# Patient Record
Sex: Male | Born: 1945 | Race: Black or African American | Hispanic: No | Marital: Married | Smoking: Former smoker
Health system: Southern US, Community
[De-identification: ages and names within clinical notes are randomized; demographics above are authoritative.]

## PROBLEM LIST (undated history)

## (undated) DIAGNOSIS — I219 Acute myocardial infarction, unspecified: Secondary | ICD-10-CM

## (undated) DIAGNOSIS — E119 Type 2 diabetes mellitus without complications: Secondary | ICD-10-CM

## (undated) DIAGNOSIS — N189 Chronic kidney disease, unspecified: Secondary | ICD-10-CM

## (undated) DIAGNOSIS — I10 Essential (primary) hypertension: Secondary | ICD-10-CM

## (undated) DIAGNOSIS — N400 Enlarged prostate without lower urinary tract symptoms: Secondary | ICD-10-CM

## (undated) DIAGNOSIS — E1110 Type 2 diabetes mellitus with ketoacidosis without coma: Secondary | ICD-10-CM

## (undated) DIAGNOSIS — M1990 Unspecified osteoarthritis, unspecified site: Secondary | ICD-10-CM

## (undated) DIAGNOSIS — J449 Chronic obstructive pulmonary disease, unspecified: Secondary | ICD-10-CM

## (undated) DIAGNOSIS — I2510 Atherosclerotic heart disease of native coronary artery without angina pectoris: Secondary | ICD-10-CM

## (undated) HISTORY — DX: Acute myocardial infarction, unspecified: I21.9

## (undated) HISTORY — DX: Type 2 diabetes mellitus with ketoacidosis without coma: E11.10

## (undated) HISTORY — PX: INGUINAL HERNIA REPAIR: SUR1180

## (undated) HISTORY — PX: CORONARY ARTERY BYPASS GRAFT: SHX141

---

## 1997-10-24 DIAGNOSIS — Q211 Atrial septal defect: Secondary | ICD-10-CM | POA: Insufficient documentation

## 2004-02-05 ENCOUNTER — Emergency Department: Payer: Self-pay | Admitting: Emergency Medicine

## 2006-09-22 ENCOUNTER — Emergency Department: Payer: Self-pay | Admitting: Emergency Medicine

## 2008-09-11 ENCOUNTER — Emergency Department: Payer: Self-pay | Admitting: Internal Medicine

## 2012-08-26 DIAGNOSIS — I2510 Atherosclerotic heart disease of native coronary artery without angina pectoris: Secondary | ICD-10-CM | POA: Insufficient documentation

## 2012-08-26 DIAGNOSIS — I10 Essential (primary) hypertension: Secondary | ICD-10-CM | POA: Insufficient documentation

## 2012-08-26 DIAGNOSIS — I152 Hypertension secondary to endocrine disorders: Secondary | ICD-10-CM | POA: Insufficient documentation

## 2012-08-26 DIAGNOSIS — E1159 Type 2 diabetes mellitus with other circulatory complications: Secondary | ICD-10-CM | POA: Insufficient documentation

## 2014-05-16 DIAGNOSIS — I2584 Coronary atherosclerosis due to calcified coronary lesion: Secondary | ICD-10-CM | POA: Diagnosis not present

## 2014-05-16 DIAGNOSIS — E1129 Type 2 diabetes mellitus with other diabetic kidney complication: Secondary | ICD-10-CM | POA: Diagnosis not present

## 2014-05-16 DIAGNOSIS — I10 Essential (primary) hypertension: Secondary | ICD-10-CM | POA: Diagnosis not present

## 2014-05-16 DIAGNOSIS — I2510 Atherosclerotic heart disease of native coronary artery without angina pectoris: Secondary | ICD-10-CM | POA: Diagnosis not present

## 2014-05-16 DIAGNOSIS — J440 Chronic obstructive pulmonary disease with acute lower respiratory infection: Secondary | ICD-10-CM | POA: Diagnosis not present

## 2014-06-13 DIAGNOSIS — I10 Essential (primary) hypertension: Secondary | ICD-10-CM | POA: Diagnosis not present

## 2014-08-15 DIAGNOSIS — E785 Hyperlipidemia, unspecified: Secondary | ICD-10-CM | POA: Diagnosis not present

## 2014-08-15 DIAGNOSIS — I2510 Atherosclerotic heart disease of native coronary artery without angina pectoris: Secondary | ICD-10-CM | POA: Diagnosis not present

## 2014-08-15 DIAGNOSIS — Z794 Long term (current) use of insulin: Secondary | ICD-10-CM | POA: Diagnosis not present

## 2014-08-15 DIAGNOSIS — E1122 Type 2 diabetes mellitus with diabetic chronic kidney disease: Secondary | ICD-10-CM | POA: Diagnosis not present

## 2014-08-15 DIAGNOSIS — J449 Chronic obstructive pulmonary disease, unspecified: Secondary | ICD-10-CM | POA: Diagnosis not present

## 2014-08-15 DIAGNOSIS — N182 Chronic kidney disease, stage 2 (mild): Secondary | ICD-10-CM | POA: Diagnosis not present

## 2014-08-15 DIAGNOSIS — Z23 Encounter for immunization: Secondary | ICD-10-CM | POA: Diagnosis not present

## 2014-08-15 DIAGNOSIS — E1129 Type 2 diabetes mellitus with other diabetic kidney complication: Secondary | ICD-10-CM | POA: Diagnosis not present

## 2014-08-15 DIAGNOSIS — E1165 Type 2 diabetes mellitus with hyperglycemia: Secondary | ICD-10-CM | POA: Diagnosis not present

## 2014-08-15 DIAGNOSIS — N400 Enlarged prostate without lower urinary tract symptoms: Secondary | ICD-10-CM | POA: Diagnosis not present

## 2014-08-15 DIAGNOSIS — I10 Essential (primary) hypertension: Secondary | ICD-10-CM | POA: Diagnosis not present

## 2014-11-21 DIAGNOSIS — E1140 Type 2 diabetes mellitus with diabetic neuropathy, unspecified: Secondary | ICD-10-CM | POA: Diagnosis not present

## 2014-11-21 DIAGNOSIS — E119 Type 2 diabetes mellitus without complications: Secondary | ICD-10-CM | POA: Diagnosis not present

## 2014-11-21 DIAGNOSIS — Z79899 Other long term (current) drug therapy: Secondary | ICD-10-CM | POA: Diagnosis not present

## 2014-11-21 DIAGNOSIS — Z23 Encounter for immunization: Secondary | ICD-10-CM | POA: Diagnosis not present

## 2014-11-21 DIAGNOSIS — I10 Essential (primary) hypertension: Secondary | ICD-10-CM | POA: Diagnosis not present

## 2015-04-09 DIAGNOSIS — J329 Chronic sinusitis, unspecified: Secondary | ICD-10-CM | POA: Diagnosis not present

## 2015-04-09 DIAGNOSIS — M47819 Spondylosis without myelopathy or radiculopathy, site unspecified: Secondary | ICD-10-CM | POA: Diagnosis not present

## 2015-04-09 DIAGNOSIS — R197 Diarrhea, unspecified: Secondary | ICD-10-CM | POA: Diagnosis not present

## 2015-04-09 DIAGNOSIS — K760 Fatty (change of) liver, not elsewhere classified: Secondary | ICD-10-CM | POA: Diagnosis not present

## 2015-04-09 DIAGNOSIS — N3289 Other specified disorders of bladder: Secondary | ICD-10-CM | POA: Diagnosis not present

## 2015-04-09 DIAGNOSIS — R109 Unspecified abdominal pain: Secondary | ICD-10-CM | POA: Diagnosis not present

## 2015-04-09 DIAGNOSIS — N390 Urinary tract infection, site not specified: Secondary | ICD-10-CM | POA: Diagnosis not present

## 2015-04-09 DIAGNOSIS — K5730 Diverticulosis of large intestine without perforation or abscess without bleeding: Secondary | ICD-10-CM | POA: Diagnosis not present

## 2015-04-09 DIAGNOSIS — N2889 Other specified disorders of kidney and ureter: Secondary | ICD-10-CM | POA: Diagnosis not present

## 2015-04-24 DIAGNOSIS — N3090 Cystitis, unspecified without hematuria: Secondary | ICD-10-CM | POA: Diagnosis not present

## 2015-04-24 DIAGNOSIS — R238 Other skin changes: Secondary | ICD-10-CM | POA: Diagnosis not present

## 2015-04-24 DIAGNOSIS — E1141 Type 2 diabetes mellitus with diabetic mononeuropathy: Secondary | ICD-10-CM | POA: Diagnosis not present

## 2015-04-24 DIAGNOSIS — I10 Essential (primary) hypertension: Secondary | ICD-10-CM | POA: Diagnosis not present

## 2015-04-24 DIAGNOSIS — Z794 Long term (current) use of insulin: Secondary | ICD-10-CM | POA: Diagnosis not present

## 2015-04-24 DIAGNOSIS — N401 Enlarged prostate with lower urinary tract symptoms: Secondary | ICD-10-CM | POA: Diagnosis not present

## 2015-04-24 DIAGNOSIS — J449 Chronic obstructive pulmonary disease, unspecified: Secondary | ICD-10-CM | POA: Diagnosis not present

## 2015-04-24 DIAGNOSIS — R413 Other amnesia: Secondary | ICD-10-CM | POA: Diagnosis not present

## 2015-04-24 DIAGNOSIS — J42 Unspecified chronic bronchitis: Secondary | ICD-10-CM | POA: Diagnosis not present

## 2015-04-24 DIAGNOSIS — R202 Paresthesia of skin: Secondary | ICD-10-CM | POA: Diagnosis not present

## 2015-04-24 DIAGNOSIS — R339 Retention of urine, unspecified: Secondary | ICD-10-CM | POA: Diagnosis not present

## 2015-06-29 ENCOUNTER — Inpatient Hospital Stay
Admission: EM | Admit: 2015-06-29 | Discharge: 2015-07-02 | DRG: 872 | Disposition: A | Discharge status: Home / Self Care | Payer: Commercial Managed Care - HMO | Attending: Internal Medicine | Admitting: Internal Medicine

## 2015-06-29 ENCOUNTER — Encounter: Payer: Self-pay | Admitting: Emergency Medicine

## 2015-06-29 ENCOUNTER — Emergency Department: Payer: Commercial Managed Care - HMO

## 2015-06-29 DIAGNOSIS — N183 Chronic kidney disease, stage 3 (moderate): Secondary | ICD-10-CM | POA: Diagnosis present

## 2015-06-29 DIAGNOSIS — A419 Sepsis, unspecified organism: Secondary | ICD-10-CM | POA: Diagnosis present

## 2015-06-29 DIAGNOSIS — E1165 Type 2 diabetes mellitus with hyperglycemia: Secondary | ICD-10-CM | POA: Diagnosis not present

## 2015-06-29 DIAGNOSIS — N400 Enlarged prostate without lower urinary tract symptoms: Secondary | ICD-10-CM | POA: Diagnosis present

## 2015-06-29 DIAGNOSIS — N390 Urinary tract infection, site not specified: Secondary | ICD-10-CM | POA: Diagnosis not present

## 2015-06-29 DIAGNOSIS — N179 Acute kidney failure, unspecified: Secondary | ICD-10-CM | POA: Diagnosis present

## 2015-06-29 DIAGNOSIS — Z87891 Personal history of nicotine dependence: Secondary | ICD-10-CM

## 2015-06-29 DIAGNOSIS — Z7982 Long term (current) use of aspirin: Secondary | ICD-10-CM

## 2015-06-29 DIAGNOSIS — R739 Hyperglycemia, unspecified: Secondary | ICD-10-CM | POA: Diagnosis not present

## 2015-06-29 DIAGNOSIS — I2510 Atherosclerotic heart disease of native coronary artery without angina pectoris: Secondary | ICD-10-CM | POA: Diagnosis present

## 2015-06-29 DIAGNOSIS — Z951 Presence of aortocoronary bypass graft: Secondary | ICD-10-CM | POA: Diagnosis not present

## 2015-06-29 DIAGNOSIS — I129 Hypertensive chronic kidney disease with stage 1 through stage 4 chronic kidney disease, or unspecified chronic kidney disease: Secondary | ICD-10-CM | POA: Diagnosis not present

## 2015-06-29 DIAGNOSIS — E871 Hypo-osmolality and hyponatremia: Secondary | ICD-10-CM | POA: Diagnosis present

## 2015-06-29 DIAGNOSIS — Z8249 Family history of ischemic heart disease and other diseases of the circulatory system: Secondary | ICD-10-CM

## 2015-06-29 DIAGNOSIS — J449 Chronic obstructive pulmonary disease, unspecified: Secondary | ICD-10-CM | POA: Diagnosis not present

## 2015-06-29 DIAGNOSIS — E860 Dehydration: Secondary | ICD-10-CM | POA: Diagnosis present

## 2015-06-29 DIAGNOSIS — R531 Weakness: Secondary | ICD-10-CM | POA: Diagnosis not present

## 2015-06-29 DIAGNOSIS — R7881 Bacteremia: Secondary | ICD-10-CM | POA: Diagnosis present

## 2015-06-29 DIAGNOSIS — N3000 Acute cystitis without hematuria: Secondary | ICD-10-CM | POA: Diagnosis not present

## 2015-06-29 DIAGNOSIS — Z794 Long term (current) use of insulin: Secondary | ICD-10-CM | POA: Diagnosis not present

## 2015-06-29 DIAGNOSIS — E1122 Type 2 diabetes mellitus with diabetic chronic kidney disease: Secondary | ICD-10-CM | POA: Diagnosis not present

## 2015-06-29 DIAGNOSIS — A4189 Other specified sepsis: Secondary | ICD-10-CM | POA: Diagnosis not present

## 2015-06-29 DIAGNOSIS — N189 Chronic kidney disease, unspecified: Secondary | ICD-10-CM

## 2015-06-29 DIAGNOSIS — Z79899 Other long term (current) drug therapy: Secondary | ICD-10-CM | POA: Diagnosis not present

## 2015-06-29 HISTORY — DX: Chronic kidney disease, unspecified: N18.9

## 2015-06-29 HISTORY — DX: Atherosclerotic heart disease of native coronary artery without angina pectoris: I25.10

## 2015-06-29 HISTORY — DX: Chronic obstructive pulmonary disease, unspecified: J44.9

## 2015-06-29 HISTORY — DX: Benign prostatic hyperplasia without lower urinary tract symptoms: N40.0

## 2015-06-29 HISTORY — DX: Type 2 diabetes mellitus without complications: E11.9

## 2015-06-29 HISTORY — DX: Essential (primary) hypertension: I10

## 2015-06-29 LAB — CBC WITH DIFFERENTIAL/PLATELET
BASOS PCT: 0 %
Basophils Absolute: 0.1 10*3/uL (ref 0–0.1)
EOS ABS: 0 10*3/uL (ref 0–0.7)
EOS PCT: 0 %
HCT: 36.4 % — ABNORMAL LOW (ref 40.0–52.0)
HEMOGLOBIN: 12.3 g/dL — AB (ref 13.0–18.0)
Lymphocytes Relative: 5 %
Lymphs Abs: 1.2 10*3/uL (ref 1.0–3.6)
MCH: 29.2 pg (ref 26.0–34.0)
MCHC: 33.9 g/dL (ref 32.0–36.0)
MCV: 86.2 fL (ref 80.0–100.0)
Monocytes Absolute: 2 10*3/uL — ABNORMAL HIGH (ref 0.2–1.0)
Monocytes Relative: 8 %
NEUTROS PCT: 87 %
Neutro Abs: 20.3 10*3/uL — ABNORMAL HIGH (ref 1.4–6.5)
PLATELETS: 289 10*3/uL (ref 150–440)
RBC: 4.22 MIL/uL — ABNORMAL LOW (ref 4.40–5.90)
RDW: 13 % (ref 11.5–14.5)
WBC: 23.5 10*3/uL — AB (ref 3.8–10.6)

## 2015-06-29 LAB — BASIC METABOLIC PANEL
ANION GAP: 9 (ref 5–15)
BUN: 51 mg/dL — ABNORMAL HIGH (ref 6–20)
CO2: 23 mmol/L (ref 22–32)
Calcium: 8.3 mg/dL — ABNORMAL LOW (ref 8.9–10.3)
Chloride: 94 mmol/L — ABNORMAL LOW (ref 101–111)
Creatinine, Ser: 2.55 mg/dL — ABNORMAL HIGH (ref 0.61–1.24)
GFR calc Af Amer: 28 mL/min — ABNORMAL LOW (ref >60)
GFR, EST NON AFRICAN AMERICAN: 24 mL/min — AB (ref >60)
GLUCOSE: 410 mg/dL — AB (ref 65–99)
POTASSIUM: 3.9 mmol/L (ref 3.5–5.1)
Sodium: 126 mmol/L — ABNORMAL LOW (ref 135–145)

## 2015-06-29 LAB — URINALYSIS COMPLETE WITH MICROSCOPIC (ARMC ONLY)
Bilirubin Urine: NEGATIVE
Ketones, ur: NEGATIVE mg/dL
NITRITE: NEGATIVE
Protein, ur: NEGATIVE mg/dL
SPECIFIC GRAVITY, URINE: 1.009 (ref 1.005–1.030)
pH: 5 (ref 5.0–8.0)

## 2015-06-29 LAB — TSH: TSH: 0.363 u[IU]/mL (ref 0.350–4.500)

## 2015-06-29 LAB — TROPONIN I: Troponin I: <0.03 ng/mL (ref <0.031)

## 2015-06-29 LAB — LACTIC ACID, PLASMA: LACTIC ACID, VENOUS: 1.5 mmol/L (ref 0.5–2.0)

## 2015-06-29 LAB — CK: Total CK: 605 U/L — ABNORMAL HIGH (ref 49–397)

## 2015-06-29 MED ORDER — ACETAMINOPHEN 325 MG PO TABS
650.0000 mg | ORAL_TABLET | Freq: Four times a day (QID) | ORAL | Status: DC | PRN
  Administered 2015-06-30 – 2015-07-01 (×3): 650 mg via ORAL
  Filled 2015-06-29 (×3): qty 2

## 2015-06-29 MED ORDER — SODIUM CHLORIDE 0.9 % IV BOLUS (SEPSIS)
1000.0000 mL | INTRAVENOUS | Status: DC
  Administered 2015-06-29 – 2015-06-30 (×2): 1000 mL via INTRAVENOUS

## 2015-06-29 MED ORDER — ONDANSETRON HCL 4 MG/2ML IJ SOLN: 4.0000 mg | Freq: Four times a day (QID) | INTRAMUSCULAR | Status: DC | PRN

## 2015-06-29 MED ORDER — ONDANSETRON HCL 4 MG PO TABS: 4.0000 mg | ORAL_TABLET | Freq: Four times a day (QID) | ORAL | Status: DC | PRN

## 2015-06-29 MED ORDER — SODIUM CHLORIDE 0.9 % IV SOLN
INTRAVENOUS | Status: DC
  Administered 2015-06-30 – 2015-07-02 (×6): via INTRAVENOUS

## 2015-06-29 MED ORDER — HEPARIN SODIUM (PORCINE) 5000 UNIT/ML IJ SOLN
5000.0000 [IU] | Freq: Three times a day (TID) | INTRAMUSCULAR | Status: DC
  Administered 2015-06-30 – 2015-07-02 (×8): 5000 [IU] via SUBCUTANEOUS
  Filled 2015-06-29 (×8): qty 1

## 2015-06-29 MED ORDER — SENNOSIDES-DOCUSATE SODIUM 8.6-50 MG PO TABS: 1.0000 | ORAL_TABLET | Freq: Every evening | ORAL | Status: DC | PRN

## 2015-06-29 MED ORDER — DEXTROSE 5 % IV SOLN
2.0000 g | Freq: Once | INTRAVENOUS | Status: AC
  Administered 2015-06-30: 2 g via INTRAVENOUS
  Filled 2015-06-29: qty 2

## 2015-06-29 MED ORDER — SODIUM CHLORIDE 0.9 % IV BOLUS (SEPSIS)
1000.0000 mL | Freq: Once | INTRAVENOUS | Status: AC
  Administered 2015-06-29: 1000 mL via INTRAVENOUS

## 2015-06-29 MED ORDER — SODIUM CHLORIDE 0.9% FLUSH
3.0000 mL | Freq: Two times a day (BID) | INTRAVENOUS | Status: DC
  Administered 2015-06-30 – 2015-07-02 (×3): 3 mL via INTRAVENOUS

## 2015-06-29 MED ORDER — ACETAMINOPHEN 650 MG RE SUPP: 650.0000 mg | Freq: Four times a day (QID) | RECTAL | Status: DC | PRN

## 2015-06-29 NOTE — Progress Notes (Signed)
Pharmacy Antibiotic Note  LEANARD ISHEE is a 70 y.o. male admitted on 06/29/2015 with UTI.  Pharmacy has been consulted for cefepime dosing.  Plan: Cefepime 2 gm IV x 1 in ED followed by cefepime 1 gm IV Q12H.   Height: 5\' 11"  (180.3 cm) Weight: 208 lb 12.4 oz (94.7 kg) IBW/kg (Calculated) : 75.3  Temp (24hrs), Avg:100.2 F (37.9 C), Min:100.2 F (37.9 C), Max:100.2 F (37.9 C)   Recent Labs Lab 06/29/15 2139  WBC 23.5*  CREATININE 2.55*    Estimated Creatinine Clearance: 31.2 mL/min (by C-G formula based on Cr of 2.55).    No Known Allergies  Thank you for allowing pharmacy to be a part of this patient's care.  Carola Frost, Pharm.D., BCPS Clinical Pharmacist 06/29/2015 10:52 PM

## 2015-06-29 NOTE — ED Notes (Signed)
Patient transported to X-ray 

## 2015-06-29 NOTE — ED Notes (Signed)
Pt comes into the ED via EMS c/o generalized weakness. Patient has been mowing all day and only consumes 1 gl;ass of water.  Patient has h/o HTN, DM, COPD.  CBG 364, 80HR, 122/60, 101.9 temp.  Patient denies any chest pain, dizziness, N/V.  Patient has no complaints other than the generalized weakness.

## 2015-06-29 NOTE — ED Provider Notes (Addendum)
[facility] Emergency Department Provider Note  ____________________________________________  Time seen: Seen upon arrival to the emergency department  I have reviewed the triage vital signs and the nursing notes.   HISTORY  Chief Complaint Weakness   HPI Benjamin Chan is a 70 y.o. male with a history of diabetes and hypertension who is presenting to the emergency department with weakness that started today. The patient denies any pain. Says he does have a mild sore throat. Denies any burning with urination but says that he was treated for urinary tract infection about one month ago at [facility] [place].Patient says that it was his wife that insisted that he come to the emergency department today because of his weakness. He also says that he was out mowing the yard all day and only had one glass of water.   Past Medical History  Diagnosis Date  . Diabetes mellitus without complication (HCC)   . Hypertension   . COPD (chronic obstructive pulmonary disease) (HCC)     There are no active problems to display for this patient.   Past Surgical History  Procedure Laterality Date  . Coronary artery bypass graft    . Inguinal hernia repair      right    No current outpatient prescriptions on file.  Allergies Review of patient's allergies indicates no known allergies.  No family history on file.  Social History Social History  Substance Use Topics  . Smoking status: Former Games developer  . Smokeless tobacco: None  . Alcohol Use: No    Review of Systems Constitutional: No fever/chills Eyes: No visual changes. ENT: No sore throat. Cardiovascular: Denies chest pain. Respiratory: Denies shortness of breath. Gastrointestinal: No abdominal pain.  No nausea, no vomiting.  No diarrhea.  No constipation. Genitourinary: Negative for dysuria. Musculoskeletal: Negative for back pain. Skin: Negative for rash. Neurological: Negative for headaches,  focal weakness or numbness.  10-point ROS otherwise negative.  ____________________________________________   PHYSICAL EXAM:  VITAL SIGNS: ED Triage Vitals  Enc Vitals Group     BP 06/29/15 2143 123/73 mmHg     Pulse Rate 06/29/15 2136 83     Resp 06/29/15 2136 17     Temp 06/29/15 2136 100.2 F (37.9 C)     Temp Source 06/29/15 2136 Oral     SpO2 06/29/15 2136 94 %     Weight 06/29/15 2136 208 lb 12.4 oz (94.7 kg)     Height 06/29/15 2136 5\' 11"  (1.803 m)     Head Cir --      Peak Flow --      Pain Score --      Pain Loc --      Pain Edu? --      Excl. in GC? --     Constitutional: Alert and oriented. Well appearing and in no acute distress. Eyes: Conjunctivae are normal. PERRL. EOMI. Head: Atraumatic. Nose: No congestion/rhinnorhea. Mouth/Throat: Mucous membranes are moist.  Oropharynx non-erythematous. Neck: No stridor.   Cardiovascular: Normal rate, regular rhythm. Grossly normal heart sounds.  Good peripheral circulation. Respiratory: Normal respiratory effort.  No retractions. Lungs CTAB. Gastrointestinal: Soft and nontender. No distention. No abdominal bruits. No CVA tenderness. Musculoskeletal: No lower extremity tenderness nor edema.  No joint effusions. Neurologic:  Normal speech and language. No gross focal neurologic deficits are appreciated. Skin:  Skin is warm, dry and intact. No rash noted. Psychiatric: Mood and affect are normal. Speech and behavior are normal.  ____________________________________________   LABS (  all labs ordered are listed, but only abnormal results are displayed)  Labs Reviewed  CBC WITH DIFFERENTIAL/PLATELET - Abnormal; Notable for the following:    WBC 23.5 (*)    RBC 4.22 (*)    Hemoglobin 12.3 (*)    HCT 36.4 (*)    Neutro Abs 20.3 (*)    Monocytes Absolute 2.0 (*)    All other components within normal limits  BASIC METABOLIC PANEL - Abnormal; Notable for the following:    Sodium 126 (*)    Chloride 94 (*)    Glucose,  Bld 410 (*)    BUN 51 (*)    Creatinine, Ser 2.55 (*)    Calcium 8.3 (*)    GFR calc non Af Amer 24 (*)    GFR calc Af Amer 28 (*)    All other components within normal limits  CK - Abnormal; Notable for the following:    Total CK 605 (*)    All other components within normal limits  URINALYSIS COMPLETEWITH MICROSCOPIC ([facility] ONLY) - Abnormal; Notable for the following:    Color, Urine YELLOW (*)    APPearance HAZY (*)    Glucose, UA >500 (*)    Hgb urine dipstick 2+ (*)    Leukocytes, UA 3+ (*)    Bacteria, UA MANY (*)    Squamous Epithelial / LPF 0-5 (*)    All other components within normal limits  CULTURE, BLOOD (ROUTINE X 2)  CULTURE, BLOOD (ROUTINE X 2)  URINE CULTURE  TROPONIN I  TSH  LACTIC ACID, PLASMA  LACTIC ACID, PLASMA   ____________________________________________  EKG  ED ECG REPORT I, Schaevitz,  Teena Irani, the attending physician, personally viewed and interpreted this ECG.   Date: 06/29/2015  EKG Time: 2139  Rate: 82  Rhythm: normal sinus rhythm  Axis: Normal axis  Intervals:Incomplete left bundle branch block.  ST&T Change: No ST segment elevation or depression. No abnormal T-wave inversion. Machine read as ST elevations in V1 through V4 but this is likely secondary to the baseline.  ____________________________________________  RADIOLOGY   IMPRESSION: No active cardiopulmonary disease.   Electronically Signed By: Ellery Plunk M.D. On: 06/29/2015 22:22 ____________________________________________   PROCEDURES  ____________________________________________   INITIAL IMPRESSION / ASSESSMENT AND PLAN / ED COURSE  Pertinent labs & imaging results that were available during my care of the patient were reviewed by me and considered in my medical decision making (see chart for details).  ----------------------------------------- 11:03 PM on 06/29/2015 -----------------------------------------  Patient with urine consistent with  infection. Patient with likely sepsis secondary to his UTI. Sepsis alert called. Patient to be admitted to the hospital. I discussed the plan as well as the diagnosis with the family as well as the patient was understanding willing to comply. I also discussed the patient's acute renal failure. ____________________________________________   FINAL CLINICAL IMPRESSION(S) / ED DIAGNOSES  UTI. Sepsis. Acute renal failure. Hyponatremia.    Myrna Blazer, MD 06/29/15 [phone]  Signed out to Dr. Tobi Bastos.    Myrna Blazer, MD 06/29/15 [phone]

## 2015-06-29 NOTE — ED Notes (Signed)
MD at bedside. 

## 2015-06-29 NOTE — ED Notes (Signed)
MD scheavitz at bedside.

## 2015-06-30 ENCOUNTER — Inpatient Hospital Stay: Payer: Commercial Managed Care - HMO

## 2015-06-30 ENCOUNTER — Encounter: Payer: Self-pay | Admitting: Internal Medicine

## 2015-06-30 LAB — BLOOD CULTURE ID PANEL (REFLEXED)
Acinetobacter baumannii: NOT DETECTED
CANDIDA ALBICANS: NOT DETECTED
CANDIDA TROPICALIS: NOT DETECTED
Candida glabrata: NOT DETECTED
Candida krusei: NOT DETECTED
Candida parapsilosis: NOT DETECTED
Carbapenem resistance: NOT DETECTED
ENTEROBACTER CLOACAE COMPLEX: NOT DETECTED
ENTEROBACTERIACEAE SPECIES: DETECTED — AB
ENTEROCOCCUS SPECIES: NOT DETECTED
Escherichia coli: NOT DETECTED
HAEMOPHILUS INFLUENZAE: NOT DETECTED
Klebsiella oxytoca: NOT DETECTED
Klebsiella pneumoniae: DETECTED — AB
Listeria monocytogenes: NOT DETECTED
Methicillin resistance: NOT DETECTED
Neisseria meningitidis: NOT DETECTED
PSEUDOMONAS AERUGINOSA: NOT DETECTED
Proteus species: NOT DETECTED
STREPTOCOCCUS AGALACTIAE: NOT DETECTED
STREPTOCOCCUS PYOGENES: NOT DETECTED
STREPTOCOCCUS SPECIES: NOT DETECTED
Serratia marcescens: NOT DETECTED
Staphylococcus aureus (BCID): NOT DETECTED
Staphylococcus species: NOT DETECTED
Streptococcus pneumoniae: NOT DETECTED
VANCOMYCIN RESISTANCE: NOT DETECTED

## 2015-06-30 LAB — COMPREHENSIVE METABOLIC PANEL
ALT: 13 U/L — ABNORMAL LOW (ref 17–63)
ANION GAP: 7 (ref 5–15)
AST: 17 U/L (ref 15–41)
Albumin: 2.9 g/dL — ABNORMAL LOW (ref 3.5–5.0)
Alkaline Phosphatase: 71 U/L (ref 38–126)
BILIRUBIN TOTAL: 0.8 mg/dL (ref 0.3–1.2)
BUN: 48 mg/dL — AB (ref 6–20)
CO2: 21 mmol/L — ABNORMAL LOW (ref 22–32)
Calcium: 7.5 mg/dL — ABNORMAL LOW (ref 8.9–10.3)
Chloride: 99 mmol/L — ABNORMAL LOW (ref 101–111)
Creatinine, Ser: 2.27 mg/dL — ABNORMAL HIGH (ref 0.61–1.24)
GFR, EST AFRICAN AMERICAN: 32 mL/min — AB (ref >60)
GFR, EST NON AFRICAN AMERICAN: 27 mL/min — AB (ref >60)
Glucose, Bld: 473 mg/dL — ABNORMAL HIGH (ref 65–99)
POTASSIUM: 4.3 mmol/L (ref 3.5–5.1)
Sodium: 127 mmol/L — ABNORMAL LOW (ref 135–145)
TOTAL PROTEIN: 6.3 g/dL — AB (ref 6.5–8.1)

## 2015-06-30 LAB — CBC WITH DIFFERENTIAL/PLATELET
Basophils Absolute: 0.1 10*3/uL (ref 0–0.1)
Basophils Relative: 0 %
EOS PCT: 0 %
Eosinophils Absolute: 0 10*3/uL (ref 0–0.7)
HEMATOCRIT: 34.7 % — AB (ref 40.0–52.0)
Hemoglobin: 11.6 g/dL — ABNORMAL LOW (ref 13.0–18.0)
LYMPHS PCT: 7 %
Lymphs Abs: 1.4 10*3/uL (ref 1.0–3.6)
MCH: 29.7 pg (ref 26.0–34.0)
MCHC: 33.6 g/dL (ref 32.0–36.0)
MCV: 88.5 fL (ref 80.0–100.0)
MONO ABS: 1.3 10*3/uL — AB (ref 0.2–1.0)
MONOS PCT: 6 %
NEUTROS ABS: 18.2 10*3/uL — AB (ref 1.4–6.5)
Neutrophils Relative %: 87 %
Platelets: 255 10*3/uL (ref 150–440)
RBC: 3.92 MIL/uL — ABNORMAL LOW (ref 4.40–5.90)
RDW: 13.4 % (ref 11.5–14.5)
WBC: 20.9 10*3/uL — ABNORMAL HIGH (ref 3.8–10.6)

## 2015-06-30 LAB — GLUCOSE, CAPILLARY
GLUCOSE-CAPILLARY: 474 mg/dL — AB (ref 65–99)
GLUCOSE-CAPILLARY: 267 mg/dL — AB (ref 65–99)
Glucose-Capillary: 516 mg/dL — ABNORMAL HIGH (ref 65–99)
Glucose-Capillary: 529 mg/dL — ABNORMAL HIGH (ref 65–99)
Glucose-Capillary: 398 mg/dL — ABNORMAL HIGH (ref 65–99)
Glucose-Capillary: 222 mg/dL — ABNORMAL HIGH (ref 65–99)

## 2015-06-30 LAB — APTT: aPTT: 31 seconds (ref 24–36)

## 2015-06-30 LAB — PROTIME-INR
INR: 1.34
Prothrombin Time: 16.7 seconds — ABNORMAL HIGH (ref 11.4–15.0)

## 2015-06-30 LAB — PROCALCITONIN: Procalcitonin: 2.66 ng/mL

## 2015-06-30 LAB — LACTIC ACID, PLASMA: LACTIC ACID, VENOUS: 1.6 mmol/L (ref 0.5–2.0)

## 2015-06-30 MED ORDER — DEXTROSE 5 % IV SOLN: 1.0000 g | Freq: Two times a day (BID) | INTRAVENOUS | Status: DC

## 2015-06-30 MED ORDER — CEFTRIAXONE SODIUM 2 G IJ SOLR
2.0000 g | INTRAMUSCULAR | Status: DC
  Administered 2015-06-30 – 2015-07-01 (×2): 2 g via INTRAVENOUS
  Filled 2015-06-30 (×4): qty 2

## 2015-06-30 MED ORDER — FINASTERIDE 5 MG PO TABS
5.0000 mg | ORAL_TABLET | Freq: Every day | ORAL | Status: DC
  Administered 2015-06-30 – 2015-07-02 (×3): 5 mg via ORAL
  Filled 2015-06-30 (×3): qty 1

## 2015-06-30 MED ORDER — PIPERACILLIN-TAZOBACTAM 3.375 G IVPB
3.3750 g | Freq: Three times a day (TID) | INTRAVENOUS | Status: DC
  Administered 2015-06-30: 3.375 g via INTRAVENOUS
  Filled 2015-06-30 (×5): qty 50

## 2015-06-30 MED ORDER — INSULIN ASPART 100 UNIT/ML ~~LOC~~ SOLN
0.0000 [IU] | Freq: Every day | SUBCUTANEOUS | Status: DC
  Administered 2015-06-30 – 2015-07-01 (×2): 2 [IU] via SUBCUTANEOUS
  Filled 2015-06-30 (×2): qty 2

## 2015-06-30 MED ORDER — INSULIN ASPART 100 UNIT/ML ~~LOC~~ SOLN
18.0000 [IU] | Freq: Once | SUBCUTANEOUS | Status: AC
  Administered 2015-06-30: 11:00:00 18 [IU] via SUBCUTANEOUS
  Filled 2015-06-30: qty 18

## 2015-06-30 MED ORDER — INSULIN GLARGINE 100 UNIT/ML ~~LOC~~ SOLN
20.0000 [IU] | Freq: Every day | SUBCUTANEOUS | Status: DC
  Administered 2015-06-30 – 2015-07-01 (×2): 20 [IU] via SUBCUTANEOUS
  Filled 2015-06-30 (×2): qty 0.2

## 2015-06-30 MED ORDER — TIOTROPIUM BROMIDE MONOHYDRATE 18 MCG IN CAPS
18.0000 ug | ORAL_CAPSULE | Freq: Every day | RESPIRATORY_TRACT | Status: DC
  Administered 2015-06-30 – 2015-07-02 (×4): 18 ug via RESPIRATORY_TRACT
  Filled 2015-06-30: qty 5

## 2015-06-30 MED ORDER — INSULIN GLARGINE 100 UNIT/ML ~~LOC~~ SOLN
20.0000 [IU] | Freq: Every day | SUBCUTANEOUS | Status: DC
  Filled 2015-06-30: qty 0.2

## 2015-06-30 MED ORDER — METOPROLOL TARTRATE 25 MG PO TABS: 25.0000 mg | ORAL_TABLET | Freq: Every day | ORAL | Status: DC

## 2015-06-30 MED ORDER — DOXAZOSIN MESYLATE 2 MG PO TABS
2.0000 mg | ORAL_TABLET | Freq: Every day | ORAL | Status: DC
  Filled 2015-06-30: qty 1

## 2015-06-30 MED ORDER — INSULIN ASPART 100 UNIT/ML ~~LOC~~ SOLN
0.0000 [IU] | Freq: Three times a day (TID) | SUBCUTANEOUS | Status: DC
  Administered 2015-06-30: 17:00:00 8 [IU] via SUBCUTANEOUS
  Administered 2015-07-01 (×2): 5 [IU] via SUBCUTANEOUS
  Administered 2015-07-01: 13:00:00 8 [IU] via SUBCUTANEOUS
  Administered 2015-07-02: 08:00:00 3 [IU] via SUBCUTANEOUS
  Administered 2015-07-02: 12:00:00 8 [IU] via SUBCUTANEOUS
  Filled 2015-06-30: qty 3
  Filled 2015-06-30: qty 8
  Filled 2015-06-30: qty 5
  Filled 2015-06-30 (×2): qty 8
  Filled 2015-06-30: qty 5

## 2015-06-30 MED ORDER — SODIUM CHLORIDE 0.9 % IV BOLUS (SEPSIS): 1000.0000 mL | INTRAVENOUS | Status: DC

## 2015-06-30 MED ORDER — ASPIRIN EC 81 MG PO TBEC
81.0000 mg | DELAYED_RELEASE_TABLET | Freq: Every day | ORAL | Status: DC
  Administered 2015-06-30 – 2015-07-02 (×3): 81 mg via ORAL
  Filled 2015-06-30 (×3): qty 1

## 2015-06-30 MED ORDER — VANCOMYCIN HCL IN DEXTROSE 1-5 GM/200ML-% IV SOLN
1000.0000 mg | INTRAVENOUS | Status: DC
  Administered 2015-06-30: 1000 mg via INTRAVENOUS
  Filled 2015-06-30 (×3): qty 200

## 2015-06-30 MED ORDER — ALBUTEROL SULFATE (2.5 MG/3ML) 0.083% IN NEBU
3.0000 mL | INHALATION_SOLUTION | Freq: Every day | RESPIRATORY_TRACT | Status: DC
Start: 2015-06-30 — End: 2015-07-01
  Administered 2015-06-30 – 2015-07-01 (×2): 3 mL via RESPIRATORY_TRACT
  Filled 2015-06-30: qty 3

## 2015-06-30 MED ORDER — VANCOMYCIN HCL IN DEXTROSE 1-5 GM/200ML-% IV SOLN
1000.0000 mg | Freq: Once | INTRAVENOUS | Status: AC
  Administered 2015-06-30: 01:00:00 1000 mg via INTRAVENOUS
  Filled 2015-06-30: qty 200

## 2015-06-30 MED ORDER — PIPERACILLIN-TAZOBACTAM 3.375 G IVPB 30 MIN
3.3750 g | Freq: Once | INTRAVENOUS | Status: AC
  Administered 2015-06-30: 3.375 g via INTRAVENOUS
  Filled 2015-06-30: qty 50

## 2015-06-30 MED ORDER — SIMVASTATIN 40 MG PO TABS
40.0000 mg | ORAL_TABLET | Freq: Every day | ORAL | Status: DC
  Administered 2015-06-30 – 2015-07-02 (×3): 40 mg via ORAL
  Filled 2015-06-30 (×3): qty 1

## 2015-06-30 NOTE — H&P (Signed)
[facility] [place] [facility]   PATIENT NAME: Benjamin Chan    MR#:  [id number]  DATE OF BIRTH:  April 06, 1945  DATE OF ADMISSION:  06/29/2015  PRIMARY CARE PHYSICIAN: Vonita Moss, MD   REQUESTING/REFERRING PHYSICIAN:   CHIEF COMPLAINT:   Chief Complaint  Patient presents with  . Weakness    HISTORY OF PRESENT ILLNESS: Benjamin Chan  is a 70 y.o. male with a known history of hypertension, diabetes mellitus2 COPD, coronary artery disease presented to the emergency room with generalized weakness today. Patient more the lawn yesterday for his yard and he did not drink enough fluids. He was feeling weak and tired. No history of any vomiting. No history of any chest pain. No history of any shortness of breath. Had some low-grade fever. Patient was evaluated in the emergency room was found to be dry and dehydrated with renal failure. Patient WBC count was also elevated and urine showed infection. Patient was resuscitated with IV fluids and hospitalist service was consulted for admission. No history of recent travel or sick contacts at home. No history of orthopnea. No history of any syncope or seizure.  PAST MEDICAL HISTORY:   Past Medical History  Diagnosis Date  . Diabetes mellitus without complication (HCC)   . Hypertension   . COPD (chronic obstructive pulmonary disease) (HCC)   . CAD (coronary artery disease)     PAST SURGICAL HISTORY: Past Surgical History  Procedure Laterality Date  . Coronary artery bypass graft    . Inguinal hernia repair      right    SOCIAL HISTORY:  Social History  Substance Use Topics  . Smoking status: Former Games developer  . Smokeless tobacco: Not on file  . Alcohol Use: No    FAMILY HISTORY:  Family History  Problem Relation Age of Onset  . Hypertension Father     DRUG ALLERGIES: No Known Allergies  REVIEW OF SYSTEMS:   CONSTITUTIONAL: low grade fever, has weakness.  EYES: No blurred or double vision.   EARS, NOSE, AND THROAT: No tinnitus or ear pain.  RESPIRATORY: No cough, shortness of breath, wheezing or hemoptysis.  CARDIOVASCULAR: No chest pain, orthopnea, edema.  GASTROINTESTINAL: No nausea, vomiting, diarrhea or abdominal pain.  GENITOURINARY: has dysuria, hematuria.  ENDOCRINE: No polyuria, nocturia,  HEMATOLOGY: No anemia, easy bruising or bleeding SKIN: No rash or lesion. MUSCULOSKELETAL: No joint pain or arthritis.   NEUROLOGIC: No tingling, numbness, weakness.  PSYCHIATRY: No anxiety or depression.   MEDICATIONS AT HOME:  Prior to Admission medications   Medication Sig Start Date End Date Taking? Authorizing Provider  acetaminophen (TYLENOL) 325 MG tablet Take 650 mg by mouth every 6 (six) hours as needed.   Yes Historical Provider, MD  aspirin 81 MG tablet Take 81 mg by mouth daily. 02/19/10  Yes Historical Provider, MD  chlorthalidone (HYGROTON) 25 MG tablet Take 25 mg by mouth daily.   Yes Historical Provider, MD  doxazosin (CARDURA) 2 MG tablet Take 2 mg by mouth daily.   Yes Historical Provider, MD  enalapril (VASOTEC) 20 MG tablet Take 1 tablet by mouth daily. 04/30/15  Yes Historical Provider, MD  finasteride (PROSCAR) 5 MG tablet Take 1 tablet by mouth daily. 03/27/15  Yes Historical Provider, MD  insulin glargine (LANTUS) 100 UNIT/ML injection Inject 50 Units into the skin at bedtime.   Yes Historical Provider, MD  metoprolol tartrate (LOPRESSOR) 25 MG tablet Take 1 tablet by mouth daily. 04/04/15  Yes Historical Provider, MD  nitroGLYCERIN (NITROSTAT) 0.4 MG SL tablet Place 0.4 mg under the tongue. 05/16/14  Yes Historical Provider, MD  simvastatin (ZOCOR) 40 MG tablet Take 40 mg by mouth daily. 05/16/14  Yes Historical Provider, MD  tiotropium (SPIRIVA) 18 MCG inhalation capsule Place 18 mcg into inhaler and inhale daily.   Yes Historical Provider, MD  VENTOLIN HFA 108 (90 Base) MCG/ACT inhaler Inhale 2 puffs into the lungs daily. 04/25/15  Yes Historical Provider, MD       PHYSICAL EXAMINATION:   VITAL SIGNS: Blood pressure 106/52, pulse 85, temperature 100.2 F (37.9 C), temperature source Oral, resp. rate 21, height 5\' 11"  (1.803 m), weight 94.7 kg (208 lb 12.4 oz), SpO2 94 %.  GENERAL:  70 y.o.-year-old patient lying in the bed with no acute distress.  EYES: Pupils equal, round, reactive to light and accommodation. No scleral icterus. Extraocular muscles intact.  HEENT: Head atraumatic, normocephalic. Oropharynx dry and nasopharynx clear.  NECK:  Supple, no jugular venous distention. No thyroid enlargement, no tenderness.  LUNGS: Normal breath sounds bilaterally, no wheezing, rales,rhonchi or crepitation. No use of accessory muscles of respiration.  CARDIOVASCULAR: S1, S2 normal. No murmurs, rubs, or gallops.  ABDOMEN: Soft, nontender, nondistended. Bowel sounds present. No organomegaly or mass.  EXTREMITIES: No pedal edema, cyanosis, or clubbing.  NEUROLOGIC: Cranial nerves II through XII are intact. Muscle strength 5/5 in all extremities. Sensation intact. Gait normal. PSYCHIATRIC: The patient is alert and oriented x 3.  SKIN: No obvious rash, lesion, or ulcer.   LABORATORY PANEL:   CBC  Recent Labs Lab 06/29/15 2139  WBC 23.5*  HGB 12.3*  HCT 36.4*  PLT 289  MCV 86.2  MCH 29.2  MCHC 33.9  RDW 13.0  LYMPHSABS 1.2  MONOABS 2.0*  EOSABS 0.0  BASOSABS 0.1   ------------------------------------------------------------------------------------------------------------------  Chemistries   Recent Labs Lab 06/29/15 2139  NA 126*  K 3.9  CL 94*  CO2 23  GLUCOSE 410*  BUN 51*  CREATININE 2.55*  CALCIUM 8.3*   ------------------------------------------------------------------------------------------------------------------ estimated creatinine clearance is 31.2 mL/min (by C-G formula based on Cr of 2.55). ------------------------------------------------------------------------------------------------------------------  Recent  Labs  06/29/15 2139  TSH 0.363     Coagulation profile No results for input(s): INR, PROTIME in the last 168 hours. ------------------------------------------------------------------------------------------------------------------- No results for input(s): DDIMER in the last 72 hours. -------------------------------------------------------------------------------------------------------------------  Cardiac Enzymes  Recent Labs Lab 06/29/15 2139  TROPONINI <0.03   ------------------------------------------------------------------------------------------------------------------ Invalid input(s): POCBNP  ---------------------------------------------------------------------------------------------------------------  Urinalysis    Component Value Date/Time   COLORURINE YELLOW* 06/29/2015 2241   APPEARANCEUR HAZY* 06/29/2015 2241   LABSPEC 1.009 06/29/2015 2241   PHURINE 5.0 06/29/2015 2241   GLUCOSEU >500* 06/29/2015 2241   HGBUR 2+* 06/29/2015 2241   BILIRUBINUR NEGATIVE 06/29/2015 2241   KETONESUR NEGATIVE 06/29/2015 2241   PROTEINUR NEGATIVE 06/29/2015 2241   NITRITE NEGATIVE 06/29/2015 2241   LEUKOCYTESUR 3+* 06/29/2015 2241     RADIOLOGY: Dg Chest 2 View  06/29/2015  CLINICAL DATA:  Generalized weakness. EXAM: CHEST  2 VIEW COMPARISON:  None. 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 Borderline heart size. Mild aortic tortuosity. The lungs are clear. Pulmonary vasculature is normal. There are no pleural effusions. Hilar and mediastinal contours are unremarkable. IMPRESSION: No active cardiopulmonary disease. Electronically Signed   By: Ellery Plunk M.D.   On: 06/29/2015 22:22    EKG: Orders placed or performed during the hospital encounter of 06/29/15  . ED EKG  . ED EKG  . EKG 12-Lead  . EKG 12-Lead  IMPRESSION AND PLAN: 70 year old male patient with history of coronary artery disease, hypertension, type 2 diabetes mellitus, COPD presented to the  emergency room with generalized weakness. Admitting diagnosis 1. Acute kidney injury 2. Dehydration 3. Sepsis 4. Urinary tract infection 5. Hyponatremia Treatment plan Admit patient to medical floor IV fluid resuscitation based on sepsis protocol Start patient on IV vancomycin and IV Zosyn antibiotic Follow-up sodium level and cultures Follow renal function DVT prophylaxis with subcutaneous heparin Supportive care  All the records are reviewed and case discussed with ED provider. Management plans discussed with the patient, family and they are in agreement.  CODE STATUS:FULL    Code Status Orders        Start     Ordered   06/29/15 2349  Full code   Continuous     06/29/15 2349    Code Status History    Date Active Date Inactive Code Status Order ID Comments User Context   This patient has a current code status but no historical code status.       TOTAL TIME TAKING CARE OF THIS PATIENT: 55 minutes.    Ihor Austin M.D on 06/30/2015 at 12:03 AM  Between 7am to 6pm - Pager - [phone]  After 6pm go to [email or web address] - password [email or web address] [facility]  [place] [facility]  Office  [phone]  CC: Primary care physician; Vonita Moss, MD

## 2015-06-30 NOTE — ED Notes (Signed)
Report given to floor RN. Pt taken to floor via stretcher. Vital signs stable prior to transport.  

## 2015-06-30 NOTE — Progress Notes (Signed)
Pharmacy Antibiotic Note  Benjamin Chan is a 70 y.o. male admitted on 06/29/2015 with sepsis.  Pharmacy has been consulted for Zosyn and vancomycin dosing. This writer discontinued the ED providers consult for cefepime.  Plan: 1. Zosyn 3.375 gm IV Q8H EI 2. Vancomycin 1 gm IV Q24H with stacked dosing, second dose approximately 6 hours after first. Predicted trough 16 mcg/mL. Pharmacy will continue to follow and adjust as needed to maintain trough 15 to 20 mcg/mL.   Vd 58.1 L, Ke 0.03 hr-1, T1/2 22.9 hr  Height: 5\' 11"  (180.3 cm) Weight: 208 lb 12.4 oz (94.7 kg) IBW/kg (Calculated) : 75.3  Temp (24hrs), Avg:100.2 F (37.9 C), Min:100.2 F (37.9 C), Max:100.2 F (37.9 C)   Recent Labs Lab 06/29/15 2139 06/29/15 2255  WBC 23.5*  --   CREATININE 2.55*  --   LATICACIDVEN  --  1.5    Estimated Creatinine Clearance: 31.2 mL/min (by C-G formula based on Cr of 2.55).    No Known Allergies   Thank you for allowing pharmacy to be a part of this patient's care.  Carola Frost, Pharm.D., BCPS Clinical Pharmacist 06/30/2015 12:43 AM

## 2015-06-30 NOTE — Care Management Note (Signed)
Case Management Note  Patient Details  Name: [name] MRN: [id number] Date of Birth: 1946-03-01  Subjective/Objective:         Consult to social work per "unable to afford meds." [name] has [employer] and is responsible for his medication co-pay. However, at time of discharge, case management will look for medication coupons on the internet. Very little assistance is available for Inhalers however. [name]'s PCP may be able to assist him with any manufacturer medication patient assistance programs.       Action/Plan:   Expected Discharge Date:                  Expected Discharge Plan:     In-House Referral:     Discharge planning Services     Post Acute Care Choice:    Choice offered to:     DME Arranged:    DME Agency:     HH Arranged:    HH Agency:     Status of Service:     Medicare Important Message Given:    Date Medicare IM Given:    Medicare IM give by:    Date Additional Medicare IM Given:    Additional Medicare Important Message give by:     If discussed at Long Length of Stay Meetings, dates discussed:    Additional Comments:  Desere Gwin A, RN 06/30/2015, 8:42 AM

## 2015-06-30 NOTE — Progress Notes (Signed)
Clinical Social Worker (CSW) received consult for medication assistance. RN Case Manager is aware of above. Please reconsult if future social work needs arise. CSW signing off.   Adeel Guiffre Morgan, LCSW [phone] 

## 2015-06-30 NOTE — Progress Notes (Signed)
Pharmacy Antibiotic Follow-up Note  Benjamin Chan is a 70 y.o. year-old male admitted on 06/29/2015.  The patient is currently on day 1 of Vancomycin and Zosyn for sepsis.  Assessment/Plan: BCID results were discussed with Dr. Imogene Burn and the patient's antibiotics will be changed to Ceftriaxone 2 gm IV Q24H. Further narrowing will be determined based on finalized susceptibilities.  Temp (24hrs), Avg:99.4 F (37.4 C), Min:97.6 F (36.4 C), Max:102.4 F (39.1 C)   Recent Labs Lab 06/29/15 2139 06/30/15 0103  WBC 23.5* 20.9*    Recent Labs Lab 06/29/15 2139 06/30/15 0103  CREATININE 2.55* 2.27*   Estimated Creatinine Clearance: 33.4 mL/min (by C-G formula based on Cr of 2.27).    No Known Allergies  Antimicrobials this admission: Vancomycin 1 gm IV Q24H, Zosyn 3.375 gm IV Q8H   >>  Ceftriaxone 2 gm IV Q24H    >>   Levels/dose changes this admission:   Microbiology results:  BCx:  4/15  - Kleb pneumo growing in aerobic and anaerobic bottle , No KPC detected   UCx:    Sputum:    MRSA PCR:   Thank you for allowing pharmacy to be a part of this patient's care.  Janeene Sand D PharmD 06/30/2015 5:39 PM

## 2015-06-30 NOTE — Progress Notes (Signed)
[facility] - [place] at [facility]   PATIENT NAME: Benjamin Chan    MR#:  [id number]  DATE OF BIRTH:  29-Jan-1946  SUBJECTIVE:  CHIEF COMPLAINT:   Chief Complaint  Patient presents with  . Weakness   weakness.  REVIEW OF SYSTEMS:  CONSTITUTIONAL: No fever, has generalized weakness.  EYES: No blurred or double vision.  EARS, NOSE, AND THROAT: No tinnitus or ear pain.  RESPIRATORY: No cough, shortness of breath, wheezing or hemoptysis.  CARDIOVASCULAR: No chest pain, orthopnea, edema.  GASTROINTESTINAL: No nausea, vomiting, diarrhea or abdominal pain.  GENITOURINARY: No dysuria, hematuria.  ENDOCRINE: No polyuria, nocturia,  HEMATOLOGY: No anemia, easy bruising or bleeding SKIN: No rash or lesion. MUSCULOSKELETAL: No joint pain or arthritis.   NEUROLOGIC: No tingling, numbness, weakness.  PSYCHIATRY: No anxiety or depression.   DRUG ALLERGIES:  No Known Allergies  VITALS:  Blood pressure 121/57, pulse 77, temperature 97.6 F (36.4 C), temperature source Oral, resp. rate 24, height  (1.727 m), weight 94.983 kg (209 lb 6.4 oz), SpO2 97 %.  PHYSICAL EXAMINATION:  GENERAL:  70 y.o.-year-old patient lying in the bed with no acute distress.  EYES: Pupils equal, round, reactive to light and accommodation. No scleral icterus. Extraocular muscles intact.  HEENT: Head atraumatic, normocephalic. Oropharynx and nasopharynx clear.  NECK:  Supple, no jugular venous distention. No thyroid enlargement, no tenderness.  LUNGS: Normal breath sounds bilaterally, no wheezing, rales,rhonchi or crepitation. No use of accessory muscles of respiration.  CARDIOVASCULAR: S1, S2 normal. No murmurs, rubs, or gallops.  ABDOMEN: Soft, nontender, nondistended. Bowel sounds present. No organomegaly or mass.  EXTREMITIES: No pedal edema, cyanosis, or clubbing.  NEUROLOGIC: Cranial nerves II through XII are intact. Muscle strength 4/5 in all extremities. Sensation intact.  Gait not checked.  PSYCHIATRIC: The patient is alert and oriented x 2.  SKIN: No obvious rash, lesion, or ulcer.    LABORATORY PANEL:   CBC  Recent Labs Lab 06/30/15 0103  WBC 20.9*  HGB 11.6*  HCT 34.7*  PLT 255   ------------------------------------------------------------------------------------------------------------------  Chemistries   Recent Labs Lab 06/30/15 0103  NA 127*  K 4.3  CL 99*  CO2 21*  GLUCOSE 473*  BUN 48*  CREATININE 2.27*  CALCIUM 7.5*  AST 17  ALT 13*  ALKPHOS 71  BILITOT 0.8   ------------------------------------------------------------------------------------------------------------------  Cardiac Enzymes  Recent Labs Lab 06/29/15 2139  TROPONINI <0.03   ------------------------------------------------------------------------------------------------------------------  RADIOLOGY:  Dg Chest 2 View  06/29/2015  CLINICAL DATA:  Generalized weakness. EXAM: CHEST  2 VIEW COMPARISON:  None. 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 Borderline heart size. Mild aortic tortuosity. The lungs are clear. Pulmonary vasculature is normal. There are no pleural effusions. Hilar and mediastinal contours are unremarkable. IMPRESSION: No active cardiopulmonary disease. Electronically Signed   By: Ellery Plunk M.D.   On: 06/29/2015 22:22    EKG:   Orders placed or performed during the hospital encounter of 06/29/15  . ED EKG  . ED EKG  . EKG 12-Lead  . EKG 12-Lead    ASSESSMENT AND PLAN:   70 year old male patient with history of coronary artery disease, hypertension, type 2 diabetes mellitus, COPD presented to the emergency room with generalized weakness.   1. Acute kidney injury on CKD stage III. The patient's previous creatinine was 1.58. Hold chlorthalidone and Vasotec. Continue normal saline IV, follow-up renal ultrasound and BMP.  2. [facility], leuk[facility]. Continue IV vancomycin and IV Zosyn,  follow-up  CBC, blood culture and urine culture.  3. Diabetes to his hyperglycemia. Blood sugar was 529 this morning. Start Lantus and sliding scale. Home Lantus dose was 50 units at bedtime.  4.pseudo-hyponatremia due to hyperglycemia. Follow-up BMP.  5. Essential hypertension. Controlled. Hold Lopressor,  chlorthalidone and Vasotec due to low side blood pressure.  6. CAD. Continue aspirin.  All the records are reviewed and case discussed with Care Management/Social Workerr. Management plans discussed with the patient, family and they are in agreement.  CODE STATUS: Full code  TOTAL TIME TAKING CARE OF THIS PATIENT: 42 minutes.  Greater than 50% time was spent on coordination of care and face-to-face counseling.  POSSIBLE D/C IN >3 DAYS, DEPENDING ON CLINICAL CONDITION.   Shaune Pollack M.D on 06/30/2015 at 2:08 PM  Between 7am to 6pm - Pager - [phone]  After 6pm go to [email or web address] - password [email or web address] [facility]  [place]  [facility]  Office  [phone]  CC: Primary care physician; Vonita Moss, MD

## 2015-07-01 LAB — CBC
HCT: 33.6 % — ABNORMAL LOW (ref 40.0–52.0)
Hemoglobin: 11.2 g/dL — ABNORMAL LOW (ref 13.0–18.0)
MCH: 29 pg (ref 26.0–34.0)
MCHC: 33.4 g/dL (ref 32.0–36.0)
MCV: 86.9 fL (ref 80.0–100.0)
PLATELETS: 250 10*3/uL (ref 150–440)
RBC: 3.87 MIL/uL — AB (ref 4.40–5.90)
RDW: 13 % (ref 11.5–14.5)
WBC: 15.5 10*3/uL — AB (ref 3.8–10.6)

## 2015-07-01 LAB — GLUCOSE, CAPILLARY
GLUCOSE-CAPILLARY: 235 mg/dL — AB (ref 65–99)
Glucose-Capillary: 207 mg/dL — ABNORMAL HIGH (ref 65–99)
Glucose-Capillary: 262 mg/dL — ABNORMAL HIGH (ref 65–99)
Glucose-Capillary: 224 mg/dL — ABNORMAL HIGH (ref 65–99)

## 2015-07-01 LAB — BASIC METABOLIC PANEL
Anion gap: 5 (ref 5–15)
BUN: 34 mg/dL — AB (ref 6–20)
CALCIUM: 7.7 mg/dL — AB (ref 8.9–10.3)
CO2: 23 mmol/L (ref 22–32)
CREATININE: 1.76 mg/dL — AB (ref 0.61–1.24)
Chloride: 104 mmol/L (ref 101–111)
GFR calc non Af Amer: 37 mL/min — ABNORMAL LOW (ref >60)
GFR, EST AFRICAN AMERICAN: 43 mL/min — AB (ref >60)
Glucose, Bld: 213 mg/dL — ABNORMAL HIGH (ref 65–99)
Potassium: 3.8 mmol/L (ref 3.5–5.1)
SODIUM: 132 mmol/L — AB (ref 135–145)

## 2015-07-01 LAB — MAGNESIUM: MAGNESIUM: 1.8 mg/dL (ref 1.7–2.4)

## 2015-07-01 MED ORDER — ALBUTEROL SULFATE (2.5 MG/3ML) 0.083% IN NEBU
3.0000 mL | INHALATION_SOLUTION | Freq: Every day | RESPIRATORY_TRACT | Status: DC
  Administered 2015-07-02: 3 mL via RESPIRATORY_TRACT
  Filled 2015-07-01: qty 3

## 2015-07-01 MED ORDER — ASPIRIN 325 MG PO TABS: 325.0000 mg | ORAL_TABLET | Freq: Once | ORAL | Status: DC

## 2015-07-01 MED ORDER — INSULIN GLARGINE 100 UNIT/ML ~~LOC~~ SOLN
35.0000 [IU] | Freq: Every day | SUBCUTANEOUS | Status: DC
  Filled 2015-07-01: qty 0.35

## 2015-07-01 NOTE — Progress Notes (Signed)
[facility] - [place] at [facility]   PATIENT NAME: Benjamin Chan    MR#:  [id number]  DATE OF BIRTH:  02-27-1946  SUBJECTIVE:  CHIEF COMPLAINT:   Chief Complaint  Patient presents with  . Weakness   weakness. He feels better.  REVIEW OF SYSTEMS:  CONSTITUTIONAL: No fever, has generalized weakness.  EYES: No blurred or double vision.  EARS, NOSE, AND THROAT: No tinnitus or ear pain.  RESPIRATORY: No cough, shortness of breath, wheezing or hemoptysis.  CARDIOVASCULAR: No chest pain, orthopnea, edema.  GASTROINTESTINAL: No nausea, vomiting, diarrhea or abdominal pain.  GENITOURINARY: No dysuria, hematuria.  ENDOCRINE: No polyuria, nocturia,  HEMATOLOGY: No anemia, easy bruising or bleeding SKIN: No rash or lesion. MUSCULOSKELETAL: No joint pain or arthritis.   NEUROLOGIC: No tingling, numbness, weakness.  PSYCHIATRY: No anxiety or depression.   DRUG ALLERGIES:  No Known Allergies  VITALS:  Blood pressure 116/55, pulse 70, temperature 99.3 F (37.4 C), temperature source Oral, resp. rate 17, height 5\' 8"  (1.727 m), weight 94.983 kg (209 lb 6.4 oz), SpO2 96 %.  PHYSICAL EXAMINATION:  GENERAL:  70 y.o.-year-old patient lying in the bed with no acute distress.  EYES: Pupils equal, round, reactive to light and accommodation. No scleral icterus. Extraocular muscles intact.  HEENT: Head atraumatic, normocephalic. Oropharynx and nasopharynx clear.  NECK:  Supple, no jugular venous distention. No thyroid enlargement, no tenderness.  LUNGS: Normal breath sounds bilaterally, no wheezing, rales,rhonchi or crepitation. No use of accessory muscles of respiration.  CARDIOVASCULAR: S1, S2 normal. No murmurs, rubs, or gallops.  ABDOMEN: Soft, nontender, nondistended. Bowel sounds present. No organomegaly or mass.  EXTREMITIES: No pedal edema, cyanosis, or clubbing.  NEUROLOGIC: Cranial nerves II through XII are intact. Muscle strength 4/5 in all extremities.  Sensation intact. Gait not checked.  PSYCHIATRIC: The patient is alert and oriented x 2.  SKIN: No obvious rash, lesion, or ulcer.    LABORATORY PANEL:   CBC  Recent Labs Lab 07/01/15 0539  WBC 15.5*  HGB 11.2*  HCT 33.6*  PLT 250   ------------------------------------------------------------------------------------------------------------------  Chemistries   Recent Labs Lab 06/30/15 0103 07/01/15 0539  NA 127* 132*  K 4.3 3.8  CL 99* 104  CO2 21* 23  GLUCOSE 473* 213*  BUN 48* 34*  CREATININE 2.27* 1.76*  CALCIUM 7.5* 7.7*  MG  --  1.8  AST 17  --   ALT 13*  --   ALKPHOS 71  --   BILITOT 0.8  --    ------------------------------------------------------------------------------------------------------------------  Cardiac Enzymes  Recent Labs Lab 06/29/15 2139  TROPONINI <0.03   ------------------------------------------------------------------------------------------------------------------  RADIOLOGY:  Dg Chest 2 View  06/29/2015  CLINICAL DATA:  Generalized weakness. EXAM: CHEST  2 VIEW COMPARISON:  None. 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 Borderline heart size. Mild aortic tortuosity. The lungs are clear. Pulmonary vasculature is normal. There are no pleural effusions. Hilar and mediastinal contours are unremarkable. IMPRESSION: No active cardiopulmonary disease. Electronically Signed   By: Ellery Plunk M.D.   On: 06/29/2015 22:22   US Renal  06/30/2015  CLINICAL DATA:  70 year old male with acute renal failure EXAM: RENAL / URINARY TRACT ULTRASOUND COMPLETE COMPARISON:  None. FINDINGS: Right Kidney: Length: 12.5 cm. Echogenicity within normal limits. No mass or hydronephrosis visualized. Left Kidney: Length: 12.7 cm. Echogenicity within normal limits. No mass or hydronephrosis visualized. Bladder: Appears normal for degree of bladder distention. IMPRESSION: Normal sonographic appearance of the kidneys. No evidence of hydronephrosis.  Electronically Signed  By: Malachy Moan M.D.   On: 06/30/2015 15:25    EKG:   Orders placed or performed during the hospital encounter of 06/29/15  . ED EKG  . ED EKG  . EKG 12-Lead  . EKG 12-Lead    ASSESSMENT AND PLAN:   70 year old male patient with history of coronary artery disease, hypertension, type 2 diabetes mellitus, COPD presented to the emergency room with generalized weakness.   1. Acute kidney injury on CKD stage III. Improving. The patient's previous creatinine was 1.58. Hold chlorthalidone and Vasotec. Continue normal saline IV,  renal ultrasound: normal, follow-up BMP.  2. Sepsis with Urinary tract infection and bateremia.  blood culture: KLEBSIELLA PNEUMONIAE and urine culture: GNR. discontinued IV vancomycin and IV Zosyn, changed to Ceftriaxone 2 gm IV Q24H, follow-up CBC.  3. Diabetes to his hyperglycemia. On lantus 20 units bid, Change Lantus to 35 units HS and continue sliding scale. Home Lantus dose was 50 units at bedtime.   4.pseudo-hyponatremia due to hyperglycemia. Improving. Follow-up BMP.  5. Essential hypertension. Controlled. Hold Lopressor,  chlorthalidone and Vasotec due to low side blood pressure.  6. CAD. Continue aspirin.  All the records are reviewed and case discussed with Care Management/Social Workerr. Management plans discussed with the patient, his wife and they are in agreement.  CODE STATUS: Full code  TOTAL TIME TAKING CARE OF THIS PATIENT: 37 minutes.  Greater than 50% time was spent on coordination of care and face-to-face counseling.  POSSIBLE D/C IN 3 DAYS, DEPENDING ON CLINICAL CONDITION.   Shaune Pollack M.D on 07/01/2015 at 12:30 PM  Between 7am to 6pm - Pager - [phone]  After 6pm go to [email or web address] - password [email or web address] [facility]  [place] [facility]  Office  [phone]  CC: Primary care physician; Vonita Moss, MD

## 2015-07-01 NOTE — Evaluation (Signed)
Physical Therapy Evaluation Patient Details Name: Benjamin Chan MRN: [id number] DOB: [birth year] Today's Date: 07/01/2015   History of Present Illness  Ogden Handlin is a 70yo black male who comes to [facility] 4/14 after acute onset of weakness. Pt reports he spent a sizeable portion of the daylight hours perfomring yardwork, and admittedly performed what was likely insufficicent fluid intake. Since admission, pt has had BG in 400-500's range. PMH: HTN, DM, COPD, CAD. Pt reports he maintains 13 yards in the area (mowing).    Clinical Impression  Pt performs all mobility with modified indep or greater (supervision c AMB due to IV pole). Pt reports his ambulation and balance are at baseline and he feels good to walk. Pt AMB 700+ feet with supervision, mild tachypnea noted. Gait speed is ~.56m/s. No history of falls, pt very physically active at baseline. No PT services needed at this time. PT signing off.     Follow Up Recommendations No PT follow up    Equipment Recommendations       Recommendations for Other Services       Precautions / Restrictions Precautions Precautions: None      Mobility  Bed Mobility Overal bed mobility: Independent                Transfers Overall transfer level: Independent                  Ambulation/Gait Ambulation/Gait assistance: Supervision       Gait velocity: 0.17m/s   General Gait Details: 700 feet at supervision, reports to be a baseline for balance and AMB.   Stairs            Wheelchair Mobility    Modified Rankin (Stroke Patients Only)       Balance Overall balance assessment: Modified Independent (denies falls history. )                                           Pertinent Vitals/Pain Pain Assessment: No/denies pain    Home Living Family/patient expects to be discharged to:: Private residence Living Arrangements: Spouse/significant other Available Help at Discharge: Family Type  of Home: House Home Access: Stairs to enter Entrance Stairs-Rails: Right Entrance Stairs-Number of Steps: 3 Home Layout: One level Home Equipment: None      Prior Function                 Hand Dominance        Extremity/Trunk Assessment   Upper Extremity Assessment: Overall WFL for tasks assessed           Lower Extremity Assessment: Overall WFL for tasks assessed         Communication      Cognition Arousal/Alertness: Awake/alert Behavior During Therapy: WFL for tasks assessed/performed Overall Cognitive Status: Within Functional Limits for tasks assessed                      General Comments      Exercises        Assessment/Plan    PT Assessment Patent does not need any further PT services  PT Diagnosis     PT Problem List    PT Treatment Interventions     PT Goals (Current goals can be found in the Care Plan section) Acute Rehab PT Goals PT Goal Formulation: All assessment and education complete,  DC therapy    Frequency     Barriers to discharge        Co-evaluation               End of Session Equipment Utilized During Treatment: Gait belt Activity Tolerance: Patient tolerated treatment well Patient left: in bed;with call bell/phone within reach;with family/visitor present Nurse Communication: Mobility status;Other (comment)         Time: 2706-2376 PT Time Calculation (min) (ACUTE ONLY): 13 min   Charges:   PT Evaluation $PT Eval Low Complexity: 1 Procedure PT Treatments $Therapeutic Activity: 8-22 mins   PT G Codes:       3:37 PM, 07/17/15 Rosamaria Lints, PT, DPT PRN Physical Therapist - [facility] License # [id number] [phone] (mobile)

## 2015-07-02 ENCOUNTER — Encounter: Payer: Self-pay | Admitting: Internal Medicine

## 2015-07-02 DIAGNOSIS — N179 Acute kidney failure, unspecified: Secondary | ICD-10-CM | POA: Diagnosis present

## 2015-07-02 DIAGNOSIS — R7881 Bacteremia: Secondary | ICD-10-CM | POA: Diagnosis present

## 2015-07-02 DIAGNOSIS — N189 Chronic kidney disease, unspecified: Secondary | ICD-10-CM

## 2015-07-02 LAB — BASIC METABOLIC PANEL
Anion gap: 5 (ref 5–15)
BUN: 26 mg/dL — AB (ref 6–20)
CHLORIDE: 106 mmol/L (ref 101–111)
CO2: 23 mmol/L (ref 22–32)
CREATININE: 1.32 mg/dL — AB (ref 0.61–1.24)
Calcium: 8.2 mg/dL — ABNORMAL LOW (ref 8.9–10.3)
GFR calc non Af Amer: 53 mL/min — ABNORMAL LOW (ref >60)
GLUCOSE: 195 mg/dL — AB (ref 65–99)
Potassium: 4 mmol/L (ref 3.5–5.1)
Sodium: 134 mmol/L — ABNORMAL LOW (ref 135–145)

## 2015-07-02 LAB — GLUCOSE, CAPILLARY
GLUCOSE-CAPILLARY: 199 mg/dL — AB (ref 65–99)
GLUCOSE-CAPILLARY: 268 mg/dL — AB (ref 65–99)

## 2015-07-02 LAB — VANCOMYCIN, TROUGH: VANCOMYCIN TR: 4 ug/mL — AB (ref 10–20)

## 2015-07-02 LAB — CBC
HCT: 34.9 % — ABNORMAL LOW (ref 40.0–52.0)
HEMOGLOBIN: 11.9 g/dL — AB (ref 13.0–18.0)
MCH: 29.9 pg (ref 26.0–34.0)
MCHC: 34 g/dL (ref 32.0–36.0)
MCV: 88.1 fL (ref 80.0–100.0)
Platelets: 278 10*3/uL (ref 150–440)
RBC: 3.96 MIL/uL — ABNORMAL LOW (ref 4.40–5.90)
RDW: 13.4 % (ref 11.5–14.5)
WBC: 11.4 10*3/uL — ABNORMAL HIGH (ref 3.8–10.6)

## 2015-07-02 LAB — HEMOGLOBIN A1C: Hgb A1c MFr Bld: 11.5 % — ABNORMAL HIGH (ref 4.0–6.0)

## 2015-07-02 MED ORDER — CEPHALEXIN 500 MG PO CAPS: 500.0000 mg | ORAL_CAPSULE | Freq: Three times a day (TID) | ORAL | Status: DC

## 2015-07-02 NOTE — Care Management Important Message (Signed)
Important Message  Patient Details  Name: Benjamin Chan MRN: [id number] Date of Birth: 08-18-1945   Medicare Important Message Given:  Yes    Olegario Messier A Tanisa Lagace 07/02/2015, 11:09 AM

## 2015-07-02 NOTE — Consult Note (Signed)
[facility] Infectious Disease     Reason for Consult:Bacteremia and UTI    Referring Physician: Estanislado Spire Date of Admission:  06/29/2015   Principal Problem:   Sepsis [facility]) Active Problems:   UTI (lower urinary tract infection)   HPI: Benjamin Chan is a 70 y.o. male admitted  4/14 with weakness, low grade fever. Had wbc 23, cr 2.55, glucose 410.  UA with TNTC WBC and WBC clumps. BCX and UCx + klebsiella. Wbc now down to 11 and cr 1.32.   Renal USS negative.  He has DM (A1c 11.5)  Past Medical History  Diagnosis Date  . Diabetes mellitus without complication ([place])   . Hypertension   . COPD (chronic obstructive pulmonary disease) ([place])   . CAD (coronary artery disease)    Past Surgical History  Procedure Laterality Date  . Coronary artery bypass graft    . Inguinal hernia repair      right   Social History  Substance Use Topics  . Smoking status: Former Research scientist (life sciences)  . Smokeless tobacco: None  . Alcohol Use: No   Family History  Problem Relation Age of Onset  . Hypertension Father     Allergies: No Known Allergies  Current antibiotics: Antibiotics Given (last 72 hours)    Date/Time Action Medication Dose Rate   06/30/15 0106 Given   piperacillin-tazobactam (ZOSYN) IVPB 3.375 g 3.375 g 100 mL/hr   06/30/15 0106 Given   vancomycin (VANCOCIN) IVPB 1000 mg/200 mL premix 1,000 mg 200 mL/hr   06/30/15 0954 Given   piperacillin-tazobactam (ZOSYN) IVPB 3.375 g 3.375 g 12.5 mL/hr   06/30/15 1022 Given   vancomycin (VANCOCIN) IVPB 1000 mg/200 mL premix 1,000 mg 200 mL/hr   06/30/15 1644 Given   cefTRIAXone (ROCEPHIN) 2 g in dextrose 5 % 50 mL IVPB 2 g 100 mL/hr   07/01/15 1610 Given   cefTRIAXone (ROCEPHIN) 2 g in dextrose 5 % 50 mL IVPB 2 g 100 mL/hr      MEDICATIONS: . albuterol  3 mL Inhalation Daily  . aspirin EC  81 mg Oral Daily  . cefTRIAXone (ROCEPHIN)  IV  2 g Intravenous Q24H  . finasteride  5 mg Oral Daily  . heparin  5,000 Units Subcutaneous 3 times  per day  . insulin aspart  0-15 Units Subcutaneous TID WC  . insulin aspart  0-5 Units Subcutaneous QHS  . insulin glargine  35 Units Subcutaneous QHS  . simvastatin  40 mg Oral Daily  . sodium chloride flush  3 mL Intravenous Q12H  . tiotropium  18 mcg Inhalation Daily    Review of Systems - 11 systems reviewed and negative per HPI   OBJECTIVE: Temp:  [97.9 F (36.6 C)-98.7 F (37.1 C)] 98.4 F (36.9 C) (04/17 1357) Pulse Rate:  [65-76] 72 (04/17 1357) Resp:  [18-20] 18 (04/17 1357) BP: (126-150)/(64-70) 150/68 mmHg (04/17 1357) SpO2:  [97 %-98 %] 97 % (04/17 1357) Physical Exam  Constitutional: He is oriented to person, place, and time. He appears well-developed and well-nourished. No distress.  HENT:  Mouth/Throat: Oropharynx is clear and moist. No oropharyngeal exudate.  Cardiovascular: Normal rate, regular rhythm and normal heart sounds. Exam reveals no gallop and no friction rub.  No murmur heard.  Pulmonary/Chest: Effort normal and breath sounds normal. No respiratory distress. He has no wheezes.  Abdominal: Soft. Bowel sounds are normal. He exhibits no distension. There is no tenderness.  Lymphadenopathy:  He has no cervical adenopathy.  Neurological: He is alert  and oriented to person, place, and time.  Skin: Skin is warm and dry. No rash noted. No erythema.  Psychiatric: He has a normal mood and affect. His behavior is normal.     LABS: Results for orders placed or performed during the hospital encounter of 06/29/15 (from the past 48 hour(s))  Glucose, capillary     Status: Abnormal   Collection Time: 06/30/15  4:20 PM  Result Value Ref Range   Glucose-Capillary 267 (H) 65 - 99 mg/dL  Glucose, capillary     Status: Abnormal   Collection Time: 06/30/15  9:52 PM  Result Value Ref Range   Glucose-Capillary 222 (H) 65 - 99 mg/dL   Comment 1 Notify RN   Basic metabolic panel     Status: Abnormal   Collection Time: 07/01/15  5:39 AM  Result Value Ref Range    Sodium 132 (L) 135 - 145 mmol/L   Potassium 3.8 3.5 - 5.1 mmol/L   Chloride 104 101 - 111 mmol/L   CO2 23 22 - 32 mmol/L   Glucose, Bld 213 (H) 65 - 99 mg/dL   BUN 34 (H) 6 - 20 mg/dL   Creatinine, Ser 1.76 (H) 0.61 - 1.24 mg/dL   Calcium 7.7 (L) 8.9 - 10.3 mg/dL   GFR calc non Af Amer 37 (L) >60 mL/min   GFR calc Af Amer 43 (L) >60 mL/min    Comment: (NOTE) The eGFR has been calculated using the CKD EPI equation. This calculation has not been validated in all clinical situations. eGFR's persistently <60 mL/min signify possible Chronic Kidney Disease.    Anion gap 5 5 - 15  CBC     Status: Abnormal   Collection Time: 07/01/15  5:39 AM  Result Value Ref Range   WBC 15.5 (H) 3.8 - 10.6 K/uL   RBC 3.87 (L) 4.40 - 5.90 MIL/uL   Hemoglobin 11.2 (L) 13.0 - 18.0 g/dL   HCT 33.6 (L) 40.0 - 52.0 %   MCV 86.9 80.0 - 100.0 fL   MCH 29.0 26.0 - 34.0 pg   MCHC 33.4 32.0 - 36.0 g/dL   RDW 13.0 11.5 - 14.5 %   Platelets 250 150 - 440 K/uL  Magnesium     Status: None   Collection Time: 07/01/15  5:39 AM  Result Value Ref Range   Magnesium 1.8 1.7 - 2.4 mg/dL  Glucose, capillary     Status: Abnormal   Collection Time: 07/01/15  7:49 AM  Result Value Ref Range   Glucose-Capillary 207 (H) 65 - 99 mg/dL  Glucose, capillary     Status: Abnormal   Collection Time: 07/01/15 12:26 PM  Result Value Ref Range   Glucose-Capillary 262 (H) 65 - 99 mg/dL  Glucose, capillary     Status: Abnormal   Collection Time: 07/01/15  4:20 PM  Result Value Ref Range   Glucose-Capillary 235 (H) 65 - 99 mg/dL  Glucose, capillary     Status: Abnormal   Collection Time: 07/01/15  9:38 PM  Result Value Ref Range   Glucose-Capillary 224 (H) 65 - 99 mg/dL  Glucose, capillary     Status: Abnormal   Collection Time: 07/02/15  7:35 AM  Result Value Ref Range   Glucose-Capillary 199 (H) 65 - 99 mg/dL  Vancomycin, trough     Status: Abnormal   Collection Time: 07/02/15  7:38 AM  Result Value Ref Range   Vancomycin  Tr 4 (L) 10 - 20 ug/mL  CBC  Status: Abnormal   Collection Time: 07/02/15  7:39 AM  Result Value Ref Range   WBC 11.4 (H) 3.8 - 10.6 K/uL   RBC 3.96 (L) 4.40 - 5.90 MIL/uL   Hemoglobin 11.9 (L) 13.0 - 18.0 g/dL   HCT 34.9 (L) 40.0 - 52.0 %   MCV 88.1 80.0 - 100.0 fL   MCH 29.9 26.0 - 34.0 pg   MCHC 34.0 32.0 - 36.0 g/dL   RDW 13.4 11.5 - 14.5 %   Platelets 278 150 - 440 K/uL  Basic metabolic panel     Status: Abnormal   Collection Time: 07/02/15  7:39 AM  Result Value Ref Range   Sodium 134 (L) 135 - 145 mmol/L   Potassium 4.0 3.5 - 5.1 mmol/L   Chloride 106 101 - 111 mmol/L   CO2 23 22 - 32 mmol/L   Glucose, Bld 195 (H) 65 - 99 mg/dL   BUN 26 (H) 6 - 20 mg/dL   Creatinine, Ser 1.32 (H) 0.61 - 1.24 mg/dL   Calcium 8.2 (L) 8.9 - 10.3 mg/dL   GFR calc non Af Amer 53 (L) >60 mL/min   GFR calc Af Amer >60 >60 mL/min    Comment: (NOTE) The eGFR has been calculated using the CKD EPI equation. This calculation has not been validated in all clinical situations. eGFR's persistently <60 mL/min signify possible Chronic Kidney Disease.    Anion gap 5 5 - 15  Glucose, capillary     Status: Abnormal   Collection Time: 07/02/15 11:24 AM  Result Value Ref Range   Glucose-Capillary 268 (H) 65 - 99 mg/dL   No components found for: ESR, C REACTIVE PROTEIN MICRO: Recent Results (from the past 720 hour(s))  Urine culture     Status: Abnormal (Preliminary result)   Collection Time: 06/29/15 10:41 PM  Result Value Ref Range Status   Specimen Description URINE, RANDOM  Final   Special Requests NONE  Final   Culture (A)  Final    >=100,000 COLONIES/mL KLEBSIELLA PNEUMONIAE HOLDING FOR ADDITIONAL POSSIBLE PATHOGEN    Report Status PENDING  Incomplete   Organism ID, Bacteria KLEBSIELLA PNEUMONIAE (A)  Final      Susceptibility   Klebsiella pneumoniae - MIC*    AMPICILLIN 16 RESISTANT Resistant     CEFAZOLIN <=4 SENSITIVE Sensitive     CEFTRIAXONE <=1 SENSITIVE Sensitive      CIPROFLOXACIN <=0.25 SENSITIVE Sensitive     GENTAMICIN <=1 SENSITIVE Sensitive     IMIPENEM <=0.25 SENSITIVE Sensitive     NITROFURANTOIN <=16 SENSITIVE Sensitive     TRIMETH/SULFA <=20 SENSITIVE Sensitive     AMPICILLIN/SULBACTAM <=2 SENSITIVE Sensitive     PIP/TAZO <=4 SENSITIVE Sensitive     Extended ESBL NEGATIVE Sensitive     * >=100,000 COLONIES/mL KLEBSIELLA PNEUMONIAE  Blood Culture (routine x 2)     Status: None (Preliminary result)   Collection Time: 06/29/15 10:55 PM  Result Value Ref Range Status   Specimen Description BLOOD RIGHT ASSIST CONTROL  Final   Special Requests BOTTLES DRAWN AEROBIC AND ANAEROBIC 5CCAERO,3CCANA  Final   Culture NO GROWTH 3 DAYS  Final   Report Status PENDING  Incomplete  Blood Culture (routine x 2)     Status: Abnormal (Preliminary result)   Collection Time: 06/29/15 10:55 PM  Result Value Ref Range Status   Specimen Description BLOOD LEFT ASSIST CONTROL  Final   Special Requests   Final    BOTTLES DRAWN AEROBIC AND ANAEROBIC [place]  Culture  Setup Time   Final    GRAM NEGATIVE RODS IN BOTH AEROBIC AND ANAEROBIC BOTTLES CRITICAL RESULT CALLED TO, READ BACK BY AND VERIFIED WITH: Violeta Gelinas @ 1515 06/30/15 by [facility]    Culture (A)  Final    KLEBSIELLA PNEUMONIAE IN BOTH AEROBIC AND ANAEROBIC BOTTLES REPEATING SUSCEPTIBILTIES    Report Status PENDING  Incomplete  Blood Culture ID Panel (Reflexed)     Status: Abnormal   Collection Time: 06/29/15 10:55 PM  Result Value Ref Range Status   Enterococcus species NOT DETECTED NOT DETECTED Final   Vancomycin resistance NOT DETECTED NOT DETECTED Final   Listeria monocytogenes NOT DETECTED NOT DETECTED Final   Staphylococcus species NOT DETECTED NOT DETECTED Final   Staphylococcus aureus NOT DETECTED NOT DETECTED Final   Methicillin resistance NOT DETECTED NOT DETECTED Final   Streptococcus species NOT DETECTED NOT DETECTED Final   Streptococcus agalactiae NOT DETECTED NOT DETECTED  Final   Streptococcus pneumoniae NOT DETECTED NOT DETECTED Final   Streptococcus pyogenes NOT DETECTED NOT DETECTED Final   Acinetobacter baumannii NOT DETECTED NOT DETECTED Final   Enterobacteriaceae species DETECTED (A) NOT DETECTED Final    Comment: CRITICAL RESULT CALLED TO, READ BACK BY AND VERIFIED WITH: Violeta Gelinas @ [id number] 06/30/15 by [place]    Enterobacter cloacae complex NOT DETECTED NOT DETECTED Final   Escherichia coli NOT DETECTED NOT DETECTED Final   Klebsiella oxytoca NOT DETECTED NOT DETECTED Final   Klebsiella pneumoniae DETECTED (A) NOT DETECTED Final    Comment: CRITICAL RESULT CALLED TO, READ BACK BY AND VERIFIED WITH: Violeta Gelinas @ [phone] 06/30/15 by [place]    Proteus species NOT DETECTED NOT DETECTED Final   Serratia marcescens NOT DETECTED NOT DETECTED Final   Carbapenem resistance NOT DETECTED NOT DETECTED Final   Haemophilus influenzae NOT DETECTED NOT DETECTED Final   Neisseria meningitidis NOT DETECTED NOT DETECTED Final   Pseudomonas aeruginosa NOT DETECTED NOT DETECTED Final   Candida albicans NOT DETECTED NOT DETECTED Final   Candida glabrata NOT DETECTED NOT DETECTED Final   Candida krusei NOT DETECTED NOT DETECTED Final   Candida parapsilosis NOT DETECTED NOT DETECTED Final   Candida tropicalis NOT DETECTED NOT DETECTED Final    IMAGING: Dg Chest 2 View  06/29/2015  CLINICAL DATA:  Generalized weakness. EXAM: CHEST  2 VIEW COMPARISON:  None. 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 Borderline heart size. Mild aortic tortuosity. The lungs are clear. Pulmonary vasculature is normal. There are no pleural effusions. Hilar and mediastinal contours are unremarkable. IMPRESSION: No active cardiopulmonary disease. Electronically Signed   By: Andreas Newport M.D.   On: 06/29/2015 22:22   US Renal  06/30/2015  CLINICAL DATA:  70 year old male with acute renal failure EXAM: RENAL / URINARY TRACT ULTRASOUND COMPLETE COMPARISON:  None. FINDINGS: Right Kidney:  Length: 12.5 cm. Echogenicity within normal limits. No mass or hydronephrosis visualized. Left Kidney: Length: 12.7 cm. Echogenicity within normal limits. No mass or hydronephrosis visualized. Bladder: Appears normal for degree of bladder distention. IMPRESSION: Normal sonographic appearance of the kidneys. No evidence of hydronephrosis. Electronically Signed   By: Jacqulynn Cadet M.D.   On: 06/30/2015 15:25    Assessment:   Benjamin Chan is a 70 y.o. male with poorly controlled DM admitted with UTI and bacteremia with Klebsiella. Has defervesced, wbc down, feels well. Renal USS neg. Has hx bph and has seen urology at [facility] in past but not in some time.   Recommendations Stable for DC today  Would give another 10 days of abx with keflex 500 mg tid He should fu with urology at [facility] - I have discussed this with patient Also needs to see his PCP given very poor Dm contorl   Thank you very much for allowing me to participate in the care of this patient. Please call with questions.   Cheral Marker. Ola Spurr, MD

## 2015-07-02 NOTE — Discharge Instructions (Signed)
Heart healthy and ADA diet. °Activity as tolerated. °

## 2015-07-02 NOTE — Discharge Summary (Signed)
[facility] - [place] at [facility]   PATIENT NAME: Benjamin Chan    MR#:  [id number]  DATE OF BIRTH:  1945-06-29  DATE OF ADMISSION:  06/29/2015 ADMITTING PHYSICIAN: Ihor Austin, MD  DATE OF DISCHARGE: 07/02/2015 PRIMARY CARE PHYSICIAN: Vonita Moss, MD    ADMISSION DIAGNOSIS:  Hyponatremia [E87.1] UTI (lower urinary tract infection) [N39.0] Sepsis, due to unspecified organism [facility]) [A41.9] Acute renal failure, unspecified acute renal failure type (HCC) [N17.9]   DISCHARGE DIAGNOSIS:  Acute kidney injury on CKD stage III. Sepsis with KLEBSIELLA PNEUMONIAE Urinary tract infection and KLEBSIELLA PNEUMONIAE bateremia. SECONDARY DIAGNOSIS:   Past Medical History  Diagnosis Date  . Diabetes mellitus without complication (HCC)   . Hypertension   . COPD (chronic obstructive pulmonary disease) (HCC)   . CAD (coronary artery disease)     HOSPITAL COURSE:   70 year old male patient with history of coronary artery disease, hypertension, type 2 diabetes mellitus, COPD presented to the emergency room with generalized weakness.   1. Acute kidney injury on CKD stage III. Improving. The patient's previous creatinine was 1.58. Hold chlorthalidone and Vasotec. He has been treated with normal saline IV, renal ultrasound: normal. Renal function improved. Cr. 1.32.  2. Sepsis with Urinary tract infection and bateremia. blood culture: KLEBSIELLA PNEUMONIAE and urine culture: KLEBSIELLA PNEUMONIAE. He was treated with IV vancomycin and IV Zosyn, changed to Ceftriaxone 2 gm IV Q24H. Leukocytosis improved.  Keflex 500 mg tid for 10 days per Dr. Sampson Goon.  3. Diabetes to his hyperglycemia. BS was 529. Better controlled. He was on lantus 20 units bid, Changed Lantus to 35 units HS and on sliding scale.  Continue Home Lantus dose was 50 units at bedtime. Hb A1C is 11.5. Follow up PCP to adjust dose.  4.pseudo-hyponatremia due to hyperglycemia. Improved.  5.  Essential hypertension. Controlled. Lopressor, chlorthalidone and Vasotec were hold due to low side blood pressure. Since BP is elevated, resume home HTN medication.  6. CAD. Continue aspirin.  BPH. Follow up [facility] urologist.  Discussed with Dr. Sampson Goon. DISCHARGE CONDITIONS:   Stable, discharge to home today.  CONSULTS OBTAINED:  Treatment Team:  Mick Sell, MD  DRUG ALLERGIES:  No Known Allergies  DISCHARGE MEDICATIONS:   Current Discharge Medication List    START taking these medications   Details  cephALEXin (KEFLEX) 500 MG capsule Take 1 capsule (500 mg total) by mouth 3 (three) times daily. Qty: 30 capsule, Refills: 0      CONTINUE these medications which have NOT CHANGED   Details  acetaminophen (TYLENOL) 325 MG tablet Take 650 mg by mouth every 6 (six) hours as needed.    aspirin 81 MG tablet Take 81 mg by mouth daily.    chlorthalidone (HYGROTON) 25 MG tablet Take 25 mg by mouth daily.    doxazosin (CARDURA) 2 MG tablet Take 2 mg by mouth daily.    enalapril (VASOTEC) 20 MG tablet Take 1 tablet by mouth daily.    finasteride (PROSCAR) 5 MG tablet Take 1 tablet by mouth daily.    insulin glargine (LANTUS) 100 UNIT/ML injection Inject 50 Units into the skin at bedtime.    metoprolol tartrate (LOPRESSOR) 25 MG tablet Take 1 tablet by mouth daily.    nitroGLYCERIN (NITROSTAT) 0.4 MG SL tablet Place 0.4 mg under the tongue.    simvastatin (ZOCOR) 40 MG tablet Take 40 mg by mouth daily.    tiotropium (SPIRIVA) 18 MCG inhalation capsule Place 18 mcg into inhaler and inhale daily.  VENTOLIN HFA 108 (90 Base) MCG/ACT inhaler Inhale 2 puffs into the lungs daily.         DISCHARGE INSTRUCTIONS:    If you experience worsening of your admission symptoms, develop shortness of breath, life threatening emergency, suicidal or homicidal thoughts you must seek medical attention immediately by calling 911 or calling your MD immediately  if symptoms less  severe.  You Must read complete instructions/literature along with all the possible adverse reactions/side effects for all the Medicines you take and that have been prescribed to you. Take any new Medicines after you have completely understood and accept all the possible adverse reactions/side effects.   Please note  You were cared for by a hospitalist during your hospital stay. If you have any questions about your discharge medications or the care you received while you were in the hospital after you are discharged, you can call the unit and asked to speak with the hospitalist on call if the hospitalist that took care of you is not available. Once you are discharged, your primary care physician will handle any further medical issues. Please note that NO REFILLS for any discharge medications will be authorized once you are discharged, as it is imperative that you return to your primary care physician (or establish a relationship with a primary care physician if you do not have one) for your aftercare needs so that they can reassess your need for medications and monitor your lab values.    Today   SUBJECTIVE   No complaint.   VITAL SIGNS:  Blood pressure 150/68, pulse 72, temperature 98.4 F (36.9 C), temperature source Oral, resp. rate 18, height  (1.727 m), weight 94.983 kg (209 lb 6.4 oz), SpO2 97 %.  I/O:   Intake/Output Summary (Last 24 hours) at 07/02/15 1548 Last data filed at 07/02/15 1300  Gross per 24 hour  Intake   2810 ml  Output    625 ml  Net   2185 ml    PHYSICAL EXAMINATION:  GENERAL:  70 y.o.-year-old patient lying in the bed with no acute distress.  EYES: Pupils equal, round, reactive to light and accommodation. No scleral icterus. Extraocular muscles intact.  HEENT: Head atraumatic, normocephalic. Oropharynx and nasopharynx clear.  NECK:  Supple, no jugular venous distention. No thyroid enlargement, no tenderness.  LUNGS: Normal breath sounds bilaterally, no  wheezing, rales,rhonchi or crepitation. No use of accessory muscles of respiration.  CARDIOVASCULAR: S1, S2 normal. No murmurs, rubs, or gallops.  ABDOMEN: Soft, non-tender, non-distended. Bowel sounds present. No organomegaly or mass.  EXTREMITIES: No pedal edema, cyanosis, or clubbing.  NEUROLOGIC: Cranial nerves II through XII are intact. Muscle strength 5/5 in all extremities. Sensation intact. Gait not checked.  PSYCHIATRIC: The patient is alert and oriented x 3.  SKIN: No obvious rash, lesion, or ulcer.   DATA REVIEW:   CBC  Recent Labs Lab 07/02/15 0739  WBC 11.4*  HGB 11.9*  HCT 34.9*  PLT 278    Chemistries   Recent Labs Lab 06/30/15 0103 07/01/15 0539 07/02/15 0739  NA 127* 132* 134*  K 4.3 3.8 4.0  CL 99* 104 106  CO2 21* 23 23  GLUCOSE 473* 213* 195*  BUN 48* 34* 26*  CREATININE 2.27* 1.76* 1.32*  CALCIUM 7.5* 7.7* 8.2*  MG  --  1.8  --   AST 17  --   --   ALT 13*  --   --   ALKPHOS 71  --   --  BILITOT 0.8  --   --     Cardiac Enzymes  Recent Labs Lab 06/29/15 2139  TROPONINI <0.03    Microbiology Results  Results for orders placed or performed during the hospital encounter of 06/29/15  Urine culture     Status: Abnormal (Preliminary result)   Collection Time: 06/29/15 10:41 PM  Result Value Ref Range Status   Specimen Description URINE, RANDOM  Final   Special Requests NONE  Final   Culture (A)  Final    >=100,000 COLONIES/mL KLEBSIELLA PNEUMONIAE HOLDING FOR ADDITIONAL POSSIBLE PATHOGEN    Report Status PENDING  Incomplete   Organism ID, Bacteria KLEBSIELLA PNEUMONIAE (A)  Final      Susceptibility   Klebsiella pneumoniae - MIC*    AMPICILLIN 16 RESISTANT Resistant     CEFAZOLIN <=4 SENSITIVE Sensitive     CEFTRIAXONE <=1 SENSITIVE Sensitive     CIPROFLOXACIN <=0.25 SENSITIVE Sensitive     GENTAMICIN <=1 SENSITIVE Sensitive     IMIPENEM <=0.25 SENSITIVE Sensitive     NITROFURANTOIN <=16 SENSITIVE Sensitive     TRIMETH/SULFA <=20  SENSITIVE Sensitive     AMPICILLIN/SULBACTAM <=2 SENSITIVE Sensitive     PIP/TAZO <=4 SENSITIVE Sensitive     Extended ESBL NEGATIVE Sensitive     * >=100,000 COLONIES/mL KLEBSIELLA PNEUMONIAE  Blood Culture (routine x 2)     Status: None (Preliminary result)   Collection Time: 06/29/15 10:55 PM  Result Value Ref Range Status   Specimen Description BLOOD RIGHT ASSIST CONTROL  Final   Special Requests BOTTLES DRAWN AEROBIC AND ANAEROBIC 5CCAERO,3CCANA  Final   Culture NO GROWTH 3 DAYS  Final   Report Status PENDING  Incomplete  Blood Culture (routine x 2)     Status: Abnormal (Preliminary result)   Collection Time: 06/29/15 10:55 PM  Result Value Ref Range Status   Specimen Description BLOOD LEFT ASSIST CONTROL  Final   Special Requests   Final    BOTTLES DRAWN AEROBIC AND ANAEROBIC 10CCAERO,10CCANA   Culture  Setup Time   Final    GRAM NEGATIVE RODS IN BOTH AEROBIC AND ANAEROBIC BOTTLES CRITICAL RESULT CALLED TO, READ BACK BY AND VERIFIED WITH: Princella Ion @ 1515 06/30/15 by [facility]    Culture (A)  Final    KLEBSIELLA PNEUMONIAE IN BOTH AEROBIC AND ANAEROBIC BOTTLES REPEATING SUSCEPTIBILTIES    Report Status PENDING  Incomplete  Blood Culture ID Panel (Reflexed)     Status: Abnormal   Collection Time: 06/29/15 10:55 PM  Result Value Ref Range Status   Enterococcus species NOT DETECTED NOT DETECTED Final   Vancomycin resistance NOT DETECTED NOT DETECTED Final   Listeria monocytogenes NOT DETECTED NOT DETECTED Final   Staphylococcus species NOT DETECTED NOT DETECTED Final   Staphylococcus aureus NOT DETECTED NOT DETECTED Final   Methicillin resistance NOT DETECTED NOT DETECTED Final   Streptococcus species NOT DETECTED NOT DETECTED Final   Streptococcus agalactiae NOT DETECTED NOT DETECTED Final   Streptococcus pneumoniae NOT DETECTED NOT DETECTED Final   Streptococcus pyogenes NOT DETECTED NOT DETECTED Final   Acinetobacter baumannii NOT DETECTED NOT DETECTED Final    Enterobacteriaceae species DETECTED (A) NOT DETECTED Final    Comment: CRITICAL RESULT CALLED TO, READ BACK BY AND VERIFIED WITH: Princella Ion @ 1515 06/30/15 by [facility]    Enterobacter cloacae complex NOT DETECTED NOT DETECTED Final   Escherichia coli NOT DETECTED NOT DETECTED Final   Klebsiella oxytoca NOT DETECTED NOT DETECTED Final   Klebsiella pneumoniae DETECTED (A) NOT DETECTED  Final    Comment: CRITICAL RESULT CALLED TO, READ BACK BY AND VERIFIED WITH: Princella Ion @ 1610 06/30/15 by [facility]    Proteus species NOT DETECTED NOT DETECTED Final   Serratia marcescens NOT DETECTED NOT DETECTED Final   Carbapenem resistance NOT DETECTED NOT DETECTED Final   Haemophilus influenzae NOT DETECTED NOT DETECTED Final   Neisseria meningitidis NOT DETECTED NOT DETECTED Final   Pseudomonas aeruginosa NOT DETECTED NOT DETECTED Final   Candida albicans NOT DETECTED NOT DETECTED Final   Candida glabrata NOT DETECTED NOT DETECTED Final   Candida krusei NOT DETECTED NOT DETECTED Final   Candida parapsilosis NOT DETECTED NOT DETECTED Final   Candida tropicalis NOT DETECTED NOT DETECTED Final    RADIOLOGY:  No results found.      Management plans discussed with the patient, his wife and they are in agreement.  CODE STATUS:     Code Status Orders        Start     Ordered   06/29/15 2349  Full code   Continuous     06/29/15 2349    Code Status History    Date Active Date Inactive Code Status Order ID Comments User Context   This patient has a current code status but no historical code status.      TOTAL TIME TAKING CARE OF THIS PATIENT: 36 minutes.    Shaune Pollack M.D on 07/02/2015 at 3:48 PM  Between 7am to 6pm - Pager - [phone]  After 6pm go to [email or web address] - password [email or web address] [facility]  [facility]  Office  [phone]  CC: Primary care physician; Vonita Moss, MD

## 2015-07-03 LAB — URINE CULTURE: Culture: >=100000 — AB

## 2015-07-03 LAB — CULTURE, BLOOD (ROUTINE X 2)

## 2015-07-04 LAB — CULTURE, BLOOD (ROUTINE X 2): Culture: NO GROWTH

## 2015-07-24 DIAGNOSIS — R809 Proteinuria, unspecified: Secondary | ICD-10-CM | POA: Diagnosis not present

## 2015-07-24 DIAGNOSIS — N183 Chronic kidney disease, stage 3 (moderate): Secondary | ICD-10-CM | POA: Diagnosis not present

## 2015-07-24 DIAGNOSIS — E1129 Type 2 diabetes mellitus with other diabetic kidney complication: Secondary | ICD-10-CM | POA: Diagnosis not present

## 2015-07-24 DIAGNOSIS — Z7982 Long term (current) use of aspirin: Secondary | ICD-10-CM | POA: Diagnosis not present

## 2015-07-24 DIAGNOSIS — J410 Simple chronic bronchitis: Secondary | ICD-10-CM | POA: Diagnosis not present

## 2015-07-24 DIAGNOSIS — Z794 Long term (current) use of insulin: Secondary | ICD-10-CM | POA: Diagnosis not present

## 2015-07-24 DIAGNOSIS — J449 Chronic obstructive pulmonary disease, unspecified: Secondary | ICD-10-CM | POA: Diagnosis not present

## 2015-07-24 DIAGNOSIS — Z87891 Personal history of nicotine dependence: Secondary | ICD-10-CM | POA: Diagnosis not present

## 2015-07-24 DIAGNOSIS — R5383 Other fatigue: Secondary | ICD-10-CM | POA: Diagnosis not present

## 2015-07-24 DIAGNOSIS — D649 Anemia, unspecified: Secondary | ICD-10-CM | POA: Diagnosis not present

## 2015-10-26 DIAGNOSIS — J42 Unspecified chronic bronchitis: Secondary | ICD-10-CM | POA: Diagnosis not present

## 2015-10-26 DIAGNOSIS — J449 Chronic obstructive pulmonary disease, unspecified: Secondary | ICD-10-CM | POA: Diagnosis not present

## 2015-10-26 DIAGNOSIS — R001 Bradycardia, unspecified: Secondary | ICD-10-CM | POA: Diagnosis not present

## 2015-10-26 DIAGNOSIS — I10 Essential (primary) hypertension: Secondary | ICD-10-CM | POA: Diagnosis not present

## 2015-10-26 DIAGNOSIS — Z951 Presence of aortocoronary bypass graft: Secondary | ICD-10-CM | POA: Diagnosis not present

## 2015-10-26 DIAGNOSIS — I2510 Atherosclerotic heart disease of native coronary artery without angina pectoris: Secondary | ICD-10-CM | POA: Diagnosis not present

## 2015-10-26 DIAGNOSIS — Z79899 Other long term (current) drug therapy: Secondary | ICD-10-CM | POA: Diagnosis not present

## 2015-10-26 DIAGNOSIS — Z794 Long term (current) use of insulin: Secondary | ICD-10-CM | POA: Diagnosis not present

## 2015-10-26 DIAGNOSIS — N400 Enlarged prostate without lower urinary tract symptoms: Secondary | ICD-10-CM | POA: Diagnosis not present

## 2015-10-26 DIAGNOSIS — J410 Simple chronic bronchitis: Secondary | ICD-10-CM | POA: Diagnosis not present

## 2015-10-26 DIAGNOSIS — E1140 Type 2 diabetes mellitus with diabetic neuropathy, unspecified: Secondary | ICD-10-CM | POA: Diagnosis not present

## 2016-01-25 DIAGNOSIS — E1140 Type 2 diabetes mellitus with diabetic neuropathy, unspecified: Secondary | ICD-10-CM | POA: Diagnosis not present

## 2016-01-25 DIAGNOSIS — Z794 Long term (current) use of insulin: Secondary | ICD-10-CM | POA: Diagnosis not present

## 2016-01-25 DIAGNOSIS — Z23 Encounter for immunization: Secondary | ICD-10-CM | POA: Diagnosis not present

## 2016-01-25 DIAGNOSIS — I10 Essential (primary) hypertension: Secondary | ICD-10-CM | POA: Diagnosis not present

## 2016-01-25 DIAGNOSIS — J410 Simple chronic bronchitis: Secondary | ICD-10-CM | POA: Diagnosis not present

## 2016-01-25 DIAGNOSIS — I2510 Atherosclerotic heart disease of native coronary artery without angina pectoris: Secondary | ICD-10-CM | POA: Diagnosis not present

## 2016-03-19 ENCOUNTER — Ambulatory Visit: Payer: Self-pay | Admitting: *Deleted

## 2016-06-12 DIAGNOSIS — J410 Simple chronic bronchitis: Secondary | ICD-10-CM | POA: Diagnosis not present

## 2016-06-12 DIAGNOSIS — N183 Chronic kidney disease, stage 3 (moderate): Secondary | ICD-10-CM | POA: Diagnosis not present

## 2016-06-12 DIAGNOSIS — R202 Paresthesia of skin: Secondary | ICD-10-CM | POA: Diagnosis not present

## 2016-06-12 DIAGNOSIS — E1122 Type 2 diabetes mellitus with diabetic chronic kidney disease: Secondary | ICD-10-CM | POA: Diagnosis not present

## 2016-06-12 DIAGNOSIS — N138 Other obstructive and reflux uropathy: Secondary | ICD-10-CM | POA: Diagnosis not present

## 2016-06-12 DIAGNOSIS — J449 Chronic obstructive pulmonary disease, unspecified: Secondary | ICD-10-CM | POA: Diagnosis not present

## 2016-06-12 DIAGNOSIS — R3914 Feeling of incomplete bladder emptying: Secondary | ICD-10-CM | POA: Diagnosis not present

## 2016-06-12 DIAGNOSIS — I10 Essential (primary) hypertension: Secondary | ICD-10-CM | POA: Diagnosis not present

## 2016-06-12 DIAGNOSIS — E1140 Type 2 diabetes mellitus with diabetic neuropathy, unspecified: Secondary | ICD-10-CM | POA: Insufficient documentation

## 2016-06-12 DIAGNOSIS — I129 Hypertensive chronic kidney disease with stage 1 through stage 4 chronic kidney disease, or unspecified chronic kidney disease: Secondary | ICD-10-CM | POA: Diagnosis not present

## 2016-06-12 DIAGNOSIS — N401 Enlarged prostate with lower urinary tract symptoms: Secondary | ICD-10-CM | POA: Diagnosis not present

## 2016-06-12 DIAGNOSIS — N139 Obstructive and reflux uropathy, unspecified: Secondary | ICD-10-CM | POA: Diagnosis not present

## 2016-06-12 DIAGNOSIS — N400 Enlarged prostate without lower urinary tract symptoms: Secondary | ICD-10-CM | POA: Diagnosis not present

## 2016-06-12 DIAGNOSIS — E1142 Type 2 diabetes mellitus with diabetic polyneuropathy: Secondary | ICD-10-CM | POA: Diagnosis not present

## 2016-06-12 DIAGNOSIS — Z794 Long term (current) use of insulin: Secondary | ICD-10-CM | POA: Diagnosis not present

## 2016-06-12 DIAGNOSIS — I2510 Atherosclerotic heart disease of native coronary artery without angina pectoris: Secondary | ICD-10-CM | POA: Diagnosis not present

## 2017-02-11 DIAGNOSIS — J441 Chronic obstructive pulmonary disease with (acute) exacerbation: Secondary | ICD-10-CM | POA: Diagnosis not present

## 2017-03-17 DIAGNOSIS — I219 Acute myocardial infarction, unspecified: Secondary | ICD-10-CM

## 2017-03-17 HISTORY — DX: Acute myocardial infarction, unspecified: I21.9

## 2017-03-27 ENCOUNTER — Encounter: Payer: Self-pay | Admitting: Emergency Medicine

## 2017-03-27 ENCOUNTER — Emergency Department: Payer: Medicare HMO

## 2017-03-27 ENCOUNTER — Inpatient Hospital Stay
Admission: EM | Admit: 2017-03-27 | Discharge: 2017-03-31 | DRG: 247 | Disposition: A | Discharge status: Home / Self Care | Payer: Medicare HMO | Attending: Internal Medicine | Admitting: Internal Medicine

## 2017-03-27 ENCOUNTER — Other Ambulatory Visit: Payer: Self-pay

## 2017-03-27 DIAGNOSIS — R05 Cough: Secondary | ICD-10-CM | POA: Diagnosis not present

## 2017-03-27 DIAGNOSIS — R079 Chest pain, unspecified: Secondary | ICD-10-CM | POA: Diagnosis present

## 2017-03-27 DIAGNOSIS — J209 Acute bronchitis, unspecified: Secondary | ICD-10-CM | POA: Diagnosis present

## 2017-03-27 DIAGNOSIS — Z951 Presence of aortocoronary bypass graft: Secondary | ICD-10-CM | POA: Diagnosis not present

## 2017-03-27 DIAGNOSIS — Z955 Presence of coronary angioplasty implant and graft: Secondary | ICD-10-CM | POA: Diagnosis not present

## 2017-03-27 DIAGNOSIS — E119 Type 2 diabetes mellitus without complications: Secondary | ICD-10-CM | POA: Diagnosis not present

## 2017-03-27 DIAGNOSIS — Z7982 Long term (current) use of aspirin: Secondary | ICD-10-CM

## 2017-03-27 DIAGNOSIS — I25790 Atherosclerosis of other coronary artery bypass graft(s) with unstable angina pectoris: Secondary | ICD-10-CM | POA: Diagnosis not present

## 2017-03-27 DIAGNOSIS — N400 Enlarged prostate without lower urinary tract symptoms: Secondary | ICD-10-CM | POA: Diagnosis present

## 2017-03-27 DIAGNOSIS — J449 Chronic obstructive pulmonary disease, unspecified: Secondary | ICD-10-CM | POA: Diagnosis not present

## 2017-03-27 DIAGNOSIS — I252 Old myocardial infarction: Secondary | ICD-10-CM

## 2017-03-27 DIAGNOSIS — I509 Heart failure, unspecified: Secondary | ICD-10-CM | POA: Diagnosis not present

## 2017-03-27 DIAGNOSIS — I2510 Atherosclerotic heart disease of native coronary artery without angina pectoris: Secondary | ICD-10-CM | POA: Diagnosis not present

## 2017-03-27 DIAGNOSIS — R778 Other specified abnormalities of plasma proteins: Secondary | ICD-10-CM | POA: Diagnosis not present

## 2017-03-27 DIAGNOSIS — I25119 Atherosclerotic heart disease of native coronary artery with unspecified angina pectoris: Secondary | ICD-10-CM | POA: Diagnosis present

## 2017-03-27 DIAGNOSIS — I10 Essential (primary) hypertension: Secondary | ICD-10-CM | POA: Diagnosis not present

## 2017-03-27 DIAGNOSIS — Z9114 Patient's other noncompliance with medication regimen: Secondary | ICD-10-CM

## 2017-03-27 DIAGNOSIS — Z794 Long term (current) use of insulin: Secondary | ICD-10-CM

## 2017-03-27 DIAGNOSIS — E782 Mixed hyperlipidemia: Secondary | ICD-10-CM | POA: Diagnosis present

## 2017-03-27 DIAGNOSIS — E1122 Type 2 diabetes mellitus with diabetic chronic kidney disease: Secondary | ICD-10-CM | POA: Diagnosis present

## 2017-03-27 DIAGNOSIS — Z87891 Personal history of nicotine dependence: Secondary | ICD-10-CM

## 2017-03-27 DIAGNOSIS — E118 Type 2 diabetes mellitus with unspecified complications: Secondary | ICD-10-CM | POA: Diagnosis not present

## 2017-03-27 DIAGNOSIS — R7989 Other specified abnormal findings of blood chemistry: Secondary | ICD-10-CM

## 2017-03-27 DIAGNOSIS — I214 Non-ST elevation (NSTEMI) myocardial infarction: Principal | ICD-10-CM | POA: Diagnosis present

## 2017-03-27 DIAGNOSIS — Z79899 Other long term (current) drug therapy: Secondary | ICD-10-CM

## 2017-03-27 DIAGNOSIS — J440 Chronic obstructive pulmonary disease with acute lower respiratory infection: Secondary | ICD-10-CM | POA: Diagnosis present

## 2017-03-27 DIAGNOSIS — N183 Chronic kidney disease, stage 3 (moderate): Secondary | ICD-10-CM | POA: Diagnosis present

## 2017-03-27 DIAGNOSIS — Z9889 Other specified postprocedural states: Secondary | ICD-10-CM | POA: Diagnosis not present

## 2017-03-27 DIAGNOSIS — I129 Hypertensive chronic kidney disease with stage 1 through stage 4 chronic kidney disease, or unspecified chronic kidney disease: Secondary | ICD-10-CM | POA: Diagnosis present

## 2017-03-27 DIAGNOSIS — I700 Atherosclerosis of aorta: Secondary | ICD-10-CM | POA: Diagnosis not present

## 2017-03-27 DIAGNOSIS — Z8249 Family history of ischemic heart disease and other diseases of the circulatory system: Secondary | ICD-10-CM | POA: Diagnosis not present

## 2017-03-27 DIAGNOSIS — I200 Unstable angina: Secondary | ICD-10-CM | POA: Diagnosis not present

## 2017-03-27 LAB — HEMOGLOBIN A1C
Hgb A1c MFr Bld: 12.2 % — ABNORMAL HIGH (ref 4.8–5.6)
Mean Plasma Glucose: 303.44 mg/dL

## 2017-03-27 LAB — BASIC METABOLIC PANEL
ANION GAP: 12 (ref 5–15)
BUN: 29 mg/dL — ABNORMAL HIGH (ref 6–20)
CALCIUM: 8.9 mg/dL (ref 8.9–10.3)
CO2: 21 mmol/L — AB (ref 22–32)
Chloride: 97 mmol/L — ABNORMAL LOW (ref 101–111)
Creatinine, Ser: 1.76 mg/dL — ABNORMAL HIGH (ref 0.61–1.24)
GFR calc Af Amer: 42 mL/min — ABNORMAL LOW (ref >60)
GFR calc non Af Amer: 37 mL/min — ABNORMAL LOW (ref >60)
GLUCOSE: 440 mg/dL — AB (ref 65–99)
POTASSIUM: 4.6 mmol/L (ref 3.5–5.1)
Sodium: 130 mmol/L — ABNORMAL LOW (ref 135–145)

## 2017-03-27 LAB — CREATININE, SERUM
CREATININE: 1.86 mg/dL — AB (ref 0.61–1.24)
GFR calc Af Amer: 40 mL/min — ABNORMAL LOW (ref >60)
GFR calc non Af Amer: 34 mL/min — ABNORMAL LOW (ref >60)

## 2017-03-27 LAB — LIPID PANEL
Cholesterol: 218 mg/dL — ABNORMAL HIGH (ref 0–200)
HDL: 35 mg/dL — ABNORMAL LOW (ref >40)
LDL CALC: 123 mg/dL — AB (ref 0–99)
Total CHOL/HDL Ratio: 6.2 RATIO
Triglycerides: 299 mg/dL — ABNORMAL HIGH (ref <150)
VLDL: 60 mg/dL — ABNORMAL HIGH (ref 0–40)

## 2017-03-27 LAB — CBC
HEMATOCRIT: 43.4 % (ref 40.0–52.0)
HEMOGLOBIN: 14.6 g/dL (ref 13.0–18.0)
MCH: 29.8 pg (ref 26.0–34.0)
MCHC: 33.6 g/dL (ref 32.0–36.0)
MCV: 88.5 fL (ref 80.0–100.0)
Platelets: 296 10*3/uL (ref 150–440)
RBC: 4.9 MIL/uL (ref 4.40–5.90)
RDW: 13.4 % (ref 11.5–14.5)
WBC: 12.5 10*3/uL — ABNORMAL HIGH (ref 3.8–10.6)

## 2017-03-27 LAB — INFLUENZA PANEL BY PCR (TYPE A & B)
INFLAPCR: NEGATIVE
INFLBPCR: NEGATIVE

## 2017-03-27 LAB — GLUCOSE, CAPILLARY
GLUCOSE-CAPILLARY: 407 mg/dL — AB (ref 65–99)
Glucose-Capillary: 408 mg/dL — ABNORMAL HIGH (ref 65–99)
Glucose-Capillary: 477 mg/dL — ABNORMAL HIGH (ref 65–99)
Glucose-Capillary: 394 mg/dL — ABNORMAL HIGH (ref 65–99)

## 2017-03-27 LAB — TROPONIN I
TROPONIN I: 0.1 ng/mL — AB (ref <0.03)
Troponin I: 0.09 ng/mL (ref <0.03)
Troponin I: 0.11 ng/mL (ref <0.03)
Troponin I: 0.25 ng/mL (ref <0.03)

## 2017-03-27 MED ORDER — IPRATROPIUM-ALBUTEROL 0.5-2.5 (3) MG/3ML IN SOLN
RESPIRATORY_TRACT | Status: AC
  Filled 2017-03-27: qty 3

## 2017-03-27 MED ORDER — IPRATROPIUM-ALBUTEROL 0.5-2.5 (3) MG/3ML IN SOLN
3.0000 mL | RESPIRATORY_TRACT | Status: DC
  Administered 2017-03-27 – 2017-03-28 (×5): 3 mL via RESPIRATORY_TRACT
  Filled 2017-03-27 (×4): qty 3

## 2017-03-27 MED ORDER — ACETAMINOPHEN 325 MG PO TABS
650.0000 mg | ORAL_TABLET | Freq: Four times a day (QID) | ORAL | Status: DC | PRN
  Administered 2017-03-27 – 2017-03-31 (×5): 650 mg via ORAL
  Filled 2017-03-27 (×5): qty 2

## 2017-03-27 MED ORDER — TIOTROPIUM BROMIDE MONOHYDRATE 18 MCG IN CAPS
18.0000 ug | ORAL_CAPSULE | Freq: Every day | RESPIRATORY_TRACT | Status: DC
  Administered 2017-03-27 – 2017-03-31 (×5): 18 ug via RESPIRATORY_TRACT
  Filled 2017-03-27: qty 5

## 2017-03-27 MED ORDER — INSULIN GLARGINE 100 UNIT/ML ~~LOC~~ SOLN
50.0000 [IU] | Freq: Every day | SUBCUTANEOUS | Status: DC
  Administered 2017-03-27 – 2017-03-29 (×3): 50 [IU] via SUBCUTANEOUS
  Filled 2017-03-27 (×4): qty 0.5

## 2017-03-27 MED ORDER — SIMVASTATIN 20 MG PO TABS
40.0000 mg | ORAL_TABLET | Freq: Every day | ORAL | Status: DC
  Administered 2017-03-27 – 2017-03-31 (×5): 40 mg via ORAL
  Filled 2017-03-27 (×5): qty 2

## 2017-03-27 MED ORDER — NITROGLYCERIN 0.4 MG SL SUBL
0.4000 mg | SUBLINGUAL_TABLET | SUBLINGUAL | Status: DC | PRN
  Administered 2017-03-29 (×2): 0.4 mg via SUBLINGUAL
  Filled 2017-03-27 (×2): qty 1

## 2017-03-27 MED ORDER — ASPIRIN 81 MG PO CHEW
324.0000 mg | CHEWABLE_TABLET | Freq: Once | ORAL | Status: AC
  Administered 2017-03-27: 324 mg via ORAL
  Filled 2017-03-27: qty 4

## 2017-03-27 MED ORDER — ASPIRIN EC 81 MG PO TBEC
81.0000 mg | DELAYED_RELEASE_TABLET | Freq: Every day | ORAL | Status: DC
  Administered 2017-03-28 – 2017-03-29 (×2): 81 mg via ORAL
  Filled 2017-03-27 (×2): qty 1

## 2017-03-27 MED ORDER — SODIUM CHLORIDE 0.9 % IV SOLN
1000.0000 mL | Freq: Once | INTRAVENOUS | Status: AC
  Administered 2017-03-27: 1000 mL via INTRAVENOUS

## 2017-03-27 MED ORDER — INSULIN ASPART 100 UNIT/ML ~~LOC~~ SOLN
0.0000 [IU] | Freq: Three times a day (TID) | SUBCUTANEOUS | Status: DC
  Administered 2017-03-28: 5 [IU] via SUBCUTANEOUS
  Administered 2017-03-28 (×2): 9 [IU] via SUBCUTANEOUS
  Administered 2017-03-29 (×2): 3 [IU] via SUBCUTANEOUS
  Administered 2017-03-29: 7 [IU] via SUBCUTANEOUS
  Administered 2017-03-30: 3 [IU] via SUBCUTANEOUS
  Administered 2017-03-30: 9 [IU] via SUBCUTANEOUS
  Administered 2017-03-31: 3 [IU] via SUBCUTANEOUS
  Administered 2017-03-31: 1 [IU] via SUBCUTANEOUS
  Filled 2017-03-27 (×10): qty 1

## 2017-03-27 MED ORDER — METOPROLOL TARTRATE 25 MG PO TABS
25.0000 mg | ORAL_TABLET | Freq: Every day | ORAL | Status: DC
  Administered 2017-03-28 – 2017-03-31 (×4): 25 mg via ORAL
  Filled 2017-03-27 (×4): qty 1

## 2017-03-27 MED ORDER — INSULIN ASPART 100 UNIT/ML ~~LOC~~ SOLN
0.0000 [IU] | Freq: Three times a day (TID) | SUBCUTANEOUS | Status: DC
Start: 2017-03-27 — End: 2017-03-27
  Administered 2017-03-27: 10 [IU] via SUBCUTANEOUS

## 2017-03-27 MED ORDER — HEPARIN SODIUM (PORCINE) 5000 UNIT/ML IJ SOLN
5000.0000 [IU] | Freq: Three times a day (TID) | INTRAMUSCULAR | Status: DC
  Administered 2017-03-27: 5000 [IU] via SUBCUTANEOUS
  Filled 2017-03-27: qty 1

## 2017-03-27 MED ORDER — ENALAPRIL MALEATE 10 MG PO TABS
20.0000 mg | ORAL_TABLET | Freq: Every day | ORAL | Status: DC
  Administered 2017-03-27 – 2017-03-31 (×5): 20 mg via ORAL
  Filled 2017-03-27 (×5): qty 2

## 2017-03-27 MED ORDER — DOCUSATE SODIUM 100 MG PO CAPS: 100.0000 mg | ORAL_CAPSULE | Freq: Two times a day (BID) | ORAL | Status: DC | PRN

## 2017-03-27 MED ORDER — FINASTERIDE 5 MG PO TABS
5.0000 mg | ORAL_TABLET | Freq: Every day | ORAL | Status: DC
  Administered 2017-03-27 – 2017-03-30 (×4): 5 mg via ORAL
  Filled 2017-03-27 (×4): qty 1

## 2017-03-27 MED ORDER — INSULIN ASPART 100 UNIT/ML ~~LOC~~ SOLN
SUBCUTANEOUS | Status: AC
  Filled 2017-03-27: qty 1

## 2017-03-27 MED ORDER — DOXAZOSIN MESYLATE 2 MG PO TABS
2.0000 mg | ORAL_TABLET | Freq: Every day | ORAL | Status: DC
  Administered 2017-03-27 – 2017-03-31 (×5): 2 mg via ORAL
  Filled 2017-03-27 (×5): qty 1

## 2017-03-27 MED ORDER — INSULIN ASPART 100 UNIT/ML ~~LOC~~ SOLN
0.0000 [IU] | Freq: Every day | SUBCUTANEOUS | Status: DC
  Administered 2017-03-27: 7 [IU] via SUBCUTANEOUS
  Administered 2017-03-28 – 2017-03-29 (×2): 3 [IU] via SUBCUTANEOUS
  Administered 2017-03-30: 2 [IU] via SUBCUTANEOUS
  Filled 2017-03-27 (×4): qty 1

## 2017-03-27 MED ORDER — GUAIFENESIN 100 MG/5ML PO SOLN
5.0000 mL | ORAL | Status: DC | PRN
  Filled 2017-03-27: qty 5

## 2017-03-27 NOTE — ED Notes (Signed)
Per Sasha RN on 2A, room is still being cleaned. Awaiting call back from [facility] RN to take pt to 2A

## 2017-03-27 NOTE — Progress Notes (Signed)
CRITICAL VALUE ALERT  Critical Value:  Troponin 0.25  Date & Time Notied:  03/27/2017  Provider Notified: Dr. Anne Hahn   Orders Received/Actions taken: per MD to start Heparin drip and to do serial troponin. Patient denies chest pain. RN will continue to monitor.

## 2017-03-27 NOTE — ED Triage Notes (Signed)
Pt reports that he has had chest pain and cough for the last week, he went to his PMD they gave him medication but it is not getting any better. He reports that he took a Nitro today and it eased up his pain.

## 2017-03-27 NOTE — Progress Notes (Signed)
Talked to Dr. Renae Gloss about patient's blood sugar of 407 order to give Novolog 7 units, started him on bedtime sliding scale also is receiving 50 units of Lantus. Patient will be NPO at midnight for possible stress test tomorrow. RN will continue to monitor.

## 2017-03-27 NOTE — H&P (Signed)
[facility] - [facility] at [facility]   PATIENT NAME: Benjamin Chan    MR#:  [id number]  DATE OF BIRTH:  08/29/1945  DATE OF ADMISSION:  03/27/2017  PRIMARY CARE PHYSICIAN: Steele Sizer, MD   REQUESTING/REFERRING PHYSICIAN: Kinner  CHIEF COMPLAINT:   Chief Complaint  Patient presents with  . Chest Pain    HISTORY OF PRESENT ILLNESS: Benjamin Chan  is a 72 y.o. male with a known history of BPH, coronary artery disease status post CABG, chronic kidney disease, COPD, diabetes, hypertension- ran out off his medicines for last 1 month. In could not go to doctor's appointment as his wife is a dialysis patient and he need to take care of her. So for last 1 month he is not taking any other medicines. For last 2-3 days he started having pain in left side of his chest which is pressure-like and intermittent. No aggravating factors but it lasts for a few minutes and nitroglycerin tablet help to relieve the pain. He spoke to his son about this and he suggested to bring him to emergency room.  In ER his troponin is negative but because of his typical cardiac history and complaint of chest pain which is relieved by nitroglycerin, ER physician suggested to observe in medical service.  PAST MEDICAL HISTORY:   Past Medical History:  Diagnosis Date  . BPH (benign prostatic hyperplasia)   . CAD (coronary artery disease)   . Chronic kidney disease   . COPD (chronic obstructive pulmonary disease) (HCC)   . Diabetes mellitus without complication (HCC)   . Hypertension     PAST SURGICAL HISTORY:  Past Surgical History:  Procedure Laterality Date  . CORONARY ARTERY BYPASS GRAFT    . INGUINAL HERNIA REPAIR     right    SOCIAL HISTORY:  Social History   Tobacco Use  . Smoking status: Former Smoker  Substance Use Topics  . Alcohol use: No    FAMILY HISTORY:  Family History  Problem Relation Age of Onset  . Hypertension Father     DRUG ALLERGIES: No Known  Allergies  REVIEW OF SYSTEMS:   CONSTITUTIONAL: No fever, fatigue or weakness.  EYES: No blurred or double vision.  EARS, NOSE, AND THROAT: No tinnitus or ear pain.  RESPIRATORY: No cough, shortness of breath, wheezing or hemoptysis.  CARDIOVASCULAR: Positive for chest pain, no orthopnea, edema.  GASTROINTESTINAL: No nausea, vomiting, diarrhea or abdominal pain.  GENITOURINARY: No dysuria, hematuria.  ENDOCRINE: No polyuria, nocturia,  HEMATOLOGY: No anemia, easy bruising or bleeding SKIN: No rash or lesion. MUSCULOSKELETAL: No joint pain or arthritis.   NEUROLOGIC: No tingling, numbness, weakness.  PSYCHIATRY: No anxiety or depression.   MEDICATIONS AT HOME:  Prior to Admission medications   Medication Sig Start Date End Date Taking? Authorizing Provider  acetaminophen (TYLENOL) 325 MG tablet Take 650 mg by mouth every 6 (six) hours as needed.    [provider]  aspirin 81 MG tablet Take 81 mg by mouth daily. 02/19/10   [provider]  cephALEXin (KEFLEX) 500 MG capsule Take 1 capsule (500 mg total) by mouth 3 (three) times daily. 07/02/15   Shaune Pollack, MD  chlorthalidone (HYGROTON) 25 MG tablet Take 25 mg by mouth daily.    [provider]  doxazosin (CARDURA) 2 MG tablet Take 2 mg by mouth daily.    [provider]  enalapril (VASOTEC) 20 MG tablet Take 1 tablet by mouth daily. 04/30/15   [provider]  finasteride (PROSCAR) 5 MG tablet Take 1 tablet by mouth daily. 03/27/15   [provider]  insulin glargine (LANTUS) 100 UNIT/ML injection Inject 50 Units into the skin at bedtime.    [provider]  metoprolol tartrate (LOPRESSOR) 25 MG tablet Take 1 tablet by mouth daily. 04/04/15   [provider]  nitroGLYCERIN (NITROSTAT) 0.4 MG SL tablet Place 0.4 mg under the tongue. 05/16/14   [provider]  simvastatin (ZOCOR) 40 MG tablet Take 40 mg by mouth daily. 05/16/14   [provider]   tiotropium (SPIRIVA) 18 MCG inhalation capsule Place 18 mcg into inhaler and inhale daily.    [provider]  VENTOLIN HFA 108 (90 Base) MCG/ACT inhaler Inhale 2 puffs into the lungs daily. 04/25/15   [provider]      PHYSICAL EXAMINATION:   VITAL SIGNS: Blood pressure 136/74, pulse 76, temperature 98.4 F (36.9 C), temperature source Oral, resp. rate (!) 21, height 5\' 6"  (1.676 m), weight 97.5 kg (215 lb), SpO2 96 %.  GENERAL:  72 y.o.-year-old patient lying in the bed with no acute distress.  EYES: Pupils equal, round, reactive to light and accommodation. No scleral icterus. Extraocular muscles intact.  HEENT: Head atraumatic, normocephalic. Oropharynx and nasopharynx clear.  NECK:  Supple, no jugular venous distention. No thyroid enlargement, no tenderness.  LUNGS: Normal breath sounds bilaterally, no wheezing, rales,rhonchi or crepitation. No use of accessory muscles of respiration.  CARDIOVASCULAR: S1, S2 normal. No murmurs, rubs, or gallops.  ABDOMEN: Soft, nontender, nondistended. Bowel sounds present. No organomegaly or mass.  EXTREMITIES: No pedal edema, cyanosis, or clubbing.  NEUROLOGIC: Cranial nerves II through XII are intact. Muscle strength 5/5 in all extremities. Sensation intact. Gait not checked.  PSYCHIATRIC: The patient is alert and oriented x 3.  SKIN: No obvious rash, lesion, or ulcer.   LABORATORY PANEL:   CBC Recent Labs  Lab 03/27/17 1300  WBC 12.5*  HGB 14.6  HCT 43.4  PLT 296  MCV 88.5  MCH 29.8  MCHC 33.6  RDW 13.4   ------------------------------------------------------------------------------------------------------------------  Chemistries  Recent Labs  Lab 03/27/17 1300  NA 130*  K 4.6  CL 97*  CO2 21*  GLUCOSE 440*  BUN 29*  CREATININE 1.76*  CALCIUM 8.9   ------------------------------------------------------------------------------------------------------------------ estimated creatinine clearance is 40.9  mL/min (A) (by C-G formula based on SCr of 1.76 mg/dL (H)). ------------------------------------------------------------------------------------------------------------------ No results for input(s): TSH, T4TOTAL, T3FREE, THYROIDAB in the last 72 hours.  Invalid input(s): FREET3   Coagulation profile No results for input(s): INR, PROTIME in the last 168 hours. ------------------------------------------------------------------------------------------------------------------- No results for input(s): DDIMER in the last 72 hours. -------------------------------------------------------------------------------------------------------------------  Cardiac Enzymes Recent Labs  Lab 03/27/17 1300  TROPONINI 0.09*   ------------------------------------------------------------------------------------------------------------------ Invalid input(s): POCBNP  ---------------------------------------------------------------------------------------------------------------  Urinalysis    Component Value Date/Time   COLORURINE YELLOW (A) 06/29/2015 2241   APPEARANCEUR HAZY (A) 06/29/2015 2241   LABSPEC 1.009 06/29/2015 2241   PHURINE 5.0 06/29/2015 2241   GLUCOSEU >500 (A) 06/29/2015 2241   HGBUR 2+ (A) 06/29/2015 2241   BILIRUBINUR NEGATIVE 06/29/2015 2241   KETONESUR NEGATIVE 06/29/2015 2241   PROTEINUR NEGATIVE 06/29/2015 2241   NITRITE NEGATIVE 06/29/2015 2241   LEUKOCYTESUR 3+ (A) 06/29/2015 2241     RADIOLOGY: Dg Chest 2 View  Result Date: 03/27/2017 CLINICAL DATA:  Chest pressure, pain with eating, shortness of breath, headache, cough, and congestion for 2 weeks, CHF, COPD, diabetes mellitus EXAM: CHEST  2 VIEW COMPARISON:  06/29/2015 FINDINGS: Normal heart  size post CABG. Atherosclerotic calcification aorta. Mediastinal contours and pulmonary vascularity normal. Mild RIGHT apical scarring. Lungs otherwise clear. No infiltrate, pleural effusion or pneumothorax. No acute osseous  findings. IMPRESSION: RIGHT apical scarring. No acute abnormalities. Electronically Signed   By: Ulyses Southward M.D.   On: 03/27/2017 13:46    EKG: Orders placed or performed during the hospital encounter of 03/27/17  . EKG 12-Lead  . EKG 12-Lead  . ED EKG within 10 minutes  . ED EKG within 10 minutes  Normal sinus rhythm, no ST-T changes.  IMPRESSION AND PLAN:  * Anginal chest pain    History of coronary artery disease and medication noncompliance.    Monitor on telemetry, follow serial troponin, stress test tomorrow morning- if troponins are negative.   Cardiology consult.   Resume aspirin, metoprolol, simvastatin.   Nitroglycerin as needed for chest pain.  * Hypertension   Continue metoprolol, hold enalapril because of worsening renal function.  * Hyperlipidemia   Continue simvastatin.  * Diabetes   Continue Lantus, keep on sliding scale coverage.   Blood sugar is high currently, check hemoglobin A1c.  * COPD   Currently no active wheezing, continue Spiriva and may give DuoNeb treatments.  * Coughing   Said for last 2 weeks he had coughing, went to primary care physician and was given some tablets but it did not help much. I will check her for influenza PCR and gave her some Robitussin.  All the records are reviewed and case discussed with ED provider. Management plans discussed with the patient, family and they are in agreement.  CODE STATUS: Full code Code Status History    Date Active Date Inactive Code Status Order ID Comments User Context   06/29/2015 23:49 07/02/2015 20:00 Full Code [id number]  Ihor Austin, MD ED      His son is present in the room during my visit.  TOTAL TIME TAKING CARE OF THIS PATIENT: 45 minutes.    Altamese Dilling M.D on 03/27/2017   Between 7am to 6pm - Pager - [phone]  After 6pm go to [email or web address] - password [email or web address]  [facility]  Office  [phone]  CC: Primary care physician; Steele Sizer,  MD   Note: This dictation was prepared with Dragon dictation along with smaller phrase technology. Any transcriptional errors that result from this process are unintentional.

## 2017-03-27 NOTE — ED Notes (Signed)
Pt states intermittent central CP with no radiation x 3 weeks. States productive cough as well. States CP while coughing. Pt states he saw his PCP and was prescribed antibiotics, finished them. States not feeling any better. Alert, oriented, speaking in complete sentences. No resp distress noted. Family at bedside.

## 2017-03-27 NOTE — ED Provider Notes (Signed)
[facility] Emergency Department Provider Note   ____________________________________________    I have reviewed the triage vital signs and the nursing notes.   HISTORY  Chief Complaint Chest Pain     HPI Benjamin Chan is a 72 y.o. male who presents with complaints of chest pain.  Patient has a history of diabetes, coronary artery disease status post CABG greater than 5 years ago.  Patient reports he had a burning in his chest this morning.  He attributed this to a cough that he has had over the last 2 weeks.  However he did take a nitroglycerin with some improvement.  Denies fevers or chills.  No shortness of breath.  No calf pain or swelling.  No recent travel.  No history of PE.  No pleurisy.  No current chest pain   Past Medical History:  Diagnosis Date  . BPH (benign prostatic hyperplasia)   . CAD (coronary artery disease)   . Chronic kidney disease   . COPD (chronic obstructive pulmonary disease) (HCC)   . Diabetes mellitus without complication (HCC)   . Hypertension     Patient Active Problem List   Diagnosis Date Noted  . Renal failure (ARF), acute on chronic (HCC) 07/02/2015  . Bacteremia 07/02/2015  . Sepsis (HCC) 06/29/2015  . UTI (lower urinary tract infection) 06/29/2015    Past Surgical History:  Procedure Laterality Date  . CORONARY ARTERY BYPASS GRAFT    . INGUINAL HERNIA REPAIR     right    Prior to Admission medications   Medication Sig Start Date End Date Taking? Authorizing Provider  acetaminophen (TYLENOL) 325 MG tablet Take 650 mg by mouth every 6 (six) hours as needed.    [provider]  aspirin 81 MG tablet Take 81 mg by mouth daily. 02/19/10   [provider]  cephALEXin (KEFLEX) 500 MG capsule Take 1 capsule (500 mg total) by mouth 3 (three) times daily. 07/02/15   Shaune Pollack, MD  chlorthalidone (HYGROTON) 25 MG tablet Take 25 mg by mouth daily.    [provider]  doxazosin  (CARDURA) 2 MG tablet Take 2 mg by mouth daily.    [provider]  enalapril (VASOTEC) 20 MG tablet Take 1 tablet by mouth daily. 04/30/15   [provider]  finasteride (PROSCAR) 5 MG tablet Take 1 tablet by mouth daily. 03/27/15   [provider]  insulin glargine (LANTUS) 100 UNIT/ML injection Inject 50 Units into the skin at bedtime.    [provider]  metoprolol tartrate (LOPRESSOR) 25 MG tablet Take 1 tablet by mouth daily. 04/04/15   [provider]  nitroGLYCERIN (NITROSTAT) 0.4 MG SL tablet Place 0.4 mg under the tongue. 05/16/14   [provider]  simvastatin (ZOCOR) 40 MG tablet Take 40 mg by mouth daily. 05/16/14   [provider]  tiotropium (SPIRIVA) 18 MCG inhalation capsule Place 18 mcg into inhaler and inhale daily.    [provider]  VENTOLIN HFA 108 (90 Base) MCG/ACT inhaler Inhale 2 puffs into the lungs daily. 04/25/15   [provider]     Allergies Patient has no known allergies.  Family History  Problem Relation Age of Onset  . Hypertension Father     Social History Social History   Tobacco Use  . Smoking status: Former Smoker  Substance Use Topics  . Alcohol use: No  . Drug use: No    Review of Systems  Constitutional: No fever/chills Eyes:  No visual changes.  ENT: No neck pain Cardiovascular: As above Respiratory: Denies shortness of breath. Gastrointestinal: No abdominal pain.  No nausea, no vomiting.   Genitourinary: Negative for dysuria. Musculoskeletal: Negative for back pain. Skin: Negative for rash. Neurological: Negative for headaches   ____________________________________________   PHYSICAL EXAM:  VITAL SIGNS: ED Triage Vitals  Enc Vitals Group     BP 03/27/17 1258 135/65     Pulse Rate 03/27/17 1258 68     Resp 03/27/17 1258 18     Temp 03/27/17 1258 98.4 F (36.9 C)     Temp Source 03/27/17 1258 Oral     SpO2 03/27/17 1258 95 %     Weight 03/27/17  1255 97.5 kg (215 lb)     Height 03/27/17 1255 1.676 m (5\' 6" )     Head Circumference --      Peak Flow --      Pain Score 03/27/17 1255 5     Pain Loc --      Pain Edu? --      Excl. in GC? --     Constitutional: Alert and oriented. No acute distress. Pleasant and interactive Eyes: Conjunctivae are normal.   Nose: No congestion/rhinnorhea. Mouth/Throat: Mucous membranes are moist.    Cardiovascular: Normal rate, regular rhythm. Grossly normal heart sounds.  Good peripheral circulation. Respiratory: Normal respiratory effort.  No retractions. Lungs CTAB. Gastrointestinal: Soft and nontender.  No CVA tenderness. Genitourinary: deferred Musculoskeletal:  Warm and well perfused Neurologic:  Normal speech and language. No gross focal neurologic deficits are appreciated.  Skin:  Skin is warm, dry and intact. No rash noted. Psychiatric: Mood and affect are normal. Speech and behavior are normal.  ____________________________________________   LABS (all labs ordered are listed, but only abnormal results are displayed)  Labs Reviewed  BASIC METABOLIC PANEL - Abnormal; Notable for the following components:      Result Value   Sodium 130 (*)    Chloride 97 (*)    CO2 21 (*)    Glucose, Bld 440 (*)    BUN 29 (*)    Creatinine, Ser 1.76 (*)    GFR calc non Af Amer 37 (*)    GFR calc Af Amer 42 (*)    All other components within normal limits  CBC - Abnormal; Notable for the following components:   WBC 12.5 (*)    All other components within normal limits  TROPONIN I - Abnormal; Notable for the following components:   Troponin I 0.09 (*)    All other components within normal limits  HEMOGLOBIN A1C   ____________________________________________  EKG  ED ECG REPORT I, Jene Every, the attending physician, personally viewed and interpreted this ECG.  Date: 03/27/2017  Rhythm: normal sinus rhythm QRS Axis: normal Intervals: normal ST/T Wave abnormalities:  Nonspecific Narrative Interpretation: no evidence of acute ischemia  ____________________________________________  RADIOLOGY  Chest x-ray unremarkable ____________________________________________   PROCEDURES  Procedure(s) performed: No  Procedures   Critical Care performed: No ____________________________________________   INITIAL IMPRESSION / ASSESSMENT AND PLAN / ED COURSE  Pertinent labs & imaging results that were available during my care of the patient were reviewed by me and considered in my medical decision making (see chart for details).  Patient well-appearing in no acute distress.  However given concerning history of diabetes, CABG, CAD, kidney disease an episode of burning chest pain today relieved by nitroglycerin with elevated troponin I discussed with him admission to the hospital for further evaluation and  management.  Aspirin 325    ____________________________________________   FINAL CLINICAL IMPRESSION(S) / ED DIAGNOSES  Final diagnoses:  Chest pain, unspecified type  Elevated troponin        Note:  This document was prepared using Dragon voice recognition software and may include unintentional dictation errors.    Jene Every, MD 03/27/17 [phone]

## 2017-03-27 NOTE — ED Notes (Signed)
Pt finished a meal from [facility] that family brought him.  Verbal order from Dr. Elisabeth Pigeon to give 10 units novolog insulin for CBG 408/

## 2017-03-27 NOTE — ED Notes (Signed)
Admitting at bedside 

## 2017-03-27 NOTE — ED Notes (Signed)
Pt ambulatory to toilet by self. Denies weakness or dizziness.

## 2017-03-27 NOTE — ED Notes (Signed)
Pt ambulatory to toilet by self. Steady gait noted.  

## 2017-03-28 DIAGNOSIS — I214 Non-ST elevation (NSTEMI) myocardial infarction: Secondary | ICD-10-CM | POA: Diagnosis present

## 2017-03-28 DIAGNOSIS — N400 Enlarged prostate without lower urinary tract symptoms: Secondary | ICD-10-CM | POA: Diagnosis present

## 2017-03-28 DIAGNOSIS — J209 Acute bronchitis, unspecified: Secondary | ICD-10-CM | POA: Diagnosis present

## 2017-03-28 DIAGNOSIS — J440 Chronic obstructive pulmonary disease with acute lower respiratory infection: Secondary | ICD-10-CM | POA: Diagnosis present

## 2017-03-28 DIAGNOSIS — I252 Old myocardial infarction: Secondary | ICD-10-CM | POA: Diagnosis not present

## 2017-03-28 DIAGNOSIS — R079 Chest pain, unspecified: Secondary | ICD-10-CM | POA: Diagnosis present

## 2017-03-28 DIAGNOSIS — Z794 Long term (current) use of insulin: Secondary | ICD-10-CM | POA: Diagnosis not present

## 2017-03-28 DIAGNOSIS — Z951 Presence of aortocoronary bypass graft: Secondary | ICD-10-CM | POA: Diagnosis not present

## 2017-03-28 DIAGNOSIS — Z9114 Patient's other noncompliance with medication regimen: Secondary | ICD-10-CM | POA: Diagnosis not present

## 2017-03-28 DIAGNOSIS — Z955 Presence of coronary angioplasty implant and graft: Secondary | ICD-10-CM | POA: Diagnosis not present

## 2017-03-28 DIAGNOSIS — Z79899 Other long term (current) drug therapy: Secondary | ICD-10-CM | POA: Diagnosis not present

## 2017-03-28 DIAGNOSIS — E1122 Type 2 diabetes mellitus with diabetic chronic kidney disease: Secondary | ICD-10-CM | POA: Diagnosis present

## 2017-03-28 DIAGNOSIS — N183 Chronic kidney disease, stage 3 (moderate): Secondary | ICD-10-CM | POA: Diagnosis present

## 2017-03-28 DIAGNOSIS — Z87891 Personal history of nicotine dependence: Secondary | ICD-10-CM | POA: Diagnosis not present

## 2017-03-28 DIAGNOSIS — Z7982 Long term (current) use of aspirin: Secondary | ICD-10-CM | POA: Diagnosis not present

## 2017-03-28 DIAGNOSIS — I129 Hypertensive chronic kidney disease with stage 1 through stage 4 chronic kidney disease, or unspecified chronic kidney disease: Secondary | ICD-10-CM | POA: Diagnosis present

## 2017-03-28 DIAGNOSIS — Z8249 Family history of ischemic heart disease and other diseases of the circulatory system: Secondary | ICD-10-CM | POA: Diagnosis not present

## 2017-03-28 DIAGNOSIS — I25119 Atherosclerotic heart disease of native coronary artery with unspecified angina pectoris: Secondary | ICD-10-CM | POA: Diagnosis present

## 2017-03-28 DIAGNOSIS — E782 Mixed hyperlipidemia: Secondary | ICD-10-CM | POA: Diagnosis present

## 2017-03-28 LAB — TROPONIN I
Troponin I: 0.99 ng/mL (ref <0.03)
Troponin I: 1.19 ng/mL (ref <0.03)
Troponin I: 1.09 ng/mL (ref <0.03)

## 2017-03-28 LAB — GLUCOSE, CAPILLARY
GLUCOSE-CAPILLARY: 289 mg/dL — AB (ref 65–99)
GLUCOSE-CAPILLARY: 384 mg/dL — AB (ref 65–99)
GLUCOSE-CAPILLARY: 298 mg/dL — AB (ref 65–99)
Glucose-Capillary: 374 mg/dL — ABNORMAL HIGH (ref 65–99)

## 2017-03-28 LAB — CBC
HEMATOCRIT: 41.2 % (ref 40.0–52.0)
HEMOGLOBIN: 13.4 g/dL (ref 13.0–18.0)
MCH: 29.1 pg (ref 26.0–34.0)
MCHC: 32.5 g/dL (ref 32.0–36.0)
MCV: 89.8 fL (ref 80.0–100.0)
Platelets: 274 10*3/uL (ref 150–440)
RBC: 4.59 MIL/uL (ref 4.40–5.90)
RDW: 13.4 % (ref 11.5–14.5)
WBC: 11.1 10*3/uL — AB (ref 3.8–10.6)

## 2017-03-28 LAB — BASIC METABOLIC PANEL
ANION GAP: 7 (ref 5–15)
BUN: 30 mg/dL — ABNORMAL HIGH (ref 6–20)
CO2: 26 mmol/L (ref 22–32)
Calcium: 8.5 mg/dL — ABNORMAL LOW (ref 8.9–10.3)
Chloride: 101 mmol/L (ref 101–111)
Creatinine, Ser: 1.55 mg/dL — ABNORMAL HIGH (ref 0.61–1.24)
GFR, EST AFRICAN AMERICAN: 50 mL/min — AB (ref >60)
GFR, EST NON AFRICAN AMERICAN: 43 mL/min — AB (ref >60)
Glucose, Bld: 282 mg/dL — ABNORMAL HIGH (ref 65–99)
POTASSIUM: 3.8 mmol/L (ref 3.5–5.1)
SODIUM: 134 mmol/L — AB (ref 135–145)

## 2017-03-28 LAB — HEPARIN LEVEL (UNFRACTIONATED)
HEPARIN UNFRACTIONATED: 0.34 [IU]/mL (ref 0.30–0.70)
Heparin Unfractionated: 0.54 IU/mL (ref 0.30–0.70)
Heparin Unfractionated: 0.35 IU/mL (ref 0.30–0.70)

## 2017-03-28 LAB — APTT: APTT: 31 s (ref 24–36)

## 2017-03-28 LAB — PROTIME-INR
INR: 0.97
PROTHROMBIN TIME: 12.8 s (ref 11.4–15.2)

## 2017-03-28 MED ORDER — FLUTICASONE PROPIONATE 50 MCG/ACT NA SUSP
2.0000 | Freq: Every day | NASAL | Status: DC
  Administered 2017-03-28 – 2017-03-31 (×4): 2 via NASAL
  Filled 2017-03-28: qty 16

## 2017-03-28 MED ORDER — AZITHROMYCIN 250 MG PO TABS
500.0000 mg | ORAL_TABLET | Freq: Every day | ORAL | Status: AC
Start: 2017-03-28 — End: 2017-03-28
  Administered 2017-03-28: 500 mg via ORAL
  Filled 2017-03-28: qty 2

## 2017-03-28 MED ORDER — GUAIFENESIN-DM 100-10 MG/5ML PO SYRP: 5.0000 mL | ORAL_SOLUTION | ORAL | Status: DC | PRN

## 2017-03-28 MED ORDER — AZITHROMYCIN 250 MG PO TABS
250.0000 mg | ORAL_TABLET | Freq: Every day | ORAL | Status: DC
  Administered 2017-03-29 – 2017-03-30 (×2): 250 mg via ORAL
  Filled 2017-03-28 (×3): qty 1

## 2017-03-28 MED ORDER — IPRATROPIUM-ALBUTEROL 0.5-2.5 (3) MG/3ML IN SOLN
3.0000 mL | Freq: Four times a day (QID) | RESPIRATORY_TRACT | Status: DC
  Administered 2017-03-28 – 2017-03-29 (×2): 3 mL via RESPIRATORY_TRACT
  Filled 2017-03-28 (×2): qty 3

## 2017-03-28 MED ORDER — HEPARIN BOLUS VIA INFUSION
2000.0000 [IU] | Freq: Once | INTRAVENOUS | Status: AC
  Administered 2017-03-28: 2000 [IU] via INTRAVENOUS
  Filled 2017-03-28: qty 2000

## 2017-03-28 MED ORDER — GUAIFENESIN ER 600 MG PO TB12
600.0000 mg | ORAL_TABLET | Freq: Two times a day (BID) | ORAL | Status: DC
  Administered 2017-03-28 – 2017-03-31 (×6): 600 mg via ORAL
  Filled 2017-03-28 (×6): qty 1

## 2017-03-28 MED ORDER — HEPARIN (PORCINE) IN NACL 100-0.45 UNIT/ML-% IJ SOLN
1050.0000 [IU]/h | INTRAMUSCULAR | Status: DC
  Administered 2017-03-28 (×2): 950 [IU]/h via INTRAVENOUS
  Administered 2017-03-29: 1050 [IU]/h via INTRAVENOUS
  Filled 2017-03-28 (×3): qty 250

## 2017-03-28 NOTE — Progress Notes (Signed)
[employer] - [place] at [facility]                                                                                                                                                                                  Patient Demographics   Mathhew Buysse, is a 72 y.o. male, DOB - [birth year], WUJ:[id number]  Admit date - 03/27/2017   Admitting Physician Altamese Dilling, MD  Outpatient Primary MD for the patient is Steele Sizer, MD   LOS - 0  Subjective: Pt states that he has been having cough and congestion Patient noted to have a troponin that is elevated He was also having chest pain    Review of Systems:   CONSTITUTIONAL: No documented fever. No fatigue, weakness. No weight gain, no weight loss.  EYES: No blurry or double vision.  ENT: No tinnitus. No postnasal drip. No redness of the oropharynx.  RESPIRATORY: Positive cough, no wheeze, no hemoptysis. No dyspnea.  CARDIOVASCULAR: Positive chest pain. No orthopnea. No palpitations. No syncope.  GASTROINTESTINAL: No nausea, no vomiting or diarrhea. No abdominal pain. No melena or hematochezia.  GENITOURINARY: No dysuria or hematuria.  ENDOCRINE: No polyuria or nocturia. No heat or cold intolerance.  HEMATOLOGY: No anemia. No bruising. No bleeding.  INTEGUMENTARY: No rashes. No lesions.  MUSCULOSKELETAL: No arthritis. No swelling. No gout.  NEUROLOGIC: No numbness, tingling, or ataxia. No seizure-type activity.  PSYCHIATRIC: No anxiety. No insomnia. No ADD.    Vitals:   Vitals:   03/28/17 0119 03/28/17 0346 03/28/17 0459 03/28/17 0829  BP:  (!) 110/55  120/64  Pulse:  71  60  Resp:  18    Temp:  98.4 F (36.9 C)  98 F (36.7 C)  TempSrc:  Oral  Oral  SpO2: 97% 96% 95% 97%  Weight:      Height:        Wt Readings from Last 3 Encounters:  03/27/17 204 lb 1.6 oz (92.6 kg)  06/30/15 209 lb 6.4 oz (95 kg)     Intake/Output Summary (Last 24 hours) at 03/28/2017 1641 Last data filed at 03/28/2017  1413 Gross per 24 hour  Intake 1264.23 ml  Output 750 ml  Net 514.23 ml    Physical Exam:   GENERAL: Pleasant-appearing in no apparent distress.  HEAD, EYES, EARS, NOSE AND THROAT: Atraumatic, normocephalic. Extraocular muscles are intact. Pupils equal and reactive to light. Sclerae anicteric. No conjunctival injection. No oro-pharyngeal erythema.  NECK: Supple. There is no jugular venous distention. No bruits, no lymphadenopathy, no thyromegaly.  HEART: Regular rate and rhythm,. No murmurs, no rubs, no clicks.  LUNGS: Clear to auscultation bilaterally. No rales or rhonchi. No wheezes.  ABDOMEN: Soft, flat, nontender, nondistended. Has good bowel sounds. No hepatosplenomegaly appreciated.  EXTREMITIES: No evidence of any cyanosis, clubbing, or peripheral edema.  +2 pedal and radial pulses bilaterally.  NEUROLOGIC: The patient is alert, awake, and oriented x3 with no focal motor or sensory deficits appreciated bilaterally.  SKIN: Moist and warm with no rashes appreciated.  Psych: Not anxious, depressed LN: No inguinal LN enlargement    Antibiotics   Anti-infectives (From admission, onward)   None      Medications   Scheduled Meds: . aspirin EC  81 mg Oral Daily  . doxazosin  2 mg Oral Daily  . enalapril  20 mg Oral Daily  . finasteride  5 mg Oral Daily  . insulin aspart  0-5 Units Subcutaneous QHS  . insulin aspart  0-9 Units Subcutaneous TID WC  . insulin glargine  50 Units Subcutaneous QHS  . ipratropium-albuterol  3 mL Nebulization Q6H  . metoprolol tartrate  25 mg Oral Daily  . simvastatin  40 mg Oral Daily  . tiotropium  18 mcg Inhalation Daily   Continuous Infusions: . heparin 950 Units/hr (03/28/17 0027)   PRN Meds:.acetaminophen, docusate sodium, guaiFENesin, nitroGLYCERIN   Data Review:   Micro Results No results found for this or any previous visit (from the past 240 hour(s)).  Radiology Reports Dg Chest 2 View  Result Date: 03/27/2017 CLINICAL DATA:   Chest pressure, pain with eating, shortness of breath, headache, cough, and congestion for 2 weeks, CHF, COPD, diabetes mellitus EXAM: CHEST  2 VIEW COMPARISON:  06/29/2015 FINDINGS: Normal heart size post CABG. Atherosclerotic calcification aorta. Mediastinal contours and pulmonary vascularity normal. Mild RIGHT apical scarring. Lungs otherwise clear. No infiltrate, pleural effusion or pneumothorax. No acute osseous findings. IMPRESSION: RIGHT apical scarring. No acute abnormalities. Electronically Signed   By: Ulyses Southward M.D.   On: 03/27/2017 13:46     CBC Recent Labs  Lab 03/27/17 1300 03/28/17 0510  WBC 12.5* 11.1*  HGB 14.6 13.4  HCT 43.4 41.2  PLT 296 274  MCV 88.5 89.8  MCH 29.8 29.1  MCHC 33.6 32.5  RDW 13.4 13.4    Chemistries  Recent Labs  Lab 03/27/17 1300 03/27/17 1845 03/28/17 0510  NA 130*  --  134*  K 4.6  --  3.8  CL 97*  --  101  CO2 21*  --  26  GLUCOSE 440*  --  282*  BUN 29*  --  30*  CREATININE 1.76* 1.86* 1.55*  CALCIUM 8.9  --  8.5*   ------------------------------------------------------------------------------------------------------------------ estimated creatinine clearance is 42.7 mL/min (A) (by C-G formula based on SCr of 1.55 mg/dL (H)). ------------------------------------------------------------------------------------------------------------------ Recent Labs    03/27/17 1300  HGBA1C 12.2*   ------------------------------------------------------------------------------------------------------------------ Recent Labs    03/27/17 1304  CHOL 218*  HDL 35*  LDLCALC 123*  TRIG 299*  CHOLHDL 6.2   ------------------------------------------------------------------------------------------------------------------ No results for input(s): TSH, T4TOTAL, T3FREE, THYROIDAB in the last 72 hours.  Invalid input(s): FREET3 ------------------------------------------------------------------------------------------------------------------ No  results for input(s): VITAMINB12, FOLATE, FERRITIN, TIBC, IRON, RETICCTPCT in the last 72 hours.  Coagulation profile Recent Labs  Lab 03/27/17 2354  INR 0.97    No results for input(s): DDIMER in the last 72 hours.  Cardiac Enzymes Recent Labs  Lab 03/27/17 2227 03/28/17 0510 03/28/17 1129  TROPONINI 0.25* 0.99* 1.19*   ------------------------------------------------------------------------------------------------------------------ Invalid input(s): POCBNP    Assessment & Plan   Patient is a 72 year old African-American male presenting with chest pain cough and congestion  1.  Non-ST  MI Heparin drip Continue aspirin Continue metoprolol Continue Zocor Lipid panel in the a.m.  2.  Acute bronchitis I will add oral azithromycin And add antitussive medication  3.  Essential hypertension continue metoprolol  4.  Diabetes type 2 continue sliding scale insulin continue Lantus check a hemoglobin A1c  5.  BPH continue Proscar and Cardura     Code Status Orders  (From admission, onward)        Start     Ordered   03/27/17 1810  Full code  Continuous     03/27/17 1809    Code Status History    Date Active Date Inactive Code Status Order ID Comments User Context   06/29/2015 23:49 07/02/2015 20:00 Full Code [id number]  Ihor Austin, MD ED           Consults cards   DVT Prophylaxis  heparin  Lab Results  Component Value Date   PLT 274 03/28/2017     Time Spent in minutes   Greater than 50% of time spent in care coordination and counseling patient regarding the condition and plan of care.   Auburn Bilberry M.D on 03/28/2017 at 4:41 PM  Between 7am to 6pm - Pager - [phone]  After 6pm go to [email or web address] - password [email or web address] [facility]  [facility]   Office  [phone]

## 2017-03-28 NOTE — Progress Notes (Signed)
ANTICOAGULATION CONSULT NOTE - Initial Consult  Pharmacy Consult for heparin drip Indication: chest pain/ACS  No Known Allergies  Patient Measurements: Height: 5\' 3"  (160 cm) Weight: 204 lb 1.6 oz (92.6 kg) IBW/kg (Calculated) : 56.9 Heparin Dosing Weight: 78 kg  Vital Signs: Temp: 98.2 F (36.8 C) (01/11 2029) Temp Source: Oral (01/11 2029) BP: 108/62 (01/11 2029) Pulse Rate: 74 (01/11 2029)  Labs: Recent Labs    03/27/17 1300 03/27/17 1445 03/27/17 1845 03/27/17 2227  HGB 14.6  --   --   --   HCT 43.4  --   --   --   PLT 296  --   --   --   CREATININE 1.76*  --  1.86*  --   TROPONINI 0.09* 0.10* 0.11* 0.25*    Estimated Creatinine Clearance: 35.6 mL/min (A) (by C-G formula based on SCr of 1.86 mg/dL (H)).   Medical History: Past Medical History:  Diagnosis Date  . BPH (benign prostatic hyperplasia)   . CAD (coronary artery disease)   . Chronic kidney disease   . COPD (chronic obstructive pulmonary disease) (HCC)   . Diabetes mellitus without complication (HCC)   . Hypertension     Medications:    Assessment: No anticoagulation in PTA meds  Goal of Therapy:  Heparin level 0.3-0.7 units/ml Monitor platelets by anticoagulation protocol: Yes   Plan:  Patient received 5000 units heparin SQ so will give half bolus of 2000 units and start at initial rate of 950 units/hr. First heparin level 8 hours after start of infusion.  Kirill Chatterjee S 03/28/2017,12:10 AM

## 2017-03-28 NOTE — Progress Notes (Addendum)
ANTICOAGULATION CONSULT NOTE - Initial Consult  Pharmacy Consult for heparin drip Indication: chest pain/ACS  No Known Allergies  Patient Measurements: Height: 5\' 3"  (160 cm) Weight: 204 lb 1.6 oz (92.6 kg) IBW/kg (Calculated) : 56.9 Heparin Dosing Weight: 78 kg  Vital Signs: Temp: 98 F (36.7 C) (01/12 0829) Temp Source: Oral (01/12 0829) BP: 114/60 (01/12 1643) Pulse Rate: 62 (01/12 1643)  Labs: Recent Labs    03/27/17 1300  03/27/17 1845 03/27/17 2227 03/27/17 2354 03/28/17 0510 03/28/17 0803 03/28/17 1129 03/28/17 1622  HGB 14.6  --   --   --   --  13.4  --   --   --   HCT 43.4  --   --   --   --  41.2  --   --   --   PLT 296  --   --   --   --  274  --   --   --   APTT  --   --   --   --  31  --   --   --   --   LABPROT  --   --   --   --  12.8  --   --   --   --   INR  --   --   --   --  0.97  --   --   --   --   HEPARINUNFRC  --   --   --   --   --   --  0.54  --  0.35  CREATININE 1.76*  --  1.86*  --   --  1.55*  --   --   --   TROPONINI 0.09*   < > 0.11* 0.25*  --  0.99*  --  1.19*  --    < > = values in this interval not displayed.    Estimated Creatinine Clearance: 42.7 mL/min (A) (by C-G formula based on SCr of 1.55 mg/dL (H)).   Medical History: Past Medical History:  Diagnosis Date  . BPH (benign prostatic hyperplasia)   . CAD (coronary artery disease)   . Chronic kidney disease   . COPD (chronic obstructive pulmonary disease) (HCC)   . Diabetes mellitus without complication (HCC)   . Hypertension     Medications:    Assessment: No anticoagulation in PTA meds  Goal of Therapy:  Heparin level 0.3-0.7 units/ml Monitor platelets by anticoagulation protocol: Yes   Plan:  HL = 0.35, therapeutic. Previous heparin level was 0.54 and has now dropped to 0.35. Will obtain follow up heparin level in 6 hours to ensure that drip is therapeutic.   Yolanda Bonine, PharmD Pharmacy Resident 03/28/2017,4:51 PM    01/12 PM heparin level 0.34.  Continue current regimen. Recheck heparin level and CBC with tomorrow AM labs.  Fulton Reek, PharmD, BCPS  03/28/17 11:34 PM

## 2017-03-28 NOTE — Consult Note (Signed)
[facility] Cardiology Consultation Note  Patient ID: DEZMON TORONTO, MRN: [id number], DOB/AGE: [id number] y.o. Admit date: 03/27/2017   Date of Consult: 03/28/2017 Primary Physician: Steele Sizer, MD Primary Cardiologist: [facility]  Chief Complaint:  Chief Complaint  Patient presents with  . Chest Pain   Reason for Consult: Chest pain  HPI: 72 y.o. male with known diabetes essential hypertension mixed hyperlipidemia coronary artery disease status post previous coronary artery bypass graft 6 years prior with previous myocardial infarction who has done fairly well with medication management until the last several weeks when he has had some chest discomfort with and without eating as well as some physical activity relieved by rest and time.  This has been waxing and waning and significantly increased in frequency over the last several weeks.  He was seen in the emergency room for these symptoms with an EKG showing normal sinus rhythm and no evidence of other changes other than preventricular contractions.  Troponin level has elevated to a peak of 0.99 consistent with a non-ST elevation myocardial infarction.  Patient is much more comfortable at this time with no further significant symptoms  Past Medical History:  Diagnosis Date  . BPH (benign prostatic hyperplasia)   . CAD (coronary artery disease)   . Chronic kidney disease   . COPD (chronic obstructive pulmonary disease) (HCC)   . Diabetes mellitus without complication (HCC)   . Hypertension       Surgical History:  Past Surgical History:  Procedure Laterality Date  . CORONARY ARTERY BYPASS GRAFT    . INGUINAL HERNIA REPAIR     right     Home Meds: Prior to Admission medications   Medication Sig Start Date End Date Taking? Authorizing Provider  acetaminophen (TYLENOL) 325 MG tablet Take 650 mg by mouth every 6 (six) hours as needed.   Yes [provider]  aspirin 81 MG tablet Take 81 mg by mouth daily.  02/19/10  Yes [provider]  atorvastatin (LIPITOR) 40 MG tablet Take 40 mg by mouth daily.   Yes [provider]  chlorthalidone (HYGROTON) 25 MG tablet Take 25 mg by mouth daily.   Yes [provider]  doxazosin (CARDURA) 2 MG tablet Take 2 mg by mouth daily.   Yes [provider]  enalapril (VASOTEC) 20 MG tablet Take 1 tablet by mouth daily. 04/30/15  Yes [provider]  finasteride (PROSCAR) 5 MG tablet Take 1 tablet by mouth daily. 03/27/15  Yes [provider]  insulin glargine (LANTUS) 100 UNIT/ML injection Inject 50 Units into the skin at bedtime.   Yes [provider]  metoprolol tartrate (LOPRESSOR) 25 MG tablet Take 1 tablet by mouth daily. 04/04/15  Yes [provider]  nitroGLYCERIN (NITROSTAT) 0.4 MG SL tablet Place 0.4 mg under the tongue. 05/16/14  Yes [provider]  tiotropium (SPIRIVA) 18 MCG inhalation capsule Place 18 mcg into inhaler and inhale daily.   Yes [provider]    Inpatient Medications:  . aspirin EC  81 mg Oral Daily  . doxazosin  2 mg Oral Daily  . enalapril  20 mg Oral Daily  . finasteride  5 mg Oral Daily  . insulin aspart  0-5 Units Subcutaneous QHS  . insulin aspart  0-9 Units Subcutaneous TID WC  . insulin glargine  50 Units Subcutaneous QHS  . ipratropium-albuterol  3 mL Nebulization Q4H  . metoprolol tartrate  25 mg Oral Daily  . simvastatin  40 mg Oral  Daily  . tiotropium  18 mcg Inhalation Daily   . heparin 950 Units/hr (03/28/17 0027)    Allergies: No Known Allergies  Social History   Socioeconomic History  . Marital status: Married    Spouse name: Not on file  . Number of children: Not on file  . Years of education: Not on file  . Highest education level: Not on file  Social Needs  . Financial resource strain: Not on file  . Food insecurity - worry: Not on file  . Food insecurity - inability: Not on file  . Transportation needs - medical:  Not on file  . Transportation needs - non-medical: Not on file  Occupational History  . Occupation: retired  Tobacco Use  . Smoking status: Former Games developer  . Smokeless tobacco: Never Used  Substance and Sexual Activity  . Alcohol use: No  . Drug use: No  . Sexual activity: Not on file  Other Topics Concern  . Not on file  Social History Narrative  . Not on file     Family History  Problem Relation Age of Onset  . Hypertension Father      Review of Systems Positive for chest pain Negative for: General:  chills, fever, night sweats or weight changes.  Cardiovascular: PND orthopnea syncope dizziness  Dermatological skin lesions rashes Respiratory: Cough congestion Urologic: Frequent urination urination at night and hematuria Abdominal: negative for nausea, vomiting, diarrhea, bright red blood per rectum, melena, or hematemesis Neurologic: negative for visual changes, and/or hearing changes  All other systems reviewed and are otherwise negative except as noted above.  Labs: Recent Labs    03/27/17 1445 03/27/17 1845 03/27/17 2227 03/28/17 0510  TROPONINI 0.10* 0.11* 0.25* 0.99*   Lab Results  Component Value Date   WBC 11.1 (H) 03/28/2017   HGB 13.4 03/28/2017   HCT 41.2 03/28/2017   MCV 89.8 03/28/2017   PLT 274 03/28/2017    Recent Labs  Lab 03/28/17 0510  NA 134*  K 3.8  CL 101  CO2 26  BUN 30*  CREATININE 1.55*  CALCIUM 8.5*  GLUCOSE 282*   Lab Results  Component Value Date   CHOL 218 (H) 03/27/2017   HDL 35 (L) 03/27/2017   LDLCALC 123 (H) 03/27/2017   TRIG 299 (H) 03/27/2017   No results found for: DDIMER  Radiology/Studies:  Dg Chest 2 View  Result Date: 03/27/2017 CLINICAL DATA:  Chest pressure, pain with eating, shortness of breath, headache, cough, and congestion for 2 weeks, CHF, COPD, diabetes mellitus EXAM: CHEST  2 VIEW COMPARISON:  06/29/2015 FINDINGS: Normal heart size post CABG. Atherosclerotic calcification aorta. Mediastinal  contours and pulmonary vascularity normal. Mild RIGHT apical scarring. Lungs otherwise clear. No infiltrate, pleural effusion or pneumothorax. No acute osseous findings. IMPRESSION: RIGHT apical scarring. No acute abnormalities. Electronically Signed   By: Ulyses Southward M.D.   On: 03/27/2017 13:46    EKG: Normal sinus rhythm with left axis deviation and preventricular contractions  Weights: Filed Weights   03/27/17 1255 03/27/17 1900  Weight: 97.5 kg (215 lb) 92.6 kg (204 lb 1.6 oz)     Physical Exam: Blood pressure 120/64, pulse 60, temperature 98 F (36.7 C), temperature source Oral, resp. rate 18, height 5\' 3"  (1.6 m), weight 92.6 kg (204 lb 1.6 oz), SpO2 97 %. Body mass index is 36.15 kg/m. General: Well developed, well nourished, in no acute distress. Head eyes ears nose throat: Normocephalic, atraumatic, sclera non-icteric, no xanthomas, nares are without discharge. No  apparent thyromegaly and/or mass  Lungs: Normal respiratory effort.  no wheezes, no rales, no rhonchi.  Heart: RRR with normal S1 S2. no murmur gallop, no rub, PMI is normal size and placement, carotid upstroke normal without bruit, jugular venous pressure is normal Abdomen: Soft, non-tender, non-distended with normoactive bowel sounds. No hepatomegaly. No rebound/guarding. No obvious abdominal masses. Abdominal aorta is normal size without bruit Extremities: No edema. no cyanosis, no clubbing, no ulcers  Peripheral : 2+ bilateral upper extremity pulses, 2+ bilateral femoral pulses, 2+ bilateral dorsal pedal pulse Neuro: Alert and oriented. No facial asymmetry. No focal deficit. Moves all extremities spontaneously. Musculoskeletal: Normal muscle tone without kyphosis Psych:  Responds to questions appropriately with a normal affect.    Assessment: 72 year old male with known coronary artery disease status post coronary bypass graft essential hypertension mixed hyperlipidemia diabetes with a non-ST elevation myocardial  infarction  Plan: 1.  Heparin and aspirin for myocardial infarction 2.  Metoprolol for hypertension control coronary artery disease and myocardial infarction 3.  High intensity cholesterol therapy with simvastatin 4.  Echocardiogram for LV systolic dysfunction and further treatment options 5.  Patient to have a cardiac catheterization to assess coronary anatomy and further treatment thereof is necessary.  Patient understands risk and benefits of cardiac catheterization.  This includes a possibility of death stroke heart attack infection bleeding blood clot.  Patient is at low risk for conscious sedation Signed, Lamar Blinks M.D. [facility] Cardiology 03/28/2017, 9:52 AM

## 2017-03-28 NOTE — Plan of Care (Signed)
Pt is A&Ox4. VSS . RA . Family at bedside. NSR on monitor. Heparin gtt infusing per order. No complaints thus far. Will continue to monitor and report to oncoming RN .  Progressing Education: Knowledge of General Education information will improve 03/28/2017 1651 - Progressing by Jodie Echevaria, RN Health Behavior/Discharge Planning: Ability to manage health-related needs will improve 03/28/2017 1651 - Progressing by Jodie Echevaria, RN Clinical Measurements: Ability to maintain clinical measurements within normal limits will improve 03/28/2017 1651 - Progressing by Jodie Echevaria, RN Will remain free from infection 03/28/2017 1651 - Progressing by Jodie Echevaria, RN Diagnostic test results will improve 03/28/2017 1651 - Progressing by Jodie Echevaria, RN Respiratory complications will improve 03/28/2017 1651 - Progressing by Jodie Echevaria, RN Cardiovascular complication will be avoided 03/28/2017 1651 - Progressing by Jodie Echevaria, RN Activity: Risk for activity intolerance will decrease 03/28/2017 1651 - Progressing by Jodie Echevaria, RN Nutrition: Adequate nutrition will be maintained 03/28/2017 1651 - Progressing by Jodie Echevaria, RN Coping: Level of anxiety will decrease 03/28/2017 1651 - Progressing by Jodie Echevaria, RN Elimination: Will not experience complications related to bowel motility 03/28/2017 1651 - Progressing by Jodie Echevaria, RN Will not experience complications related to urinary retention 03/28/2017 1651 - Progressing by Jodie Echevaria, RN Pain Managment: General experience of comfort will improve 03/28/2017 1651 - Progressing by Jodie Echevaria, RN Safety: Ability to remain free from injury will improve 03/28/2017 1651 - Progressing by Jodie Echevaria, RN Skin Integrity: Risk for impaired skin integrity will decrease 03/28/2017 1651 - Progressing by Jodie Echevaria, RN

## 2017-03-28 NOTE — Progress Notes (Signed)
ANTICOAGULATION CONSULT NOTE - Initial Consult  Pharmacy Consult for heparin drip Indication: chest pain/ACS  No Known Allergies  Patient Measurements: Height: 5\' 3"  (160 cm) Weight: 204 lb 1.6 oz (92.6 kg) IBW/kg (Calculated) : 56.9 Heparin Dosing Weight: 78 kg  Vital Signs: Temp: 98 F (36.7 C) (01/12 0829) Temp Source: Oral (01/12 0829) BP: 120/64 (01/12 0829) Pulse Rate: 60 (01/12 0829)  Labs: Recent Labs    03/27/17 1300  03/27/17 1845 03/27/17 2227 03/27/17 2354 03/28/17 0510 03/28/17 0803  HGB 14.6  --   --   --   --  13.4  --   HCT 43.4  --   --   --   --  41.2  --   PLT 296  --   --   --   --  274  --   APTT  --   --   --   --  31  --   --   LABPROT  --   --   --   --  12.8  --   --   INR  --   --   --   --  0.97  --   --   HEPARINUNFRC  --   --   --   --   --   --  0.54  CREATININE 1.76*  --  1.86*  --   --  1.55*  --   TROPONINI 0.09*   < > 0.11* 0.25*  --  0.99*  --    < > = values in this interval not displayed.    Estimated Creatinine Clearance: 42.7 mL/min (A) (by C-G formula based on SCr of 1.55 mg/dL (H)).   Medical History: Past Medical History:  Diagnosis Date  . BPH (benign prostatic hyperplasia)   . CAD (coronary artery disease)   . Chronic kidney disease   . COPD (chronic obstructive pulmonary disease) (HCC)   . Diabetes mellitus without complication (HCC)   . Hypertension     Medications:    Assessment: No anticoagulation in PTA meds  Goal of Therapy:  Heparin level 0.3-0.7 units/ml Monitor platelets by anticoagulation protocol: Yes   Plan:  Will continue heparin infusion at current rate with level at goal and check a confirmatory level in 8 hours.   Luisa Hart D 03/28/2017,9:46 AM

## 2017-03-29 LAB — CBC
HCT: 40 % (ref 40.0–52.0)
Hemoglobin: 13.2 g/dL (ref 13.0–18.0)
MCH: 29.5 pg (ref 26.0–34.0)
MCHC: 33.1 g/dL (ref 32.0–36.0)
MCV: 89 fL (ref 80.0–100.0)
PLATELETS: 270 10*3/uL (ref 150–440)
RBC: 4.5 MIL/uL (ref 4.40–5.90)
RDW: 13.4 % (ref 11.5–14.5)
WBC: 9.7 10*3/uL (ref 3.8–10.6)

## 2017-03-29 LAB — GLUCOSE, CAPILLARY
GLUCOSE-CAPILLARY: 219 mg/dL — AB (ref 65–99)
GLUCOSE-CAPILLARY: 281 mg/dL — AB (ref 65–99)
Glucose-Capillary: 327 mg/dL — ABNORMAL HIGH (ref 65–99)
Glucose-Capillary: 236 mg/dL — ABNORMAL HIGH (ref 65–99)

## 2017-03-29 LAB — LIPID PANEL
CHOL/HDL RATIO: 4.6 ratio
CHOLESTEROL: 195 mg/dL (ref 0–200)
HDL: 42 mg/dL (ref >40)
LDL Cholesterol: 131 mg/dL — ABNORMAL HIGH (ref 0–99)
Triglycerides: 109 mg/dL (ref <150)
VLDL: 22 mg/dL (ref 0–40)

## 2017-03-29 LAB — HEPARIN LEVEL (UNFRACTIONATED)
HEPARIN UNFRACTIONATED: 0.31 [IU]/mL (ref 0.30–0.70)
Heparin Unfractionated: 0.37 IU/mL (ref 0.30–0.70)

## 2017-03-29 MED ORDER — MORPHINE SULFATE (PF) 2 MG/ML IV SOLN: 2.0000 mg | INTRAVENOUS | Status: DC | PRN

## 2017-03-29 MED ORDER — ISOSORBIDE MONONITRATE ER 30 MG PO TB24
60.0000 mg | ORAL_TABLET | Freq: Every day | ORAL | Status: DC
  Administered 2017-03-29 – 2017-03-31 (×3): 60 mg via ORAL
  Filled 2017-03-29 (×2): qty 2
  Filled 2017-03-29: qty 1

## 2017-03-29 MED ORDER — SODIUM CHLORIDE 0.9 % IV SOLN: 250.0000 mL | INTRAVENOUS | Status: DC | PRN

## 2017-03-29 MED ORDER — GUAIFENESIN-CODEINE 100-10 MG/5ML PO SOLN
15.0000 mL | Freq: Four times a day (QID) | ORAL | Status: DC | PRN
  Administered 2017-03-29: 15 mL via ORAL
  Filled 2017-03-29: qty 15

## 2017-03-29 MED ORDER — IPRATROPIUM-ALBUTEROL 0.5-2.5 (3) MG/3ML IN SOLN
3.0000 mL | Freq: Two times a day (BID) | RESPIRATORY_TRACT | Status: DC
  Administered 2017-03-29 – 2017-03-31 (×3): 3 mL via RESPIRATORY_TRACT
  Filled 2017-03-29 (×3): qty 3

## 2017-03-29 MED ORDER — SODIUM CHLORIDE 0.9% FLUSH
3.0000 mL | Freq: Two times a day (BID) | INTRAVENOUS | Status: DC
  Administered 2017-03-29 – 2017-03-31 (×4): 3 mL via INTRAVENOUS

## 2017-03-29 MED ORDER — SIMETHICONE 80 MG PO CHEW
80.0000 mg | CHEWABLE_TABLET | Freq: Four times a day (QID) | ORAL | Status: DC | PRN
  Administered 2017-03-29: 80 mg via ORAL
  Filled 2017-03-29 (×2): qty 1

## 2017-03-29 MED ORDER — ASPIRIN 81 MG PO CHEW
81.0000 mg | CHEWABLE_TABLET | ORAL | Status: AC
  Administered 2017-03-30: 81 mg via ORAL
  Filled 2017-03-29: qty 1

## 2017-03-29 NOTE — Progress Notes (Signed)
[facility] Cardiology [facility] Encounter Note  Patient: Benjamin Chan / Admit Date: 03/27/2017 / Date of Encounter: 03/29/2017, 7:46 AM   Subjective: Patient woke up last night with an episode of substernal chest discomfort radiating into his arm and had shortness of breath.  This was relieved by nitroglycerin and oxygen.  The patient now has improved symptoms and has no longer had any chest pain this morning.  He remains on heparin and nitrates  Review of Systems: Positive for: Chest pain shortness of breath Negative for: Vision change, hearing change, syncope, dizziness, nausea, vomiting,diarrhea, bloody stool, stomach pain, cough, congestion, diaphoresis, urinary frequency, urinary pain,skin lesions, skin rashes Others previously listed  Objective: Telemetry: Normal sinus rhythm Physical Exam: Blood pressure (!) 126/94, pulse 70, temperature 98.1 F (36.7 C), temperature source Oral, resp. rate 18, height 5\' 3"  (1.6 m), weight 92.6 kg (204 lb 1.6 oz), SpO2 94 %. Body mass index is 36.15 kg/m. General: Well developed, well nourished, in no acute distress. Head: Normocephalic, atraumatic, sclera non-icteric, no xanthomas, nares are without discharge. Neck: No apparent masses Lungs: Normal respirations with no wheezes, no rhonchi, no rales , no crackles   Heart: Regular rate and rhythm, normal S1 S2, no murmur, no rub, no gallop, PMI is normal size and placement, carotid upstroke normal without bruit, jugular venous pressure normal Abdomen: Soft, non-tender, non-distended with normoactive bowel sounds. No hepatosplenomegaly. Abdominal aorta is normal size without bruit Extremities: No edema, no clubbing, no cyanosis, no ulcers,  Peripheral: 2+ radial, 2+ femoral, 2+ dorsal pedal pulses Neuro: Alert and oriented. Moves all extremities spontaneously. Psych:  Responds to questions appropriately with a normal affect.   Intake/Output Summary (Last 24 hours) at 03/29/2017 0746 Last  data filed at 03/29/2017 0622 Gross per 24 hour  Intake 589.25 ml  Output 1025 ml  Net -435.75 ml    Inpatient Medications:  . aspirin EC  81 mg Oral Daily  . azithromycin  250 mg Oral Daily  . doxazosin  2 mg Oral Daily  . enalapril  20 mg Oral Daily  . finasteride  5 mg Oral Daily  . fluticasone  2 spray Each Nare Daily  . guaiFENesin  600 mg Oral BID  . insulin aspart  0-5 Units Subcutaneous QHS  . insulin aspart  0-9 Units Subcutaneous TID WC  . insulin glargine  50 Units Subcutaneous QHS  . ipratropium-albuterol  3 mL Nebulization Q6H  . metoprolol tartrate  25 mg Oral Daily  . simvastatin  40 mg Oral Daily  . sodium chloride flush  3 mL Intravenous Q12H  . tiotropium  18 mcg Inhalation Daily   Infusions:  . heparin 950 Units/hr (03/28/17 2137)    Labs: Recent Labs    03/27/17 1300 03/27/17 1845 03/28/17 0510  NA 130*  --  134*  K 4.6  --  3.8  CL 97*  --  101  CO2 21*  --  26  GLUCOSE 440*  --  282*  BUN 29*  --  30*  CREATININE 1.76* 1.86* 1.55*  CALCIUM 8.9  --  8.5*   No results for input(s): AST, ALT, ALKPHOS, BILITOT, PROT, ALBUMIN in the last 72 hours. Recent Labs    03/28/17 0510 03/29/17 0615  WBC 11.1* 9.7  HGB 13.4 13.2  HCT 41.2 40.0  MCV 89.8 89.0  PLT 274 270   Recent Labs    03/27/17 2227 03/28/17 0510 03/28/17 1129 03/28/17 1622  TROPONINI 0.25* 0.99* 1.19* 1.09*   Invalid  input(s): POCBNP Recent Labs    03/27/17 1300  HGBA1C 12.2*     Weights: Filed Weights   03/27/17 1255 03/27/17 1900  Weight: 97.5 kg (215 lb) 92.6 kg (204 lb 1.6 oz)     Radiology/Studies:  Dg Chest 2 View  Result Date: 03/27/2017 CLINICAL DATA:  Chest pressure, pain with eating, shortness of breath, headache, cough, and congestion for 2 weeks, CHF, COPD, diabetes mellitus EXAM: CHEST  2 VIEW COMPARISON:  06/29/2015 FINDINGS: Normal heart size post CABG. Atherosclerotic calcification aorta. Mediastinal contours and pulmonary vascularity normal. Mild  RIGHT apical scarring. Lungs otherwise clear. No infiltrate, pleural effusion or pneumothorax. No acute osseous findings. IMPRESSION: RIGHT apical scarring. No acute abnormalities. Electronically Signed   By: Ulyses Southward M.D.   On: 03/27/2017 13:46     Assessment and Recommendation  72 y.o. male with known coronary artery disease status post coronary bypass graft chronic kidney disease diabetes hypertension having non-ST elevation myocardial infarction without evidence of myocardial congestive heart failure.   1.  Continue supportive care for chest pain including nitrate 2.  Hypertensive medication management as before without change 3.  High intensity cholesterol therapy 4.  Heparin for further risk reduction of myocardial infarction 5.  Proceed to cardiac catheterization to assess coronary anatomy and further treatment thereof is necessary.  Patient understands the risk and benefits of cardiac catheterization.  This includes a possibility of death stroke heart attack infection bleeding or blood clot.  Patient is at low risk for conscious sedation  Signed, Arnoldo Hooker M.D. FACC

## 2017-03-29 NOTE — Progress Notes (Signed)
Pt. Slept well with c/o pain x1, headache, medicated with effective results. He also c/o chest pain x1, 1 nitro tablet given, with effective results,  pt. Then described the pain a gas passing through going down into his intestines. He also stated he has been very gassy since being in the hospital.Pt. Ran SB/SR with HR in 50's and 60's.

## 2017-03-29 NOTE — Progress Notes (Signed)
Pt. C/o gas pains, new order for simethicone.

## 2017-03-29 NOTE — Progress Notes (Signed)
ANTICOAGULATION CONSULT NOTE - Initial Consult  Pharmacy Consult for heparin drip Indication: chest pain/ACS  No Known Allergies  Patient Measurements: Height: 5\' 3"  (160 cm) Weight: 204 lb 1.6 oz (92.6 kg) IBW/kg (Calculated) : 56.9 Heparin Dosing Weight: 78 kg  Vital Signs: Temp: 97.7 F (36.5 C) (01/13 0748) Temp Source: Oral (01/13 0748) BP: 90/51 (01/13 1633) Pulse Rate: 62 (01/13 1633)  Labs: Recent Labs    03/27/17 1300  03/27/17 1845  03/27/17 2354 03/28/17 0510  03/28/17 1129 03/28/17 1622 03/28/17 2253 03/29/17 0615 03/29/17 1614  HGB 14.6  --   --   --   --  13.4  --   --   --   --  13.2  --   HCT 43.4  --   --   --   --  41.2  --   --   --   --  40.0  --   PLT 296  --   --   --   --  274  --   --   --   --  270  --   APTT  --   --   --   --  31  --   --   --   --   --   --   --   LABPROT  --   --   --   --  12.8  --   --   --   --   --   --   --   INR  --   --   --   --  0.97  --   --   --   --   --   --   --   HEPARINUNFRC  --   --   --   --   --   --    < >  --  0.35 0.34 0.31 0.37  CREATININE 1.76*  --  1.86*  --   --  1.55*  --   --   --   --   --   --   TROPONINI 0.09*   < > 0.11*   < >  --  0.99*  --  1.19* 1.09*  --   --   --    < > = values in this interval not displayed.    Estimated Creatinine Clearance: 42.7 mL/min (A) (by C-G formula based on SCr of 1.55 mg/dL (H)).   Medical History: Past Medical History:  Diagnosis Date  . BPH (benign prostatic hyperplasia)   . CAD (coronary artery disease)   . Chronic kidney disease   . COPD (chronic obstructive pulmonary disease) (HCC)   . Diabetes mellitus without complication (HCC)   . Hypertension     Medications:    Assessment: No anticoagulation in PTA meds  Goal of Therapy:  Heparin level 0.3-0.7 units/ml Monitor platelets by anticoagulation protocol: Yes   Plan:  HL = 0.35, therapeutic. Previous heparin level was 0.54 and has now dropped to 0.35. Will obtain follow up heparin  level in 6 hours to ensure that drip is therapeutic.   Yolanda Bonine, PharmD Pharmacy Resident 03/29/2017,5:17 PM   01/12 PM heparin level 0.34. Continue current regimen. Recheck heparin level and CBC with tomorrow AM labs.  1/13 AM: Heparin level at low end of goal range, 0.31. Will increase heparin infusion rate to 1050 units/hr and recheck a HL 8 hours after rate change.   1/13 PM: HL 0.37. Will  continue current rate of 1050units/hr and recheck HL with AM labs. CBC ordered with AM labs per protocol.   Gardner Candle, PharmD, BCPS Clinical Pharmacist 03/29/2017 5:18 PM

## 2017-03-29 NOTE — Progress Notes (Signed)
[employer] - [place] at [facility]                                                                                                                                                                                  Patient Demographics   Benjamin Chan, is a 72 y.o. male, DOB - [birth year], WUJ:[id number]  Admit date - 03/27/2017   Admitting Physician Altamese Dilling, MD  Outpatient Primary MD for the patient is Steele Sizer, MD   LOS - 1  Subjective: Continues to have cough and congestion Chest pain again last night had to receive nitroglycerin   Review of Systems:   CONSTITUTIONAL: No documented fever. No fatigue, weakness. No weight gain, no weight loss.  EYES: No blurry or double vision.  ENT: No tinnitus. No postnasal drip. No redness of the oropharynx.  RESPIRATORY: Positive cough, no wheeze, no hemoptysis. No dyspnea.  CARDIOVASCULAR: Positive chest pain. No orthopnea. No palpitations. No syncope.  GASTROINTESTINAL: No nausea, no vomiting or diarrhea. No abdominal pain. No melena or hematochezia.  GENITOURINARY: No dysuria or hematuria.  ENDOCRINE: No polyuria or nocturia. No heat or cold intolerance.  HEMATOLOGY: No anemia. No bruising. No bleeding.  INTEGUMENTARY: No rashes. No lesions.  MUSCULOSKELETAL: No arthritis. No swelling. No gout.  NEUROLOGIC: No numbness, tingling, or ataxia. No seizure-type activity.  PSYCHIATRIC: No anxiety. No insomnia. No ADD.    Vitals:   Vitals:   03/28/17 2034 03/29/17 0103 03/29/17 0444 03/29/17 0748  BP: (!) 114/59 101/60 (!) 126/94 114/65  Pulse: 66 79 70 68  Resp: 18 20 18 14   Temp: 98.5 F (36.9 C)  98.1 F (36.7 C) 97.7 F (36.5 C)  TempSrc: Oral  Oral Oral  SpO2: 93% 93% 94% 95%  Weight:      Height:        Wt Readings from Last 3 Encounters:  03/27/17 204 lb 1.6 oz (92.6 kg)  06/30/15 209 lb 6.4 oz (95 kg)     Intake/Output Summary (Last 24 hours) at 03/29/2017 1406 Last data filed at  03/29/2017 1007 Gross per 24 hour  Intake 709.25 ml  Output 1050 ml  Net -340.75 ml    Physical Exam:   GENERAL: Pleasant-appearing in no apparent distress.  HEAD, EYES, EARS, NOSE AND THROAT: Atraumatic, normocephalic. Extraocular muscles are intact. Pupils equal and reactive to light. Sclerae anicteric. No conjunctival injection. No oro-pharyngeal erythema.  NECK: Supple. There is no jugular venous distention. No bruits, no lymphadenopathy, no thyromegaly.  HEART: Regular rate and rhythm,. No murmurs, no rubs, no clicks.  LUNGS: Clear to auscultation bilaterally. No rales or rhonchi. No wheezes.  ABDOMEN: Soft, flat, nontender, nondistended. Has  good bowel sounds. No hepatosplenomegaly appreciated.  EXTREMITIES: No evidence of any cyanosis, clubbing, or peripheral edema.  +2 pedal and radial pulses bilaterally.  NEUROLOGIC: The patient is alert, awake, and oriented x3 with no focal motor or sensory deficits appreciated bilaterally.  SKIN: Moist and warm with no rashes appreciated.  Psych: Not anxious, depressed LN: No inguinal LN enlargement    Antibiotics   Anti-infectives (From admission, onward)   Start     Dose/Rate Route Frequency Ordered Stop   03/29/17 1600  azithromycin (ZITHROMAX) tablet 250 mg     250 mg Oral Daily 03/28/17 1647 04/02/17 1559   03/28/17 1700  azithromycin (ZITHROMAX) tablet 500 mg     500 mg Oral Daily 03/28/17 1647 03/28/17 1715      Medications   Scheduled Meds: . aspirin EC  81 mg Oral Daily  . azithromycin  250 mg Oral Daily  . doxazosin  2 mg Oral Daily  . enalapril  20 mg Oral Daily  . finasteride  5 mg Oral Daily  . fluticasone  2 spray Each Nare Daily  . guaiFENesin  600 mg Oral BID  . insulin aspart  0-5 Units Subcutaneous QHS  . insulin aspart  0-9 Units Subcutaneous TID WC  . insulin glargine  50 Units Subcutaneous QHS  . ipratropium-albuterol  3 mL Nebulization BID  . isosorbide mononitrate  60 mg Oral Daily  . metoprolol  tartrate  25 mg Oral Daily  . simvastatin  40 mg Oral Daily  . sodium chloride flush  3 mL Intravenous Q12H  . tiotropium  18 mcg Inhalation Daily   Continuous Infusions: . heparin 1,050 Units/hr (03/29/17 0827)   PRN Meds:.acetaminophen, docusate sodium, guaiFENesin-codeine, morphine injection, nitroGLYCERIN, simethicone   Data Review:   Micro Results No results found for this or any previous visit (from the past 240 hour(s)).  Radiology Reports Dg Chest 2 View  Result Date: 03/27/2017 CLINICAL DATA:  Chest pressure, pain with eating, shortness of breath, headache, cough, and congestion for 2 weeks, CHF, COPD, diabetes mellitus EXAM: CHEST  2 VIEW COMPARISON:  06/29/2015 FINDINGS: Normal heart size post CABG. Atherosclerotic calcification aorta. Mediastinal contours and pulmonary vascularity normal. Mild RIGHT apical scarring. Lungs otherwise clear. No infiltrate, pleural effusion or pneumothorax. No acute osseous findings. IMPRESSION: RIGHT apical scarring. No acute abnormalities. Electronically Signed   By: Ulyses Southward M.D.   On: 03/27/2017 13:46     CBC Recent Labs  Lab 03/27/17 1300 03/28/17 0510 03/29/17 0615  WBC 12.5* 11.1* 9.7  HGB 14.6 13.4 13.2  HCT 43.4 41.2 40.0  PLT 296 274 270  MCV 88.5 89.8 89.0  MCH 29.8 29.1 29.5  MCHC 33.6 32.5 33.1  RDW 13.4 13.4 13.4    Chemistries  Recent Labs  Lab 03/27/17 1300 03/27/17 1845 03/28/17 0510  NA 130*  --  134*  K 4.6  --  3.8  CL 97*  --  101  CO2 21*  --  26  GLUCOSE 440*  --  282*  BUN 29*  --  30*  CREATININE 1.76* 1.86* 1.55*  CALCIUM 8.9  --  8.5*   ------------------------------------------------------------------------------------------------------------------ estimated creatinine clearance is 42.7 mL/min (A) (by C-G formula based on SCr of 1.55 mg/dL (H)). ------------------------------------------------------------------------------------------------------------------ Recent Labs    03/27/17 1300   HGBA1C 12.2*   ------------------------------------------------------------------------------------------------------------------ Recent Labs    03/27/17 1304 03/29/17 0615  CHOL 218* 195  HDL 35* 42  LDLCALC 123* 131*  TRIG 299* 109  CHOLHDL 6.2  4.6   ------------------------------------------------------------------------------------------------------------------ No results for input(s): TSH, T4TOTAL, T3FREE, THYROIDAB in the last 72 hours.  Invalid input(s): FREET3 ------------------------------------------------------------------------------------------------------------------ No results for input(s): VITAMINB12, FOLATE, FERRITIN, TIBC, IRON, RETICCTPCT in the last 72 hours.  Coagulation profile Recent Labs  Lab 03/27/17 2354  INR 0.97    No results for input(s): DDIMER in the last 72 hours.  Cardiac Enzymes Recent Labs  Lab 03/28/17 0510 03/28/17 1129 03/28/17 1622  TROPONINI 0.99* 1.19* 1.09*   ------------------------------------------------------------------------------------------------------------------ Invalid input(s): POCBNP    Assessment & Plan   Patient is a 72 year old African-American male presenting with chest pain cough and congestion  1.  Non-ST MI Heparin drip Continue aspirin Continue metoprolol Continue Zocor Place patient on nitro patch Use as needed morphine for pain Cardiac catheterization tomorrow  2.  Acute bronchitis Continue oral azithromycin Continue antitussive medication  3.  Essential hypertension continue metoprolol  4.  Diabetes type 2 continue sliding scale insulin continue Lantus  5.  BPH continue Proscar and Cardura     Code Status Orders  (From admission, onward)        Start     Ordered   03/27/17 1810  Full code  Continuous     03/27/17 1809    Code Status History    Date Active Date Inactive Code Status Order ID Comments User Context   06/29/2015 23:49 07/02/2015 20:00 Full Code [id number]   Ihor Austin, MD ED           Consults cards   DVT Prophylaxis  heparin  Lab Results  Component Value Date   PLT 270 03/29/2017     Time Spent in minutes   Greater than 50% of time spent in care coordination and counseling patient regarding the condition and plan of care.   Auburn Bilberry M.D on 03/29/2017 at 2:06 PM  Between 7am to 6pm - Pager - [phone]  After 6pm go to [email or web address] - password [email or web address] [facility]  [facility] [place] Hospitalists   Office  [phone]

## 2017-03-29 NOTE — Progress Notes (Signed)
ANTICOAGULATION CONSULT NOTE - Initial Consult  Pharmacy Consult for heparin drip Indication: chest pain/ACS  No Known Allergies  Patient Measurements: Height: 5\' 3"  (160 cm) Weight: 204 lb 1.6 oz (92.6 kg) IBW/kg (Calculated) : 56.9 Heparin Dosing Weight: 78 kg  Vital Signs: Temp: 98.1 F (36.7 C) (01/13 0444) Temp Source: Oral (01/13 0444) BP: 126/94 (01/13 0444) Pulse Rate: 70 (01/13 0444)  Labs: Recent Labs    03/27/17 1300  03/27/17 1845  03/27/17 2354 03/28/17 0510  03/28/17 1129 03/28/17 1622 03/28/17 2253 03/29/17 0615  HGB 14.6  --   --   --   --  13.4  --   --   --   --  13.2  HCT 43.4  --   --   --   --  41.2  --   --   --   --  40.0  PLT 296  --   --   --   --  274  --   --   --   --  270  APTT  --   --   --   --  31  --   --   --   --   --   --   LABPROT  --   --   --   --  12.8  --   --   --   --   --   --   INR  --   --   --   --  0.97  --   --   --   --   --   --   HEPARINUNFRC  --   --   --   --   --   --    < >  --  0.35 0.34 0.31  CREATININE 1.76*  --  1.86*  --   --  1.55*  --   --   --   --   --   TROPONINI 0.09*   < > 0.11*   < >  --  0.99*  --  1.19* 1.09*  --   --    < > = values in this interval not displayed.    Estimated Creatinine Clearance: 42.7 mL/min (A) (by C-G formula based on SCr of 1.55 mg/dL (H)).   Medical History: Past Medical History:  Diagnosis Date  . BPH (benign prostatic hyperplasia)   . CAD (coronary artery disease)   . Chronic kidney disease   . COPD (chronic obstructive pulmonary disease) (HCC)   . Diabetes mellitus without complication (HCC)   . Hypertension     Medications:    Assessment: No anticoagulation in PTA meds  Goal of Therapy:  Heparin level 0.3-0.7 units/ml Monitor platelets by anticoagulation protocol: Yes   Plan:  HL = 0.35, therapeutic. Previous heparin level was 0.54 and has now dropped to 0.35. Will obtain follow up heparin level in 6 hours to ensure that drip is therapeutic.   Yolanda Bonine, PharmD Pharmacy Resident 03/29/2017,7:26 AM    01/12 PM heparin level 0.34. Continue current regimen. Recheck heparin level and CBC with tomorrow AM labs.  1/13 AM: Heparin level at low end of goal range, 0.31. Will increase heparin infusion rate to 1050 units/hr and recheck a HL 8 hours after rate change.   Luisa Hart, PharmD Clinical Pharmacist  1/13 AM  03/29/17 7:26 AM

## 2017-03-30 ENCOUNTER — Encounter: Payer: Self-pay | Admitting: *Deleted

## 2017-03-30 ENCOUNTER — Encounter
Admission: EM | Disposition: A | Discharge status: Home / Self Care | Payer: Self-pay | Source: Home / Self Care | Attending: Internal Medicine

## 2017-03-30 HISTORY — PX: CORONARY STENT INTERVENTION: CATH118234

## 2017-03-30 HISTORY — PX: LEFT HEART CATH AND CORS/GRAFTS ANGIOGRAPHY: CATH118250

## 2017-03-30 LAB — BASIC METABOLIC PANEL
ANION GAP: 10 (ref 5–15)
BUN: 33 mg/dL — ABNORMAL HIGH (ref 6–20)
CO2: 24 mmol/L (ref 22–32)
Calcium: 8.4 mg/dL — ABNORMAL LOW (ref 8.9–10.3)
Chloride: 100 mmol/L — ABNORMAL LOW (ref 101–111)
Creatinine, Ser: 1.7 mg/dL — ABNORMAL HIGH (ref 0.61–1.24)
GFR, EST AFRICAN AMERICAN: 44 mL/min — AB (ref >60)
GFR, EST NON AFRICAN AMERICAN: 38 mL/min — AB (ref >60)
GLUCOSE: 245 mg/dL — AB (ref 65–99)
Potassium: 4.2 mmol/L (ref 3.5–5.1)
Sodium: 134 mmol/L — ABNORMAL LOW (ref 135–145)

## 2017-03-30 LAB — HEPARIN LEVEL (UNFRACTIONATED): Heparin Unfractionated: 0.31 IU/mL (ref 0.30–0.70)

## 2017-03-30 LAB — CBC
HCT: 39 % — ABNORMAL LOW (ref 40.0–52.0)
Hemoglobin: 12.8 g/dL — ABNORMAL LOW (ref 13.0–18.0)
MCH: 29.6 pg (ref 26.0–34.0)
MCHC: 32.9 g/dL (ref 32.0–36.0)
MCV: 89.9 fL (ref 80.0–100.0)
PLATELETS: 283 10*3/uL (ref 150–440)
RBC: 4.34 MIL/uL — ABNORMAL LOW (ref 4.40–5.90)
RDW: 13.7 % (ref 11.5–14.5)
WBC: 9.8 10*3/uL (ref 3.8–10.6)

## 2017-03-30 LAB — GLUCOSE, CAPILLARY
GLUCOSE-CAPILLARY: 252 mg/dL — AB (ref 65–99)
GLUCOSE-CAPILLARY: 254 mg/dL — AB (ref 65–99)
GLUCOSE-CAPILLARY: 374 mg/dL — AB (ref 65–99)
GLUCOSE-CAPILLARY: 219 mg/dL — AB (ref 65–99)
Glucose-Capillary: 213 mg/dL — ABNORMAL HIGH (ref 65–99)

## 2017-03-30 LAB — MRSA PCR SCREENING: MRSA by PCR: NEGATIVE

## 2017-03-30 LAB — POCT ACTIVATED CLOTTING TIME: ACTIVATED CLOTTING TIME: 313 s

## 2017-03-30 SURGERY — LEFT HEART CATH AND CORS/GRAFTS ANGIOGRAPHY
Anesthesia: Moderate Sedation

## 2017-03-30 MED ORDER — HYDRALAZINE HCL 20 MG/ML IJ SOLN: 5.0000 mg | INTRAMUSCULAR | Status: AC | PRN

## 2017-03-30 MED ORDER — INSULIN GLARGINE 100 UNIT/ML ~~LOC~~ SOLN
55.0000 [IU] | Freq: Every day | SUBCUTANEOUS | Status: DC
  Administered 2017-03-30: 55 [IU] via SUBCUTANEOUS
  Filled 2017-03-30 (×2): qty 0.55

## 2017-03-30 MED ORDER — SODIUM CHLORIDE 0.9% FLUSH
3.0000 mL | Freq: Two times a day (BID) | INTRAVENOUS | Status: DC
  Administered 2017-03-31: 3 mL via INTRAVENOUS

## 2017-03-30 MED ORDER — ASPIRIN 81 MG PO CHEW
CHEWABLE_TABLET | ORAL | Status: AC
  Filled 2017-03-30: qty 3

## 2017-03-30 MED ORDER — MIDAZOLAM HCL 2 MG/2ML IJ SOLN
INTRAMUSCULAR | Status: DC | PRN
  Administered 2017-03-30: 1 mg via INTRAVENOUS

## 2017-03-30 MED ORDER — SODIUM CHLORIDE 0.9 % IV SOLN
INTRAVENOUS | Status: AC | PRN
  Administered 2017-03-30: 1.75 mg/kg/h via INTRAVENOUS

## 2017-03-30 MED ORDER — MIDAZOLAM HCL 2 MG/2ML IJ SOLN
INTRAMUSCULAR | Status: AC
  Filled 2017-03-30: qty 2

## 2017-03-30 MED ORDER — SODIUM CHLORIDE 0.9 % WEIGHT BASED INFUSION: 1.0000 mL/kg/h | INTRAVENOUS | Status: DC

## 2017-03-30 MED ORDER — TICAGRELOR 90 MG PO TABS
ORAL_TABLET | ORAL | Status: AC
  Filled 2017-03-30: qty 2

## 2017-03-30 MED ORDER — SODIUM CHLORIDE 0.9% FLUSH
3.0000 mL | Freq: Two times a day (BID) | INTRAVENOUS | Status: DC
  Administered 2017-03-30: 3 mL via INTRAVENOUS

## 2017-03-30 MED ORDER — LABETALOL HCL 5 MG/ML IV SOLN: 10.0000 mg | INTRAVENOUS | Status: AC | PRN

## 2017-03-30 MED ORDER — SODIUM CHLORIDE 0.9 % WEIGHT BASED INFUSION
1.0000 mL/kg/h | INTRAVENOUS | Status: AC
  Administered 2017-03-30 (×2): 1 mL/kg/h via INTRAVENOUS

## 2017-03-30 MED ORDER — ASPIRIN 81 MG PO CHEW
CHEWABLE_TABLET | ORAL | Status: DC | PRN
  Administered 2017-03-30: 22380.3 mg via ORAL

## 2017-03-30 MED ORDER — BIVALIRUDIN TRIFLUOROACETATE 250 MG IV SOLR
INTRAVENOUS | Status: AC
  Filled 2017-03-30: qty 250

## 2017-03-30 MED ORDER — TICAGRELOR 90 MG PO TABS
ORAL_TABLET | ORAL | Status: DC | PRN
  Administered 2017-03-30: 180 mg via ORAL

## 2017-03-30 MED ORDER — SODIUM CHLORIDE 0.9% FLUSH: 3.0000 mL | INTRAVENOUS | Status: DC | PRN

## 2017-03-30 MED ORDER — FENTANYL CITRATE (PF) 100 MCG/2ML IJ SOLN
INTRAMUSCULAR | Status: AC
  Filled 2017-03-30: qty 2

## 2017-03-30 MED ORDER — IOPAMIDOL (ISOVUE-300) INJECTION 61%
INTRAVENOUS | Status: DC | PRN
  Administered 2017-03-30: 125 mL via INTRA_ARTERIAL

## 2017-03-30 MED ORDER — TICAGRELOR 90 MG PO TABS
90.0000 mg | ORAL_TABLET | Freq: Two times a day (BID) | ORAL | Status: DC
  Administered 2017-03-30 – 2017-03-31 (×2): 90 mg via ORAL
  Filled 2017-03-30 (×3): qty 1

## 2017-03-30 MED ORDER — ACETAMINOPHEN 325 MG PO TABS: 650.0000 mg | ORAL_TABLET | ORAL | Status: DC | PRN

## 2017-03-30 MED ORDER — NITROGLYCERIN 5 MG/ML IV SOLN
INTRAVENOUS | Status: AC
  Filled 2017-03-30: qty 10

## 2017-03-30 MED ORDER — SODIUM CHLORIDE 0.9 % WEIGHT BASED INFUSION
3.0000 mL/kg/h | INTRAVENOUS | Status: DC
  Administered 2017-03-30: 3 mL/kg/h via INTRAVENOUS

## 2017-03-30 MED ORDER — BIVALIRUDIN BOLUS VIA INFUSION - CUPID
INTRAVENOUS | Status: DC | PRN
  Administered 2017-03-30: 69.075 mg via INTRAVENOUS

## 2017-03-30 MED ORDER — IOPAMIDOL (ISOVUE-300) INJECTION 61%
INTRAVENOUS | Status: DC | PRN
  Administered 2017-03-30: 180 mL via INTRAVENOUS

## 2017-03-30 MED ORDER — ASPIRIN 81 MG PO CHEW
81.0000 mg | CHEWABLE_TABLET | Freq: Every day | ORAL | Status: DC
  Administered 2017-03-31: 81 mg via ORAL
  Filled 2017-03-30: qty 1

## 2017-03-30 MED ORDER — SODIUM CHLORIDE 0.9 % IV SOLN: 250.0000 mL | INTRAVENOUS | Status: DC | PRN

## 2017-03-30 MED ORDER — TICAGRELOR 90 MG PO TABS
ORAL_TABLET | ORAL | Status: AC
  Filled 2017-03-30: qty 1

## 2017-03-30 MED ORDER — BIVALIRUDIN TRIFLUOROACETATE 250 MG IV SOLR: 0.2500 mg/kg/h | INTRAVENOUS | Status: DC

## 2017-03-30 MED ORDER — ONDANSETRON HCL 4 MG/2ML IJ SOLN: 4.0000 mg | Freq: Four times a day (QID) | INTRAMUSCULAR | Status: DC | PRN

## 2017-03-30 MED ORDER — HEPARIN (PORCINE) IN NACL 2-0.9 UNIT/ML-% IJ SOLN
INTRAMUSCULAR | Status: AC
  Filled 2017-03-30: qty 500

## 2017-03-30 MED ORDER — FENTANYL CITRATE (PF) 100 MCG/2ML IJ SOLN
INTRAMUSCULAR | Status: DC | PRN
  Administered 2017-03-30: 50 ug via INTRAVENOUS

## 2017-03-30 MED ORDER — INSULIN ASPART 100 UNIT/ML ~~LOC~~ SOLN
5.0000 [IU] | Freq: Three times a day (TID) | SUBCUTANEOUS | Status: DC
  Administered 2017-03-30 – 2017-03-31 (×3): 5 [IU] via SUBCUTANEOUS
  Filled 2017-03-30 (×3): qty 1

## 2017-03-30 SURGICAL SUPPLY — 15 items
BALLN TREK RX 2.5X20 (BALLOONS) ×2
BALLOON TREK RX 2.5X20 (BALLOONS) ×1 IMPLANT
CATH INFINITI 5FR ANG PIGTAIL (CATHETERS) ×2 IMPLANT
CATH INFINITI 5FR JL4 (CATHETERS) ×2 IMPLANT
CATH INFINITI JR4 5F (CATHETERS) ×2 IMPLANT
CATH VISTA GUIDE 6FR LCB SH (CATHETERS) ×2 IMPLANT
DEVICE INFLAT 30 PLUS (MISCELLANEOUS) ×2 IMPLANT
KIT MANI 3VAL PERCEP (MISCELLANEOUS) ×2 IMPLANT
NEEDLE PERC 18GX7CM (NEEDLE) ×2 IMPLANT
PACK CARDIAC CATH (CUSTOM PROCEDURE TRAY) ×2 IMPLANT
SHEATH AVANTI 6FR X 11CM (SHEATH) ×2 IMPLANT
SHEATH PINNACLE 5F 10CM (SHEATH) ×2 IMPLANT
STENT SIERRA 2.50 X 28 MM (Permanent Stent) ×2 IMPLANT
WIRE EMERALD 3MM-J .035X150CM (WIRE) ×2 IMPLANT
WIRE G HI TQ BMW 190 (WIRE) ×2 IMPLANT

## 2017-03-30 NOTE — Progress Notes (Signed)
[facility] Cardiology [facility] Encounter Note  Patient: Benjamin Chan / Admit Date: 03/27/2017 / Date of Encounter: 03/30/2017, 8:10 AM   Subjective: No further chest pain overnight.  Elevated troponin consistent with non-ST elevation myocardial infarction Catheterization showing normal LV systolic function with ejection fraction of 50% Severe three-vessel coronary artery disease Significant 95% stenosis of proximal graft to obtuse marginal 2 causing current non-ST elevation myocardial infarction Patent LIMA to LAD Patent graft to diagonal 2 80% stenosis of proximal right coronary artery  Review of Systems: Positive for: Vernice of breath Negative for: Vision change, hearing change, syncope, dizziness, nausea, vomiting,diarrhea, bloody stool, stomach pain, cough, congestion, diaphoresis, urinary frequency, urinary pain,skin lesions, skin rashes Others previously listed  Objective: Telemetry: Normal sinus rhythm Physical Exam: Blood pressure 128/66, pulse 70, temperature 98.7 F (37.1 C), temperature source Oral, resp. rate 19, height 5\' 3"  (1.6 m), weight 92.1 kg (203 lb), SpO2 94 %. Body mass index is 35.96 kg/m. General: Well developed, well nourished, in no acute distress. Head: Normocephalic, atraumatic, sclera non-icteric, no xanthomas, nares are without discharge. Neck: No apparent masses Lungs: Normal respirations with no wheezes, no rhonchi, no rales , no crackles   Heart: Regular rate and rhythm, normal S1 S2, no murmur, no rub, no gallop, PMI is normal size and placement, carotid upstroke normal without bruit, jugular venous pressure normal Abdomen: Soft, non-tender, non-distended with normoactive bowel sounds. No hepatosplenomegaly. Abdominal aorta is normal size without bruit Extremities: No edema, no clubbing, no cyanosis, no ulcers,  Peripheral: 2+ radial, 2+ femoral, 2+ dorsal pedal pulses Neuro: Alert and oriented. Moves all extremities spontaneously. Psych:   Responds to questions appropriately with a normal affect.   Intake/Output Summary (Last 24 hours) at 03/30/2017 0810 Last data filed at 03/30/2017 0634 Gross per 24 hour  Intake 523.18 ml  Output 550 ml  Net -26.82 ml    Inpatient Medications:  . aspirin EC  81 mg Oral Daily  . azithromycin  250 mg Oral Daily  . doxazosin  2 mg Oral Daily  . enalapril  20 mg Oral Daily  . finasteride  5 mg Oral Daily  . fluticasone  2 spray Each Nare Daily  . guaiFENesin  600 mg Oral BID  . insulin aspart  0-5 Units Subcutaneous QHS  . insulin aspart  0-9 Units Subcutaneous TID WC  . insulin glargine  50 Units Subcutaneous QHS  . ipratropium-albuterol  3 mL Nebulization BID  . isosorbide mononitrate  60 mg Oral Daily  . metoprolol tartrate  25 mg Oral Daily  . simvastatin  40 mg Oral Daily  . sodium chloride flush  3 mL Intravenous Q12H  . sodium chloride flush  3 mL Intravenous Q12H  . tiotropium  18 mcg Inhalation Daily   Infusions:  . sodium chloride    . [START ON 03/31/2017] sodium chloride Stopped (03/30/17 0655)   Followed by  . [START ON 03/31/2017] sodium chloride 1 mL/kg/hr (03/30/17 0656)  . heparin 1,050 Units/hr (03/29/17 2022)    Labs: Recent Labs    03/28/17 0510 03/30/17 0605  NA 134* 134*  K 3.8 4.2  CL 101 100*  CO2 26 24  GLUCOSE 282* 245*  BUN 30* 33*  CREATININE 1.55* 1.70*  CALCIUM 8.5* 8.4*   No results for input(s): AST, ALT, ALKPHOS, BILITOT, PROT, ALBUMIN in the last 72 hours. Recent Labs    03/29/17 0615 03/30/17 0605  WBC 9.7 9.8  HGB 13.2 12.8*  HCT 40.0 39.0*  MCV  89.0 89.9  PLT 270 283   Recent Labs    03/27/17 2227 03/28/17 0510 03/28/17 1129 03/28/17 1622  TROPONINI 0.25* 0.99* 1.19* 1.09*   Invalid input(s): POCBNP Recent Labs    03/27/17 1300  HGBA1C 12.2*     Weights: Filed Weights   03/27/17 1900 03/30/17 0339 03/30/17 0713  Weight: 92.6 kg (204 lb 1.6 oz) 92.2 kg (203 lb 3.2 oz) 92.1 kg (203 lb)      Radiology/Studies:  Dg Chest 2 View  Result Date: 03/27/2017 CLINICAL DATA:  Chest pressure, pain with eating, shortness of breath, headache, cough, and congestion for 2 weeks, CHF, COPD, diabetes mellitus EXAM: CHEST  2 VIEW COMPARISON:  06/29/2015 FINDINGS: Normal heart size post CABG. Atherosclerotic calcification aorta. Mediastinal contours and pulmonary vascularity normal. Mild RIGHT apical scarring. Lungs otherwise clear. No infiltrate, pleural effusion or pneumothorax. No acute osseous findings. IMPRESSION: RIGHT apical scarring. No acute abnormalities. Electronically Signed   By: Ulyses Southward M.D.   On: 03/27/2017 13:46     Assessment and Recommendation  72 y.o. male with known coronary artery disease status post coronary artery bypass graft essential hypertension mixed hyperlipidemia diabetes with a non-ST elevation myocardial infarction 1.  PCI and stent placement of graft to obtuse marginal 2 2.  Dual antiplatelet therapy for 1 year 3.  High intensity cholesterol therapy 4.  Hypertension control with ACE inhibitor beta-blocker status post myocardial infarction 5.  Get ambulation again cardiac rehabilitation 6.  Possible discharge to home tomorrow if ambulating well on appropriate medication management  Signed, Arnoldo Hooker M.D. FACC

## 2017-03-30 NOTE — Progress Notes (Addendum)
Inpatient Diabetes Program Recommendations  AACE/ADA: New Consensus Statement on Inpatient Glycemic Control (2015)  Target Ranges:  Prepandial:   less than 140 mg/dL      Peak postprandial:   less than 180 mg/dL (1-2 hours)      Critically ill patients:  140 - 180 mg/dL   Lab Results  Component Value Date   GLUCAP 213 (H) 03/30/2017   HGBA1C 12.2 (H) 03/27/2017    Review of Glycemic Control Results for Benjamin Chan, Benjamin Chan (MRN [id number]) as of 03/30/2017 11:11  Ref. Range 03/29/2017 07:47 03/29/2017 11:57 03/29/2017 16:30 03/29/2017 20:54 03/30/2017 06:46 03/30/2017 10:28  Glucose-Capillary Latest Ref Range: 65 - 99 mg/dL 048 (H) 889 (H) 169 (H) 281 (H) 252 (H) 213 (H)   Diabetes history: Type 2 DM Outpatient Diabetes medications: NPH 35 Units QHS Current orders for Inpatient glycemic control: Lantus 50 Units QHS, Novolog 0-9 TIDWC, Novolog 0-5 Units QHS  Inpatient Diabetes Program Recommendations:    Consider adding Novolog 5 Units TIDWC, given that patient consumes >50%. Also, given that recent FBS were >200mg /dL, increase Lantus 55 Units QD. Will plan to follow.  Multiple notes confirm patient's difficulty with affordability of medications. Will plan to assess patient's needs and plan accordingly for outpatient setting.   Addendum: Spoke with patient and verified that home regimen is different that prescribed. Noted Lantus 50 units QHS on home med reconciliation, but patient reports that he is not taking because he cannot afford it. He also reported that he does not qualify for additional assistance programs. Instead, patient takes wife's NPH 35 Units QHS. Metformin caused GI upset and patient is no longer willing to take medication. Discussed action of NPH and duration is not long enough to meet daily insulin needs. Educated patient that he will need to take more than just one injection daily. Patient verbalized that he is okay with increasing insulin frequency. Currently, patient checks  blood sugars once a week. So, education was provided that patient need additional blood glucose checks.  Also, discussed A1C of 12.2% and goals for proper glucose control. Briefly discussed long effects of high A1C and increased blood glucose.  Patient and wife expressed difficulty with medication management because of financial constraints, making it hard to establish good control. Informed about [facility] and that a consult has been placed for additional support. Patient and family welcomed this resource.  Patient goals that were established: Lower A1C, more frequent blood glucose checks, and being open to more insulin.   Will plan to follow back up with patient prior to discharge to discuss plan. Patient verbalized understanding and had no further questions.   Recommendations for discharge planning: Based on current medication regimen and equivocal would be: 70/30 35 Units BID given that patient is eating at 50% or greater with meals.   Thanks, Lujean Rave, MSN, RNC-OB Diabetes Coordinator [phone] (8a-5p)

## 2017-03-30 NOTE — Progress Notes (Signed)
All pre cath orders completed, bilateral groin prepped, consent signed and placed on chart, pt. NPO since midnight.

## 2017-03-30 NOTE — Progress Notes (Signed)
ANTICOAGULATION CONSULT NOTE Pharmacy Consult for heparin drip Indication: chest pain/ACS  No Known Allergies  Patient Measurements: Height: 5\' 3"  (160 cm) Weight: 203 lb (92.1 kg) IBW/kg (Calculated) : 56.9 Heparin Dosing Weight: 78 kg  Vital Signs: Temp: 98.7 F (37.1 C) (01/14 0713) Temp Source: Oral (01/14 0713) BP: 128/66 (01/14 0713) Pulse Rate: 70 (01/14 0713)  Labs: Recent Labs    03/27/17 1845  03/27/17 2354 03/28/17 0510  03/28/17 1129 03/28/17 1622  03/29/17 0615 03/29/17 1614 03/30/17 0605  HGB  --   --   --  13.4  --   --   --   --  13.2  --  12.8*  HCT  --   --   --  41.2  --   --   --   --  40.0  --  39.0*  PLT  --   --   --  274  --   --   --   --  270  --  283  APTT  --   --  31  --   --   --   --   --   --   --   --   LABPROT  --   --  12.8  --   --   --   --   --   --   --   --   INR  --   --  0.97  --   --   --   --   --   --   --   --   HEPARINUNFRC  --   --   --   --    < >  --  0.35   < > 0.31 0.37 0.31  CREATININE 1.86*  --   --  1.55*  --   --   --   --   --   --  1.70*  TROPONINI 0.11*   < >  --  0.99*  --  1.19* 1.09*  --   --   --   --    < > = values in this interval not displayed.    Estimated Creatinine Clearance: 38.9 mL/min (A) (by C-G formula based on SCr of 1.7 mg/dL (H)).   Medical History: Past Medical History:  Diagnosis Date  . BPH (benign prostatic hyperplasia)   . CAD (coronary artery disease)   . Chronic kidney disease   . COPD (chronic obstructive pulmonary disease) (HCC)   . Diabetes mellitus without complication (HCC)   . Hypertension     Medications:    Assessment: No anticoagulation in PTA meds  Goal of Therapy:  Heparin level 0.3-0.7 units/ml Monitor platelets by anticoagulation protocol: Yes   Plan:  HL = 0.35, therapeutic. Previous heparin level was 0.54 and has now dropped to 0.35. Will obtain follow up heparin level in 6 hours to ensure that drip is therapeutic.   Yolanda Bonine, PharmD Pharmacy  Resident 03/30/2017,8:32 AM   01/12 PM heparin level 0.34. Continue current regimen. Recheck heparin level and CBC with tomorrow AM labs.  1/13 AM: Heparin level at low end of goal range, 0.31. Will increase heparin infusion rate to 1050 units/hr and recheck a HL 8 hours after rate change.   1/13 PM: HL 0.37. Will continue current rate of 1050units/hr and recheck HL with AM labs. CBC ordered with AM labs per protocol.   1/14 AM: HL 0.31. Will continue current rate of 1050units/hr and recheck HL  with AM labs.    Luan Pulling, PharmD, BCPS Clinical Pharmacist 03/30/2017 8:32 AM

## 2017-03-30 NOTE — Consult Note (Signed)
   [facility] CM Inpatient Consult   03/30/2017  Chan Benjamin Oct 30, 1945 [id number]    [facility] Care Management referral received.  Chart reviewed. Noted patient is in ICU.  Left voicemail message for inpatient RNCM (Angie) to discuss referral and appropriate time to contact patient since in ICU. Also inquiring on when patient is discharging.  Will await call back from inpatient RNCM prior to engaging for [facility] Care Management services.   Raiford Noble, MSN-Ed, RN,BSN [facility] Liaison [phone]

## 2017-03-30 NOTE — Progress Notes (Signed)
[facility] - [place] at [facility]                                                                                                                                                                                  Patient Demographics   Iver Melley, is a 72 y.o. male, DOB - [birth year], OTR:[id number]  Admit date - 03/27/2017   Admitting Physician Altamese Dilling, MD  Outpatient Primary MD for the patient is Steele Sizer, MD   LOS - 2  Subjective: Patient had a stent placed earlier feeling better denies any chest pain   Review of Systems:   CONSTITUTIONAL: No documented fever. No fatigue, weakness. No weight gain, no weight loss.  EYES: No blurry or double vision.  ENT: No tinnitus. No postnasal drip. No redness of the oropharynx.  RESPIRATORY: Positive cough, no wheeze, no hemoptysis. No dyspnea.  CARDIOVASCULAR: Positive chest pain. No orthopnea. No palpitations. No syncope.  GASTROINTESTINAL: No nausea, no vomiting or diarrhea. No abdominal pain. No melena or hematochezia.  GENITOURINARY: No dysuria or hematuria.  ENDOCRINE: No polyuria or nocturia. No heat or cold intolerance.  HEMATOLOGY: No anemia. No bruising. No bleeding.  INTEGUMENTARY: No rashes. No lesions.  MUSCULOSKELETAL: No arthritis. No swelling. No gout.  NEUROLOGIC: No numbness, tingling, or ataxia. No seizure-type activity.  PSYCHIATRIC: No anxiety. No insomnia. No ADD.    Vitals:   Vitals:   03/30/17 1430 03/30/17 1500 03/30/17 1600 03/30/17 1625  BP: 110/68 (!) 120/51 (!) 112/56   Pulse: 62 62 (!) 58   Resp: 17 17 (!) 21   Temp:    97.9 F (36.6 C)  TempSrc:    Oral  SpO2: 95% 96% 97%   Weight:      Height:        Wt Readings from Last 3 Encounters:  03/30/17 203 lb (92.1 kg)  06/30/15 209 lb 6.4 oz (95 kg)     Intake/Output Summary (Last 24 hours) at 03/30/2017 1745 Last data filed at 03/30/2017 1625 Gross per 24 hour  Intake 1112.99 ml  Output 1575 ml  Net  -462.01 ml    Physical Exam:   GENERAL: Pleasant-appearing in no apparent distress.  HEAD, EYES, EARS, NOSE AND THROAT: Atraumatic, normocephalic. Extraocular muscles are intact. Pupils equal and reactive to light. Sclerae anicteric. No conjunctival injection. No oro-pharyngeal erythema.  NECK: Supple. There is no jugular venous distention. No bruits, no lymphadenopathy, no thyromegaly.  HEART: Regular rate and rhythm,. No murmurs, no rubs, no clicks.  LUNGS: Clear to auscultation bilaterally. No rales or rhonchi. No wheezes.  ABDOMEN: Soft, flat, nontender, nondistended. Has good bowel sounds. No hepatosplenomegaly appreciated.  EXTREMITIES: No  evidence of any cyanosis, clubbing, or peripheral edema.  +2 pedal and radial pulses bilaterally.  NEUROLOGIC: The patient is alert, awake, and oriented x3 with no focal motor or sensory deficits appreciated bilaterally.  SKIN: Moist and warm with no rashes appreciated.  Psych: Not anxious, depressed LN: No inguinal LN enlargement    Antibiotics   Anti-infectives (From admission, onward)   Start     Dose/Rate Route Frequency Ordered Stop   03/29/17 1600  azithromycin (ZITHROMAX) tablet 250 mg     250 mg Oral Daily 03/28/17 1647 04/02/17 1559   03/28/17 1700  azithromycin (ZITHROMAX) tablet 500 mg     500 mg Oral Daily 03/28/17 1647 03/28/17 1715      Medications   Scheduled Meds: . [START ON 03/31/2017] aspirin  81 mg Oral Daily  . azithromycin  250 mg Oral Daily  . doxazosin  2 mg Oral Daily  . enalapril  20 mg Oral Daily  . finasteride  5 mg Oral Daily  . fluticasone  2 spray Each Nare Daily  . guaiFENesin  600 mg Oral BID  . insulin aspart  0-5 Units Subcutaneous QHS  . insulin aspart  0-9 Units Subcutaneous TID WC  . insulin aspart  5 Units Subcutaneous TID WC  . insulin glargine  55 Units Subcutaneous QHS  . ipratropium-albuterol  3 mL Nebulization BID  . isosorbide mononitrate  60 mg Oral Daily  . metoprolol tartrate  25 mg  Oral Daily  . simvastatin  40 mg Oral Daily  . sodium chloride flush  3 mL Intravenous Q12H  . sodium chloride flush  3 mL Intravenous Q12H  . ticagrelor  90 mg Oral BID  . tiotropium  18 mcg Inhalation Daily   Continuous Infusions: . sodium chloride    . sodium chloride 1 mL/kg/hr (03/30/17 1426)   PRN Meds:.sodium chloride, acetaminophen, docusate sodium, guaiFENesin-codeine, morphine injection, nitroGLYCERIN, ondansetron (ZOFRAN) IV, simethicone, sodium chloride flush   Data Review:   Micro Results Recent Results (from the past 240 hour(s))  MRSA PCR Screening     Status: None   Collection Time: 03/30/17 10:29 AM  Result Value Ref Range Status   MRSA by PCR NEGATIVE NEGATIVE Final    Comment:        The GeneXpert MRSA Assay (FDA approved for NASAL specimens only), is one component of a comprehensive MRSA colonization surveillance program. It is not intended to diagnose MRSA infection nor to guide or monitor treatment for MRSA infections. Performed at [facility], [address] [place], [place] [address]     Radiology Reports Dg Chest 2 View  Result Date: 03/27/2017 CLINICAL DATA:  Chest pressure, pain with eating, shortness of breath, headache, cough, and congestion for 2 weeks, CHF, COPD, diabetes mellitus EXAM: CHEST  2 VIEW COMPARISON:  06/29/2015 FINDINGS: Normal heart size post CABG. Atherosclerotic calcification aorta. Mediastinal contours and pulmonary vascularity normal. Mild RIGHT apical scarring. Lungs otherwise clear. No infiltrate, pleural effusion or pneumothorax. No acute osseous findings. IMPRESSION: RIGHT apical scarring. No acute abnormalities. Electronically Signed   By: Ulyses Southward M.D.   On: 03/27/2017 13:46     CBC Recent Labs  Lab 03/27/17 1300 03/28/17 0510 03/29/17 0615 03/30/17 0605  WBC 12.5* 11.1* 9.7 9.8  HGB 14.6 13.4 13.2 12.8*  HCT 43.4 41.2 40.0 39.0*  PLT 296 274 270 283  MCV 88.5 89.8 89.0 89.9  MCH 29.8 29.1 29.5  29.6  MCHC 33.6 32.5 33.1 32.9  RDW 13.4 13.4  13.4 13.7    Chemistries  Recent Labs  Lab 03/27/17 1300 03/27/17 1845 03/28/17 0510 03/30/17 0605  NA 130*  --  134* 134*  K 4.6  --  3.8 4.2  CL 97*  --  101 100*  CO2 21*  --  26 24  GLUCOSE 440*  --  282* 245*  BUN 29*  --  30* 33*  CREATININE 1.76* 1.86* 1.55* 1.70*  CALCIUM 8.9  --  8.5* 8.4*   ------------------------------------------------------------------------------------------------------------------ estimated creatinine clearance is 38.9 mL/min (A) (by C-G formula based on SCr of 1.7 mg/dL (H)). ------------------------------------------------------------------------------------------------------------------ No results for input(s): HGBA1C in the last 72 hours. ------------------------------------------------------------------------------------------------------------------ Recent Labs    03/29/17 0615  CHOL 195  HDL 42  LDLCALC 131*  TRIG 109  CHOLHDL 4.6   ------------------------------------------------------------------------------------------------------------------ No results for input(s): TSH, T4TOTAL, T3FREE, THYROIDAB in the last 72 hours.  Invalid input(s): FREET3 ------------------------------------------------------------------------------------------------------------------ No results for input(s): VITAMINB12, FOLATE, FERRITIN, TIBC, IRON, RETICCTPCT in the last 72 hours.  Coagulation profile Recent Labs  Lab 03/27/17 2354  INR 0.97    No results for input(s): DDIMER in the last 72 hours.  Cardiac Enzymes Recent Labs  Lab 03/28/17 0510 03/28/17 1129 03/28/17 1622  TROPONINI 0.99* 1.19* 1.09*   ------------------------------------------------------------------------------------------------------------------ Invalid input(s): POCBNP    Assessment & Plan   Patient is a 72 year old African-American male presenting with chest pain cough and congestion  1.  Non-ST MI status post  stent-  Continue aspirin Continue metoprolol Continue Zocor Continue Brilinta  2.  Acute bronchitis Continue oral azithromycin Continue antitussive medication  3.  Essential hypertension continue metoprolol  4.  Diabetes type 2 continue sliding scale insulin continue Lantus  5.  BPH continue Proscar and Cardura     Code Status Orders  (From admission, onward)        Start     Ordered   03/27/17 1810  Full code  Continuous     03/27/17 1809    Code Status History    Date Active Date Inactive Code Status Order ID Comments User Context   06/29/2015 23:49 07/02/2015 20:00 Full Code [id number]  Ihor Austin, MD ED           Consults cards   DVT Prophylaxis  heparin  Lab Results  Component Value Date   PLT 283 03/30/2017     Time Spent in minutes   Greater than 50% of time spent in care coordination and counseling patient regarding the condition and plan of care.   Auburn Bilberry M.D on 03/30/2017 at 5:45 PM  Between 7am to 6pm - Pager - [phone]  After 6pm go to [email or web address] - password [email or web address] [facility]  [facility]   Office  [phone]

## 2017-03-30 NOTE — Progress Notes (Signed)
Patient brought to ICU after cardiac catherization for sheath removal. Patient with no complaints of pain, no nausea, and good appetite. Patient's sheath removed 2 hours after angiomax stopped, manual hold by this RN. No signs of bleeding at site, good distal pulses.

## 2017-03-30 NOTE — Progress Notes (Signed)
Specials for cath lab given report on pt. CCMD notified pt. Left the floor for cath lab. Wife at bedside.

## 2017-03-31 LAB — BASIC METABOLIC PANEL
ANION GAP: 8 (ref 5–15)
BUN: 26 mg/dL — ABNORMAL HIGH (ref 6–20)
CO2: 24 mmol/L (ref 22–32)
Calcium: 8.5 mg/dL — ABNORMAL LOW (ref 8.9–10.3)
Chloride: 105 mmol/L (ref 101–111)
Creatinine, Ser: 1.45 mg/dL — ABNORMAL HIGH (ref 0.61–1.24)
GFR calc Af Amer: 54 mL/min — ABNORMAL LOW (ref >60)
GFR, EST NON AFRICAN AMERICAN: 46 mL/min — AB (ref >60)
Glucose, Bld: 133 mg/dL — ABNORMAL HIGH (ref 65–99)
POTASSIUM: 4 mmol/L (ref 3.5–5.1)
SODIUM: 137 mmol/L (ref 135–145)

## 2017-03-31 LAB — GLUCOSE, CAPILLARY
GLUCOSE-CAPILLARY: 101 mg/dL — AB (ref 65–99)
Glucose-Capillary: 134 mg/dL — ABNORMAL HIGH (ref 65–99)
Glucose-Capillary: 209 mg/dL — ABNORMAL HIGH (ref 65–99)

## 2017-03-31 MED ORDER — ATORVASTATIN CALCIUM 40 MG PO TABS: 40.0000 mg | ORAL_TABLET | Freq: Every day | ORAL | 2 refills | Status: DC

## 2017-03-31 MED ORDER — GUAIFENESIN ER 600 MG PO TB12: 600.0000 mg | ORAL_TABLET | Freq: Two times a day (BID) | ORAL | 0 refills | Status: AC

## 2017-03-31 MED ORDER — ENALAPRIL MALEATE 20 MG PO TABS: 20.0000 mg | ORAL_TABLET | Freq: Every day | ORAL | 0 refills | Status: DC

## 2017-03-31 MED ORDER — GUAIFENESIN-DM 100-10 MG/5ML PO SYRP: 5.0000 mL | ORAL_SOLUTION | ORAL | 0 refills | Status: DC | PRN

## 2017-03-31 MED ORDER — AZITHROMYCIN 250 MG PO TABS: ORAL_TABLET | ORAL | 0 refills | Status: DC

## 2017-03-31 MED ORDER — INSULIN GLARGINE 100 UNIT/ML ~~LOC~~ SOLN: 55.0000 [IU] | Freq: Every day | SUBCUTANEOUS | 11 refills | Status: DC

## 2017-03-31 MED ORDER — METOPROLOL TARTRATE 25 MG PO TABS: 25.0000 mg | ORAL_TABLET | Freq: Two times a day (BID) | ORAL | 2 refills | Status: DC

## 2017-03-31 MED ORDER — TICAGRELOR 90 MG PO TABS: 90.0000 mg | ORAL_TABLET | Freq: Two times a day (BID) | ORAL | 2 refills | Status: DC

## 2017-03-31 MED ORDER — GUAIFENESIN ER 600 MG PO TB12: 600.0000 mg | ORAL_TABLET | Freq: Two times a day (BID) | ORAL | 0 refills | Status: DC

## 2017-03-31 NOTE — Discharge Instructions (Addendum)
[facility] - [place] at [facility] MI / NSTEMI Discharge Checklist  Aspirin prescribed at discharge:   Yes   High Intensity Statin Prescribed? (Lipitor 40-80mg  or Crestor 20-40mg ):Yes   Beta Blocker Prescribed: Yes   ADP Receptor Inhibitor Prescribed? (i.e. Plavix etc.- Includes Medically Managed Patients): Yes   Was EF assessed during THIS hospitalization? Yes  (YES = Measured in current episode of care or document plan to evaluate after discharge.)  Was EF < 40%?   no For EF < 40%, was ACE/ARB prescribed?  For EF < 40%, was Aldosterone Inhibitor prescribed?   (If contraindicated, provide explanation.)    Was Cardiac Rehab Phase II ordered prior to discharge? (Includes Medically Managed Patients): Yes      DIET:  Diabetic diet, cardiac diet  DISCHARGE CONDITION:  Stable  ACTIVITY:  Activity as tolerated  OXYGEN:  Home Oxygen: No.   Oxygen Delivery: room air  DISCHARGE LOCATION:  home    ADDITIONAL DISCHARGE INSTRUCTION: make sure to take all medications   If you experience worsening of your admission symptoms, develop shortness of breath, life threatening emergency, suicidal or homicidal thoughts you must seek medical attention immediately by calling 911 or calling your MD immediately  if symptoms less severe.  You Must read complete instructions/literature along with all the possible adverse reactions/side effects for all the Medicines you take and that have been prescribed to you. Take any new Medicines after you have completely understood and accpet all the possible adverse reactions/side effects.   Please note  You were cared for by a hospitalist during your hospital stay. If you have any questions about your discharge medications or the care you received while you were in the hospital after you are discharged, you can call the unit and asked to speak with the hospitalist on call if the hospitalist that took care of you is not available. Once  you are discharged, your primary care physician will handle any further medical issues. Please note that NO REFILLS for any discharge medications will be authorized once you are discharged, as it is imperative that you return to your primary care physician (or establish a relationship with a primary care physician if you do not have one) for your aftercare needs so that they can reassess your need for medications and monitor your lab values.

## 2017-03-31 NOTE — Progress Notes (Signed)
[facility] Cardiology [facility] Encounter Note  Patient: Benjamin Chan / Admit Date: 03/27/2017 / Date of Encounter: 03/31/2017, 5:26 AM   Subjective: No further chest pain overnight.  Elevated troponin consistent with non-ST elevation myocardial infarction.  Tolerating medications well Catheterization showing normal LV systolic function with ejection fraction of 50% Severe three-vessel coronary artery disease Significant 95% stenosis of proximal graft to obtuse marginal 2 causing current non-ST elevation myocardial infarction Patent LIMA to LAD Patent graft to diagonal 2 80% stenosis of proximal right coronary artery  Review of Systems: Positive for: None Negative for: Vision change, hearing change, syncope, dizziness, nausea, vomiting,diarrhea, bloody stool, stomach pain, cough, congestion, diaphoresis, urinary frequency, urinary pain,skin lesions, skin rashes Others previously listed  Objective: Telemetry: Normal sinus rhythm Physical Exam: Blood pressure 106/65, pulse 76, temperature 98.7 F (37.1 C), temperature source Oral, resp. rate 18, height 5\' 3"  (1.6 m), weight 92.1 kg (203 lb), SpO2 95 %. Body mass index is 35.96 kg/m. General: Well developed, well nourished, in no acute distress. Head: Normocephalic, atraumatic, sclera non-icteric, no xanthomas, nares are without discharge. Neck: No apparent masses Lungs: Normal respirations with no wheezes, no rhonchi, no rales , no crackles   Heart: Regular rate and rhythm, normal S1 S2, no murmur, no rub, no gallop, PMI is normal size and placement, carotid upstroke normal without bruit, jugular venous pressure normal Abdomen: Soft, non-tender, non-distended with normoactive bowel sounds. No hepatosplenomegaly. Abdominal aorta is normal size without bruit Extremities: No edema, no clubbing, no cyanosis, no ulcers,  Peripheral: 2+ radial, 2+ femoral, 2+ dorsal pedal pulses Neuro: Alert and oriented. Moves all extremities  spontaneously. Psych:  Responds to questions appropriately with a normal affect.   Intake/Output Summary (Last 24 hours) at 03/31/2017 0526 Last data filed at 03/31/2017 0100 Gross per 24 hour  Intake 1841.39 ml  Output 2050 ml  Net -208.61 ml    Inpatient Medications:  . aspirin  81 mg Oral Daily  . azithromycin  250 mg Oral Daily  . doxazosin  2 mg Oral Daily  . enalapril  20 mg Oral Daily  . finasteride  5 mg Oral Daily  . fluticasone  2 spray Each Nare Daily  . guaiFENesin  600 mg Oral BID  . insulin aspart  0-5 Units Subcutaneous QHS  . insulin aspart  0-9 Units Subcutaneous TID WC  . insulin aspart  5 Units Subcutaneous TID WC  . insulin glargine  55 Units Subcutaneous QHS  . ipratropium-albuterol  3 mL Nebulization BID  . isosorbide mononitrate  60 mg Oral Daily  . metoprolol tartrate  25 mg Oral Daily  . simvastatin  40 mg Oral Daily  . sodium chloride flush  3 mL Intravenous Q12H  . sodium chloride flush  3 mL Intravenous Q12H  . ticagrelor  90 mg Oral BID  . tiotropium  18 mcg Inhalation Daily   Infusions:  . sodium chloride      Labs: Recent Labs    03/30/17 0605 03/31/17 0352  NA 134* 137  K 4.2 4.0  CL 100* 105  CO2 24 24  GLUCOSE 245* 133*  BUN 33* 26*  CREATININE 1.70* 1.45*  CALCIUM 8.4* 8.5*   No results for input(s): AST, ALT, ALKPHOS, BILITOT, PROT, ALBUMIN in the last 72 hours. Recent Labs    03/29/17 0615 03/30/17 0605  WBC 9.7 9.8  HGB 13.2 12.8*  HCT 40.0 39.0*  MCV 89.0 89.9  PLT 270 283   Recent Labs  03/28/17 1129 03/28/17 1622  TROPONINI 1.19* 1.09*   Invalid input(s): POCBNP No results for input(s): HGBA1C in the last 72 hours.   Weights: Filed Weights   03/27/17 1900 03/30/17 0339 03/30/17 0713  Weight: 92.6 kg (204 lb 1.6 oz) 92.2 kg (203 lb 3.2 oz) 92.1 kg (203 lb)     Radiology/Studies:  Dg Chest 2 View  Result Date: 03/27/2017 CLINICAL DATA:  Chest pressure, pain with eating, shortness of breath,  headache, cough, and congestion for 2 weeks, CHF, COPD, diabetes mellitus EXAM: CHEST  2 VIEW COMPARISON:  06/29/2015 FINDINGS: Normal heart size post CABG. Atherosclerotic calcification aorta. Mediastinal contours and pulmonary vascularity normal. Mild RIGHT apical scarring. Lungs otherwise clear. No infiltrate, pleural effusion or pneumothorax. No acute osseous findings. IMPRESSION: RIGHT apical scarring. No acute abnormalities. Electronically Signed   By: Ulyses Southward M.D.   On: 03/27/2017 13:46     Assessment and Recommendation  72 y.o. male with known coronary artery disease status post coronary artery bypass graft essential hypertension mixed hyperlipidemia diabetes with a non-ST elevation myocardial infarction 1.  Successful PCI and stent placement of graft to obtuse marginal 2 2.  Dual antiplatelet therapy for 1 year 3.  High intensity cholesterol therapy 4.  Hypertension control with ACE inhibitor beta-blocker status post myocardial infarction 5.  Again ambulation again cardiac rehabilitation 6.  Possible discharge to home today if ambulating well on appropriate medication management with follow-up in 1-2 weeks  Signed, Arnoldo Hooker M.D. FACC

## 2017-03-31 NOTE — Consult Note (Signed)
   [facility] CM Inpatient Consult   03/31/2017  Benjamin Chan 06-19-1945 [id number]   Referral received from inpatient case manager for [facility] Care Management services and post hospital discharge follow up related to a diagnosis of CKD, COPD, DM and patient stated medication affordability. Patient was evaluated for community based chronic disease management services with [facility] care Management Program as a benefit of patient's [facility] Medicare. Called into patient's room to explain [facility] Care Management services, patient requested I speak with his spouse Blenda Peals. Patient endorses his listed primary care provider to be Vonita Moss but spouse states he receives most of his care at the [facility] in [place]. She states he would like to find a PCP in town. Spouse states they have three sons who take them to their appointments. Spouse states patient has several medications that are extremely expensive, the two that are the hardest for them to afford are his Spiriva and his Insulin. Spouse requested to speak with [facility] Pharmacist about medications. Verbal consent received . Patient gave [phone] as the best number to reach him. He also gave verbal permission to speak with his spouse Blenda Peals and actually preferred you speak with her. She has dialysis T/TH/Sat but is usually home after 10am. Patient will receive post hospital discharge calls and be evaluated for monthly home visits, and a call from a [facility] Pharmacist.  [facility] Care Management services does not interfere with or replace any services arranged by the inpatient care management team. Made inpatient RNCM aware that [facility] will be following for care management. For additional questions please contact:   Cougar Imel RN, BSN [facility] Liaison  [phone]) Business Mobile [phone]) Toll free office

## 2017-03-31 NOTE — Care Management (Signed)
Late note entry: RNCM met with patient prior to discharge to discuss transition of care. He plans to return home with his wife. He is familiar with outpatient cardiac rehab and denies any problems attending. His PCP is in [place] and requests assistance with a provider closer to his home address. He also states his insurance does not help him very much with cost of medications.  He agrees to [facility] referral and has accepted their services. Brilinta voucher provider to assist patient with that.

## 2017-03-31 NOTE — Progress Notes (Signed)
Patient left ICU by wheelchair with volunteer and family at side and no distress noted.   Patient discharged home.

## 2017-03-31 NOTE — Progress Notes (Signed)
Discharge instructions given to and reviewed with patient and his wife.  Patient verbalized understanding of all discharge instructions including new prescriptions and follow up appointments.  Patient informed this nurse that his pharmacy is [facility] on [address] therefore RN called and spoke with Dr. Allena Katz and made him aware and MD stated he would call in the prescriptions to the correct [facility].  Patient ambulated and tolerated well with no shortness of breath and no pain.  Patient waiting for ride home.

## 2017-03-31 NOTE — Discharge Summary (Signed)
[facility] [place] at [facility], 72 y.o., DOB [birth year], MRN [id number]. Admission date: 03/27/2017 Discharge Date 03/31/2017 Primary MD Steele Sizer, MD Admitting Physician Altamese Dilling, MD  Admission Diagnosis  Elevated troponin [R74.8] Chest pain, unspecified type [R07.9]  Discharge Diagnosis   Principal Problem: Non-ST MI status post stent Acute bronchitis Essential hypertension Diabetes type 2 BPH Hyperlipidemia   Hospital Course  She is 72 year old African-American male presented with chest pain and congestion.  Patient had elevated troponin and EKG consistent with non-ST MI.  He was seen by cardiology and taken to a cardiac cath lab. Patient had PCI and stent placement of the graft to the obtuse marginal 2.  The patient tolerated the procedure he is not having any further chest pain.  He also had a acute bronchitis which was treated with antibiotics.  His breathing is much improved.  He is stable to discharge home with cardiology outpatient follow-up.           Consults  cardiology  Significant Tests:  See full reports for all details     Dg Chest 2 View  Result Date: 03/27/2017 CLINICAL DATA:  Chest pressure, pain with eating, shortness of breath, headache, cough, and congestion for 2 weeks, CHF, COPD, diabetes mellitus EXAM: CHEST  2 VIEW COMPARISON:  06/29/2015 FINDINGS: Normal heart size post CABG. Atherosclerotic calcification aorta. Mediastinal contours and pulmonary vascularity normal. Mild RIGHT apical scarring. Lungs otherwise clear. No infiltrate, pleural effusion or pneumothorax. No acute osseous findings. IMPRESSION: RIGHT apical scarring. No acute abnormalities. Electronically Signed   By: Ulyses Southward M.D.   On: 03/27/2017 13:46       Today   Subjective:   Benjamin Chan patient feels well denies any chest pain  Objective:   Blood pressure 139/70, pulse 76, temperature 98.4 F (36.9 C),  temperature source Oral, resp. rate (!) 24, height 5\' 3"  (1.6 m), weight 203 lb (92.1 kg), SpO2 96 %.  .  Intake/Output Summary (Last 24 hours) at 03/31/2017 1640 Last data filed at 03/31/2017 1200 Gross per 24 hour  Intake 784.2 ml  Output 1375 ml  Net -590.8 ml    Exam VITAL SIGNS: Blood pressure 139/70, pulse 76, temperature 98.4 F (36.9 C), temperature source Oral, resp. rate (!) 24, height 5\' 3"  (1.6 m), weight 203 lb (92.1 kg), SpO2 96 %.  GENERAL:  72 y.o.-year-old patient lying in the bed with no acute distress.  EYES: Pupils equal, round, reactive to light and accommodation. No scleral icterus. Extraocular muscles intact.  HEENT: Head atraumatic, normocephalic. Oropharynx and nasopharynx clear.  NECK:  Supple, no jugular venous distention. No thyroid enlargement, no tenderness.  LUNGS: Normal breath sounds bilaterally, no wheezing, rales,rhonchi or crepitation. No use of accessory muscles of respiration.  CARDIOVASCULAR: S1, S2 normal. No murmurs, rubs, or gallops.  ABDOMEN: Soft, nontender, nondistended. Bowel sounds present. No organomegaly or mass.  EXTREMITIES: No pedal edema, cyanosis, or clubbing.  NEUROLOGIC: Cranial nerves II through XII are intact. Muscle strength 5/5 in all extremities. Sensation intact. Gait not checked.  PSYCHIATRIC: The patient is alert and oriented x 3.  SKIN: No obvious rash, lesion, or ulcer.   Data Review     CBC w Diff:  Lab Results  Component Value Date   WBC 9.8 03/30/2017   HGB 12.8 (L) 03/30/2017   HCT 39.0 (L) 03/30/2017   PLT 283 03/30/2017   LYMPHOPCT 7 06/30/2015   MONOPCT 6 06/30/2015   EOSPCT 0  06/30/2015   BASOPCT 0 06/30/2015   CMP:  Lab Results  Component Value Date   NA 137 03/31/2017   K 4.0 03/31/2017   CL 105 03/31/2017   CO2 24 03/31/2017   BUN 26 (H) 03/31/2017   CREATININE 1.45 (H) 03/31/2017   PROT 6.3 (L) 06/30/2015   ALBUMIN 2.9 (L) 06/30/2015   BILITOT 0.8 06/30/2015   ALKPHOS 71 06/30/2015   AST  17 06/30/2015   ALT 13 (L) 06/30/2015  .  Micro Results Recent Results (from the past 240 hour(s))  MRSA PCR Screening     Status: None   Collection Time: 03/30/17 10:29 AM  Result Value Ref Range Status   MRSA by PCR NEGATIVE NEGATIVE Final    Comment:        The GeneXpert MRSA Assay (FDA approved for NASAL specimens only), is one component of a comprehensive MRSA colonization surveillance program. It is not intended to diagnose MRSA infection nor to guide or monitor treatment for MRSA infections. Performed at [facility], [address] [place], [place] [address]      Code Status History    Date Active Date Inactive Code Status Order ID Comments User Context   03/27/2017 18:09 03/31/2017 16:03 Full Code [id number]  Altamese Dilling, MD Inpatient   06/29/2015 23:49 07/02/2015 20:00 Full Code [id number]  Ihor Austin, MD ED    Advance Directive Documentation     Most Recent Value  Type of Advance Directive  Healthcare Power of Attorney  Pre-existing out of facility DNR order (yellow form or pink MOST form)  No data  "MOST" Form in Place?  No data          Follow-up Information    [name], [name], MD In 1 week.   Specialty:  Family Medicine Why:  Dr. Dossie Arbour is not seeing new patients appointment set for 845 on January 25 with PA.  please bring cards and meds   Contact information: [address] [place] [address] [phone]        Lamar Blinks, MD In 10 days.   Specialty:  Cardiology Why:  appointment set for  04/14/17 at 930 with Dr. Gwen Pounds in [facility] information: [address] [place] [facility] [place] [address] [phone]           Discharge Medications   Allergies as of 03/31/2017   No Known Allergies     Medication List    STOP taking these medications   chlorthalidone 25 MG tablet Commonly known as:  HYGROTON     TAKE these medications   acetaminophen 325 MG  tablet Commonly known as:  TYLENOL Take 650 mg by mouth every 6 (six) hours as needed.   aspirin 81 MG tablet Take 81 mg by mouth daily.   atorvastatin 40 MG tablet Commonly known as:  LIPITOR Take 1 tablet (40 mg total) by mouth daily.   azithromycin 250 MG tablet Commonly known as:  ZITHROMAX One tab daily 2 more days   doxazosin 2 MG tablet Commonly known as:  CARDURA Take 2 mg by mouth daily.   enalapril 20 MG tablet Commonly known as:  VASOTEC Take 1 tablet (20 mg total) by mouth daily.   finasteride 5 MG tablet Commonly known as:  PROSCAR Take 1 tablet by mouth daily.   guaiFENesin 600 MG 12 hr tablet Commonly known as:  MUCINEX Take 1 tablet (600 mg total) by mouth 2 (two) times daily for 5 days.  guaiFENesin-dextromethorphan 100-10 MG/5ML syrup Commonly known as:  ROBITUSSIN DM Take 5 mLs by mouth every 4 (four) hours as needed for cough.   insulin glargine 100 UNIT/ML injection Commonly known as:  LANTUS Inject 0.55 mLs (55 Units total) into the skin at bedtime. What changed:  how much to take   metoprolol tartrate 25 MG tablet Commonly known as:  LOPRESSOR Take 1 tablet (25 mg total) by mouth 2 (two) times daily. What changed:  when to take this   nitroGLYCERIN 0.4 MG SL tablet Commonly known as:  NITROSTAT Place 0.4 mg under the tongue.   ticagrelor 90 MG Tabs tablet Commonly known as:  BRILINTA Take 1 tablet (90 mg total) by mouth 2 (two) times daily.   tiotropium 18 MCG inhalation capsule Commonly known as:  SPIRIVA Place 18 mcg into inhaler and inhale daily.          Total Time in preparing paper work, data evaluation and todays exam - 35 minutes  Auburn Bilberry M.D on 03/31/2017 at 4:40 PM  [facility]   Office  [phone]

## 2017-04-01 ENCOUNTER — Other Ambulatory Visit: Payer: Self-pay | Admitting: *Deleted

## 2017-04-01 ENCOUNTER — Encounter: Payer: Self-pay | Admitting: *Deleted

## 2017-04-01 ENCOUNTER — Other Ambulatory Visit: Payer: Self-pay | Admitting: Pharmacy Technician

## 2017-04-01 ENCOUNTER — Telehealth: Payer: Self-pay | Admitting: Pharmacist

## 2017-04-01 NOTE — Patient Outreach (Signed)
Transition of care call:  Successful telephone encounter to Benjamin Chan, 72 year old male for follow up on referral received 03/31/17 from [place] RN [facility] hospital liaison for Community CM services/transition of care/recent hospitalization January 11-15,2019 for chest pain, NON ST MI, left heart cath done  03/30/17.   Pt's history includes but not limited to CAD, HTN, Hyperlipidemia, DM.  Informed pt  Goes to dialysis Tuesday, Thursday, Saturday.     Spoke with pt, HIPAA identifiers verified, discussed purpose of call- follow up for transition  Of care/recent hospitalization.   Pt reports doing okay since discharge home, taking it easy, Knows when to call MD, MD follow up appointments scheduled but does know when.  Pt  Requested RN CM speak with spouse Benjamin Chan) to whom reports pt to see Heart MD 1/25 \ and PCP 1/29. Spouse reports this is pt's second heart attack, more motivated this time to manage health, Spouse reports pt taking  his heart medications, all the medications that were called into His pharmacy were picked up yesterday, still need to pick up Aspirin, cough medication and  Breathing medication (substitute for Spiriva).   Spouse reports pt has not been taking Lantus,  Was suppose to give it to him yesterday, been taking her NPH insulin.   RN CM discussed  With spouse the difference between the NPH and Lantus, need for pt to call MD/request  Prescription be called into pharmacy to which she agreed to do.   This RN CM unable to Review discharge medications as both she and pt currently not at home,discharge papers  There.  Spouse reports daughter filled his pill box.  RN CM discussed with spouse [facility] transition of care program- follow pt for  31 days (weekly Calls, a home visit), requested doing a joint home visit with [name] [facility] pharmacist 04/06/17 To which she agreed.   Plan:  As discussed with spouse, plan to follow up with pt again  next week for initial home visit.   Barrier letter sent to Dr. Dossie Arbour informing of [facility] involvement.   Shayne Alken.   Pierzchala RN CCM [facility] Care Management  [phone]  .

## 2017-04-01 NOTE — Patient Outreach (Signed)
[facility]) Care Management  04/01/2017  Benjamin Chan 1945/07/19 [id number]  Received [employer] patient assistance referral from Pharmacist [name] for Spiriva inhaler. I faxed the provider portion of application to Dr. Dossie Arbour. [name] will patient portion to [name] when she goes for home visit.  [name] [name] [facility] Care Management [phone]

## 2017-04-01 NOTE — Patient Outreach (Signed)
[facility]) Care Management  04/01/2017  Benjamin Chan 10/03/1945 [id number]  72 year old male referred to [facility] Care Management for transition of care services following recent hospitalization.  Benjamin Chan Pharmacy services requested for medication assistance and medication reconciliation.  PMHx includes, but not limited to, CAD s/p prior CABG, chronic kidney disease stage III, COPD, diabetes mellitus requiring insulin, HTN, BPH and history of medication noncompliance.  Patient recently stopped taking medications for the last month and was hospitalized 1/11 - 03/31/17 for NSTEMI s/p angioplasty and stent and bronchitis, discharged home on ticagrelor, aspirin, and azithromycin.  Per notes, patient expressed interest in finding a provider closer to Benjamin home.  He currently sees Dr. Dossie Arbour.  Patient reports trouble affording insulin and inhaler, Spiriva.    Subjective:  Successful telephone call to Benjamin Chan and Benjamin Chan, Benjamin Chan.  HIPAA identifiers verified.   Benjamin Chan reports Benjamin Chan picked up Benjamin new medications for him, including ticagrelor and azithromycin, and that he is taking them.  He reports that he does not have aspirin at home but is going to buy it from the store today.  We verified that he should be taking aspirin 81mg , not aspirin 325mg .    He reports he cannot afford the insulin prescribed for him therefore uses Benjamin Chan's NPH insulin.  He reports he cannot afford Benjamin inhaler.    He estimates that he and Chan's combin income = $1,045 + $800 = $1,845 / month or $22,140 / year for a household of 2.  Benjamin Chan reports she uses PACE services.  He reports he has [place] Medicaid but no other insurance.   Objective:  Hemoglobin A1C = 12.2 (03/27/17)   Assessment:  Medication Reconciliation: I am unable to review medications telephonically with patient as he cannot reliably tell me what he is taking.  I will schedule a home visit with him to more accurately  perform post-discharge medication reconciliation.   Medication assistance:  Patient unable to afford insulin, spiriva, and likely ticagrelor after this month.      He may be eligible for Extra Help / LIS.    Other options include:   Specific patient assistance application programs  Brilinta: [place] requires 3% TROOP, however if patient has [place] Medicaid, not eligible.  If unable to aff  Lantus: Sanofi requires 5% TROOP.  Could consider changing to NPH to buy OTC from [facility] $25/vial  Spiriva: BI - no TROOP.    [employer] has OTC program for aspirin and other OTC medications and supplies  [employer] 90 day supply co-pay = $90 rather than paying $45/month  Care coordination call placed to patient's pharmacy, [facility] on [address].   Patient has [employer] Medicare Part D + Medicare A + B on file.   Confirmed prescriptions for azithromycin and ticagrelor were picked up yesterday.  Copay for ticaregrelor = $45.   Insulin and tiotropium both last filled in 2017.    Patient and Chan agreeable to a home visit to review medications and patient assistance options.  They declined a home visit today and prefer to schedule a visit next Monday at 1:00 PM.    Plan: I will visit with Benjamin Chan and Benjamin Chan at their home next Monday, January 21, at 1:00 PM.  I will alert [facility] RN of scheduled visit.  I will start the process of applying for Spiriva PAP.   [name], PharmD, [facility] Clinical Pharmacist [facility] [phone]

## 2017-04-03 ENCOUNTER — Other Ambulatory Visit: Payer: Self-pay | Admitting: Pharmacy Technician

## 2017-04-03 NOTE — Patient Outreach (Signed)
[facility]) Care Management  04/03/2017  LEXINGTON DOHRN March 12, 1946 [id number]   Received [employer] (Spiriva) provider portion of application as well as a hard copy prescription for medication.  [name] [name] [facility] Care Management [phone]

## 2017-04-06 ENCOUNTER — Ambulatory Visit: Payer: Self-pay | Admitting: Pharmacist

## 2017-04-06 ENCOUNTER — Other Ambulatory Visit: Payer: Self-pay | Admitting: *Deleted

## 2017-04-06 ENCOUNTER — Encounter: Payer: Self-pay | Admitting: *Deleted

## 2017-04-06 NOTE — Patient Outreach (Signed)
[facility]) Care Management   04/06/2017  Benjamin Chan [birth year] [id number]  Benjamin Chan is an 72 y.o. male  Subjective:  Pt reports having a little sob walking, no chest pain, to see Heart MD Benjamin Chan next week.   Spouse reports daughter fixes pt's pill box  Weekly, pt not  taking Spiriva and Lantus because of the cost.  Spouse  Reports pt continues to use her NPH which MD in [place] is aware, told pt  To take 35 units bid.  Pt reports his sugar today was 197, been running under 200. Pt reports to see new PCP (local) this week.     Objective:   Vitals:   04/06/17 1325  BP: (!) 116/50  Pulse: (!) 52  Resp: (!) 28  SpO2: 97%    ROS  Physical Exam  Constitutional: He is oriented to person, place, and time. He appears well-developed and well-nourished.  Cardiovascular: Regular rhythm.  Low HR 52.    Respiratory: Effort normal.  Diminished lung sounds anterior left upper/lower lobes.   GI: Soft. Bowel sounds are normal.  Musculoskeletal: Normal range of motion. He exhibits no edema.  Neurological: He is alert and oriented to person, place, and time.  Skin: Skin is warm and dry.  Psychiatric: He has a normal mood and affect. His behavior is normal. Judgment and thought content normal.    Encounter Medications:   Outpatient Encounter Medications as of 04/06/2017  Medication Sig  . acetaminophen (TYLENOL) 325 MG tablet Take 650 mg by mouth every 6 (six) hours as needed.  [name] aspirin 81 MG tablet Take 81 mg by mouth daily.  [name] atorvastatin (LIPITOR) 40 MG tablet Take 1 tablet (40 mg total) by mouth daily.  [name] doxazosin (CARDURA) 2 MG tablet Take 2 mg by mouth daily.  . enalapril (VASOTEC) 20 MG tablet Take 1 tablet (20 mg total) by mouth daily.  . finasteride (PROSCAR) 5 MG tablet Take 1 tablet by mouth daily.  [name] guaiFENesin-dextromethorphan (ROBITUSSIN DM) 100-10 MG/5ML syrup Take 5 mLs by mouth every 4 (four) hours as needed for cough.  .  insulin NPH Human (HUMULIN N,NOVOLIN N) 100 UNIT/ML injection Inject into the skin. 35 units twice a day.  . metoprolol tartrate (LOPRESSOR) 25 MG tablet Take 1 tablet (25 mg total) by mouth 2 (two) times daily.  . ticagrelor (BRILINTA) 90 MG TABS tablet Take 1 tablet (90 mg total) by mouth 2 (two) times daily.  [name] azithromycin (ZITHROMAX) 250 MG tablet One tab daily 2 more days (Patient not taking: Reported on 04/06/2017)  . insulin glargine (LANTUS) 100 UNIT/ML injection Inject 0.55 mLs (55 Units total) into the skin at bedtime. (Patient not taking: Reported on 04/06/2017)  . nitroGLYCERIN (NITROSTAT) 0.4 MG SL tablet Place 0.4 mg under the tongue.  . tiotropium (SPIRIVA) 18 MCG inhalation capsule Place 18 mcg into inhaler and inhale daily.   No facility-administered encounter medications on file as of 04/06/2017.     Functional Status:   In your present state of health, do you have any difficulty performing the following activities: 04/06/2017 03/27/2017  Hearing? N N  Vision? N N  Difficulty concentrating or making decisions? N N  Walking or climbing stairs? N N  Dressing or bathing? N N  Doing errands, shopping? N N  Preparing Food and eating ? N -  Using the Toilet? N -  In the past six months, have you accidently leaked urine? N -  Do you have  problems with loss of bowel control? N -  Managing your Medications? Y -  Managing your Finances? Y -  Housekeeping or managing your Housekeeping? N -  Some recent data might be hidden    Fall/Depression Screening:    Fall Risk  04/06/2017  Falls in the past year? No   PHQ 2/9 Scores 04/06/2017  PHQ - 2 Score 0    Assessment:  Pleasant 72 year old male, resides with spouse (present during home visit).  RN CM  Following pt for transition of care/recent hospitalization January 11-15,2019 for chest pain, NON STMI, left heart cath done 03/30/17.  Referral received on pt 03/31/17 from [place] RN [facility] hospital  Liaison.  Pt's history includes but  not limited to CAD, HTN, Hyperlipidemia, DM.     CAD:  No complaints of chest pain, O 2 sat at rest 97%.  HR 52, no complaints of dizziness.              Per pt little sob ambulating- diminished lung sounds anterior left upper/lower lobes.    Hypertension: BP today 116/50, no complaints of dizziness.  Reinforced importance of hydration               As spouse reports pt not drinking enough.   DM: per pt reports sugar today 197, been running under 200.  Per Spouse, pt been using her          NPH - taking 35 units bid due to not being able to afford Lantus.    Plan:  As discussed with pt and spouse, plan to continue to follow for transition of care, follow  Up again next week telephonically.            Plan to collaborate with [facility] pharmacist as she has a visit scheduled with pt- 04/07/17.              Plan to send PCP home visit encounter.     [facility] CM Care Plan Problem One     Most Recent Value  Care Plan Problem One  Risk for readmission related to recent hospitalization for chest pain, NON ST MI, left heart cath done 1/14.   Role Documenting the Problem One  Care Management Coordinator  Care Plan for Problem One  Active  [facility] Long Term Goal   Pt will not readmit to the hospital within the next 31 days   [facility] Long Term Goal Start Date  04/01/17  Interventions for Problem One Long Term Goal  Initial home visit done- provided/reviewed wiith pt Emmi Heart Attack recovery- signs/symptoms to report.   [facility] CM Short Term Goal #1   Pt will take all medications as ordered for the next 30 days   [facility] CM Short Term Goal #1 Start Date  04/01/17  Interventions for Short Term Goal #1  Medication review completed.   [facility] CM Short Term Goal #2   Pt would keep all MD follow up appointments in the next 30 days   [facility] CM Short Term Goal #2 Start Date  04/01/17  Interventions for Short Term Goal #2  Reinforced with pt keeping appointment with new PCP 1/25.      Benjamin Chan.   Pierzchala RN CCM [facility] Care  Management  [phone]

## 2017-04-07 ENCOUNTER — Other Ambulatory Visit: Payer: Self-pay | Admitting: Pharmacy Technician

## 2017-04-07 ENCOUNTER — Other Ambulatory Visit: Payer: Self-pay | Admitting: Pharmacist

## 2017-04-07 NOTE — Patient Outreach (Signed)
[facility]) Care Management  04/07/2017  PERL KLAUSER Nov 04, 1945 [id number]   Successful outreach call to Mrs. Lindenbaum. HIPAA identifiers verified for [facility]. Contacted in reference to [employer] patient assistance application. Mrs. Litsey was able to provide me with patients Medicare ID number.  [name] [name] [facility] Care Management [phone]

## 2017-04-07 NOTE — Patient Outreach (Addendum)
[facility]) Care Management  [facility]   04/07/2017  Benjamin Chan [birth year] [id number]  72 year old male referred to [facility] Care Management for transition of care services following recent hospitalization.  [facility] services requested for medication assistance and medication reconciliation.  PMHx includes, but not limited to, CAD s/p prior CABG, chronic kidney disease stage III, COPD, diabetes mellitus requiring insulin, HTN, BPH and history of medication noncompliance.  Patient recently stopped taking medications for the last month and was hospitalized 1/11 - 03/31/17 for NSTEMI s/p angioplasty and stent and bronchitis, discharged home on ticagrelor, aspirin, and azithromycin.  Successful home visit with Benjamin Chan, spouse, and Chemical engineer for medication reconciliation after hospitalization and for medication assistance.  HIPAA identifiers verified.    Subjective:  "I'm using my wife's insulin."   "I feel tired all the time."  Patient reports that he has felt better since discharge however complains of being tired and not having energy. He is using a pillbox that his daughter fills for him weekly.  He reports that his daughter bought his new medications at discharge.  He states that he cannot afford the $45 co-pays for his inhaler, insulin, and ticagrelor. He reports that he drastically changed his diet since discharge and stopped drinking all soda.  He reports his CBGs have been low 100s since discharge. Patient reports he is going to see Dr. Dossie Chan for initial visit this Friday, April 10, 2017.    Objective:  Vitals during visit:  Blood pressure: 136/66 Heart Rate: 48  Labs:  SCr 1.45 (03/31/2017), CrCl 59 ml/min  Medications Reviewed Today    Reviewed by Della Goo, RN (Registered Nurse) on 04/06/17 at 1359  Med List Status: <None>  Medication Order Taking? Sig Documenting Provider Last Dose Status Informant  acetaminophen  (TYLENOL) 325 MG tablet [id number] Yes Take 650 mg by mouth every 6 (six) hours as needed. [provider] Taking Active Self  aspirin 81 MG tablet [id number] Yes Take 81 mg by mouth daily. [provider] Taking Active Self           Med Note Floyce Stakes, MORGAN A   Fri Jun 29, 2015 11:13 PM)    atorvastatin (LIPITOR) 40 MG tablet [id number] Yes Take 1 tablet (40 mg total) by mouth daily. Auburn Bilberry, MD Taking Active   azithromycin [facility]) 250 MG tablet [id number] No One tab daily 2 more days  Patient not taking:  Reported on 04/06/2017   Auburn Bilberry, MD Not Taking Active   doxazosin (CARDURA) 2 MG tablet [id number] Yes Take 2 mg by mouth daily. [provider] Taking Active Self  enalapril (VASOTEC) 20 MG tablet [id number] Yes Take 1 tablet (20 mg total) by mouth daily. Auburn Bilberry, MD Taking Active   finasteride (PROSCAR) 5 MG tablet [id number] Yes Take 1 tablet by mouth daily. [provider] Taking Active Self           Med Note Floyce Stakes, MORGAN A   Fri Jun 29, 2015 11:13 PM)    fluticasone (FLONASE) 50 MCG/ACT nasal spray [id number] Yes Place into both nostrils daily. Pt takes twice a day [provider] Taking Active   guaiFENesin-dextromethorphan (ROBITUSSIN DM) 100-10 MG/5ML syrup [id number] Yes Take 5 mLs by mouth every 4 (four) hours as needed for cough. Auburn Bilberry, MD Taking Active   insulin glargine (LANTUS) 100 UNIT/ML injection [id number] No Inject 0.55 mLs (55 Units total) into the skin at bedtime.  Patient not taking:  Reported on 04/06/2017   Auburn Bilberry, MD Not Taking Active   insulin NPH Human (HUMULIN N,NOVOLIN N) 100 UNIT/ML injection [id number] Yes Inject into the skin. 35 units twice a day. [provider] Taking Active   metoprolol tartrate (LOPRESSOR) 25 MG tablet [id number] Yes Take 1 tablet (25 mg total) by mouth 2 (two) times daily. Auburn Bilberry, MD Taking Active   nitroGLYCERIN (NITROSTAT) 0.4 MG SL  tablet [id number] No Place 0.4 mg under the tongue. [provider] Not Taking Active Self           Med Note Floyce Stakes, [place] A   Fri Jun 29, 2015 11:13 PM)    ticagrelor (BRILINTA) 90 MG TABS tablet [id number] Yes Take 1 tablet (90 mg total) by mouth 2 (two) times daily. Auburn Bilberry, MD Taking Active   tiotropium [facility]) 18 MCG inhalation capsule [id number] No Place 18 mcg into inhaler and inhale daily. [provider] Not Taking Active Self            Functional Status: In your present state of health, do you have any difficulty performing the following activities: 04/06/2017 03/27/2017  Hearing? N N  Vision? N N  Difficulty concentrating or making decisions? N N  Walking or climbing stairs? N N  Dressing or bathing? N N  Doing errands, shopping? N N  Preparing Food and eating ? N -  Using the Toilet? N -  In the past six months, have you accidently leaked urine? N -  Do you have problems with loss of bowel control? N -  Managing your Medications? Y -  Managing your Finances? Y -  Housekeeping or managing your Housekeeping? N -  Some recent data might be hidden    Fall/Depression Screening: Fall Risk  04/06/2017  Falls in the past year? No   PHQ 2/9 Scores 04/06/2017  PHQ - 2 Score 0    Assessment: Date Discharged from Hospital: 03/31/2017 Date Medication Reconciliation Performed: 04/08/17  Patient was recently discharged from hospital and all medications have been reviewed.  Medications Discontinued at Discharge:  Chlorthalidone  New Medications at Discharge:   Azithromycin  Ticagrelor  Guaifenesin  Guaifenesin-dextromethorphan  Continued Medications at Discharge with changes:  Metoprolol tartrate  Insulin glargine  Drugs sorted by system:  Neurologic/Psychologic: none  Cardiovascular: aspirin 81mg , ticagrelor, atorvastatin, enalapril, metoprolol, PRN NTG SL  Pulmonary/Allergy: fluticasone NS, guaifenesin,  guaifenesin-dextromethorphan, tiotropium  Gastrointestinal: none  Endocrine: insulin glargine  Renal: none  Topical: none  Pain: acetaminophen  Vitamins/Minerals: none  Infectious Diseases: azithromycin  Miscellaneous:doxazosin, finasteride  Renal dosing: no issues Gaps in therapy: no issues Duplications in therapy:  Patient taking both metoprolol tartrate and metoprolol succinate since discharge. Patient with bradycardia, fatigue.    Appears medication changed to metoprolol tartrate during hospitalization but patient not aware to discontinue metoprolol succinate.  I refilled pillbox and removed all metoprolol succinate as well as metoprolol tartrate for this evening.   Care coordination call placed to [facility] where both medications were filled.  Pharmacist de-activated metoprolol succinate prescription.   Medications to avoid in the elderly: no issues Drug interactions: no issues Other issues noted:   Patient only has 2 days left of doxazosin and finasteride.  Refills requested at pharmacy for a 30 day supply.  Patient will pick up this week.     Patient not using Lantus insulin.  He is using his wife's Novolin N and reports much improved CBGs with diet changes.  He may benefit from reduction in  insulin dose (currently using 35 units BID).   Medication Assistance:  Patient unable to afford insulin, ticagrelor, and tiotropium.    1. Patient preliminarily qualifies for Extra Help / Low Income Subsidy.  I assisted patient with applying for program through online website.  I reviewed with patient that this takes 4-6 weeks (or longer with government shutdown) for decision and a letter will be mailed to his home address with approval or denial.  Patient is aware to watch for this mail.    2: Patient assistance programs:   Tiotropium: If Extra Help denied, patient may be able to receive tiotropium through PAP at no charge.  Financial documents obtained for application  and application was submitted to BI.  This will take 1-2 weeks for decision.    Lantus: If Extra Help denied, patient may benefit from changing from Lantus to NPH insulin.  [facility] sells NPH vials (Relion) for $25/vial.  Patient not eligible for PAP for Lantus until he has spent out of pocket 5% annual income on medications.   Ticagrelor: If Extra Help denied, patient can apply for PAP after he has spent out of pocket 3% annual income on medications.   3: Other cost-savings methods to help with co-pays for insulin, ticagrelor, and tiotropium:  [employer] offers Tier 1 and Tier 2 medications through mail order pharmacy at no charge.  These include metoprolol, atorvastatin, doxazosin, enalapril, and finasteride.  Patient agreeable to transfer prescriptions to mail order pharmacy for cost-savings.   We reviewed [employer] OTC program with patient that could also offer cost-savings on OTC medications and supplies.  I printed a catalogue for patient to use and reference.  Patient voiced understanding on how to call for ordering medications.     Plan: I will route my note to Dr. Dossie Chan regarding medication issues as noted above with metoprolol, insulin, and trouble affording medications.  I will follow-up with [employer] tomorrow to assist with transferring Tier 1 and 2 medications to mail order.  I will follow-up with Mr. Ruegg tomorrow regarding heart rate after stopping duplicate metoprolol to ensure it has improved.  I will work with Lilla Shook, [facility] pharmacy technician, to follow-up on PAP application for tiotropium and update patient accordingly.  I will schedule a follow-up with Mr. Abshier in 5 weeks to check on Extra Help / LIS application.   [name], PharmD, [facility] Clinical Pharmacist [facility] [employer] [phone]

## 2017-04-08 ENCOUNTER — Other Ambulatory Visit: Payer: Self-pay

## 2017-04-08 NOTE — Patient Outreach (Signed)
[facility]) Care Management  [facility]   04/08/2017  Benjamin Chan June 24, 1945 [id number]  Successful outreach call to patient. HIPAA identifiers verified. Called to ask about blood pressure today and heart rate due to duplicate medication error of patient taking both metoprolol tartrate AND metoprolol succinate resulting in a low heart rate of 48 as seen at home visit on 04/07/2017.  Today, 04/08/2017, patient reports "feeling a lot better" and checked blood pressure and heart rate while on the phone. Patient reports readings are: blood pressure 124/57 and heart rate 64.   A three-way call was completed with patients insurance, [employer], to enroll patient in mail order pharmacy and confirm the $0 copay for the following medications: doxazosin, finasteride, enalapril, atorvastatin and metoprolol tartrate.   [employer] representative confirmed that Dr. Jodi Geralds office would be receiving documentation to follow-up on via fax within the next 48 hours and following the doctors reply the patient should receive his medication via mail order within 7-10 business days. Patient was also notified to purchase the medications he would be out of in the next few days: finasteride with a $2 copay and doxazosin with a $8 copay each for a 30 day supply until medication is received via mail.    Call placed to Dr. Christell Faith office to notify them of a fax arrival from [facility] mail order pharmacy for Mr. Forrest. It was also confirmed that patients duplicate medication error was noted for discussion when patient has office visit this Friday.   Plan:  [facility] will follow up with patient next week  Karna Dupes  PharmD Candidate '19

## 2017-04-10 ENCOUNTER — Ambulatory Visit (INDEPENDENT_AMBULATORY_CARE_PROVIDER_SITE_OTHER): Payer: Medicare HMO | Admitting: Family Medicine

## 2017-04-10 ENCOUNTER — Encounter: Payer: Self-pay | Admitting: Family Medicine

## 2017-04-10 VITALS — BP 122/70 | HR 49 | Temp 98.6°F | Ht 70.0 in | Wt 221.0 lb

## 2017-04-10 DIAGNOSIS — Z7689 Persons encountering health services in other specified circumstances: Secondary | ICD-10-CM

## 2017-04-10 DIAGNOSIS — I252 Old myocardial infarction: Secondary | ICD-10-CM | POA: Diagnosis not present

## 2017-04-10 DIAGNOSIS — E119 Type 2 diabetes mellitus without complications: Secondary | ICD-10-CM | POA: Diagnosis not present

## 2017-04-10 DIAGNOSIS — I214 Non-ST elevation (NSTEMI) myocardial infarction: Secondary | ICD-10-CM | POA: Diagnosis not present

## 2017-04-10 DIAGNOSIS — J449 Chronic obstructive pulmonary disease, unspecified: Secondary | ICD-10-CM

## 2017-04-10 DIAGNOSIS — I10 Essential (primary) hypertension: Secondary | ICD-10-CM

## 2017-04-10 DIAGNOSIS — N400 Enlarged prostate without lower urinary tract symptoms: Secondary | ICD-10-CM | POA: Diagnosis not present

## 2017-04-10 DIAGNOSIS — E1129 Type 2 diabetes mellitus with other diabetic kidney complication: Secondary | ICD-10-CM | POA: Diagnosis not present

## 2017-04-10 DIAGNOSIS — E1165 Type 2 diabetes mellitus with hyperglycemia: Secondary | ICD-10-CM | POA: Diagnosis not present

## 2017-04-10 MED ORDER — BUDESONIDE-FORMOTEROL FUMARATE 160-4.5 MCG/ACT IN AERO: 2.0000 | INHALATION_SPRAY | Freq: Two times a day (BID) | RESPIRATORY_TRACT | 3 refills | Status: DC

## 2017-04-10 MED ORDER — TIOTROPIUM BROMIDE MONOHYDRATE 18 MCG IN CAPS: 18.0000 ug | ORAL_CAPSULE | Freq: Every day | RESPIRATORY_TRACT | 11 refills | Status: DC

## 2017-04-10 MED ORDER — ALBUTEROL SULFATE HFA 108 (90 BASE) MCG/ACT IN AERS: 2.0000 | INHALATION_SPRAY | Freq: Four times a day (QID) | RESPIRATORY_TRACT | 3 refills | Status: DC | PRN

## 2017-04-10 NOTE — Progress Notes (Signed)
BP 122/70   Pulse (!) 49   Temp 98.6 F (37 C)   Ht 5\' 10"  (1.778 m)   Wt 221 lb (100.2 kg)   SpO2 97%   BMI 31.71 kg/m    Subjective:    Patient ID: Benjamin Chan, male    DOB: 06/15/1945, 72 y.o.   MRN: [id number]  HPI: Benjamin Chan is a 72 y.o. male  Chief Complaint  Patient presents with  . hospital follow up   Pt here today to establish care. He's also a hospital f/u for NSTEMI and bronchitis, discharged 03/31/17.   Dry, hacking cough persists from bronchitis in hospital. States his breathing is slightly below baseline currently. No DOE, fevers, continued CP. Taking spiriva for his COPD.   Following up with Cardiology next week. Was placed on aspirin, nitro prn, brilinta, lipitor, and metoprolol during hospitalization. Doing fairly well with these other than persistent bradycardia since being back home. Having some dizzy spells and fatigue with this. Pulse currently high 40s taking 25 mg BID.   BSs at home typically in the 120s. Taking 35 units of NPH twice daily. A1C was 12.2 in hospital on admission but pt reports poor compliance to medication and diet prior to this admission. Has made drastic changes with his diet and lifestyle and has been compliant with medications since. Not wanting to add any medicines right now.   Past Medical History:  Diagnosis Date  . BPH (benign prostatic hyperplasia)   . CAD (coronary artery disease)   . Chronic kidney disease   . COPD (chronic obstructive pulmonary disease) (HCC)   . Diabetes mellitus without complication (HCC)   . Hypertension   . MI (myocardial infarction) (HCC) 03/2017   Social History   Socioeconomic History  . Marital status: Married    Spouse name: Not on file  . Number of children: Not on file  . Years of education: Not on file  . Highest education level: Not on file  Social Needs  . Financial resource strain: Not on file  . Food insecurity - worry: Not on file  . Food insecurity - inability: Not  on file  . Transportation needs - medical: Not on file  . Transportation needs - non-medical: Not on file  Occupational History  . Occupation: retired  Tobacco Use  . Smoking status: Former Smoker    Years: 40.00  . Smokeless tobacco: Never Used  Substance and Sexual Activity  . Alcohol use: No  . Drug use: No  . Sexual activity: Not on file  Other Topics Concern  . Not on file  Social History Narrative  . Not on file   Relevant past medical, surgical, family and social history reviewed and updated as indicated. Interim medical history since our last visit reviewed. Allergies and medications reviewed and updated.  Review of Systems  Constitutional: Positive for fatigue.  HENT: Negative.   Eyes: Negative.   Respiratory: Positive for cough and chest tightness.   Cardiovascular: Negative.   Gastrointestinal: Negative.   Musculoskeletal: Negative.   Neurological: Positive for dizziness and light-headedness.  Hematological: Does not bruise/bleed easily.  Psychiatric/Behavioral: Negative.    Per HPI unless specifically indicated above     Objective:    BP 122/70   Pulse (!) 49   Temp 98.6 F (37 C)   Ht 5\' 10"  (1.778 m)   Wt 221 lb (100.2 kg)   SpO2 97%   BMI 31.71 kg/m   Wt Readings from Last 3  Encounters:  04/10/17 221 lb (100.2 kg)  04/06/17 206 lb (93.4 kg)  03/30/17 203 lb (92.1 kg)    Physical Exam  Constitutional: He is oriented to person, place, and time. He appears well-developed and well-nourished. No distress.  HENT:  Head: Atraumatic.  Nose: Nose normal.  Mouth/Throat: Oropharynx is clear and moist. No oropharyngeal exudate.  Neck: Normal range of motion. Neck supple.  Cardiovascular: Normal heart sounds and intact distal pulses.  Bradycardic rate  Pulmonary/Chest: Effort normal. No respiratory distress. He has wheezes (mild).  Musculoskeletal: Normal range of motion.  Neurological: He is alert and oriented to person, place, and time.  Skin: Skin  is warm and dry.  Psychiatric: He has a normal mood and affect. His behavior is normal.  Nursing note and vitals reviewed.  Results for orders placed or performed during the hospital encounter of 03/27/17  MRSA PCR Screening  Result Value Ref Range   MRSA by PCR NEGATIVE NEGATIVE  Basic metabolic panel  Result Value Ref Range   Sodium 130 (L) 135 - 145 mmol/L   Potassium 4.6 3.5 - 5.1 mmol/L   Chloride 97 (L) 101 - 111 mmol/L   CO2 21 (L) 22 - 32 mmol/L   Glucose, Bld 440 (H) 65 - 99 mg/dL   BUN 29 (H) 6 - 20 mg/dL   Creatinine, Ser 3.08 (H) 0.61 - 1.24 mg/dL   Calcium 8.9 8.9 - 65.7 mg/dL   GFR calc non Af Amer 37 (L) >60 mL/min   GFR calc Af Amer 42 (L) >60 mL/min   Anion gap 12 5 - 15  CBC  Result Value Ref Range   WBC 12.5 (H) 3.8 - 10.6 K/uL   RBC 4.90 4.40 - 5.90 MIL/uL   Hemoglobin 14.6 13.0 - 18.0 g/dL   HCT 84.6 96.2 - 95.2 %   MCV 88.5 80.0 - 100.0 fL   MCH 29.8 26.0 - 34.0 pg   MCHC 33.6 32.0 - 36.0 g/dL   RDW 84.1 32.4 - 40.1 %   Platelets 296 150 - 440 K/uL  Troponin I  Result Value Ref Range   Troponin I 0.09 (HH) <0.03 ng/mL  Hemoglobin A1c  Result Value Ref Range   Hgb A1c MFr Bld 12.2 (H) 4.8 - 5.6 %   Mean Plasma Glucose 303.44 mg/dL  Lipid panel  Result Value Ref Range   Cholesterol 218 (H) 0 - 200 mg/dL   Triglycerides 027 (H) <150 mg/dL   HDL 35 (L) >25 mg/dL   Total CHOL/HDL Ratio 6.2 RATIO   VLDL 60 (H) 0 - 40 mg/dL   LDL Cholesterol 366 (H) 0 - 99 mg/dL  Troponin I  Result Value Ref Range   Troponin I 0.11 (HH) <0.03 ng/mL  Troponin I  Result Value Ref Range   Troponin I 0.25 (HH) <0.03 ng/mL  Influenza panel by PCR (type A & B)  Result Value Ref Range   Influenza A By PCR NEGATIVE NEGATIVE   Influenza B By PCR NEGATIVE NEGATIVE  Troponin I  Result Value Ref Range   Troponin I 0.10 (HH) <0.03 ng/mL  Glucose, capillary  Result Value Ref Range   Glucose-Capillary 408 (H) 65 - 99 mg/dL  Basic metabolic panel  Result Value Ref Range     Sodium 134 (L) 135 - 145 mmol/L   Potassium 3.8 3.5 - 5.1 mmol/L   Chloride 101 101 - 111 mmol/L   CO2 26 22 - 32 mmol/L   Glucose, Bld 282 (H)  65 - 99 mg/dL   BUN 30 (H) 6 - 20 mg/dL   Creatinine, Ser 1.61 (H) 0.61 - 1.24 mg/dL   Calcium 8.5 (L) 8.9 - 10.3 mg/dL   GFR calc non Af Amer 43 (L) >60 mL/min   GFR calc Af Amer 50 (L) >60 mL/min   Anion gap 7 5 - 15  CBC  Result Value Ref Range   WBC 11.1 (H) 3.8 - 10.6 K/uL   RBC 4.59 4.40 - 5.90 MIL/uL   Hemoglobin 13.4 13.0 - 18.0 g/dL   HCT 09.6 04.5 - 40.9 %   MCV 89.8 80.0 - 100.0 fL   MCH 29.1 26.0 - 34.0 pg   MCHC 32.5 32.0 - 36.0 g/dL   RDW 81.1 91.4 - 78.2 %   Platelets 274 150 - 440 K/uL  Creatinine, serum  Result Value Ref Range   Creatinine, Ser 1.86 (H) 0.61 - 1.24 mg/dL   GFR calc non Af Amer 34 (L) >60 mL/min   GFR calc Af Amer 40 (L) >60 mL/min  Glucose, capillary  Result Value Ref Range   Glucose-Capillary 477 (H) 65 - 99 mg/dL  Glucose, capillary  Result Value Ref Range   Glucose-Capillary 407 (H) 65 - 99 mg/dL  Glucose, capillary  Result Value Ref Range   Glucose-Capillary 394 (H) 65 - 99 mg/dL  Troponin I  Result Value Ref Range   Troponin I 0.99 (HH) <0.03 ng/mL  Troponin I  Result Value Ref Range   Troponin I 1.19 (HH) <0.03 ng/mL  APTT  Result Value Ref Range   aPTT 31 24 - 36 seconds  Protime-INR  Result Value Ref Range   Prothrombin Time 12.8 11.4 - 15.2 seconds   INR 0.97   Troponin I  Result Value Ref Range   Troponin I 1.09 (HH) <0.03 ng/mL  Heparin level (unfractionated)  Result Value Ref Range   Heparin Unfractionated 0.54 0.30 - 0.70 IU/mL  Glucose, capillary  Result Value Ref Range   Glucose-Capillary 289 (H) 65 - 99 mg/dL   Comment 1 Notify RN   Heparin level (unfractionated)  Result Value Ref Range   Heparin Unfractionated 0.35 0.30 - 0.70 IU/mL  Glucose, capillary  Result Value Ref Range   Glucose-Capillary 384 (H) 65 - 99 mg/dL   Comment 1 Notify RN   Glucose,  capillary  Result Value Ref Range   Glucose-Capillary 374 (H) 65 - 99 mg/dL   Comment 1 Notify RN   CBC  Result Value Ref Range   WBC 9.7 3.8 - 10.6 K/uL   RBC 4.50 4.40 - 5.90 MIL/uL   Hemoglobin 13.2 13.0 - 18.0 g/dL   HCT 95.6 21.3 - 08.6 %   MCV 89.0 80.0 - 100.0 fL   MCH 29.5 26.0 - 34.0 pg   MCHC 33.1 32.0 - 36.0 g/dL   RDW 57.8 46.9 - 62.9 %   Platelets 270 150 - 440 K/uL  Lipid panel  Result Value Ref Range   Cholesterol 195 0 - 200 mg/dL   Triglycerides 528 <413 mg/dL   HDL 42 >24 mg/dL   Total CHOL/HDL Ratio 4.6 RATIO   VLDL 22 0 - 40 mg/dL   LDL Cholesterol 401 (H) 0 - 99 mg/dL  Heparin level (unfractionated)  Result Value Ref Range   Heparin Unfractionated 0.34 0.30 - 0.70 IU/mL  Glucose, capillary  Result Value Ref Range   Glucose-Capillary 298 (H) 65 - 99 mg/dL  Heparin level (unfractionated)  Result Value Ref Range  Heparin Unfractionated 0.31 0.30 - 0.70 IU/mL  Glucose, capillary  Result Value Ref Range   Glucose-Capillary 219 (H) 65 - 99 mg/dL   Comment 1 Notify RN   Heparin level (unfractionated)  Result Value Ref Range   Heparin Unfractionated 0.37 0.30 - 0.70 IU/mL  Glucose, capillary  Result Value Ref Range   Glucose-Capillary 327 (H) 65 - 99 mg/dL   Comment 1 Notify RN   Glucose, capillary  Result Value Ref Range   Glucose-Capillary 236 (H) 65 - 99 mg/dL   Comment 1 Notify RN   CBC  Result Value Ref Range   WBC 9.8 3.8 - 10.6 K/uL   RBC 4.34 (L) 4.40 - 5.90 MIL/uL   Hemoglobin 12.8 (L) 13.0 - 18.0 g/dL   HCT 54.0 (L) 98.1 - 19.1 %   MCV 89.9 80.0 - 100.0 fL   MCH 29.6 26.0 - 34.0 pg   MCHC 32.9 32.0 - 36.0 g/dL   RDW 47.8 29.5 - 62.1 %   Platelets 283 150 - 440 K/uL  Basic metabolic panel  Result Value Ref Range   Sodium 134 (L) 135 - 145 mmol/L   Potassium 4.2 3.5 - 5.1 mmol/L   Chloride 100 (L) 101 - 111 mmol/L   CO2 24 22 - 32 mmol/L   Glucose, Bld 245 (H) 65 - 99 mg/dL   BUN 33 (H) 6 - 20 mg/dL   Creatinine, Ser 3.08 (H)  0.61 - 1.24 mg/dL   Calcium 8.4 (L) 8.9 - 10.3 mg/dL   GFR calc non Af Amer 38 (L) >60 mL/min   GFR calc Af Amer 44 (L) >60 mL/min   Anion gap 10 5 - 15  Heparin level (unfractionated)  Result Value Ref Range   Heparin Unfractionated 0.31 0.30 - 0.70 IU/mL  Glucose, capillary  Result Value Ref Range   Glucose-Capillary 281 (H) 65 - 99 mg/dL   Comment 1 Notify RN    Comment 2 Document in Chart   Glucose, capillary  Result Value Ref Range   Glucose-Capillary 252 (H) 65 - 99 mg/dL  Glucose, capillary  Result Value Ref Range   Glucose-Capillary 213 (H) 65 - 99 mg/dL  Glucose, capillary  Result Value Ref Range   Glucose-Capillary 254 (H) 65 - 99 mg/dL  Glucose, capillary  Result Value Ref Range   Glucose-Capillary 374 (H) 65 - 99 mg/dL  Basic metabolic panel  Result Value Ref Range   Sodium 137 135 - 145 mmol/L   Potassium 4.0 3.5 - 5.1 mmol/L   Chloride 105 101 - 111 mmol/L   CO2 24 22 - 32 mmol/L   Glucose, Bld 133 (H) 65 - 99 mg/dL   BUN 26 (H) 6 - 20 mg/dL   Creatinine, Ser 6.57 (H) 0.61 - 1.24 mg/dL   Calcium 8.5 (L) 8.9 - 10.3 mg/dL   GFR calc non Af Amer 46 (L) >60 mL/min   GFR calc Af Amer 54 (L) >60 mL/min   Anion gap 8 5 - 15  Glucose, capillary  Result Value Ref Range   Glucose-Capillary 219 (H) 65 - 99 mg/dL  Glucose, capillary  Result Value Ref Range   Glucose-Capillary 101 (H) 65 - 99 mg/dL  Glucose, capillary  Result Value Ref Range   Glucose-Capillary 134 (H) 65 - 99 mg/dL  Glucose, capillary  Result Value Ref Range   Glucose-Capillary 209 (H) 65 - 99 mg/dL  POCT Activated clotting time  Result Value Ref Range   Activated Clotting Time 313  seconds      Assessment & Plan:   Problem List Items Addressed This Visit      Cardiovascular and Mediastinum   Essential hypertension    Trial of 12.5 metoprolol BID (cut tabs in half), continue home monitoring of BP and HR. If still low, d/c altogether until Cardiology appt next week given his dizziness and  fatigue with bradycardia. Continue current regimen otherwise as before.         Respiratory   COPD (chronic obstructive pulmonary disease) (HCC) - Primary    Pt assistance forms for spiriva filled out, 2 symbicort samples given until pt can pick up rx to help with persistent dry cough and lingering wheezes. Start mucinex BID prn.       Relevant Medications   budesonide-formoterol (SYMBICORT) 160-4.5 MCG/ACT inhaler   albuterol (PROVENTIL HFA;VENTOLIN HFA) 108 (90 Base) MCG/ACT inhaler   tiotropium (SPIRIVA) 18 MCG inhalation capsule     Endocrine   Diabetes mellitus without complication (HCC)    Continue NPH at current dose, along with strict dietary modifications. Home BS's much improved since D/c. Will see what next A1C looks like given pt's motivation to stay off further medications. Will adjust as needed at that time.         Genitourinary   BPH (benign prostatic hyperplasia)     Other   History of myocardial infarction    Doing well with brilinta, aspirin, and lipitor. Has f/u with Cardiology next week. Has not needed any nitro tabs since d/c. Continue current regimen       Other Visit Diagnoses    Encounter to establish care           Follow up plan: Return in about 3 months (around 07/09/2017) for AWV with Tiffany, CPE with me.

## 2017-04-10 NOTE — Patient Instructions (Addendum)
Take 1/2 tab of the metoprolol 25 mg twice daily until you see Cardiology on Tuesday. If pulse is still running low, can stop the medicine altogether until seeing Cardiology.   Delsym and plain mucinex as well as inhaler

## 2017-04-13 ENCOUNTER — Other Ambulatory Visit: Payer: Self-pay

## 2017-04-13 DIAGNOSIS — N400 Enlarged prostate without lower urinary tract symptoms: Secondary | ICD-10-CM | POA: Insufficient documentation

## 2017-04-13 DIAGNOSIS — E119 Type 2 diabetes mellitus without complications: Secondary | ICD-10-CM | POA: Insufficient documentation

## 2017-04-13 DIAGNOSIS — E1129 Type 2 diabetes mellitus with other diabetic kidney complication: Secondary | ICD-10-CM | POA: Insufficient documentation

## 2017-04-13 DIAGNOSIS — E1165 Type 2 diabetes mellitus with hyperglycemia: Secondary | ICD-10-CM

## 2017-04-13 DIAGNOSIS — I129 Hypertensive chronic kidney disease with stage 1 through stage 4 chronic kidney disease, or unspecified chronic kidney disease: Secondary | ICD-10-CM | POA: Insufficient documentation

## 2017-04-13 DIAGNOSIS — E1122 Type 2 diabetes mellitus with diabetic chronic kidney disease: Secondary | ICD-10-CM | POA: Insufficient documentation

## 2017-04-13 DIAGNOSIS — J449 Chronic obstructive pulmonary disease, unspecified: Secondary | ICD-10-CM | POA: Insufficient documentation

## 2017-04-13 DIAGNOSIS — I252 Old myocardial infarction: Secondary | ICD-10-CM | POA: Insufficient documentation

## 2017-04-13 MED ORDER — ATORVASTATIN CALCIUM 40 MG PO TABS: 40.0000 mg | ORAL_TABLET | Freq: Every day | ORAL | 4 refills | Status: DC

## 2017-04-13 MED ORDER — FINASTERIDE 5 MG PO TABS: 5.0000 mg | ORAL_TABLET | Freq: Every day | ORAL | 4 refills | Status: DC

## 2017-04-13 MED ORDER — DOXAZOSIN MESYLATE 2 MG PO TABS: 2.0000 mg | ORAL_TABLET | Freq: Every day | ORAL | 4 refills | Status: DC

## 2017-04-13 NOTE — Assessment & Plan Note (Signed)
Pt assistance forms for spiriva filled out, 2 symbicort samples given until pt can pick up rx to help with persistent dry cough and lingering wheezes. Start mucinex BID prn.

## 2017-04-13 NOTE — Assessment & Plan Note (Signed)
Trial of 12.5 metoprolol BID (cut tabs in half), continue home monitoring of BP and HR. If still low, d/c altogether until Cardiology appt next week given his dizziness and fatigue with bradycardia. Continue current regimen otherwise as before.

## 2017-04-13 NOTE — Telephone Encounter (Signed)
Please note atorvastatin was started by hospitalist, and doxazosin and finasteride have not been written by provider in this office before.

## 2017-04-13 NOTE — Telephone Encounter (Signed)
rx

## 2017-04-13 NOTE — Assessment & Plan Note (Signed)
Doing well with brilinta, aspirin, and lipitor. Has f/u with Cardiology next week. Has not needed any nitro tabs since d/c. Continue current regimen

## 2017-04-13 NOTE — Assessment & Plan Note (Signed)
Continue NPH at current dose, along with strict dietary modifications. Home BS's much improved since D/c. Will see what next A1C looks like given pt's motivation to stay off further medications. Will adjust as needed at that time.

## 2017-04-14 ENCOUNTER — Other Ambulatory Visit: Payer: Self-pay | Admitting: Family Medicine

## 2017-04-14 DIAGNOSIS — I10 Essential (primary) hypertension: Secondary | ICD-10-CM | POA: Diagnosis not present

## 2017-04-14 DIAGNOSIS — E782 Mixed hyperlipidemia: Secondary | ICD-10-CM | POA: Diagnosis not present

## 2017-04-14 DIAGNOSIS — I214 Non-ST elevation (NSTEMI) myocardial infarction: Secondary | ICD-10-CM

## 2017-04-14 DIAGNOSIS — I25810 Atherosclerosis of coronary artery bypass graft(s) without angina pectoris: Secondary | ICD-10-CM | POA: Diagnosis not present

## 2017-04-14 HISTORY — DX: Non-ST elevation (NSTEMI) myocardial infarction: I21.4

## 2017-04-14 MED ORDER — BD SWAB SINGLE USE REGULAR PADS: MEDICATED_PAD | 4 refills | Status: DC

## 2017-04-14 MED ORDER — GLUCOSE BLOOD VI STRP: ORAL_STRIP | 12 refills | Status: DC

## 2017-04-14 MED ORDER — ACCU-CHEK FASTCLIX LANCETS MISC: 4 refills | Status: DC

## 2017-04-14 MED ORDER — ACCU-CHEK NANO SMARTVIEW W/DEVICE KIT: PACK | 0 refills | Status: DC

## 2017-04-15 ENCOUNTER — Telehealth: Payer: Self-pay | Admitting: Family Medicine

## 2017-04-15 ENCOUNTER — Other Ambulatory Visit: Payer: Self-pay | Admitting: *Deleted

## 2017-04-15 ENCOUNTER — Telehealth: Payer: Self-pay | Admitting: Pharmacist

## 2017-04-15 DIAGNOSIS — E119 Type 2 diabetes mellitus without complications: Secondary | ICD-10-CM

## 2017-04-15 NOTE — Patient Outreach (Signed)
04/15/17  Transition of care call  Successful telephone encounter to Benjamin Chan, 72 year old male for transition of care/ongoing follow up on recent  Hospitalization January 11-15,2019 for chest pain, NON ST MI, left heart cath done 03/30/17.  Referral received 03/31/17 from [place] RN [facility] hospital liaison for Community CM services/transition of care.   Pt's history inclludes but not limited to  CAD, HTN, Hyperlipidemia, DM.   Spoke with pt, HIPAA identifiers verified.   Pt reports still weak when taking heart pill  (Metopropolol) to which RN CM  reviewed with pt recent PCP visit where Metoprolol was decreased from 25 mg to 12.5 mg  Bid.  Pt acknowledged medication decrease but reports having difficulty cutting the pill in half, so little, daughter  (fills pt's  pill planner) unable to do it with a knife, does not have a pill cutter.  RN CM discussed with pt importance of getting  A pill cutter,take medication as ordered  to which pt agreed to do.   Pt reports BP doing good, today 142/68, HR 62.    Pt reports taking  Muccinex (view in EMR ordered at PCP visit) bid, helping, not coughing as much  As well as doing  Spiriva (samples given).  Pt reports saw Heart MD yesterday, good report, talked about cardiac rehab.  Pt reports sugars  Have been doing good, today sugar was  95, continues to take NPH 35 units bid.     Plan:  As discussed with pt, to purchase a pill cutter for Metoprolol.              As discussed with pt, plan to follow up again next week telephonically- part of ongoing transition of care.  Shayne Alken.   [place] RN CCM [facility] Care Management  [phone]

## 2017-04-15 NOTE — Patient Outreach (Signed)
[facility]) Care Management  04/15/2017  Benjamin Chan [birth year] [id number]   Care coordination call placed to Dr. Christell Faith office for an update on medications requested to be sent to [facility] pharmacy.    Per representative, prescriptions sent to [facility] pharmacy instead of [facility].  Request will be sent to PA Roosvelt Maser to re-send prescriptions to [facility] for a 90 day supply with refills for atorvastatin, doxazosin, enalapril, finasteride, and metoprolol.    Requested prescription for Relion NPH be sent to [facility] pharmacy so patient does not have to continue to use his spouse's insulin.    Plan: I will await call-back from Dr. Christell Faith office for an update on issues above.   [name], PharmD, [facility] Clinical Pharmacist [facility] [phone]

## 2017-04-15 NOTE — Telephone Encounter (Signed)
Copied from CRM #[id number]. Topic: Quick Communication - See Telephone Encounter °>> Apr 15, 2017  5:11 PM Clack, Jessica D wrote: °CRM for notification. See Telephone encounter for:  °Mary with [facility] pharmacy states the Blood Glucose Monitoring Suppl (ACCU-CHEK NANO SMARTVIEW) w/Device KIT [[id number]] will be discontinued soon. And would like to know if the provider could call in accu chek aviva plus. ° °#[phone]  °04/15/17. °

## 2017-04-15 NOTE — Telephone Encounter (Signed)
Colleen from [facility] called requesting the following meds be sent to [facility] mail order phar   Metoprolol Atorvastatin Doxazosin enalpril  fiasteride  Also the patient states he has been taking his wifes insulin but if you can send in NPH rely brand to [address]  And if we have any symbicort samples for the patient until his extra help comes in.  [name] can be reached at [phone]  Thanks

## 2017-04-16 MED ORDER — ENALAPRIL MALEATE 20 MG PO TABS: 20.0000 mg | ORAL_TABLET | Freq: Every day | ORAL | 3 refills | Status: DC

## 2017-04-16 MED ORDER — DOXAZOSIN MESYLATE 2 MG PO TABS: 2.0000 mg | ORAL_TABLET | Freq: Every day | ORAL | 1 refills | Status: DC

## 2017-04-16 MED ORDER — METOPROLOL TARTRATE 25 MG PO TABS: 25.0000 mg | ORAL_TABLET | Freq: Two times a day (BID) | ORAL | 2 refills | Status: DC

## 2017-04-16 MED ORDER — FINASTERIDE 5 MG PO TABS: 5.0000 mg | ORAL_TABLET | Freq: Every day | ORAL | 3 refills | Status: DC

## 2017-04-16 MED ORDER — ATORVASTATIN CALCIUM 40 MG PO TABS: 40.0000 mg | ORAL_TABLET | Freq: Every day | ORAL | 1 refills | Status: DC

## 2017-04-16 MED ORDER — ACCU-CHEK AVIVA PLUS W/DEVICE KIT: 1.0000 | PACK | Freq: Once | 0 refills | Status: AC

## 2017-04-16 MED ORDER — GLUCOSE BLOOD VI STRP: ORAL_STRIP | 12 refills | Status: DC

## 2017-04-16 NOTE — Telephone Encounter (Signed)
RX sent to [facility].  

## 2017-04-16 NOTE — Telephone Encounter (Signed)
Rxs sent

## 2017-04-16 NOTE — Telephone Encounter (Signed)
ok 

## 2017-04-20 ENCOUNTER — Encounter: Payer: Self-pay | Admitting: Family Medicine

## 2017-04-20 ENCOUNTER — Ambulatory Visit: Payer: Self-pay | Admitting: *Deleted

## 2017-04-20 ENCOUNTER — Ambulatory Visit (INDEPENDENT_AMBULATORY_CARE_PROVIDER_SITE_OTHER): Payer: Medicare HMO | Admitting: Family Medicine

## 2017-04-20 VITALS — BP 150/62 | HR 56 | Temp 98.1°F | Wt 216.5 lb

## 2017-04-20 DIAGNOSIS — R001 Bradycardia, unspecified: Secondary | ICD-10-CM

## 2017-04-20 DIAGNOSIS — I10 Essential (primary) hypertension: Secondary | ICD-10-CM | POA: Diagnosis not present

## 2017-04-20 MED ORDER — AMLODIPINE BESYLATE 5 MG PO TABS: 5.0000 mg | ORAL_TABLET | Freq: Every day | ORAL | 0 refills | Status: DC

## 2017-04-20 NOTE — Telephone Encounter (Addendum)
Pt thinks he is having some side effects from his medication (metoprolol tartrate). He is feeling weak, nauseated and dizzy. His wife states that she feels like it is getting worse. His b/p is 144/60 and hr 55. He stated his blood sugar was over 200 this morning. Pt and wife counseled regarding his diet also.  Appointment made for today with Richrd Prime, PA Home care advice given to patient and wife with verbal understanding.  Reason for Disposition . Caller has URGENT medication question about med that PCP prescribed and triager unable to answer question  Answer Assessment - Initial Assessment Questions 1. SYMPTOMS: "Do you have any symptoms?"     Nauseated, weak and dizzy 2. SEVERITY: If symptoms are present, ask "Are they mild, moderate or severe?"     Getting severe daily  Protocols used: MEDICATION QUESTION CALL-A-AH

## 2017-04-20 NOTE — Progress Notes (Signed)
BP (!) 150/62 (BP Location: Right Arm, Patient Position: Sitting, Cuff Size: Normal)   Pulse (!) 56   Temp 98.1 F (36.7 C) (Oral)   Wt 216 lb 8 oz (98.2 kg)   SpO2 92%   BMI 31.06 kg/m    Subjective:    Patient ID: Benjamin Chan, male    DOB: [birth year], 72 y.o.   MRN: [id number]  HPI: Benjamin Chan is a 72 y.o. male  Chief Complaint  Patient presents with  . Medication Reaction    For metoprolol 25mg . Was started on 1 BID, but was titrated down to 1/2 BID. Patient still experiencing symptoms after taking medication such as weakness and nausea. Took last dose this morning.   Pt here for persistent bradycardia despite decreasing his metoprolol to 12.5 BID, then trying QD. Pulse has been staying in the 50s and low 60s. Still having fatigue and dizzy spells with these readings. BPs have been mildly elevated as well. Denies CP, SOB, HAs, syncope. Saw Cardiologist last week for MI f/u, was advised to keep decreased dose and f/u in 1 month for recheck.   Past Medical History:  Diagnosis Date  . BPH (benign prostatic hyperplasia)   . CAD (coronary artery disease)   . Chronic kidney disease   . COPD (chronic obstructive pulmonary disease) (HCC)   . Diabetes mellitus without complication (HCC)   . Hypertension   . MI (myocardial infarction) (HCC) 03/2017   Social History   Socioeconomic History  . Marital status: Married    Spouse name: Not on file  . Number of children: Not on file  . Years of education: Not on file  . Highest education level: Not on file  Social Needs  . Financial resource strain: Not on file  . Food insecurity - worry: Not on file  . Food insecurity - inability: Not on file  . Transportation needs - medical: Not on file  . Transportation needs - non-medical: Not on file  Occupational History  . Occupation: retired  Tobacco Use  . Smoking status: Former Smoker    Years: 40.00  . Smokeless tobacco: Never Used  Substance and Sexual Activity    . Alcohol use: No  . Drug use: No  . Sexual activity: Not on file  Other Topics Concern  . Not on file  Social History Narrative  . Not on file    Relevant past medical, surgical, family and social history reviewed and updated as indicated. Interim medical history since our last visit reviewed. Allergies and medications reviewed and updated.  Review of Systems  Constitutional: Positive for fatigue.  HENT: Negative.   Eyes: Negative.   Respiratory: Negative.   Cardiovascular: Negative.   Genitourinary: Negative.   Musculoskeletal: Negative.   Neurological: Positive for dizziness.  Psychiatric/Behavioral: Negative.    Per HPI unless specifically indicated above     Objective:    BP (!) 150/62 (BP Location: Right Arm, Patient Position: Sitting, Cuff Size: Normal)   Pulse (!) 56   Temp 98.1 F (36.7 C) (Oral)   Wt 216 lb 8 oz (98.2 kg)   SpO2 92%   BMI 31.06 kg/m   Wt Readings from Last 3 Encounters:  04/20/17 216 lb 8 oz (98.2 kg)  04/10/17 221 lb (100.2 kg)  04/06/17 206 lb (93.4 kg)    Physical Exam  Constitutional: He is oriented to person, place, and time. He appears well-developed and well-nourished. No distress.  HENT:  Head: Atraumatic.  Eyes: Conjunctivae  are normal. Pupils are equal, round, and reactive to light.  Neck: Normal range of motion. Neck supple.  Cardiovascular: Normal rate and normal heart sounds.  Pulmonary/Chest: Effort normal and breath sounds normal. No respiratory distress.  Musculoskeletal: Normal range of motion.  Neurological: He is alert and oriented to person, place, and time.  Skin: Skin is warm and dry.  Psychiatric: He has a normal mood and affect. His behavior is normal.  Nursing note and vitals reviewed.  Results for orders placed or performed during the hospital encounter of 03/27/17  MRSA PCR Screening  Result Value Ref Range   MRSA by PCR NEGATIVE NEGATIVE  Basic metabolic panel  Result Value Ref Range   Sodium 130 (L)  135 - 145 mmol/L   Potassium 4.6 3.5 - 5.1 mmol/L   Chloride 97 (L) 101 - 111 mmol/L   CO2 21 (L) 22 - 32 mmol/L   Glucose, Bld 440 (H) 65 - 99 mg/dL   BUN 29 (H) 6 - 20 mg/dL   Creatinine, Ser 8.29 (H) 0.61 - 1.24 mg/dL   Calcium 8.9 8.9 - 56.2 mg/dL   GFR calc non Af Amer 37 (L) >60 mL/min   GFR calc Af Amer 42 (L) >60 mL/min   Anion gap 12 5 - 15  CBC  Result Value Ref Range   WBC 12.5 (H) 3.8 - 10.6 K/uL   RBC 4.90 4.40 - 5.90 MIL/uL   Hemoglobin 14.6 13.0 - 18.0 g/dL   HCT 13.0 86.5 - 78.4 %   MCV 88.5 80.0 - 100.0 fL   MCH 29.8 26.0 - 34.0 pg   MCHC 33.6 32.0 - 36.0 g/dL   RDW 69.6 29.5 - 28.4 %   Platelets 296 150 - 440 K/uL  Troponin I  Result Value Ref Range   Troponin I 0.09 (HH) <0.03 ng/mL  Hemoglobin A1c  Result Value Ref Range   Hgb A1c MFr Bld 12.2 (H) 4.8 - 5.6 %   Mean Plasma Glucose 303.44 mg/dL  Lipid panel  Result Value Ref Range   Cholesterol 218 (H) 0 - 200 mg/dL   Triglycerides 132 (H) <150 mg/dL   HDL 35 (L) >44 mg/dL   Total CHOL/HDL Ratio 6.2 RATIO   VLDL 60 (H) 0 - 40 mg/dL   LDL Cholesterol 010 (H) 0 - 99 mg/dL  Troponin I  Result Value Ref Range   Troponin I 0.11 (HH) <0.03 ng/mL  Troponin I  Result Value Ref Range   Troponin I 0.25 (HH) <0.03 ng/mL  Influenza panel by PCR (type A & B)  Result Value Ref Range   Influenza A By PCR NEGATIVE NEGATIVE   Influenza B By PCR NEGATIVE NEGATIVE  Troponin I  Result Value Ref Range   Troponin I 0.10 (HH) <0.03 ng/mL  Glucose, capillary  Result Value Ref Range   Glucose-Capillary 408 (H) 65 - 99 mg/dL  Basic metabolic panel  Result Value Ref Range   Sodium 134 (L) 135 - 145 mmol/L   Potassium 3.8 3.5 - 5.1 mmol/L   Chloride 101 101 - 111 mmol/L   CO2 26 22 - 32 mmol/L   Glucose, Bld 282 (H) 65 - 99 mg/dL   BUN 30 (H) 6 - 20 mg/dL   Creatinine, Ser 2.72 (H) 0.61 - 1.24 mg/dL   Calcium 8.5 (L) 8.9 - 10.3 mg/dL   GFR calc non Af Amer 43 (L) >60 mL/min   GFR calc Af Amer 50 (L) >60 mL/min  Anion gap 7 5 - 15  CBC  Result Value Ref Range   WBC 11.1 (H) 3.8 - 10.6 K/uL   RBC 4.59 4.40 - 5.90 MIL/uL   Hemoglobin 13.4 13.0 - 18.0 g/dL   HCT 19.7 58.8 - 32.5 %   MCV 89.8 80.0 - 100.0 fL   MCH 29.1 26.0 - 34.0 pg   MCHC 32.5 32.0 - 36.0 g/dL   RDW 49.8 26.4 - 15.8 %   Platelets 274 150 - 440 K/uL  Creatinine, serum  Result Value Ref Range   Creatinine, Ser 1.86 (H) 0.61 - 1.24 mg/dL   GFR calc non Af Amer 34 (L) >60 mL/min   GFR calc Af Amer 40 (L) >60 mL/min  Glucose, capillary  Result Value Ref Range   Glucose-Capillary 477 (H) 65 - 99 mg/dL  Glucose, capillary  Result Value Ref Range   Glucose-Capillary 407 (H) 65 - 99 mg/dL  Glucose, capillary  Result Value Ref Range   Glucose-Capillary 394 (H) 65 - 99 mg/dL  Troponin I  Result Value Ref Range   Troponin I 0.99 (HH) <0.03 ng/mL  Troponin I  Result Value Ref Range   Troponin I 1.19 (HH) <0.03 ng/mL  APTT  Result Value Ref Range   aPTT 31 24 - 36 seconds  Protime-INR  Result Value Ref Range   Prothrombin Time 12.8 11.4 - 15.2 seconds   INR 0.97   Troponin I  Result Value Ref Range   Troponin I 1.09 (HH) <0.03 ng/mL  Heparin level (unfractionated)  Result Value Ref Range   Heparin Unfractionated 0.54 0.30 - 0.70 IU/mL  Glucose, capillary  Result Value Ref Range   Glucose-Capillary 289 (H) 65 - 99 mg/dL   Comment 1 Notify RN   Heparin level (unfractionated)  Result Value Ref Range   Heparin Unfractionated 0.35 0.30 - 0.70 IU/mL  Glucose, capillary  Result Value Ref Range   Glucose-Capillary 384 (H) 65 - 99 mg/dL   Comment 1 Notify RN   Glucose, capillary  Result Value Ref Range   Glucose-Capillary 374 (H) 65 - 99 mg/dL   Comment 1 Notify RN   CBC  Result Value Ref Range   WBC 9.7 3.8 - 10.6 K/uL   RBC 4.50 4.40 - 5.90 MIL/uL   Hemoglobin 13.2 13.0 - 18.0 g/dL   HCT 30.9 40.7 - 68.0 %   MCV 89.0 80.0 - 100.0 fL   MCH 29.5 26.0 - 34.0 pg   MCHC 33.1 32.0 - 36.0 g/dL   RDW 88.1 10.3 - 15.9  %   Platelets 270 150 - 440 K/uL  Lipid panel  Result Value Ref Range   Cholesterol 195 0 - 200 mg/dL   Triglycerides 458 <592 mg/dL   HDL 42 >92 mg/dL   Total CHOL/HDL Ratio 4.6 RATIO   VLDL 22 0 - 40 mg/dL   LDL Cholesterol 446 (H) 0 - 99 mg/dL  Heparin level (unfractionated)  Result Value Ref Range   Heparin Unfractionated 0.34 0.30 - 0.70 IU/mL  Glucose, capillary  Result Value Ref Range   Glucose-Capillary 298 (H) 65 - 99 mg/dL  Heparin level (unfractionated)  Result Value Ref Range   Heparin Unfractionated 0.31 0.30 - 0.70 IU/mL  Glucose, capillary  Result Value Ref Range   Glucose-Capillary 219 (H) 65 - 99 mg/dL   Comment 1 Notify RN   Heparin level (unfractionated)  Result Value Ref Range   Heparin Unfractionated 0.37 0.30 - 0.70 IU/mL  Glucose, capillary  Result Value Ref Range   Glucose-Capillary 327 (H) 65 - 99 mg/dL   Comment 1 Notify RN   Glucose, capillary  Result Value Ref Range   Glucose-Capillary 236 (H) 65 - 99 mg/dL   Comment 1 Notify RN   CBC  Result Value Ref Range   WBC 9.8 3.8 - 10.6 K/uL   RBC 4.34 (L) 4.40 - 5.90 MIL/uL   Hemoglobin 12.8 (L) 13.0 - 18.0 g/dL   HCT 16.1 (L) 09.6 - 04.5 %   MCV 89.9 80.0 - 100.0 fL   MCH 29.6 26.0 - 34.0 pg   MCHC 32.9 32.0 - 36.0 g/dL   RDW 40.9 81.1 - 91.4 %   Platelets 283 150 - 440 K/uL  Basic metabolic panel  Result Value Ref Range   Sodium 134 (L) 135 - 145 mmol/L   Potassium 4.2 3.5 - 5.1 mmol/L   Chloride 100 (L) 101 - 111 mmol/L   CO2 24 22 - 32 mmol/L   Glucose, Bld 245 (H) 65 - 99 mg/dL   BUN 33 (H) 6 - 20 mg/dL   Creatinine, Ser 7.82 (H) 0.61 - 1.24 mg/dL   Calcium 8.4 (L) 8.9 - 10.3 mg/dL   GFR calc non Af Amer 38 (L) >60 mL/min   GFR calc Af Amer 44 (L) >60 mL/min   Anion gap 10 5 - 15  Heparin level (unfractionated)  Result Value Ref Range   Heparin Unfractionated 0.31 0.30 - 0.70 IU/mL  Glucose, capillary  Result Value Ref Range   Glucose-Capillary 281 (H) 65 - 99 mg/dL   Comment 1  Notify RN    Comment 2 Document in Chart   Glucose, capillary  Result Value Ref Range   Glucose-Capillary 252 (H) 65 - 99 mg/dL  Glucose, capillary  Result Value Ref Range   Glucose-Capillary 213 (H) 65 - 99 mg/dL  Glucose, capillary  Result Value Ref Range   Glucose-Capillary 254 (H) 65 - 99 mg/dL  Glucose, capillary  Result Value Ref Range   Glucose-Capillary 374 (H) 65 - 99 mg/dL  Basic metabolic panel  Result Value Ref Range   Sodium 137 135 - 145 mmol/L   Potassium 4.0 3.5 - 5.1 mmol/L   Chloride 105 101 - 111 mmol/L   CO2 24 22 - 32 mmol/L   Glucose, Bld 133 (H) 65 - 99 mg/dL   BUN 26 (H) 6 - 20 mg/dL   Creatinine, Ser 9.56 (H) 0.61 - 1.24 mg/dL   Calcium 8.5 (L) 8.9 - 10.3 mg/dL   GFR calc non Af Amer 46 (L) >60 mL/min   GFR calc Af Amer 54 (L) >60 mL/min   Anion gap 8 5 - 15  Glucose, capillary  Result Value Ref Range   Glucose-Capillary 219 (H) 65 - 99 mg/dL  Glucose, capillary  Result Value Ref Range   Glucose-Capillary 101 (H) 65 - 99 mg/dL  Glucose, capillary  Result Value Ref Range   Glucose-Capillary 134 (H) 65 - 99 mg/dL  Glucose, capillary  Result Value Ref Range   Glucose-Capillary 209 (H) 65 - 99 mg/dL  POCT Activated clotting time  Result Value Ref Range   Activated Clotting Time 313 seconds      Assessment & Plan:   Problem List Items Addressed This Visit      Cardiovascular and Mediastinum   Essential hypertension    BPs persistently elevated since Cardiology f/u a week or so ago. Will add 5 mg amlodipine to enalapril to supplement d/c  of metoprolol given persistent bradycardia. Pt has Cardiology f/u in several weeks where this can be rechecked and adjusted as needed. Pt knows to call with further issues with his HR and BP      Relevant Medications   amLODipine (NORVASC) 5 MG tablet    Other Visit Diagnoses    Bradycardia    -  Primary   Despite lowering metoprolol to 12.5 QD. D/c metoprolol until f/u w/ Cardiology given he's  significantly symptomatic. Push fluids, call with continued sxs       Follow up plan: Return for CPE as scheduled.

## 2017-04-22 ENCOUNTER — Other Ambulatory Visit: Payer: Self-pay | Admitting: *Deleted

## 2017-04-22 NOTE — Patient Outreach (Signed)
04/22/2017  Transition of care call:  Successful telephone encounter to Benjamin Chan, 72 year old male for transition of care/ongoing follow up on  Recent hospitalization January 11-15,2019 for chest pain, NON ST MI, left heart cath done 03/30/17.  Pt's history  Includes but not limited to CAD, HTN, Hyperlipidemia, DM.  Spoke with pt, HIPAA identifiers verified.  Pt reports  On recent visit with PCP- side effect of Metoprolol(view in EMR weak, dizzy) to which pt reports medication  Stopped, started on Amlodipine 5 mg once a day, tolerating well.  Pt reports BP today 148/68, HR 67.  Pt  Reports had a little sob last night, unable to lie flat,had to get up, currently sitting with no sob.   Pt reports this has  Happened before to which RN CM discussed reporting recurrent sob lying down- pt voiced understanding. Pt reports no chest pain or swelling.   Pt reports sugar today was 181, stopped sugar sodas, Doing diet sodas/sugar substitute.   RN CM discussed with pt plan to follow up again next week telephonically.  Plan:  As discussed with pt, plan to follow up again next week telephonically (ongoing transition of care).  Benjamin Chan.   Roseland Braun RN CCM [facility] Care Management  [phone]

## 2017-04-23 ENCOUNTER — Other Ambulatory Visit: Payer: Self-pay | Admitting: Pharmacy Technician

## 2017-04-23 NOTE — Patient Instructions (Signed)
Follow up as scheduled.  

## 2017-04-23 NOTE — Patient Outreach (Signed)
[facility]) Care Management  04/23/2017  LIGHT FLORIAN [birth year] [id number]   Contacted [employer] patient assistance on January 28th, representative informed me they had not processed application for Spiriva and that it can take up to 10 days to do so and suggested that I call back.  Called [employer] and spoke to [employer] who informed me the application has still not been processed and suggested that I call back in another week.  [name] [name] [facility] Care Management [phone]

## 2017-04-23 NOTE — Assessment & Plan Note (Signed)
BPs persistently elevated since Cardiology f/u a week or so ago. Will add 5 mg amlodipine to enalapril to supplement d/c of metoprolol given persistent bradycardia. Pt has Cardiology f/u in several weeks where this can be rechecked and adjusted as needed. Pt knows to call with further issues with his HR and BP

## 2017-04-24 ENCOUNTER — Emergency Department: Payer: Medicare HMO

## 2017-04-24 ENCOUNTER — Encounter: Payer: Self-pay | Admitting: Emergency Medicine

## 2017-04-24 ENCOUNTER — Telehealth: Payer: Self-pay | Admitting: Family Medicine

## 2017-04-24 ENCOUNTER — Emergency Department
Admission: EM | Admit: 2017-04-24 | Discharge: 2017-04-24 | Disposition: A | Discharge status: Home / Self Care | Payer: Medicare HMO | Attending: Emergency Medicine | Admitting: Emergency Medicine

## 2017-04-24 ENCOUNTER — Telehealth: Payer: Self-pay | Admitting: Pharmacist

## 2017-04-24 ENCOUNTER — Other Ambulatory Visit: Payer: Self-pay | Admitting: Family Medicine

## 2017-04-24 ENCOUNTER — Other Ambulatory Visit: Payer: Self-pay

## 2017-04-24 DIAGNOSIS — I129 Hypertensive chronic kidney disease with stage 1 through stage 4 chronic kidney disease, or unspecified chronic kidney disease: Secondary | ICD-10-CM | POA: Diagnosis not present

## 2017-04-24 DIAGNOSIS — I252 Old myocardial infarction: Secondary | ICD-10-CM | POA: Diagnosis not present

## 2017-04-24 DIAGNOSIS — R1031 Right lower quadrant pain: Secondary | ICD-10-CM

## 2017-04-24 DIAGNOSIS — E1122 Type 2 diabetes mellitus with diabetic chronic kidney disease: Secondary | ICD-10-CM | POA: Insufficient documentation

## 2017-04-24 DIAGNOSIS — N189 Chronic kidney disease, unspecified: Secondary | ICD-10-CM | POA: Diagnosis not present

## 2017-04-24 DIAGNOSIS — Z7982 Long term (current) use of aspirin: Secondary | ICD-10-CM | POA: Diagnosis not present

## 2017-04-24 DIAGNOSIS — J449 Chronic obstructive pulmonary disease, unspecified: Secondary | ICD-10-CM | POA: Diagnosis not present

## 2017-04-24 DIAGNOSIS — R1030 Lower abdominal pain, unspecified: Secondary | ICD-10-CM | POA: Diagnosis not present

## 2017-04-24 DIAGNOSIS — Z951 Presence of aortocoronary bypass graft: Secondary | ICD-10-CM | POA: Insufficient documentation

## 2017-04-24 DIAGNOSIS — Z87891 Personal history of nicotine dependence: Secondary | ICD-10-CM | POA: Diagnosis not present

## 2017-04-24 DIAGNOSIS — I10 Essential (primary) hypertension: Secondary | ICD-10-CM | POA: Diagnosis not present

## 2017-04-24 DIAGNOSIS — Z79899 Other long term (current) drug therapy: Secondary | ICD-10-CM | POA: Diagnosis not present

## 2017-04-24 DIAGNOSIS — I2510 Atherosclerotic heart disease of native coronary artery without angina pectoris: Secondary | ICD-10-CM | POA: Insufficient documentation

## 2017-04-24 LAB — URINALYSIS, COMPLETE (UACMP) WITH MICROSCOPIC
BILIRUBIN URINE: NEGATIVE
Glucose, UA: NEGATIVE mg/dL
KETONES UR: NEGATIVE mg/dL
LEUKOCYTES UA: NEGATIVE
NITRITE: NEGATIVE
PROTEIN: NEGATIVE mg/dL
Specific Gravity, Urine: 1.016 (ref 1.005–1.030)
pH: 5 (ref 5.0–8.0)

## 2017-04-24 LAB — BASIC METABOLIC PANEL
Anion gap: 9 (ref 5–15)
BUN: 20 mg/dL (ref 6–20)
CO2: 25 mmol/L (ref 22–32)
Calcium: 9.1 mg/dL (ref 8.9–10.3)
Chloride: 106 mmol/L (ref 101–111)
Creatinine, Ser: 1.67 mg/dL — ABNORMAL HIGH (ref 0.61–1.24)
GFR calc Af Amer: 45 mL/min — ABNORMAL LOW (ref >60)
GFR, EST NON AFRICAN AMERICAN: 39 mL/min — AB (ref >60)
GLUCOSE: 156 mg/dL — AB (ref 65–99)
Potassium: 4 mmol/L (ref 3.5–5.1)
Sodium: 140 mmol/L (ref 135–145)

## 2017-04-24 LAB — CBC
HCT: 40.9 % (ref 40.0–52.0)
HEMOGLOBIN: 13.7 g/dL (ref 13.0–18.0)
MCH: 29.8 pg (ref 26.0–34.0)
MCHC: 33.5 g/dL (ref 32.0–36.0)
MCV: 88.9 fL (ref 80.0–100.0)
Platelets: 269 10*3/uL (ref 150–440)
RBC: 4.6 MIL/uL (ref 4.40–5.90)
RDW: 13.7 % (ref 11.5–14.5)
WBC: 10.6 10*3/uL (ref 3.8–10.6)

## 2017-04-24 MED ORDER — AMLODIPINE BESYLATE 5 MG PO TABS: 5.0000 mg | ORAL_TABLET | Freq: Every day | ORAL | 1 refills | Status: DC

## 2017-04-24 NOTE — Telephone Encounter (Signed)
-----   Message from Wynonia Hazard, [facility] sent at 04/24/2017  2:46 PM EST ----- Hi Dr. Dossie Arbour and Ms. Maurice March,  Please see note regarding [name].  Per spouse, patient is "not right" and worse than when he came in for an appointment on 04/20/17.   She reports he is tired all the time, sleeps much more than usual, and cannot lie flat due to SOB.  She confirms he has stopped metoprolol and has started amlodipine.  She is concerned that he needs to be seen back in the office.     Also, Norvasc is Tier 1 and will be $0 if ordered through [facility] mail order.    Thanks, [name], PharmD, [facility] Clinical Pharmacist [employer] [employer] [phone]

## 2017-04-24 NOTE — ED Triage Notes (Signed)
Pt to ED via POV with c/o lower abd pain and groin pain. Pt had stents placed x2wks ago. Pt A&Ox4, denies any swelling to groin area. VS stable

## 2017-04-24 NOTE — Patient Outreach (Signed)
[facility]) Care Management  04/24/2017  KAORU FRECHETTE 07-21-1945 [id number]  Successful call to Mr. Cedano.  HIPAA identifiers verified. Patient prefers that I speak with his wife regarding his medications.    Spouse reports that patient is feeling very tired and weak and symptoms are getting worse.  She reports that patient has to sleep sitting up because of shortness of breath.  She confirms that he has stopped the metoprolol and has picked up new medication, amlodipine, from [facility].  She reports his heart rate ranges from 50s-70 (never over 70).    Spouse reports that [facility] mail order pharmacy has sent them:   -new glucometer (Accucheck) + supplies  -Aspirin  -Vitamins  -Finasteride  -Doxazosin  -Enalapril  -Atorvastatin  Spouse reports they have not received anything in the mail yet from [employer] regarding Extra Help.   I reviewed with patient that Spiriva PAP is still in process and that [facility] will follow-up with program next week.  I also reviewed that Norvasc will be $0 if ordered through mail order pharmacy.  Patient and spouse are agreeable to receive this via mail order to save money.   Plan: I will call Dr. Christell Faith office to alert them about Mr. Stradling's weakness, fatigue, and SOB when lying down.  I will also request that future refills on Norvasc be sent to [facility] pharmacy so patient will have $0 copay.    I will follow-up with patient next week regarding Spiriva PAP and Extra Help.   [name], PharmD, [facility] Clinical Pharmacist [employer] [phone]

## 2017-04-24 NOTE — ED Provider Notes (Signed)
[facility] Emergency Department Provider Note   ____________________________________________    I have reviewed the triage vital signs and the nursing notes.   HISTORY  Chief Complaint Abdominal Pain and Groin Pain     HPI Benjamin Chan is a 72 y.o. male who presents with complaints of right groin pain.  Patient notes that he had cardiac stents placed approximately 2 weeks ago and the access through his right groin, since then he has had discomfort in his right groin.  Today he came "because his wife made him ".  He denies redness or swelling to the area.  Currently reports he has no pain, he says he intermittently has a pinching pain in the area.  No fevers or chills.  Has not taken anything for this   Past Medical History:  Diagnosis Date  . BPH (benign prostatic hyperplasia)   . CAD (coronary artery disease)   . Chronic kidney disease   . COPD (chronic obstructive pulmonary disease) (HCC)   . Diabetes mellitus without complication (HCC)   . Hypertension   . MI (myocardial infarction) (HCC) 03/2017    Patient Active Problem List   Diagnosis Date Noted  . COPD (chronic obstructive pulmonary disease) (HCC) 04/13/2017  . BPH (benign prostatic hyperplasia) 04/13/2017  . Essential hypertension 04/13/2017  . Diabetes mellitus without complication (HCC) 04/13/2017  . History of myocardial infarction 04/13/2017  . Chest pain 03/27/2017  . Renal failure (ARF), acute on chronic (HCC) 07/02/2015  . Bacteremia 07/02/2015  . Sepsis (HCC) 06/29/2015  . UTI (lower urinary tract infection) 06/29/2015    Past Surgical History:  Procedure Laterality Date  . CORONARY ARTERY BYPASS GRAFT    . CORONARY STENT INTERVENTION N/A 03/30/2017   Procedure: CORONARY STENT INTERVENTION;  Surgeon: Alwyn Pea, MD;  Location: [facility] INVASIVE CV LAB;  Service: Cardiovascular;  Laterality: N/A;  . INGUINAL HERNIA REPAIR     right  . LEFT HEART CATH AND  CORS/GRAFTS ANGIOGRAPHY N/A 03/30/2017   Procedure: LEFT HEART CATH AND CORS/GRAFTS ANGIOGRAPHY;  Surgeon: Lamar Blinks, MD;  Location: [facility] INVASIVE CV LAB;  Service: Cardiovascular;  Laterality: N/A;    Prior to Admission medications   Medication Sig Start Date End Date Taking? Authorizing Provider  ACCU-CHEK FASTCLIX LANCETS MISC Use one lancet to check blood sugars between 1-4 times daily 04/14/17   [name], PA-C  acetaminophen (TYLENOL) 325 MG tablet Take 650 mg by mouth every 6 (six) hours as needed.    [provider]  albuterol (PROVENTIL HFA;VENTOLIN HFA) 108 (90 Base) MCG/ACT inhaler Inhale 2 puffs into the lungs every 6 (six) hours as needed for wheezing or shortness of breath. 04/10/17   [name], PA-C  Alcohol Swabs (B-D SINGLE USE SWABS REGULAR) PADS Use one swab each time blood sugar is checked to clean site prior to lancet use 04/14/17   [name], PA-C  amLODipine (NORVASC) 5 MG tablet Take 1 tablet (5 mg total) by mouth daily. 04/24/17   [name], PA-C  aspirin 81 MG tablet Take 81 mg by mouth daily. 02/19/10   [provider]  atorvastatin (LIPITOR) 40 MG tablet Take 1 tablet (40 mg total) by mouth daily. 04/16/17   [name], PA-C  budesonide-formoterol [facility]) 160-4.5 MCG/ACT inhaler Inhale 2 puffs into the lungs 2 (two) times daily. 04/10/17   [name], PA-C  doxazosin (CARDURA) 2 MG tablet Take 1 tablet (2 mg total) by mouth  daily. 04/16/17   [name], PA-C  enalapril (VASOTEC) 20 MG tablet Take 1 tablet (20 mg total) by mouth daily. 04/16/17   [name], PA-C  finasteride (PROSCAR) 5 MG tablet Take 1 tablet (5 mg total) by mouth daily. 04/16/17   [name], PA-C  fluticasone [facility]) 50 MCG/ACT nasal spray Place into both nostrils daily. Pt takes twice a day    [provider]  glucose blood (ACCU-CHEK AVIVA) test strip Use as  instructed 04/16/17   Steele Sizer, MD  insulin NPH Human (HUMULIN N,NOVOLIN N) 100 UNIT/ML injection Inject 35 Units into the skin 2 (two) times daily before a meal. 35 units twice a day.      [provider]  nitroGLYCERIN (NITROSTAT) 0.4 MG SL tablet Place 0.4 mg under the tongue. 05/16/14   [provider]  ticagrelor (BRILINTA) 90 MG TABS tablet Take 1 tablet (90 mg total) by mouth 2 (two) times daily. 03/31/17   Auburn Bilberry, MD  tiotropium (SPIRIVA) 18 MCG inhalation capsule Place 1 capsule (18 mcg total) into inhaler and inhale daily. 04/10/17   [name], PA-C     Allergies Patient has no known allergies.  Family History  Problem Relation Age of Onset  . Hypertension Father   . Hypertension Mother   . Hypertension Sister   . Hypertension Brother     Social History Social History   Tobacco Use  . Smoking status: Former Smoker    Years: 40.00  . Smokeless tobacco: Never Used  Substance Use Topics  . Alcohol use: No  . Drug use: No    Review of Systems  Constitutional: No fever/chills Eyes: No visual changes.  ENT: No neck pain Cardiovascular: Denies chest pain. Respiratory: Denies cough Gastrointestinal:  No nausea, no vomiting.   Genitourinary: No groin swelling Musculoskeletal: Negative for back pain. Skin: Negative for rash. Neurological: Negative for headaches    ____________________________________________   PHYSICAL EXAM:  VITAL SIGNS: ED Triage Vitals [04/24/17 1633]  Enc Vitals Group     BP (!) 144/66     Pulse Rate 67     Resp 16     Temp 99.5 F (37.5 C)     Temp Source Oral     SpO2 97 %     Weight 98 kg (216 lb)     Height      Head Circumference      Peak Flow      Pain Score 10     Pain Loc      Pain Edu?      Excl. in GC?     Constitutional: Alert and oriented. No acute distress. Pleasant and interactive Eyes: Conjunctivae are normal.  Head: Atraumatic. Nose: No  congestion/rhinnorhea.  Cardiovascular: Normal rate, regular rhythm. Grossly normal heart sounds.  Good peripheral circulation. Respiratory: Normal respiratory effort.  No retractions. Lungs CTAB. Gastrointestinal: Soft and nontender. No distention.  No CVA tenderness. Genitourinary: Groin exam is normal, no evidence of hernia, no scrotal swelling, normal penis, no thrill or vascular abnormality Musculoskeletal:.  Warm and well perfused Neurologic:  Normal speech and language. No gross focal neurologic deficits are appreciated.  Skin:  Skin is warm, dry and intact. No rash noted. Psychiatric: Mood and affect are normal. Speech and behavior are normal.  ____________________________________________   LABS (all labs ordered are listed, but only abnormal results are displayed)  Labs Reviewed  BASIC METABOLIC PANEL - Abnormal; Notable for the following components:  Result Value   Glucose, Bld 156 (*)    Creatinine, Ser 1.67 (*)    GFR calc non Af Amer 39 (*)    GFR calc Af Amer 45 (*)    All other components within normal limits  URINALYSIS, COMPLETE (UACMP) WITH MICROSCOPIC - Abnormal; Notable for the following components:   Color, Urine YELLOW (*)    APPearance HAZY (*)    Hgb urine dipstick SMALL (*)    Bacteria, UA MANY (*)    Squamous Epithelial / LPF 0-5 (*)    All other components within normal limits  CBC  CBG MONITORING, ED   ____________________________________________  EKG  None ____________________________________________  RADIOLOGY  Ultrasound negative for pseudoaneurysm ____________________________________________   PROCEDURES  Procedure(s) performed: No  Procedures   Critical Care performed: No ____________________________________________   INITIAL IMPRESSION / ASSESSMENT AND PLAN / ED COURSE  Pertinent labs & imaging results that were available during my care of the patient were reviewed by me and considered in my medical decision making (see  chart for details).  Patient well-appearing and asymptomatic.  Ultrasound negative for hematoma or pseudoaneurysm.  Given reassuring imaging, reassuring exam and the fact that the patient has no symptoms will discharge with outpatient follow-up    ____________________________________________   FINAL CLINICAL IMPRESSION(S) / ED DIAGNOSES  Final diagnoses:  Groin pain, right        Note:  This document was prepared using Dragon voice recognition software and may include unintentional dictation errors.    Jene Every, MD 04/24/17 [phone]

## 2017-04-24 NOTE — ED Notes (Signed)
Spoke to MD Derrill Kay about pt presentation and symptoms. Verbal orders given for [place]

## 2017-04-24 NOTE — Telephone Encounter (Signed)
Copied from CRM [id number]. Topic: Quick Communication - See Telephone Encounter >> Apr 24, 2017  2:32 PM Eston Mould B wrote: CRM for notification. See Telephone encounter for:  [name]  from [facility] called to let Dr Dossie Arbour know the pts wife states the pt complains of weakness, exhaustion, and sleeps a lot and has to sleep sitting up due to sob. She a.also says all rxs should go to [facility] mail order. Her contact number  is [phone] 04/24/17.

## 2017-04-24 NOTE — Telephone Encounter (Signed)
Per chart review, pt has presented to the ER this afternoon and is currently there. Will send amlodipine to mail order pharmacy for cost purposes and await ER notes. Will call on Monday to f/u on how he's feeling.

## 2017-04-27 ENCOUNTER — Ambulatory Visit (INDEPENDENT_AMBULATORY_CARE_PROVIDER_SITE_OTHER): Payer: Medicare HMO | Admitting: Family Medicine

## 2017-04-27 ENCOUNTER — Encounter: Payer: Self-pay | Admitting: Family Medicine

## 2017-04-27 VITALS — BP 160/74 | HR 66 | Wt 204.0 lb

## 2017-04-27 DIAGNOSIS — I10 Essential (primary) hypertension: Secondary | ICD-10-CM

## 2017-04-27 DIAGNOSIS — R1031 Right lower quadrant pain: Secondary | ICD-10-CM

## 2017-04-27 NOTE — Progress Notes (Signed)
BP (!) 160/74   Pulse 66   Wt 204 lb (92.5 kg)   SpO2 98%   BMI 29.27 kg/m    Subjective:    Patient ID: Benjamin Chan, male    DOB: November 20, 1945, 72 y.o.   MRN: [id number]  HPI: Benjamin Chan is a 72 y.o. male  Chief Complaint  Patient presents with  . Hospitalization Follow-up   Pt here today for hospital f/u for right going pain in the site of his catheter insertion. U/s was negative for hematoma or other abnormalities at the site, and no evidence of infection or abscess. Pain has improved and is only every once in a while, well controlled on tylenol prn.   Pt recently d/c'd metoprolol as he was significantly bradycardic even with 12.5 mg qd. Pulses have been improved, in the 60s and 70s with much less fatigue and dizziness. Amlodipine 5 mg was added for better BP control. BPs at home have been in the 150s/70s, no CP, dizziness, HAs noted. Seeing Cardiology in about 2 weeks for f/u.   Past Medical History:  Diagnosis Date  . BPH (benign prostatic hyperplasia)   . CAD (coronary artery disease)   . Chronic kidney disease   . COPD (chronic obstructive pulmonary disease) (HCC)   . Diabetes mellitus without complication (HCC)   . Hypertension   . MI (myocardial infarction) (HCC) 03/2017   Social History   Socioeconomic History  . Marital status: Married    Spouse name: Not on file  . Number of children: Not on file  . Years of education: Not on file  . Highest education level: Not on file  Social Needs  . Financial resource strain: Not on file  . Food insecurity - worry: Not on file  . Food insecurity - inability: Not on file  . Transportation needs - medical: Not on file  . Transportation needs - non-medical: Not on file  Occupational History  . Occupation: retired  Tobacco Use  . Smoking status: Former Smoker    Years: 40.00  . Smokeless tobacco: Never Used  Substance and Sexual Activity  . Alcohol use: No  . Drug use: No  . Sexual activity: Not on  file  Other Topics Concern  . Not on file  Social History Narrative  . Not on file   Relevant past medical, surgical, family and social history reviewed and updated as indicated. Interim medical history since our last visit reviewed. Allergies and medications reviewed and updated.  Review of Systems  Per HPI unless specifically indicated above     Objective:    BP (!) 160/74   Pulse 66   Wt 204 lb (92.5 kg)   SpO2 98%   BMI 29.27 kg/m   Wt Readings from Last 3 Encounters:  04/27/17 204 lb (92.5 kg)  04/24/17 216 lb (98 kg)  04/20/17 216 lb 8 oz (98.2 kg)    Physical Exam  Constitutional: He is oriented to person, place, and time. He appears well-developed and well-nourished. No distress.  HENT:  Head: Atraumatic.  Eyes: Conjunctivae are normal. Pupils are equal, round, and reactive to light.  Neck: Normal range of motion. Neck supple.  Cardiovascular: Normal rate and normal heart sounds.  Pulmonary/Chest: Effort normal and breath sounds normal. No respiratory distress.  Musculoskeletal: Normal range of motion.  Neurological: He is alert and oriented to person, place, and time.  Skin: Skin is warm and dry. No rash noted. No erythema.  Psychiatric: He has a  normal mood and affect. His behavior is normal.  Nursing note and vitals reviewed.  Results for orders placed or performed during the hospital encounter of 04/24/17  Basic metabolic panel  Result Value Ref Range   Sodium 140 135 - 145 mmol/L   Potassium 4.0 3.5 - 5.1 mmol/L   Chloride 106 101 - 111 mmol/L   CO2 25 22 - 32 mmol/L   Glucose, Bld 156 (H) 65 - 99 mg/dL   BUN 20 6 - 20 mg/dL   Creatinine, Ser 1.61 (H) 0.61 - 1.24 mg/dL   Calcium 9.1 8.9 - 09.6 mg/dL   GFR calc non Af Amer 39 (L) >60 mL/min   GFR calc Af Amer 45 (L) >60 mL/min   Anion gap 9 5 - 15  CBC  Result Value Ref Range   WBC 10.6 3.8 - 10.6 K/uL   RBC 4.60 4.40 - 5.90 MIL/uL   Hemoglobin 13.7 13.0 - 18.0 g/dL   HCT 04.5 40.9 - 81.1 %    MCV 88.9 80.0 - 100.0 fL   MCH 29.8 26.0 - 34.0 pg   MCHC 33.5 32.0 - 36.0 g/dL   RDW 91.4 78.2 - 95.6 %   Platelets 269 150 - 440 K/uL  Urinalysis, Complete w Microscopic  Result Value Ref Range   Color, Urine YELLOW (A) YELLOW   APPearance HAZY (A) CLEAR   Specific Gravity, Urine 1.016 1.005 - 1.030   pH 5.0 5.0 - 8.0   Glucose, UA NEGATIVE NEGATIVE mg/dL   Hgb urine dipstick SMALL (A) NEGATIVE   Bilirubin Urine NEGATIVE NEGATIVE   Ketones, ur NEGATIVE NEGATIVE mg/dL   Protein, ur NEGATIVE NEGATIVE mg/dL   Nitrite NEGATIVE NEGATIVE   Leukocytes, UA NEGATIVE NEGATIVE   RBC / HPF 0-5 0 - 5 RBC/hpf   WBC, UA 6-30 0 - 5 WBC/hpf   Bacteria, UA MANY (A) NONE SEEN   Squamous Epithelial / LPF 0-5 (A) NONE SEEN   Mucus PRESENT       Assessment & Plan:   Problem List Items Addressed This Visit      Cardiovascular and Mediastinum   Essential hypertension    BPs mildly improved with addition of 5 mg amlodipine, and HR much improved with metoprolol held for now. Will likely need to increase amlodipine in the near future but will continue to monitor for now and get Cardiology input in 2 weeks       Other Visit Diagnoses    Right groin pain    -  Primary   No evidence of infection or hematoma, healing well with improving sxs. Continue prn tylenol, ice. F/u if worsening or no improvement       Follow up plan: Return for CPE as scheduled.

## 2017-04-27 NOTE — Telephone Encounter (Signed)
Saw patient in clinic today for hospital follow up and addressed med management. Pt feeling much better overall today and doing ok on his current medications. Will await Cardiology f/u in 2 weeks and see him back for his CPE as scheduled upcoming.

## 2017-04-29 ENCOUNTER — Ambulatory Visit: Payer: Self-pay | Admitting: Pharmacist

## 2017-04-29 ENCOUNTER — Other Ambulatory Visit: Payer: Self-pay | Admitting: Family Medicine

## 2017-04-29 ENCOUNTER — Other Ambulatory Visit: Payer: Self-pay | Admitting: Pharmacy Technician

## 2017-04-29 ENCOUNTER — Ambulatory Visit: Payer: Self-pay | Admitting: *Deleted

## 2017-04-29 ENCOUNTER — Other Ambulatory Visit: Payer: Self-pay | Admitting: Pharmacist

## 2017-04-29 MED ORDER — TIOTROPIUM BROMIDE MONOHYDRATE 18 MCG IN CAPS: 18.0000 ug | ORAL_CAPSULE | Freq: Every day | RESPIRATORY_TRACT | 4 refills | Status: DC

## 2017-04-29 NOTE — Patient Instructions (Signed)
Follow up as scheduled.  

## 2017-04-29 NOTE — Patient Outreach (Signed)
[facility]) Care Management  04/29/2017  Benjamin Chan 07-08-1945 [id number]   Successful call to [employer]. Spoke to [place] whom stated patient has been approved as of 04/29/17 for the remainder of 2019, however they are requesting someone from Dr. Christell Faith office call and verify script that was sent to them.  [name] [name] [facility] Care Management [phone]

## 2017-04-29 NOTE — Patient Outreach (Signed)
[facility]) Care Management  04/29/2017  Benjamin Chan 01/28/1946 [id number]   Sent In basket in reference to [employer] patient assistance to Dr. Dossie Arbour. Informed provider of the request from company that someone from office call and verify prescription. Also requested to be informed once confirmed so that I may follow up with the company.  [name] [name] [facility] Care Management [phone]

## 2017-04-29 NOTE — Assessment & Plan Note (Signed)
BPs mildly improved with addition of 5 mg amlodipine, and HR much improved with metoprolol held for now. Will likely need to increase amlodipine in the near future but will continue to monitor for now and get Cardiology input in 2 weeks

## 2017-04-30 NOTE — Patient Outreach (Signed)
[facility]) Care Management  04/30/2017  Benjamin Chan 05-Aug-1945 [id number]  Successful telephone call to Benjamin Chan today. HIPAA identifiers verified. Benjamin Chan states he is feeling much better today than last week and is sleeping better.  He reports that he received a phone call from [employer] asking if he had any additional increase in income which he denied.  He was told that he would receive a letter in the mail shortly with a decision on his Extra Help Application.    I relayed to Benjamin Chan that his PAP application for Spiriva has been approved and pending clarification by Dr. Dossie Arbour, medication will be mailed to him directly to his home.  Patient expressed understanding and gratitude.   Plan: I will follow-up with Benjamin Chan next week regarding Extra Help.   [name], PharmD, [facility] Clinical Pharmacist [employer] [phone]

## 2017-05-04 ENCOUNTER — Other Ambulatory Visit: Payer: Self-pay | Admitting: Pharmacy Technician

## 2017-05-04 ENCOUNTER — Other Ambulatory Visit: Payer: Self-pay | Admitting: *Deleted

## 2017-05-04 NOTE — Patient Outreach (Signed)
05/04/2017   Successful telephone encounter to Benjamin Chan, 72 year old male for final transition of care call/ongoing follow up on recent hospitalization January 11-15,2019 for chest pain,NON ST MI, left heart cath done 03/30/17.   Pt's history includes but not limited to CAD, HTN, Hyperlipidemia, DM.  Spoke with pt, HIPAA identifiers verified.   Pt reports on recent  ED visit (04/24/17- right groin pain) and visit with PCP, no pain in right groin but does have pain in his private area that comes and goes, Tylenol helps.  Pt reports did not report this to PCP, no problems urinating. RN CM discussed with pt call PCP to report pain in his  Private area to which pt agreed to do.   Pt reports no chest pain, little sob when lifting something, little edema in legs to which was told would have taking new heart medication (Norvasc), told to elevate legs.  Pt reports BP and HR good, today BP 149/78, HR 71, sugar today 194.   RN CM discussed with pt today final transition of care call, to follow up  Again next month telephonically- check on clinical status to which pt was agreeable.    Plan:  As discussed, pt to call PCP and report pain in his private area.            As discussed with pt, plan to follow up again next month telephonically- check on              Clinical status.    Shayne Alken.   Gursimran Litaker RN CCM [facility] Care Management  [phone]

## 2017-05-04 NOTE — Patient Outreach (Signed)
[facility]) Care Management  05/04/2017  DSEAN DORAME 1946/02/11 [id number]  Spoke to representative Diannia Ruder at [facility] patient assistance whom verified patient is approved for Spiriva until 03/16/2018. Diannia Ruder stated that a 3 month supply of Spiriva was mailed to patient on 04/30/17 and that it takes 5-7 business days for patient to receive.  [name] [name] [facility] Care Management [phone]

## 2017-05-08 ENCOUNTER — Other Ambulatory Visit: Payer: Self-pay | Admitting: Pharmacist

## 2017-05-08 ENCOUNTER — Ambulatory Visit: Payer: Self-pay | Admitting: Pharmacist

## 2017-05-08 NOTE — Patient Outreach (Signed)
[facility]) Care Management  05/08/2017  OMMAR NOTTAGE 1945-07-18 [id number]  Successful call placed to Mr. Kanno for follow-up on medication assistance with Spiriva and Extra Help.  HIPAA identifiers verified.   Medication assistance:  Patient reports that Spiriva was delivered to his home this week.  He reports that he has not received mailed notification regarding Extra Help.  Per review of [employer], patient has been approved for Partial Extra Help.  I reviewed with patient and his wife that Partial Extra Help benefits offer a 15% co-insurance on medications or the [facility] co-pay, whichever is less.  Patient voiced understanding.  He has received requested [employer] OTC catalogue medications and [employer] mail order prescriptions and is aware to order from OTC catalogue every 3 months.    Medication management:  Per spouse, patient is still feeling fatigued despite stopping beta blocker.   He will have a follow-up with the cardiologist next week.  Patient reports he is still using his spouses' insulin.  I reviewed with him that [employer] brand relion NPH is available for $25/vial.  Patient expressed understanding but wishes to continue using spouses' insulin for now.    Plan: Patient requests that I follow-up with him next week.  I will outreach him after his cardiology appointment.  If no further medication related issues, I will close his case at that time.   [name], PharmD, [facility] Clinical Pharmacist [employer] [phone]

## 2017-05-13 ENCOUNTER — Ambulatory Visit: Payer: Self-pay | Admitting: Pharmacist

## 2017-05-13 ENCOUNTER — Other Ambulatory Visit: Payer: Self-pay | Admitting: Pharmacist

## 2017-05-13 DIAGNOSIS — I10 Essential (primary) hypertension: Secondary | ICD-10-CM | POA: Diagnosis not present

## 2017-05-13 DIAGNOSIS — I214 Non-ST elevation (NSTEMI) myocardial infarction: Secondary | ICD-10-CM | POA: Diagnosis not present

## 2017-05-13 DIAGNOSIS — E782 Mixed hyperlipidemia: Secondary | ICD-10-CM | POA: Diagnosis not present

## 2017-05-13 DIAGNOSIS — I25810 Atherosclerosis of coronary artery bypass graft(s) without angina pectoris: Secondary | ICD-10-CM | POA: Diagnosis not present

## 2017-05-13 NOTE — Patient Outreach (Signed)
[facility]) Care Management  05/13/2017  Benjamin Chan November 13, 1945 [id number]  Outreach call placed to [name] for follow-up on medication management and medication assistance.  Patient requests that I call him tomorrow as he is not at home and cannot talk.   Plan: I will call patient tomorrow morning after 9AM per patient request.   [name], PharmD, [facility] Clinical Pharmacist [employer] [phone]

## 2017-05-14 ENCOUNTER — Ambulatory Visit: Payer: Self-pay | Admitting: Pharmacist

## 2017-05-14 ENCOUNTER — Other Ambulatory Visit: Payer: Self-pay | Admitting: Pharmacist

## 2017-05-14 NOTE — Patient Outreach (Signed)
[facility]) Care Management  05/14/2017  Benjamin Chan 06-10-1945 [id number]  Successful call placed to Mr.  Chan.  HIPAA identifiers verified. Benjamin Chan reports he is doing well with medications.  He received his letter from [employer] and is appreciative of the benefits with Partial Extra Help program.  He is aware to utilize [facility] OTC program every quarter.  No further medication questions or concerns noted at this time.  I provided my phone number if patient needs to reach out to me in the future.   Plan: [facility] pharmacy will close patient case at this time.  I will send patient closure letter, MD discipline closure letter, and alert [facility] RN.   Benjamin Chan, PharmD, [facility] Clinical Pharmacist [employer] [phone]

## 2017-05-18 ENCOUNTER — Encounter: Payer: Self-pay | Admitting: Family Medicine

## 2017-05-18 ENCOUNTER — Ambulatory Visit (INDEPENDENT_AMBULATORY_CARE_PROVIDER_SITE_OTHER): Payer: Medicare HMO | Admitting: Family Medicine

## 2017-05-18 VITALS — BP 138/58 | HR 85 | Ht 68.0 in | Wt 217.0 lb

## 2017-05-18 DIAGNOSIS — I10 Essential (primary) hypertension: Secondary | ICD-10-CM

## 2017-05-18 DIAGNOSIS — N50811 Right testicular pain: Secondary | ICD-10-CM

## 2017-05-18 NOTE — Progress Notes (Signed)
BP (!) 138/58   Pulse 85   Ht 5\' 8"  (1.727 m)   Wt 217 lb (98.4 kg)   SpO2 98%   BMI 32.99 kg/m    Subjective:    Patient ID: Benjamin Chan, male    DOB: 17-Jul-1945, 72 y.o.   MRN: [id number]  HPI: ARTIN MCEUEN is a 72 y.o. male  Chief Complaint  Patient presents with  . Groin Pain    Been ongoing for "awhile", recent flair x 1 week. Pain comes and goes. Pain present w/ sitting and/or walking.    Right "groin" pain that patient has noticed mostly the past week. Pain comes and goes, seems to be worst in the mornings when he gets up. Dull toothache sensation. When asked to point, pt points to right scrotal area and states the testicle is sore. No swelling, known trauma, redness, urinary sxs, fevers, masses per pt. Tylenol helps some, has been taking prn. .   Recently saw Cardiology for f/u, where amlodipine was increased to 10 mg daily due to continued HTN. Also still taking the enalapril, but was made to continue hold of b blocker due to fatigue, SOB, and bradycardia. Home BPs have continued to be mildly elevated in the 150s/80s-90s. Denies CP, dizziness, HAs.   Past Medical History:  Diagnosis Date  . BPH (benign prostatic hyperplasia)   . CAD (coronary artery disease)   . Chronic kidney disease   . COPD (chronic obstructive pulmonary disease) (HCC)   . Diabetes mellitus without complication (HCC)   . Hypertension   . MI (myocardial infarction) (HCC) 03/2017   Social History   Socioeconomic History  . Marital status: Married    Spouse name: Not on file  . Number of children: Not on file  . Years of education: Not on file  . Highest education level: Not on file  Social Needs  . Financial resource strain: Not on file  . Food insecurity - worry: Not on file  . Food insecurity - inability: Not on file  . Transportation needs - medical: Not on file  . Transportation needs - non-medical: Not on file  Occupational History  . Occupation: retired  Tobacco Use  .  Smoking status: Former Smoker    Years: 40.00  . Smokeless tobacco: Never Used  Substance and Sexual Activity  . Alcohol use: No  . Drug use: No  . Sexual activity: Not on file  Other Topics Concern  . Not on file  Social History Narrative  . Not on file   Relevant past medical, surgical, family and social history reviewed and updated as indicated. Interim medical history since our last visit reviewed. Allergies and medications reviewed and updated.  Review of Systems  Per HPI unless specifically indicated above     Objective:    BP (!) 138/58   Pulse 85   Ht 5\' 8"  (1.727 m)   Wt 217 lb (98.4 kg)   SpO2 98%   BMI 32.99 kg/m   Wt Readings from Last 3 Encounters:  05/18/17 217 lb (98.4 kg)  04/27/17 204 lb (92.5 kg)  04/24/17 216 lb (98 kg)    Physical Exam  Constitutional: He is oriented to person, place, and time. He appears well-developed and well-nourished. No distress.  HENT:  Head: Atraumatic.  Eyes: Conjunctivae are normal. Pupils are equal, round, and reactive to light.  Neck: Normal range of motion. Neck supple.  Cardiovascular: Normal rate and normal heart sounds.  Pulmonary/Chest: Effort normal and  breath sounds normal. No respiratory distress.  Abdominal: Soft. Bowel sounds are normal. There is no tenderness.  Genitourinary: Penis normal.  Genitourinary Comments: B/l scrotum not edematous, erythematous, or warm to the touch. Right testicle ttp with no palpable masses or asymmetry. Left testicle benign. No hernia defects palpated  Musculoskeletal: Normal range of motion. He exhibits no edema, tenderness (No CVA tenderness b/l) or deformity.  Neurological: He is alert and oriented to person, place, and time.  Skin: Skin is warm and dry. No rash noted. No erythema.  Psychiatric: He has a normal mood and affect. His behavior is normal.  Nursing note and vitals reviewed.  Results for orders placed or performed during the hospital encounter of 04/24/17  Basic  metabolic panel  Result Value Ref Range   Sodium 140 135 - 145 mmol/L   Potassium 4.0 3.5 - 5.1 mmol/L   Chloride 106 101 - 111 mmol/L   CO2 25 22 - 32 mmol/L   Glucose, Bld 156 (H) 65 - 99 mg/dL   BUN 20 6 - 20 mg/dL   Creatinine, Ser 2.44 (H) 0.61 - 1.24 mg/dL   Calcium 9.1 8.9 - 01.0 mg/dL   GFR calc non Af Amer 39 (L) >60 mL/min   GFR calc Af Amer 45 (L) >60 mL/min   Anion gap 9 5 - 15  CBC  Result Value Ref Range   WBC 10.6 3.8 - 10.6 K/uL   RBC 4.60 4.40 - 5.90 MIL/uL   Hemoglobin 13.7 13.0 - 18.0 g/dL   HCT 27.2 53.6 - 64.4 %   MCV 88.9 80.0 - 100.0 fL   MCH 29.8 26.0 - 34.0 pg   MCHC 33.5 32.0 - 36.0 g/dL   RDW 03.4 74.2 - 59.5 %   Platelets 269 150 - 440 K/uL  Urinalysis, Complete w Microscopic  Result Value Ref Range   Color, Urine YELLOW (A) YELLOW   APPearance HAZY (A) CLEAR   Specific Gravity, Urine 1.016 1.005 - 1.030   pH 5.0 5.0 - 8.0   Glucose, UA NEGATIVE NEGATIVE mg/dL   Hgb urine dipstick SMALL (A) NEGATIVE   Bilirubin Urine NEGATIVE NEGATIVE   Ketones, ur NEGATIVE NEGATIVE mg/dL   Protein, ur NEGATIVE NEGATIVE mg/dL   Nitrite NEGATIVE NEGATIVE   Leukocytes, UA NEGATIVE NEGATIVE   RBC / HPF 0-5 0 - 5 RBC/hpf   WBC, UA 6-30 0 - 5 WBC/hpf   Bacteria, UA MANY (A) NONE SEEN   Squamous Epithelial / LPF 0-5 (A) NONE SEEN   Mucus PRESENT       Assessment & Plan:   Problem List Items Addressed This Visit      Cardiovascular and Mediastinum   Essential hypertension    BP improved on recheck today, but will continue to monitor closely as home readings still not quite to goal with new regimen. Continue current medications and work hard on DASH diet and increasing aerobic exercise.       Relevant Medications   amLODipine (NORVASC) 10 MG tablet    Other Visit Diagnoses    Right testicular pain    -  Primary   Pt declines urine testing today, wanting to start with u/s and go from there. Await results of right scrotal u/s. Tylenol, ice prn   Relevant  Orders   US Scrotum       Follow up plan: Return for as scheduled.

## 2017-05-20 NOTE — Assessment & Plan Note (Signed)
BP improved on recheck today, but will continue to monitor closely as home readings still not quite to goal with new regimen. Continue current medications and work hard on DASH diet and increasing aerobic exercise.

## 2017-05-20 NOTE — Patient Instructions (Signed)
Follow up as scheduled.  

## 2017-05-22 ENCOUNTER — Ambulatory Visit
Admission: RE | Admit: 2017-05-22 | Discharge: 2017-05-22 | Disposition: A | Discharge status: Home / Self Care | Payer: Medicare HMO | Source: Ambulatory Visit | Attending: Family Medicine | Admitting: Family Medicine

## 2017-05-22 DIAGNOSIS — N50811 Right testicular pain: Secondary | ICD-10-CM | POA: Diagnosis not present

## 2017-05-22 DIAGNOSIS — N433 Hydrocele, unspecified: Secondary | ICD-10-CM | POA: Insufficient documentation

## 2017-05-22 DIAGNOSIS — N503 Cyst of epididymis: Secondary | ICD-10-CM | POA: Insufficient documentation

## 2017-05-25 ENCOUNTER — Other Ambulatory Visit: Payer: Self-pay | Admitting: Family Medicine

## 2017-05-25 DIAGNOSIS — N50811 Right testicular pain: Secondary | ICD-10-CM

## 2017-05-25 MED ORDER — OSELTAMIVIR PHOSPHATE 75 MG PO CAPS: 75.0000 mg | ORAL_CAPSULE | Freq: Every day | ORAL | 0 refills | Status: DC

## 2017-05-27 ENCOUNTER — Ambulatory Visit (INDEPENDENT_AMBULATORY_CARE_PROVIDER_SITE_OTHER): Payer: Medicare HMO | Admitting: Urology

## 2017-05-27 ENCOUNTER — Other Ambulatory Visit: Payer: Self-pay | Admitting: Family Medicine

## 2017-05-27 ENCOUNTER — Encounter: Payer: Self-pay | Admitting: Urology

## 2017-05-27 VITALS — BP 137/69 | HR 76 | Ht 66.0 in | Wt 204.0 lb

## 2017-05-27 DIAGNOSIS — N50811 Right testicular pain: Secondary | ICD-10-CM

## 2017-05-27 DIAGNOSIS — N451 Epididymitis: Secondary | ICD-10-CM

## 2017-05-27 DIAGNOSIS — N50819 Testicular pain, unspecified: Secondary | ICD-10-CM | POA: Diagnosis not present

## 2017-05-27 LAB — URINALYSIS, COMPLETE
BILIRUBIN UA: NEGATIVE
KETONES UA: NEGATIVE
Leukocytes, UA: NEGATIVE
Nitrite, UA: NEGATIVE
PROTEIN UA: NEGATIVE
SPEC GRAV UA: 1.015 (ref 1.005–1.030)
UUROB: 0.2 mg/dL (ref 0.2–1.0)
pH, UA: 5.5 (ref 5.0–7.5)

## 2017-05-27 LAB — MICROSCOPIC EXAMINATION: RBC MICROSCOPIC, UA: NONE SEEN /HPF (ref 0–?)

## 2017-05-27 MED ORDER — DOXYCYCLINE HYCLATE 100 MG PO CAPS: 100.0000 mg | ORAL_CAPSULE | Freq: Two times a day (BID) | ORAL | 0 refills | Status: DC

## 2017-05-27 NOTE — Progress Notes (Signed)
05/27/2017 11:14 AM   Benjamin Chan 1945-03-25 [id number]  Referring provider: Steele Sizer, MD [address] [place], [place] [address]  Chief Complaint  Patient presents with  . Testicle Pain    New Patient    HPI: 72 year old male who presents today for further evaluation of bilateral testicular pain.  He reports that after the past several months, he has had intermittent testicular pain, R>L.  He describes the pain is primarily dull and achy but can be sharp and excruciating intermittently.  This is worse when he wakes up in the morning and seems to fade throughout the day.    He denies any associated voiding symptoms.  He feels like he empties his bladder without dysuria.  No history of UTIs.  He did undergo cardiac stent placement via right groin access approximately 2 months ago.  He reports that his pain started prior to this.  Been seen and evaluated the emergency room on 04/24/2017 for the same issue as well as seen by his PCP on 05/18/2017.    He is undergone further imaging in the form of a scrotal ultrasound on 05/22/2017.  This revealed no scrotal pathology.  There is an incidental 4 mm right epididymal cyst and bilateral small hydroceles, otherwise unremarkable.  No previous history of testicular pain.  No history of inguinal hernia.  Accompanied today by his wife and 56-1/2-year-old granddaughter.  PMH: Past Medical History:  Diagnosis Date  . BPH (benign prostatic hyperplasia)   . CAD (coronary artery disease)   . Chronic kidney disease   . COPD (chronic obstructive pulmonary disease) (HCC)   . Diabetes mellitus without complication (HCC)   . Hypertension   . MI (myocardial infarction) (HCC) 03/2017    Surgical History: Past Surgical History:  Procedure Laterality Date  . CORONARY ARTERY BYPASS GRAFT    . CORONARY STENT INTERVENTION N/A 03/30/2017   Procedure: CORONARY STENT INTERVENTION;  Surgeon: Alwyn Pea, MD;  Location: [facility] INVASIVE CV  LAB;  Service: Cardiovascular;  Laterality: N/A;  . INGUINAL HERNIA REPAIR     right  . LEFT HEART CATH AND CORS/GRAFTS ANGIOGRAPHY N/A 03/30/2017   Procedure: LEFT HEART CATH AND CORS/GRAFTS ANGIOGRAPHY;  Surgeon: Lamar Blinks, MD;  Location: [facility] INVASIVE CV LAB;  Service: Cardiovascular;  Laterality: N/A;    Home Medications:  Allergies as of 05/27/2017   No Known Allergies     Medication List        Accurate as of 05/27/17 11:14 AM. Always use your most recent med list.          ACCU-CHEK FASTCLIX LANCETS Misc Use one lancet to check blood sugars between 1-4 times daily   acetaminophen 325 MG tablet Commonly known as:  TYLENOL Take 650 mg by mouth every 6 (six) hours as needed.   albuterol 108 (90 Base) MCG/ACT inhaler Commonly known as:  PROVENTIL HFA;VENTOLIN HFA Inhale 2 puffs into the lungs every 6 (six) hours as needed for wheezing or shortness of breath.   amLODipine 10 MG tablet Commonly known as:  NORVASC Take 10 mg by mouth daily.   aspirin 81 MG tablet Take 81 mg by mouth daily.   atorvastatin 40 MG tablet Commonly known as:  LIPITOR Take 1 tablet (40 mg total) by mouth daily.   B-D SINGLE USE SWABS REGULAR Pads Use one swab each time blood sugar is checked to clean site prior to lancet use   budesonide-formoterol 160-4.5 MCG/ACT inhaler Commonly known as:  SYMBICORT  Inhale 2 puffs into the lungs 2 (two) times daily.   doxazosin 2 MG tablet Commonly known as:  CARDURA Take 1 tablet (2 mg total) by mouth daily.   enalapril 20 MG tablet Commonly known as:  VASOTEC Take 1 tablet (20 mg total) by mouth daily.   finasteride 5 MG tablet Commonly known as:  PROSCAR Take 1 tablet (5 mg total) by mouth daily.   fluticasone 50 MCG/ACT nasal spray Commonly known as:  FLONASE Place into both nostrils daily. Pt takes twice a day   glucose blood test strip Commonly known as:  ACCU-CHEK AVIVA Use as instructed   insulin NPH Human 100 UNIT/ML  injection Commonly known as:  HUMULIN N,NOVOLIN N Inject 35 Units into the skin 2 (two) times daily before a meal. 35 units twice a day.   nitroGLYCERIN 0.4 MG SL tablet Commonly known as:  NITROSTAT Place 0.4 mg under the tongue.   oseltamivir 75 MG capsule Commonly known as:  TAMIFLU Take 1 capsule (75 mg total) by mouth daily.   ticagrelor 90 MG Tabs tablet Commonly known as:  BRILINTA Take 1 tablet (90 mg total) by mouth 2 (two) times daily.   tiotropium 18 MCG inhalation capsule Commonly known as:  SPIRIVA Place 1 capsule (18 mcg total) into inhaler and inhale daily.       Allergies: No Known Allergies  Family History: Family History  Problem Relation Age of Onset  . Hypertension Father   . Hypertension Mother   . Hypertension Sister   . Hypertension Brother     Social History:  reports that he has quit smoking. He quit after 40.00 years of use. he has never used smokeless tobacco. He reports that he does not drink alcohol or use drugs.  ROS: UROLOGY Frequent Urination?: Yes Hard to postpone urination?: No Burning/pain with urination?: No Get up at night to urinate?: No Leakage of urine?: No Urine stream starts and stops?: No Trouble starting stream?: No Do you have to strain to urinate?: No Blood in urine?: No Urinary tract infection?: No Sexually transmitted disease?: No Injury to kidneys or bladder?: No Painful intercourse?: No Weak stream?: No Erection problems?: No Penile pain?: No  Gastrointestinal Nausea?: No Vomiting?: No Indigestion/heartburn?: No Diarrhea?: No Constipation?: No  Constitutional Fever: No Night sweats?: No Weight loss?: No Fatigue?: Yes  Skin Skin rash/lesions?: No Itching?: No  Eyes Blurred vision?: No Double vision?: No  Ears/Nose/Throat Sore throat?: No Sinus problems?: No  Hematologic/Lymphatic Swollen glands?: No Easy bruising?: No  Cardiovascular Leg swelling?: Yes Chest pain?:  Yes  Respiratory Cough?: Yes Shortness of breath?: Yes  Endocrine Excessive thirst?: No  Musculoskeletal Back pain?: No Joint pain?: Yes  Neurological Headaches?: Yes Dizziness?: No  Psychologic Depression?: No Anxiety?: No  Physical Exam: BP 137/69   Pulse 76   Ht 5\' 6"  (1.676 m)   Wt 204 lb (92.5 kg)   BMI 32.93 kg/m   Constitutional:  Alert and oriented, No acute distress. HEENT: [place] AT, moist mucus membranes.  Trachea midline, no masses. Cardiovascular: No clubbing, cyanosis, or edema. Respiratory: Normal respiratory effort, no increased work of breathing. GI: Abdomen is soft, nontender, nondistended, no abdominal masses.  Obese.  Umbilical hernia appreciated.  No palpable inguinal hernias appreciated. GU: Normal phallus with orthotopic meatus.  Bilateral descended testicles, normal without masses, tenderness.  Normal epididymis bilaterally with focal tenderness with palpation. Lymph: No inguinal lymphadenopathy. Skin: No rashes, bruises or suspicious lesions. Neurologic: Grossly intact, no focal deficits, moving  all 4 extremities. Psychiatric: Normal mood and affect.  Laboratory Data: Lab Results  Component Value Date   WBC 10.6 04/24/2017   HGB 13.7 04/24/2017   HCT 40.9 04/24/2017   MCV 88.9 04/24/2017   PLT 269 04/24/2017    Lab Results  Component Value Date   CREATININE 1.67 (H) 04/24/2017     Lab Results  Component Value Date   HGBA1C 12.2 (H) 03/27/2017    Urinalysis Results for orders placed or performed in visit on 05/27/17  Microscopic Examination  Result Value Ref Range   WBC, UA 0-5 0 - 5 /hpf   RBC, UA None seen 0 - 2 /hpf   Epithelial Cells (non renal) 0-10 0 - 10 /hpf   Bacteria, UA Moderate (A) None seen/Few  Urinalysis, Complete  Result Value Ref Range   Specific Gravity, UA 1.015 1.005 - 1.030   pH, UA 5.5 5.0 - 7.5   Color, UA Yellow Yellow   Appearance Ur Cloudy (A) Clear   Leukocytes, UA Negative Negative   Protein, UA  Negative Negative/Trace   Glucose, UA 3+ (A) Negative   Ketones, UA Negative Negative   RBC, UA Trace (A) Negative   Bilirubin, UA Negative Negative   Urobilinogen, Ur 0.2 0.2 - 1.0 mg/dL   Nitrite, UA Negative Negative   Microscopic Examination See below:      Pertinent Imaging: Show images for US SCROTUM W/DOPPLER  Study Result   CLINICAL DATA:  Intermittent right testicular pain for 3 months with a flare over the past week.  EXAM: SCROTAL ULTRASOUND  DOPPLER ULTRASOUND OF THE TESTICLES  TECHNIQUE: Complete ultrasound examination of the testicles, epididymis, and other scrotal structures was performed. Color and spectral Doppler ultrasound were also utilized to evaluate blood flow to the testicles.  COMPARISON:  None.  FINDINGS: Right testicle  Measurements: 3.7 x 2.3 x 2.9 cm. No mass or microlithiasis visualized.  Left testicle  Measurements: 3.9 x 2.3 x 2.6 cm. No mass or microlithiasis visualized.  Right epididymis: 0.4 cm simple cyst noted. Otherwise unremarkable.  Left epididymis:  Normal in size and appearance.  Hydrocele:  Small bilateral.  Varicocele:  None visualized.  Pulsed Doppler interrogation of both testes demonstrates normal low resistance arterial and venous waveforms bilaterally.  IMPRESSION: No acute abnormality or finding to explain the patient's symptoms.  Simple 4 mm cyst in the right epididymis.  Small bilateral hydroceles.   Electronically Signed   By: Drusilla Kanner M.D.   On: 05/22/2017 15:10   RUS personally reviewed today.    Assessment & Plan:    1. Epididymitis Tenderness of bilateral epididymes Do not suspect bacterial infection but unable to tolerate NSAIDs Recommend scrotal support We will treat with doxycycline for presumed epididymitis but if this is not effective, no further abx Discussed that chronic testicular pain can be very difficult to control, would consider referral to  physical therapy if symptoms fail to resolve with the above intervention - Urinalysis, Complete  F/u prn  Vanna Scotland, MD  [facility] [address] [place], [place] [address] [phone]

## 2017-06-04 ENCOUNTER — Encounter: Payer: Self-pay | Admitting: *Deleted

## 2017-06-04 ENCOUNTER — Other Ambulatory Visit: Payer: Self-pay | Admitting: *Deleted

## 2017-06-04 NOTE — Patient Outreach (Signed)
Successful telephone encounter to Benjamin Chan, 72 year old male- follow up on current  Clinical status.  This RN CM followed pt for transition of care/hospitalization January 11-15,2019  For chest pain,NON STEMI, left heart cath done 03/30/17.  Transition of care program completed  03/30/17.   Spoke with pt, HIPAA identifiers verified.  Pt reports on last month's visit with Heart  MD- good report, no recent chest pain, sob at times,confirmed to see Heart MD again  06/10/17 (view in EMR)   Pt reports BP running 140/70, needs to get a new BP machine, HR Good, compliant with low Na+ diet,exercising.   Pt reports on recent visit with Urologist for testicular pain, put on medication for the pain- works.   Discussed with pt plan to discharge  [facility], no further case management needs to which pt agreed.   Plan:  As discussed with pt, plan to discharge from [facility]- close case.           As discussed with pt, plan to inform Dr. Dossie Arbour of discharge, send case closure               Letter.    Addendum:  Follow up call to pt, HIPAA identifiers verified.  Pt reports taking all medications as  Ordered.    Shayne Alken.   Tuck Dulworth RN CCM [facility]  [phone]

## 2017-06-10 DIAGNOSIS — I872 Venous insufficiency (chronic) (peripheral): Secondary | ICD-10-CM | POA: Diagnosis not present

## 2017-06-10 DIAGNOSIS — I10 Essential (primary) hypertension: Secondary | ICD-10-CM | POA: Diagnosis not present

## 2017-06-10 DIAGNOSIS — E782 Mixed hyperlipidemia: Secondary | ICD-10-CM | POA: Diagnosis not present

## 2017-06-10 DIAGNOSIS — I25810 Atherosclerosis of coronary artery bypass graft(s) without angina pectoris: Secondary | ICD-10-CM | POA: Diagnosis not present

## 2017-06-19 ENCOUNTER — Encounter: Payer: Self-pay | Admitting: Family Medicine

## 2017-06-19 ENCOUNTER — Ambulatory Visit (INDEPENDENT_AMBULATORY_CARE_PROVIDER_SITE_OTHER): Payer: Medicare HMO | Admitting: Family Medicine

## 2017-06-19 VITALS — BP 152/67 | HR 69 | Temp 98.3°F | Wt 222.2 lb

## 2017-06-19 DIAGNOSIS — N451 Epididymitis: Secondary | ICD-10-CM

## 2017-06-19 MED ORDER — TRAMADOL HCL 50 MG PO TABS: 50.0000 mg | ORAL_TABLET | Freq: Three times a day (TID) | ORAL | 0 refills | Status: DC | PRN

## 2017-06-19 NOTE — Progress Notes (Signed)
BP (!) 152/67 (BP Location: Right Arm, Patient Position: Sitting, Cuff Size: Normal)   Pulse 69   Temp 98.3 F (36.8 C) (Oral)   Wt 222 lb 3.2 oz (100.8 kg)   SpO2 96%   BMI 35.86 kg/m    Subjective:    Patient ID: Benjamin Chan, male    DOB: 1946/01/31, 72 y.o.   MRN: [id number]  HPI: Benjamin Chan is a 72 y.o. male  Chief Complaint  Patient presents with  . Testicle Pain    States it's been going on for a while. Patient states his "privates" hurt more in the morning.   Pt here today for persistent testicular pain from inflammatory epididymitis currently being followed by Urology. Completed a course of doxycycline to cover for bacterial infection, did not notice any improvement. The worst sxs are in the night and when he first wakes up, dull aching pain in testicles. Was given small supply of pain medication through Urology which helped on prn basis. Denies fevers, urinary sxs, redness, edema. Sxs no worse than before, just persistent.   Relevant past medical, surgical, family and social history reviewed and updated as indicated. Interim medical history since our last visit reviewed. Allergies and medications reviewed and updated.  Review of Systems  Per HPI unless specifically indicated above     Objective:    BP (!) 152/67 (BP Location: Right Arm, Patient Position: Sitting, Cuff Size: Normal)   Pulse 69   Temp 98.3 F (36.8 C) (Oral)   Wt 222 lb 3.2 oz (100.8 kg)   SpO2 96%   BMI 35.86 kg/m   Wt Readings from Last 3 Encounters:  06/19/17 222 lb 3.2 oz (100.8 kg)  05/27/17 204 lb (92.5 kg)  05/18/17 217 lb (98.4 kg)    Physical Exam  Constitutional: He is oriented to person, place, and time. He appears well-developed and well-nourished. No distress.  HENT:  Head: Atraumatic.  Eyes: Pupils are equal, round, and reactive to light. Conjunctivae are normal.  Neck: Normal range of motion. Neck supple.  Cardiovascular: Normal rate and normal heart sounds.    Pulmonary/Chest: Effort normal and breath sounds normal.  Genitourinary:  Genitourinary Comments: Pt declined re-examination with shared decision making given no changes in appearance or tenderness since Urology exam  Musculoskeletal: Normal range of motion.  Neurological: He is alert and oriented to person, place, and time.  Skin: Skin is warm and dry.  Psychiatric: He has a normal mood and affect. His behavior is normal.  Nursing note and vitals reviewed.   Results for orders placed or performed in visit on 05/27/17  Microscopic Examination  Result Value Ref Range   WBC, UA 0-5 0 - 5 /hpf   RBC, UA None seen 0 - 2 /hpf   Epithelial Cells (non renal) 0-10 0 - 10 /hpf   Bacteria, UA Moderate (A) None seen/Few  Urinalysis, Complete  Result Value Ref Range   Specific Gravity, UA 1.015 1.005 - 1.030   pH, UA 5.5 5.0 - 7.5   Color, UA Yellow Yellow   Appearance Ur Cloudy (A) Clear   Leukocytes, UA Negative Negative   Protein, UA Negative Negative/Trace   Glucose, UA 3+ (A) Negative   Ketones, UA Negative Negative   RBC, UA Trace (A) Negative   Bilirubin, UA Negative Negative   Urobilinogen, Ur 0.2 0.2 - 1.0 mg/dL   Nitrite, UA Negative Negative   Microscopic Examination See below:       Assessment &  Plan:   Problem List Items Addressed This Visit    None    Visit Diagnoses    Epididymitis    -  Primary   Discussed using pillow between legs during sleep to reduce pressure to area, small supply of tramadol for prn use, and close Urology f/u for next steps       Follow up plan: Return for Urology f/u.

## 2017-06-22 NOTE — Patient Instructions (Signed)
Follow up with Urology for next steps

## 2017-07-09 ENCOUNTER — Ambulatory Visit: Payer: Self-pay

## 2017-07-10 ENCOUNTER — Ambulatory Visit (INDEPENDENT_AMBULATORY_CARE_PROVIDER_SITE_OTHER): Payer: Medicare HMO

## 2017-07-10 ENCOUNTER — Ambulatory Visit (INDEPENDENT_AMBULATORY_CARE_PROVIDER_SITE_OTHER): Payer: Medicare HMO | Admitting: Family Medicine

## 2017-07-10 VITALS — BP 146/68 | HR 68 | Temp 98.0°F | Resp 16 | Ht 66.0 in | Wt 215.6 lb

## 2017-07-10 VITALS — BP 134/68 | HR 68 | Temp 98.0°F | Ht 66.0 in | Wt 215.6 lb

## 2017-07-10 DIAGNOSIS — J449 Chronic obstructive pulmonary disease, unspecified: Secondary | ICD-10-CM

## 2017-07-10 DIAGNOSIS — Z1159 Encounter for screening for other viral diseases: Secondary | ICD-10-CM

## 2017-07-10 DIAGNOSIS — E785 Hyperlipidemia, unspecified: Secondary | ICD-10-CM | POA: Diagnosis not present

## 2017-07-10 DIAGNOSIS — Z0000 Encounter for general adult medical examination without abnormal findings: Secondary | ICD-10-CM

## 2017-07-10 DIAGNOSIS — E119 Type 2 diabetes mellitus without complications: Secondary | ICD-10-CM

## 2017-07-10 DIAGNOSIS — I10 Essential (primary) hypertension: Secondary | ICD-10-CM | POA: Diagnosis not present

## 2017-07-10 DIAGNOSIS — Z0001 Encounter for general adult medical examination with abnormal findings: Secondary | ICD-10-CM | POA: Diagnosis not present

## 2017-07-10 DIAGNOSIS — N400 Enlarged prostate without lower urinary tract symptoms: Secondary | ICD-10-CM | POA: Diagnosis not present

## 2017-07-10 DIAGNOSIS — R8290 Unspecified abnormal findings in urine: Secondary | ICD-10-CM | POA: Diagnosis not present

## 2017-07-10 DIAGNOSIS — L608 Other nail disorders: Secondary | ICD-10-CM

## 2017-07-10 NOTE — Progress Notes (Signed)
BP 134/68   Pulse 68   Temp 98 F (36.7 C) (Oral)   Ht 5\' 6"  (1.676 m)   Wt 215 lb 9.6 oz (97.8 kg)   SpO2 93%   BMI 34.80 kg/m    Subjective:    Patient ID: Benjamin Chan, male    DOB: 25-Oct-1945, 72 y.o.   MRN: [id number]  HPI: Benjamin Chan is a 72 y.o. male presenting on 07/10/2017 for comprehensive medical examination. Current medical complaints include:see below  Does not check home BPs. Recent Cardiology follow up, was stable there on current regimen. Denies Cp, HAs, SOB, dizziness, visual changes.  BS's - 130s-200 fasting in the mornings. No low blood sugar spells. Taking insulin NPH 35 units BID.   Will schedule [facility] exam at [facility].   Thinks he's had a pneumonia vaccine already, will try and get [place] records for that.   Struggling to cut his toenails, wants referral. States he's cut himself before trying to care for them.   Depression Screen done today and results listed below:  Depression screen [facility] 2/9 07/10/2017 05/18/2017 04/27/2017 04/06/2017  Decreased Interest 0 0 0 0  Down, Depressed, Hopeless 0 0 0 0  PHQ - 2 Score 0 0 0 0    The patient does not have a history of falls. I did not complete a risk assessment for falls. A plan of care for falls was not documented.   Past Medical History:  Past Medical History:  Diagnosis Date  . BPH (benign prostatic hyperplasia)   . CAD (coronary artery disease)   . Chronic kidney disease   . COPD (chronic obstructive pulmonary disease) (HCC)   . Diabetes mellitus without complication (HCC)   . Hypertension   . MI (myocardial infarction) (HCC) 03/2017    Surgical History:  Past Surgical History:  Procedure Laterality Date  . CORONARY ARTERY BYPASS GRAFT    . CORONARY STENT INTERVENTION N/A 03/30/2017   Procedure: CORONARY STENT INTERVENTION;  Surgeon: Alwyn Pea, MD;  Location: [facility] INVASIVE CV LAB;  Service: Cardiovascular;  Laterality: N/A;  . INGUINAL HERNIA REPAIR     right  . LEFT HEART  CATH AND CORS/GRAFTS ANGIOGRAPHY N/A 03/30/2017   Procedure: LEFT HEART CATH AND CORS/GRAFTS ANGIOGRAPHY;  Surgeon: Lamar Blinks, MD;  Location: [facility] INVASIVE CV LAB;  Service: Cardiovascular;  Laterality: N/A;    Medications:  Current Outpatient Medications on File Prior to Visit  Medication Sig  . enalapril (VASOTEC) 20 MG tablet Take 20 mg by mouth 2 (two) times daily.  [name] ACCU-CHEK FASTCLIX LANCETS MISC Use one lancet to check blood sugars between 1-4 times daily  . acetaminophen (TYLENOL) 325 MG tablet Take 650 mg by mouth every 6 (six) hours as needed.  [name] albuterol (PROVENTIL HFA;VENTOLIN HFA) 108 (90 Base) MCG/ACT inhaler Inhale 2 puffs into the lungs every 6 (six) hours as needed for wheezing or shortness of breath.  . Alcohol Swabs (B-D SINGLE USE SWABS REGULAR) PADS Use one swab each time blood sugar is checked to clean site prior to lancet use  . amLODipine (NORVASC) 10 MG tablet Take 10 mg by mouth daily.  [name] aspirin 81 MG tablet Take 81 mg by mouth daily.  [name] atorvastatin (LIPITOR) 40 MG tablet Take 1 tablet (40 mg total) by mouth daily.  . budesonide-formoterol (SYMBICORT) 160-4.5 MCG/ACT inhaler Inhale 2 puffs into the lungs 2 (two) times daily.  [name] doxazosin (CARDURA) 2 MG tablet Take 1 tablet (2 mg total) by  mouth daily.  . finasteride (PROSCAR) 5 MG tablet Take 1 tablet (5 mg total) by mouth daily.  . fluticasone (FLONASE) 50 MCG/ACT nasal spray Place into both nostrils daily. Pt takes twice a day  . glucose blood (ACCU-CHEK AVIVA) test strip Use as instructed  . insulin NPH Human (HUMULIN N,NOVOLIN N) 100 UNIT/ML injection Inject 35 Units into the skin 2 (two) times daily before a meal. 35 units twice a day.    . metoprolol tartrate (LOPRESSOR) 25 MG tablet   . Multiple Vitamin (MULTIVITAMIN) capsule Take 1 capsule by mouth daily.  . nitroGLYCERIN (NITROSTAT) 0.4 MG SL tablet Place 0.4 mg under the tongue.  . ticagrelor (BRILINTA) 90 MG TABS tablet Take 1 tablet (90 mg  total) by mouth 2 (two) times daily.  [name] tiotropium (SPIRIVA) 18 MCG inhalation capsule Place 1 capsule (18 mcg total) into inhaler and inhale daily.  . traMADol (ULTRAM) 50 MG tablet Take 1 tablet (50 mg total) by mouth every 8 (eight) hours as needed.   No current facility-administered medications on file prior to visit.     Allergies:  No Known Allergies  Social History:  Social History   Socioeconomic History  . Marital status: Married    Spouse name: Not on file  . Number of children: Not on file  . Years of education: Not on file  . Highest education level: Not on file  Occupational History  . Occupation: retired  Engineer, production  . Financial resource strain: Not hard at all  . Food insecurity:    Worry: Never true    Inability: Never true  . Transportation needs:    Medical: No    Non-medical: No  Tobacco Use  . Smoking status: Former Smoker    Years: 40.00  . Smokeless tobacco: Never Used  . Tobacco comment: quit about 5 years ago   Substance and Sexual Activity  . Alcohol use: No  . Drug use: No  . Sexual activity: Not on file  Lifestyle  . Physical activity:    Days per week: 0 days    Minutes per session: 0 min  . Stress: Not at all  Relationships  . Social connections:    Talks on phone: More than three times a week    Gets together: Twice a week    Attends religious service: More than 4 times per year    Active member of club or organization: No    Attends meetings of clubs or organizations: Never    Relationship status: Married  . Intimate partner violence:    Fear of current or ex partner: No    Emotionally abused: No    Physically abused: No    Forced sexual activity: No  Other Topics Concern  . Not on file  Social History Narrative  . Not on file   Social History   Tobacco Use  Smoking Status Former Smoker  . Years: 40.00  Smokeless Tobacco Never Used  Tobacco Comment   quit about 5 years ago    Social History   Substance and Sexual  Activity  Alcohol Use No    Family History:  Family History  Problem Relation Age of Onset  . Hypertension Father   . Hypertension Mother   . Hypertension Sister   . Hypertension Brother   . Hypertension Brother   . Hypertension Brother   . Hypertension Brother   . Hypertension Brother   . Hypertension Sister   . Hypertension Sister   .  Hypertension Sister   . Hypertension Sister   . Hypertension Sister     Past medical history, surgical history, medications, allergies, family history and social history reviewed with patient today and changes made to appropriate areas of the chart.   Review of Systems - General ROS: negative Psychological ROS: negative Ophthalmic ROS: negative ENT ROS: negative Endocrine ROS: negative Respiratory ROS: no cough, shortness of breath, or wheezing Cardiovascular ROS: no chest pain or dyspnea on exertion Gastrointestinal ROS: no abdominal pain, change in bowel habits, or black or bloody stools Genito-Urinary ROS: no dysuria, trouble voiding, or hematuria Musculoskeletal ROS: negative Neurological ROS: no TIA or stroke symptoms Dermatological ROS: negative All other ROS negative except what is listed above and in the HPI.      Objective:    BP 134/68   Pulse 68   Temp 98 F (36.7 C) (Oral)   Ht 5\' 6"  (1.676 m)   Wt 215 lb 9.6 oz (97.8 kg)   SpO2 93%   BMI 34.80 kg/m   Wt Readings from Last 3 Encounters:  07/10/17 215 lb 9.6 oz (97.8 kg)  07/10/17 215 lb 9.6 oz (97.8 kg)  06/19/17 222 lb 3.2 oz (100.8 kg)    Physical Exam  Constitutional: He is oriented to person, place, and time. He appears well-developed and well-nourished. No distress.  HENT:  Head: Atraumatic.  Right Ear: External ear normal.  Left Ear: External ear normal.  Nose: Nose normal.  Mouth/Throat: Oropharynx is clear and moist.  Eyes: Pupils are equal, round, and reactive to light. Conjunctivae are normal. No scleral icterus.  Neck: Normal range of motion. Neck  supple.  Cardiovascular: Normal rate, normal heart sounds and intact distal pulses.  No murmur heard. Pulmonary/Chest: Effort normal and breath sounds normal. No respiratory distress.  Abdominal: Soft. Bowel sounds are normal. He exhibits no distension and no mass. There is no tenderness. There is no guarding.  Genitourinary:  Genitourinary Comments: GU exam done through Urology  Musculoskeletal: Normal range of motion. He exhibits no edema or tenderness.  Neurological: He is alert and oriented to person, place, and time. He has normal reflexes.  Skin: Skin is warm and dry. No rash noted.  Hypertrophic toenails  Psychiatric: He has a normal mood and affect. His behavior is normal.  Nursing note and vitals reviewed.   Diabetic Foot Exam - Simple   Simple Foot Form Visual Inspection No deformities, no ulcerations, no other skin breakdown bilaterally:  Yes Sensation Testing Intact to touch and monofilament testing bilaterally:  Yes Pulse Check Posterior Tibialis and Dorsalis pulse intact bilaterally:  Yes Comments     Results for orders placed or performed in visit on 07/10/17  Microscopic Examination  Result Value Ref Range   WBC, UA >30 (A) 0 - 5 /hpf   RBC, UA 0-2 0 - 2 /hpf   Epithelial Cells (non renal) 0-10 0 - 10 /hpf   Renal Epithel, UA 0-10 (A) None seen /hpf   Bacteria, UA Many (A) None seen/Few  Urine Culture, Reflex  Result Value Ref Range   Urine Culture, Routine WILL FOLLOW   CBC with Differential/Platelet  Result Value Ref Range   WBC 9.2 3.4 - 10.8 x10E3/uL   RBC 4.70 4.14 - 5.80 x10E6/uL   Hemoglobin 13.9 13.0 - 17.7 g/dL   Hematocrit 01.0 07.1 - 51.0 %   MCV 84 79 - 97 fL   MCH 29.6 26.6 - 33.0 pg   MCHC 35.2 31.5 - 35.7 g/dL  RDW 13.2 12.3 - 15.4 %   Platelets 330 150 - 379 x10E3/uL   Neutrophils 62 Not Estab. %   Lymphs 28 Not Estab. %   Monocytes 7 Not Estab. %   Eos 2 Not Estab. %   Basos 1 Not Estab. %   Neutrophils Absolute 5.7 1.4 - 7.0  x10E3/uL   Lymphocytes Absolute 2.6 0.7 - 3.1 x10E3/uL   Monocytes Absolute 0.6 0.1 - 0.9 x10E3/uL   EOS (ABSOLUTE) 0.2 0.0 - 0.4 x10E3/uL   Basophils Absolute 0.1 0.0 - 0.2 x10E3/uL   Immature Granulocytes 0 Not Estab. %   Immature Grans (Abs) 0.0 0.0 - 0.1 x10E3/uL  Comprehensive metabolic panel  Result Value Ref Range   Glucose 151 (H) 65 - 99 mg/dL   BUN 21 8 - 27 mg/dL   Creatinine, Ser 1.61 (H) 0.76 - 1.27 mg/dL   GFR calc non Af Amer 44 (L) >59 mL/min/1.73   GFR calc Af Amer 51 (L) >59 mL/min/1.73   BUN/Creatinine Ratio 14 10 - 24   Sodium 139 134 - 144 mmol/L   Potassium 4.0 3.5 - 5.2 mmol/L   Chloride 99 96 - 106 mmol/L   CO2 22 20 - 29 mmol/L   Calcium 8.9 8.6 - 10.2 mg/dL   Total Protein 6.7 6.0 - 8.5 g/dL   Albumin 3.7 3.5 - 4.8 g/dL   Globulin, Total 3.0 1.5 - 4.5 g/dL   Albumin/Globulin Ratio 1.2 1.2 - 2.2   Bilirubin Total 0.5 0.0 - 1.2 mg/dL   Alkaline Phosphatase 115 39 - 117 IU/L   AST 20 0 - 40 IU/L   ALT 16 0 - 44 IU/L  Lipid Panel w/o Chol/HDL Ratio  Result Value Ref Range   Cholesterol, Total 184 100 - 199 mg/dL   Triglycerides 096 0 - 149 mg/dL   HDL 36 (L) >04 mg/dL   VLDL Cholesterol Cal 20 5 - 40 mg/dL   LDL Calculated 540 (H) 0 - 99 mg/dL  HgB J8J  Result Value Ref Range   Hgb A1c MFr Bld 11.2 (H) 4.8 - 5.6 %   Est. average glucose Bld gHb Est-mCnc 275 mg/dL  UA/M w/rflx Culture, Routine  Result Value Ref Range   Specific Gravity, UA 1.020 1.005 - 1.030   pH, UA 5.0 5.0 - 7.5   Color, UA Yellow Yellow   Appearance Ur Turbid (A) Clear   Leukocytes, UA 2+ (A) Negative   Protein, UA 1+ (A) Negative/Trace   Glucose, UA Trace (A) Negative   Ketones, UA Negative Negative   RBC, UA Trace (A) Negative   Bilirubin, UA Negative Negative   Urobilinogen, Ur 1.0 0.2 - 1.0 mg/dL   Nitrite, UA Positive (A) Negative   Microscopic Examination See below:    Urinalysis Reflex Comment       Assessment & Plan:   Problem List Items Addressed This Visit       Cardiovascular and Mediastinum   Essential hypertension - Primary    Stable and WNL for most part, followed by Cardiology. Continue current regimen. No recent bradycardia      Relevant Medications   enalapril (VASOTEC) 20 MG tablet   Other Relevant Orders   CBC with Differential/Platelet (Completed)     Respiratory   COPD (chronic obstructive pulmonary disease) (HCC)    Under fairly good control. Continue current regimen        Endocrine   Diabetes mellitus without complication (HCC)    Await A1C results, will adjust  regimen as needed. Encouraged continued efforts at diet and exercise improvement      Relevant Medications   enalapril (VASOTEC) 20 MG tablet   Other Relevant Orders   Comprehensive metabolic panel (Completed)   HgB A1c (Completed)   UA/M w/rflx Culture, Routine (Completed)     Genitourinary   BPH (benign prostatic hyperplasia)    Followed by Urology, on proscar with some relief. Continue current regimen        Other   Hyperlipidemia    Await lipid results, continue current regimen as well as dietary modifications      Relevant Medications   enalapril (VASOTEC) 20 MG tablet   Other Relevant Orders   Lipid Panel w/o Chol/HDL Ratio (Completed)    Other Visit Diagnoses    Annual physical exam       Toenail deformity       Podiatry referral placed for diabetic toenail care as he feels unable to continue caring for them and has cut himself multiple times   Relevant Orders   Ambulatory referral to Podiatry       Discussed aspirin prophylaxis for myocardial infarction prevention and decision was made to continue ASA  LABORATORY TESTING:  Health maintenance labs ordered today as discussed above.   The natural history of prostate cancer and ongoing controversy regarding screening and potential treatment outcomes of prostate cancer has been discussed with the patient. The meaning of a false positive PSA and a false negative PSA has been discussed. He  indicates understanding of the limitations of this screening test and wishes not to proceed with screening PSA testing.   IMMUNIZATIONS:   - Tdap: Tetanus vaccination status reviewed: postponed. - Influenza: Postponed to flu season - Pneumovax: UTD per pt, he will bring records - Prevnar: UTD per pt, he will bring records  SCREENING: - Colonoscopy: Up to date  Discussed with patient purpose of the colonoscopy is to detect colon cancer at curable precancerous or early stages   PATIENT COUNSELING:    Sexuality: Discussed sexually transmitted diseases, partner selection, use of condoms, avoidance of unintended pregnancy  and contraceptive alternatives.   Advised to avoid cigarette smoking.  I discussed with the patient that most people either abstain from alcohol or drink within safe limits (<=14/week and <=4 drinks/occasion for males, <=7/weeks and <= 3 drinks/occasion for females) and that the risk for alcohol disorders and other health effects rises proportionally with the number of drinks per week and how often a drinker exceeds daily limits.  Discussed cessation/primary prevention of drug use and availability of treatment for abuse.   Diet: Encouraged to adjust caloric intake to maintain  or achieve ideal body weight, to reduce intake of dietary saturated fat and total fat, to limit sodium intake by avoiding high sodium foods and not adding table salt, and to maintain adequate dietary potassium and calcium preferably from fresh fruits, vegetables, and low-fat dairy products.    stressed the importance of regular exercise  Injury prevention: Discussed safety belts, safety helmets, smoke detector, smoking near bedding or upholstery.   Dental health: Discussed importance of regular tooth brushing, flossing, and dental visits.   Follow up plan: NEXT PREVENTATIVE PHYSICAL DUE IN 1 YEAR. Return in about 6 months (around 01/09/2018) for 6 month f/u.

## 2017-07-10 NOTE — Patient Instructions (Addendum)
Benjamin Chan , Thank you for taking time to come for your Medicare Wellness Visit. I appreciate your ongoing commitment to your health goals. Please review the following plan we discussed and let me know if I can assist you in the future.   Screening recommendations/referrals: Colonoscopy: due 04/10/2018 Recommended yearly ophthalmology/optometry visit for glaucoma screening and checkup Recommended yearly dental visit for hygiene and checkup  Vaccinations: Influenza vaccine: due 11/2017 Pneumococcal vaccine: up to date, please bring a copy of your immunization records if you have them Tdap vaccine: due, check with your insurance company for coverage Shingles vaccine: due, check with your insurance company for coverage     Advanced directives: Advance directive discussed with you today. Even though you declined this today please call our office should you change your mind and we can give you the proper paperwork for you to fill out.  Conditions/risks identified: Recommend drinking at least 6-8 glasses of water a day  Next appointment: Follow up in one year for your annual wellness exam.   Preventive Care 74 Years and Older, Male Preventive care refers to lifestyle choices and visits with your health care provider that can promote health and wellness. What does preventive care include?  A yearly physical exam. This is also called an annual well check.  Dental exams once or twice a year.  Routine eye exams. Ask your health care provider how often you should have your eyes checked.  Personal lifestyle choices, including:  Daily care of your teeth and gums.  Regular physical activity.  Eating a healthy diet.  Avoiding tobacco and drug use.  Limiting alcohol use.  Practicing safe sex.  Taking low doses of aspirin every day.  Taking vitamin and mineral supplements as recommended by your health care provider. What happens during an annual well check? The services and  screenings done by your health care provider during your annual well check will depend on your age, overall health, lifestyle risk factors, and family history of disease. Counseling  Your health care provider may ask you questions about your:  Alcohol use.  Tobacco use.  Drug use.  Emotional well-being.  Home and relationship well-being.  Sexual activity.  Eating habits.  History of falls.  Memory and ability to understand (cognition).  Work and work Astronomer. Screening  You may have the following tests or measurements:  Height, weight, and BMI.  Blood pressure.  Lipid and cholesterol levels. These may be checked every 5 years, or more frequently if you are over 49 years old.  Skin check.  Lung cancer screening. You may have this screening every year starting at age 74 if you have a 30-pack-year history of smoking and currently smoke or have quit within the past 15 years.  Fecal occult blood test (FOBT) of the stool. You may have this test every year starting at age 74.  Flexible sigmoidoscopy or colonoscopy. You may have a sigmoidoscopy every 5 years or a colonoscopy every 10 years starting at age 74.  Prostate cancer screening. Recommendations will vary depending on your family history and other risks.  Hepatitis C blood test.  Hepatitis B blood test.  Sexually transmitted disease (STD) testing.  Diabetes screening. This is done by checking your blood sugar (glucose) after you have not eaten for a while (fasting). You may have this done every 1-3 years.  Abdominal aortic aneurysm (AAA) screening. You may need this if you are a current or former smoker.  Osteoporosis. You may be screened starting at age  74 if you are at high risk. Talk with your health care provider about your test results, treatment options, and if necessary, the need for more tests. Vaccines  Your health care provider may recommend certain vaccines, such as:  Influenza vaccine. This is  recommended every year.  Tetanus, diphtheria, and acellular pertussis (Tdap, Td) vaccine. You may need a Td booster every 10 years.  Zoster vaccine. You may need this after age 74.  Pneumococcal 13-valent conjugate (PCV13) vaccine. One dose is recommended after age 74.  Pneumococcal polysaccharide (PPSV23) vaccine. One dose is recommended after age 74. Talk to your health care provider about which screenings and vaccines you need and how often you need them. This information is not intended to replace advice given to you by your health care provider. Make sure you discuss any questions you have with your health care provider. Document Released: 03/30/2015 Document Revised: 11/21/2015 Document Reviewed: 01/02/2015 Elsevier Interactive Patient Education  2017 [place] Prevention in the Home Falls can cause injuries. They can happen to people of all ages. There are many things you can do to make your home safe and to help prevent falls. What can I do on the outside of my home?  Regularly fix the edges of walkways and driveways and fix any cracks.  Remove anything that might make you trip as you walk through a door, such as a raised step or threshold.  Trim any bushes or trees on the path to your home.  Use bright outdoor lighting.  Clear any walking paths of anything that might make someone trip, such as rocks or tools.  Regularly check to see if handrails are loose or broken. Make sure that both sides of any steps have handrails.  Any raised decks and porches should have guardrails on the edges.  Have any leaves, snow, or ice cleared regularly.  Use sand or salt on walking paths during winter.  Clean up any spills in your garage right away. This includes oil or grease spills. What can I do in the bathroom?  Use night lights.  Install grab bars by the toilet and in the tub and shower. Do not use towel bars as grab bars.  Use non-skid mats or decals in the tub or  shower.  If you need to sit down in the shower, use a plastic, non-slip stool.  Keep the floor dry. Clean up any water that spills on the floor as soon as it happens.  Remove soap buildup in the tub or shower regularly.  Attach bath mats securely with double-sided non-slip rug tape.  Do not have throw rugs and other things on the floor that can make you trip. What can I do in the bedroom?  Use night lights.  Make sure that you have a light by your bed that is easy to reach.  Do not use any sheets or blankets that are too big for your bed. They should not hang down onto the floor.  Have a firm chair that has side arms. You can use this for support while you get dressed.  Do not have throw rugs and other things on the floor that can make you trip. What can I do in the kitchen?  Clean up any spills right away.  Avoid walking on wet floors.  Keep items that you use a lot in easy-to-reach places.  If you need to reach something above you, use a strong step stool that has a grab bar.  Keep  electrical cords out of the way.  Do not use floor polish or wax that makes floors slippery. If you must use wax, use non-skid floor wax.  Do not have throw rugs and other things on the floor that can make you trip. What can I do with my stairs?  Do not leave any items on the stairs.  Make sure that there are handrails on both sides of the stairs and use them. Fix handrails that are broken or loose. Make sure that handrails are as long as the stairways.  Check any carpeting to make sure that it is firmly attached to the stairs. Fix any carpet that is loose or worn.  Avoid having throw rugs at the top or bottom of the stairs. If you do have throw rugs, attach them to the floor with carpet tape.  Make sure that you have a light switch at the top of the stairs and the bottom of the stairs. If you do not have them, ask someone to add them for you. What else can I do to help prevent  falls?  Wear shoes that:  Do not have high heels.  Have rubber bottoms.  Are comfortable and fit you well.  Are closed at the toe. Do not wear sandals.  If you use a stepladder:  Make sure that it is fully opened. Do not climb a closed stepladder.  Make sure that both sides of the stepladder are locked into place.  Ask someone to hold it for you, if possible.  Clearly mark and make sure that you can see:  Any grab bars or handrails.  First and last steps.  Where the edge of each step is.  Use tools that help you move around (mobility aids) if they are needed. These include:  Canes.  Walkers.  Scooters.  Crutches.  Turn on the lights when you go into a dark area. Replace any light bulbs as soon as they burn out.  Set up your furniture so you have a clear path. Avoid moving your furniture around.  If any of your floors are uneven, fix them.  If there are any pets around you, be aware of where they are.  Review your medicines with your doctor. Some medicines can make you feel dizzy. This can increase your chance of falling. Ask your doctor what other things that you can do to help prevent falls. This information is not intended to replace advice given to you by your health care provider. Make sure you discuss any questions you have with your health care provider. Document Released: 12/28/2008 Document Revised: 08/09/2015 Document Reviewed: 04/07/2014 Elsevier Interactive Patient Education  2017 [employer].  Your provider would like to you have your annual eye exam. Please contact your current eye doctor or here are some good options for you to contact.   [facility] Address: [address] [place], [place] [address]   Address: [address], [place], [place] [address]  Phone: [phone]      Phone: [phone]  Website: [email or web address]    Website: [email or web address]     [facility]  Address: [address] [place], [place], [place] [address]   Address: [address] [place], [place], [place] [address] Phone: [phone]      Phone: [phone]    [facility] Address: [address] [place], [place], [place] [address]  Phone: [phone]

## 2017-07-10 NOTE — Progress Notes (Signed)
Subjective:   Benjamin Chan is a 72 y.o. male who presents for Medicare Annual/Subsequent preventive examination.  Review of Systems:   Cardiac Risk Factors include: male gender;hypertension;advanced age (>31men, >43 women);diabetes mellitus;dyslipidemia;obesity (BMI >30kg/m2)     Objective:    Vitals: BP (!) 146/68 (BP Location: Left Arm, Patient Position: Sitting)   Pulse 68   Temp 98 F (36.7 C) (Temporal)   Resp 16   Ht 5\' 6"  (1.676 m)   Wt 215 lb 9.6 oz (97.8 kg)   SpO2 93%   BMI 34.80 kg/m   Body mass index is 34.8 kg/m.  Advanced Directives 07/10/2017 04/24/2017 04/01/2017 03/30/2017 03/27/2017 03/27/2017 06/29/2015  Does Patient Have a Medical Advance Directive? No No No Yes No No No  Type of Advance Directive - - - Healthcare Power of Attorney - - -  Does patient want to make changes to medical advance directive? - - No - Patient declined No - Patient declined - - -  Copy of Healthcare Power of Attorney in Chart? - - - No - copy requested - - -  Would patient like information on creating a medical advance directive? No - Patient declined No - Patient declined - No - Patient declined No - Patient declined No - Patient declined No - patient declined information    Tobacco Social History   Tobacco Use  Smoking Status Former Smoker  . Years: 40.00  Smokeless Tobacco Never Used  Tobacco Comment   quit about 5 years ago      Counseling given: Not Answered Comment: quit about 5 years ago    Clinical Intake:  Pre-visit preparation completed: Yes  Pain : No/denies pain Pain Score: 0-No pain     Nutritional Status: BMI > 30  Obese Nutritional Risks: None Diabetes: No  How often do you need to have someone help you when you read instructions, pamphlets, or other written materials from your doctor or pharmacy?: 4 - Often What is the last grade level you completed in school?: 5th grade  Interpreter Needed?: No  Information entered by :: Tiffany Hill,LPN    Past Medical History:  Diagnosis Date  . BPH (benign prostatic hyperplasia)   . CAD (coronary artery disease)   . Chronic kidney disease   . COPD (chronic obstructive pulmonary disease) (HCC)   . Diabetes mellitus without complication (HCC)   . Hypertension   . MI (myocardial infarction) (HCC) 03/2017   Past Surgical History:  Procedure Laterality Date  . CORONARY ARTERY BYPASS GRAFT    . CORONARY STENT INTERVENTION N/A 03/30/2017   Procedure: CORONARY STENT INTERVENTION;  Surgeon: Alwyn Pea, MD;  Location: [facility] INVASIVE CV LAB;  Service: Cardiovascular;  Laterality: N/A;  . INGUINAL HERNIA REPAIR     right  . LEFT HEART CATH AND CORS/GRAFTS ANGIOGRAPHY N/A 03/30/2017   Procedure: LEFT HEART CATH AND CORS/GRAFTS ANGIOGRAPHY;  Surgeon: Lamar Blinks, MD;  Location: [facility] INVASIVE CV LAB;  Service: Cardiovascular;  Laterality: N/A;   Family History  Problem Relation Age of Onset  . Hypertension Father   . Hypertension Mother   . Hypertension Sister   . Hypertension Brother   . Hypertension Brother   . Hypertension Brother   . Hypertension Brother   . Hypertension Brother   . Hypertension Sister   . Hypertension Sister   . Hypertension Sister   . Hypertension Sister   . Hypertension Sister    Social History   Socioeconomic History  . Marital status:  Married    Spouse name: Not on file  . Number of children: Not on file  . Years of education: Not on file  . Highest education level: Not on file  Occupational History  . Occupation: retired  Engineer, production  . Financial resource strain: Not hard at all  . Food insecurity:    Worry: Never true    Inability: Never true  . Transportation needs:    Medical: No    Non-medical: No  Tobacco Use  . Smoking status: Former Smoker    Years: 40.00  . Smokeless tobacco: Never Used  . Tobacco comment: quit about 5 years ago   Substance and Sexual Activity  . Alcohol use: No  . Drug use: No  . Sexual activity: Not  on file  Lifestyle  . Physical activity:    Days per week: 0 days    Minutes per session: 0 min  . Stress: Not at all  Relationships  . Social connections:    Talks on phone: More than three times a week    Gets together: Twice a week    Attends religious service: More than 4 times per year    Active member of club or organization: No    Attends meetings of clubs or organizations: Never    Relationship status: Married  Other Topics Concern  . Not on file  Social History Narrative  . Not on file    Outpatient Encounter Medications as of 07/10/2017  Medication Sig  . ACCU-CHEK FASTCLIX LANCETS MISC Use one lancet to check blood sugars between 1-4 times daily  . acetaminophen (TYLENOL) 325 MG tablet Take 650 mg by mouth every 6 (six) hours as needed.  [name] albuterol (PROVENTIL HFA;VENTOLIN HFA) 108 (90 Base) MCG/ACT inhaler Inhale 2 puffs into the lungs every 6 (six) hours as needed for wheezing or shortness of breath.  . Alcohol Swabs (B-D SINGLE USE SWABS REGULAR) PADS Use one swab each time blood sugar is checked to clean site prior to lancet use  . aspirin 81 MG tablet Take 81 mg by mouth daily.  [name] atorvastatin (LIPITOR) 40 MG tablet Take 1 tablet (40 mg total) by mouth daily.  . budesonide-formoterol (SYMBICORT) 160-4.5 MCG/ACT inhaler Inhale 2 puffs into the lungs 2 (two) times daily.  [name] doxazosin (CARDURA) 2 MG tablet Take 1 tablet (2 mg total) by mouth daily.  . enalapril (VASOTEC) 20 MG tablet Take 1 tablet (20 mg total) by mouth daily.  . finasteride (PROSCAR) 5 MG tablet Take 1 tablet (5 mg total) by mouth daily.  . fluticasone (FLONASE) 50 MCG/ACT nasal spray Place into both nostrils daily. Pt takes twice a day  . glucose blood (ACCU-CHEK AVIVA) test strip Use as instructed  . insulin NPH Human (HUMULIN N,NOVOLIN N) 100 UNIT/ML injection Inject 35 Units into the skin 2 (two) times daily before a meal. 35 units twice a day.    . metoprolol tartrate (LOPRESSOR) 25 MG tablet   .  Multiple Vitamin (MULTIVITAMIN) capsule Take 1 capsule by mouth daily.  . nitroGLYCERIN (NITROSTAT) 0.4 MG SL tablet Place 0.4 mg under the tongue.  . ticagrelor (BRILINTA) 90 MG TABS tablet Take 1 tablet (90 mg total) by mouth 2 (two) times daily.  [name] tiotropium (SPIRIVA) 18 MCG inhalation capsule Place 1 capsule (18 mcg total) into inhaler and inhale daily.  . traMADol (ULTRAM) 50 MG tablet Take 1 tablet (50 mg total) by mouth every 8 (eight) hours as needed.  [name] amLODipine (NORVASC)  10 MG tablet Take 10 mg by mouth daily.  . [DISCONTINUED] doxycycline (VIBRAMYCIN) 100 MG capsule Take 1 capsule (100 mg total) by mouth every 12 (twelve) hours. (Patient not taking: Reported on 07/10/2017)   No facility-administered encounter medications on file as of 07/10/2017.     Activities of Daily Living In your present state of health, do you have any difficulty performing the following activities: 07/10/2017 04/06/2017  Hearing? N N  Vision? N N  Difficulty concentrating or making decisions? N N  Walking or climbing stairs? N N  Dressing or bathing? N N  Doing errands, shopping? N N  Preparing Food and eating ? N N  Using the Toilet? N N  In the past six months, have you accidently leaked urine? N N  Do you have problems with loss of bowel control? N N  Managing your Medications? Malvin Johns  Comment sister manages  -  Managing your Finances? N Y  Housekeeping or managing your Housekeeping? N N  Some recent data might be hidden    Patient Care Team: Steele Sizer, MD as PCP - General (Family Medicine)   Assessment:   This is a routine wellness examination for Alexsander.  Exercise Activities and Dietary recommendations Current Exercise Habits: The patient has a physically strenous job, but has no regular exercise apart from work., Exercise limited by: None identified  Goals    . DIET - INCREASE WATER INTAKE     Recommend drinking at least 6-8 glasses of water a day       Fall Risk Fall Risk   07/10/2017 05/18/2017 04/27/2017 04/06/2017  Falls in the past year? No No No No   Is the patient's home free of loose throw rugs in walkways, pet beds, electrical cords, etc?   yes      Grab bars in the bathroom? no      Handrails on the stairs?   no stairs      Adequate lighting?   yes  Timed Get Up and Go Performed: Completed in 8 seconds with no use of assistive devices, steady gait. No intervention needed at this time.   Depression Screen PHQ 2/9 Scores 07/10/2017 05/18/2017 04/27/2017 04/06/2017  PHQ - 2 Score 0 0 0 0    Cognitive Function     6CIT Screen 07/10/2017  What Year? 4 points  What month? 3 points  What time? 0 points  Count back from 20 0 points  Months in reverse 0 points  Repeat phrase 2 points  Total Score 9     There is no immunization history on file for this patient.  Qualifies for Shingles Vaccine? Yes, discussed shingrix vaccine   Screening Tests Health Maintenance  Topic Date Due  . Hepatitis C Screening  1945/06/04  . FOOT EXAM  11/29/1955  . OPHTHALMOLOGY EXAM  11/29/1955  . PNA vac Low Risk Adult (1 of 2 - PCV13) 11/29/2008  . TETANUS/TDAP  07/11/2018 (Originally 11/30/1962)  . HEMOGLOBIN A1C  09/24/2017  . INFLUENZA VACCINE  10/15/2017  . COLONOSCOPY  04/10/2018   Cancer Screenings: Lung: Low Dose CT Chest recommended if Age 39-80 years, 30 pack-year currently smoking OR have quit w/in 15years. Patient does qualify. Colorectal: due 04/10/2018  Additional Screenings:  Hepatitis C Screening: ordered      Plan:    I have personally reviewed and addressed the Medicare Annual Wellness questionnaire and have noted the following in the patient's chart:  A. Medical and social history B. Use of  alcohol, tobacco or illicit drugs  C. Current medications and supplements D. Functional ability and status E.  Nutritional status F.  Physical activity G. Advance directives H. List of other physicians I.  Hospitalizations, surgeries, and ER visits in  previous 12 months J.  Vitals K. Screenings such as hearing and vision if needed, cognitive and depression L. Referrals and appointments   In addition, I have reviewed and discussed with patient certain preventive protocols, quality metrics, and best practice recommendations. A written personalized care plan for preventive services as well as general preventive health recommendations were provided to patient.   Signed,  Marin Roberts, LPN Nurse Health Advisor   Nurse Notes:none

## 2017-07-10 NOTE — Patient Instructions (Signed)
Enalapril 20 mg twice daily (use the bottle's directions that Dr. Gwen Pounds prescribed)

## 2017-07-11 LAB — CBC WITH DIFFERENTIAL/PLATELET
BASOS ABS: 0.1 10*3/uL (ref 0.0–0.2)
BASOS: 1 %
EOS (ABSOLUTE): 0.2 10*3/uL (ref 0.0–0.4)
Eos: 2 %
Hematocrit: 39.5 % (ref 37.5–51.0)
Hemoglobin: 13.9 g/dL (ref 13.0–17.7)
Immature Grans (Abs): 0 10*3/uL (ref 0.0–0.1)
Immature Granulocytes: 0 %
LYMPHS: 28 %
Lymphocytes Absolute: 2.6 10*3/uL (ref 0.7–3.1)
MCH: 29.6 pg (ref 26.6–33.0)
MCHC: 35.2 g/dL (ref 31.5–35.7)
MCV: 84 fL (ref 79–97)
MONOS ABS: 0.6 10*3/uL (ref 0.1–0.9)
Monocytes: 7 %
Neutrophils Absolute: 5.7 10*3/uL (ref 1.4–7.0)
Neutrophils: 62 %
PLATELETS: 330 10*3/uL (ref 150–379)
RBC: 4.7 x10E6/uL (ref 4.14–5.80)
RDW: 13.2 % (ref 12.3–15.4)
WBC: 9.2 10*3/uL (ref 3.4–10.8)

## 2017-07-11 LAB — LIPID PANEL W/O CHOL/HDL RATIO
CHOLESTEROL TOTAL: 184 mg/dL (ref 100–199)
HDL: 36 mg/dL — ABNORMAL LOW (ref >39)
LDL Calculated: 128 mg/dL — ABNORMAL HIGH (ref 0–99)
TRIGLYCERIDES: 100 mg/dL (ref 0–149)
VLDL Cholesterol Cal: 20 mg/dL (ref 5–40)

## 2017-07-11 LAB — COMPREHENSIVE METABOLIC PANEL
A/G RATIO: 1.2 (ref 1.2–2.2)
ALK PHOS: 115 IU/L (ref 39–117)
ALT: 16 IU/L (ref 0–44)
AST: 20 IU/L (ref 0–40)
Albumin: 3.7 g/dL (ref 3.5–4.8)
BILIRUBIN TOTAL: 0.5 mg/dL (ref 0.0–1.2)
BUN/Creatinine Ratio: 14 (ref 10–24)
BUN: 21 mg/dL (ref 8–27)
CHLORIDE: 99 mmol/L (ref 96–106)
CO2: 22 mmol/L (ref 20–29)
Calcium: 8.9 mg/dL (ref 8.6–10.2)
Creatinine, Ser: 1.54 mg/dL — ABNORMAL HIGH (ref 0.76–1.27)
GFR calc Af Amer: 51 mL/min/{1.73_m2} — ABNORMAL LOW (ref >59)
GFR calc non Af Amer: 44 mL/min/{1.73_m2} — ABNORMAL LOW (ref >59)
GLUCOSE: 151 mg/dL — AB (ref 65–99)
Globulin, Total: 3 g/dL (ref 1.5–4.5)
POTASSIUM: 4 mmol/L (ref 3.5–5.2)
Sodium: 139 mmol/L (ref 134–144)
Total Protein: 6.7 g/dL (ref 6.0–8.5)

## 2017-07-11 LAB — HEMOGLOBIN A1C
ESTIMATED AVERAGE GLUCOSE: 275 mg/dL
Hgb A1c MFr Bld: 11.2 % — ABNORMAL HIGH (ref 4.8–5.6)

## 2017-07-11 LAB — HEPATITIS C ANTIBODY

## 2017-07-13 NOTE — Assessment & Plan Note (Signed)
Under fairly good control. Continue current regimen

## 2017-07-13 NOTE — Assessment & Plan Note (Signed)
Followed by Urology, on proscar with some relief. Continue current regimen

## 2017-07-13 NOTE — Assessment & Plan Note (Signed)
Await lipid results, continue current regimen as well as dietary modifications

## 2017-07-13 NOTE — Assessment & Plan Note (Signed)
Stable and WNL for most part, followed by Cardiology. Continue current regimen. No recent bradycardia

## 2017-07-13 NOTE — Assessment & Plan Note (Signed)
Await A1C results, will adjust regimen as needed. Encouraged continued efforts at diet and exercise improvement

## 2017-07-14 ENCOUNTER — Telehealth: Payer: Self-pay | Admitting: Family Medicine

## 2017-07-14 MED ORDER — ATORVASTATIN CALCIUM 80 MG PO TABS: 80.0000 mg | ORAL_TABLET | Freq: Every day | ORAL | 3 refills | Status: DC

## 2017-07-14 MED ORDER — GLIPIZIDE 5 MG PO TABS: 5.0000 mg | ORAL_TABLET | Freq: Two times a day (BID) | ORAL | 3 refills | Status: DC

## 2017-07-14 MED ORDER — CIPROFLOXACIN HCL 250 MG PO TABS: 250.0000 mg | ORAL_TABLET | Freq: Two times a day (BID) | ORAL | 0 refills | Status: DC

## 2017-07-14 NOTE — Telephone Encounter (Signed)
Called and spoke both with both pt and his wife about lab results  - sending cipro over for a UTI, pt does state he's starting to have some sxs but it's hard to tell as he also has BPH. Will await cx on that. - pt endorses poor diet with lots of fried foods and potatoes but good compliance with his NPH. Will add glipizide and strongly encouraged better dietary intake. Pt not wanting to go to [facility] right now - LDL still above goal on 40 mg lipitor. WIll increase dose and recheck at next OV - Pt instructed to come back in 3 months for A1C recheck and then keep 6 month appt for full f/u

## 2017-07-16 ENCOUNTER — Other Ambulatory Visit: Payer: Self-pay | Admitting: Family Medicine

## 2017-07-16 LAB — UA/M W/RFLX CULTURE, ROUTINE
BILIRUBIN UA: NEGATIVE
KETONES UA: NEGATIVE
Nitrite, UA: POSITIVE — AB
SPEC GRAV UA: 1.02 (ref 1.005–1.030)
Urobilinogen, Ur: 1 mg/dL (ref 0.2–1.0)
pH, UA: 5 (ref 5.0–7.5)

## 2017-07-16 LAB — URINE CULTURE, REFLEX

## 2017-07-16 LAB — MICROSCOPIC EXAMINATION: WBC, UA: >30 /hpf — AB (ref 0–5)

## 2017-07-16 MED ORDER — TICAGRELOR 90 MG PO TABS: 90.0000 mg | ORAL_TABLET | Freq: Two times a day (BID) | ORAL | 2 refills | Status: DC

## 2017-07-16 NOTE — Telephone Encounter (Signed)
Rx refill request from outside provider: Brilinta 90 mg  LOV:07/10/17    PCP: Maurice March  Pharmacy:verified

## 2017-07-16 NOTE — Telephone Encounter (Signed)
Copied from CRM [id number]. Topic: Quick Communication - Rx Refill/Question >> Jul 16, 2017 10:51 AM Diana Eves B wrote: Medication: ticagrelor (BRILINTA) 90 MG TABS tablet Has the patient contacted their pharmacy? Yes.   (Agent: If no, request that the patient contact the pharmacy for the refill.) Preferred Pharmacy (with phone number or street name): [id number] - [place] ([place]), [place] - [address] Agent: Please be advised that RX refills may take up to 3 business days. We ask that you follow-up with your pharmacy.

## 2017-07-27 ENCOUNTER — Ambulatory Visit (INDEPENDENT_AMBULATORY_CARE_PROVIDER_SITE_OTHER): Payer: Medicare HMO | Admitting: Podiatry

## 2017-07-27 ENCOUNTER — Encounter: Payer: Self-pay | Admitting: Podiatry

## 2017-07-27 VITALS — BP 166/71 | HR 58

## 2017-07-27 DIAGNOSIS — E1159 Type 2 diabetes mellitus with other circulatory complications: Secondary | ICD-10-CM | POA: Diagnosis not present

## 2017-07-27 DIAGNOSIS — M79674 Pain in right toe(s): Secondary | ICD-10-CM

## 2017-07-27 DIAGNOSIS — B351 Tinea unguium: Secondary | ICD-10-CM | POA: Diagnosis not present

## 2017-07-27 DIAGNOSIS — M79675 Pain in left toe(s): Secondary | ICD-10-CM | POA: Diagnosis not present

## 2017-07-27 NOTE — Progress Notes (Signed)
Complaint:  Visit Type: Patient presents  to my office for  preventative foot care services. Complaint: Patient states" my nails have grown long and thick and become painful to walk and wear shoes" Patient has been diagnosed with DM with no foot complications. The patient presents for preventative foot care services. No changes to ROS  Podiatric Exam: Vascular: dorsalis pedis and posterior tibial pulses are not  palpable bilateral. Capillary return is immediate. Temperature gradient is WNL. Skin turgor WNL  Sensorium: Normal Semmes Weinstein monofilament test. Normal tactile sensation bilaterally. Nail Exam: Pt has thick disfigured discolored nails with subungual debris noted bilateral entire nail hallux through fifth toenails Ulcer Exam: There is no evidence of ulcer or pre-ulcerative changes or infection. Orthopedic Exam: Muscle tone and strength are WNL. No limitations in general ROM. No crepitus or effusions noted. Foot type and digits show no abnormalities. Bony prominences are unremarkable. Skin: No Porokeratosis. No infection or ulcers  Diagnosis:  Onychomycosis, , Pain in right toe, pain in left toes  Diabetes with angiopathy  Treatment & Plan Procedures and Treatment: Consent by patient was obtained for treatment procedures.   Debridement of mycotic and hypertrophic toenails, 1 through 5 bilateral and clearing of subungual debris. No ulceration, no infection noted.  Return Visit-Office Procedure: Patient instructed to return to the office for a follow up visit 3 months for continued evaluation and treatment.    Helane Gunther DPM

## 2017-08-13 ENCOUNTER — Telehealth: Payer: Self-pay | Admitting: Unknown Physician Specialty

## 2017-08-13 NOTE — Telephone Encounter (Signed)
Copied from CRM [id number]. Topic: General - Other >> Aug 12, 2017  6:13 PM Debroah Loop wrote: Reason for CRM: Patient's daughter Joni Reining calling to get an appt for patient who was told he needs an appt for prostate medication, however Dossie Arbour has no availability. Please call back to advise about working him in somehow as he is out of medication.  >> Aug 13, 2017  8:12 AM Adela Ports M wrote: Please advise.   Reviewed chart.  His Procar should have 1 year of refills.  #90 refilled on 04/16/17 with 3 refills.  Is there another medication he needs?

## 2017-08-13 NOTE — Telephone Encounter (Signed)
Called to speak to patient about medication. Both numbers listed stated, "This line is not accepting incoming calls at subscribers request". Emergency contact, wife, has same numbers listed. Daughter is not on [facility]

## 2017-08-13 NOTE — Telephone Encounter (Signed)
Called daughter and asked that she get a hold of father to have him contact us to discuss. Daughter wanted to discuss with her, advised that she was not his DRP and that he would need to sign a release so we could discuss with her. She stated she would have pt present to clinic later today to address and sign form.   I did contact Public affairs consultant. His Proscar was sent to him mail order on 07/08/17 and is not available for refill until 09/04/17. Patient should not need an appointment he was seen in April. Unsure who advise pt he needed to be seen or what other medication they could be discussing.

## 2017-08-13 NOTE — Telephone Encounter (Signed)
Yes, we need for info from call center about which medication as opposed to "prostate medication"

## 2017-08-24 ENCOUNTER — Ambulatory Visit (INDEPENDENT_AMBULATORY_CARE_PROVIDER_SITE_OTHER): Payer: Medicare HMO | Admitting: Unknown Physician Specialty

## 2017-08-24 ENCOUNTER — Encounter: Payer: Self-pay | Admitting: Unknown Physician Specialty

## 2017-08-24 DIAGNOSIS — E119 Type 2 diabetes mellitus without complications: Secondary | ICD-10-CM | POA: Diagnosis not present

## 2017-08-24 DIAGNOSIS — N400 Enlarged prostate without lower urinary tract symptoms: Secondary | ICD-10-CM | POA: Diagnosis not present

## 2017-08-24 MED ORDER — FINASTERIDE 5 MG PO TABS: 5.0000 mg | ORAL_TABLET | Freq: Every day | ORAL | 1 refills | Status: DC

## 2017-08-24 MED ORDER — DOXAZOSIN MESYLATE 2 MG PO TABS: 2.0000 mg | ORAL_TABLET | Freq: Every day | ORAL | 1 refills | Status: DC

## 2017-08-24 NOTE — Progress Notes (Signed)
BP 135/74 (BP Location: Left Arm, Cuff Size: Large)   Pulse 61   Temp 98.4 F (36.9 C) (Oral)   Ht 5\' 6"  (1.676 m)   Wt 215 lb 3.2 oz (97.6 kg)   SpO2 97%   BMI 34.73 kg/m    Subjective:    Patient ID: Benjamin Chan, male    DOB: Feb 15, 1946, 72 y.o.   MRN: [id number]  HPI: WENZEL BACKLUND is a 72 y.o. male  Chief Complaint  Patient presents with  . Medication Refill    doxazosin and furosemide   Pt is here for needing a refill of his prostate medications.  States he got them while going to a geriatric clinic in [place].  States today he is having trouble urinating.  Out of his medication for about a month  Chart review show pt sees cardiology. DM here. Last Hgb A1C was 11.2 on 4/26.  Taking 35 u of insulin NPH twice a day.  Recheck next month   Relevant past medical, surgical, family and social history reviewed and updated as indicated. Interim medical history since our last visit reviewed. Allergies and medications reviewed and updated.  Review of Systems  Per HPI unless specifically indicated above     Objective:    BP 135/74 (BP Location: Left Arm, Cuff Size: Large)   Pulse 61   Temp 98.4 F (36.9 C) (Oral)   Ht 5\' 6"  (1.676 m)   Wt 215 lb 3.2 oz (97.6 kg)   SpO2 97%   BMI 34.73 kg/m   Wt Readings from Last 3 Encounters:  08/24/17 215 lb 3.2 oz (97.6 kg)  07/10/17 215 lb 9.6 oz (97.8 kg)  07/10/17 215 lb 9.6 oz (97.8 kg)    Physical Exam  Constitutional: He is oriented to person, place, and time. He appears well-developed and well-nourished. No distress.  HENT:  Head: Normocephalic and atraumatic.  Eyes: Conjunctivae and lids are normal. Right eye exhibits no discharge. Left eye exhibits no discharge. No scleral icterus.  Neck: Normal range of motion. Neck supple. No JVD present. Carotid bruit is not present.  Cardiovascular: Normal rate, regular rhythm and normal heart sounds.  Pulmonary/Chest: Effort normal and breath sounds normal. No  respiratory distress.  Abdominal: Normal appearance. There is no splenomegaly or hepatomegaly.  Musculoskeletal: Normal range of motion.  Neurological: He is alert and oriented to person, place, and time.  Skin: Skin is warm, dry and intact. No rash noted. No pallor.  Psychiatric: He has a normal mood and affect. His behavior is normal. Judgment and thought content normal.    Results for orders placed or performed in visit on 07/10/17  Microscopic Examination  Result Value Ref Range   WBC, UA >30 (A) 0 - 5 /hpf   RBC, UA 0-2 0 - 2 /hpf   Epithelial Cells (non renal) 0-10 0 - 10 /hpf   Renal Epithel, UA 0-10 (A) None seen /hpf   Bacteria, UA Many (A) None seen/Few  Urine Culture, Reflex  Result Value Ref Range   Urine Culture, Routine Final report (A)    Organism ID, Bacteria Escherichia coli (A)    Antimicrobial Susceptibility Comment   CBC with Differential/Platelet  Result Value Ref Range   WBC 9.2 3.4 - 10.8 x10E3/uL   RBC 4.70 4.14 - 5.80 x10E6/uL   Hemoglobin 13.9 13.0 - 17.7 g/dL   Hematocrit 40.9 81.1 - 51.0 %   MCV 84 79 - 97 fL   MCH 29.6 26.6 -  33.0 pg   MCHC 35.2 31.5 - 35.7 g/dL   RDW 32.7 61.4 - 70.9 %   Platelets 330 150 - 379 x10E3/uL   Neutrophils 62 Not Estab. %   Lymphs 28 Not Estab. %   Monocytes 7 Not Estab. %   Eos 2 Not Estab. %   Basos 1 Not Estab. %   Neutrophils Absolute 5.7 1.4 - 7.0 x10E3/uL   Lymphocytes Absolute 2.6 0.7 - 3.1 x10E3/uL   Monocytes Absolute 0.6 0.1 - 0.9 x10E3/uL   EOS (ABSOLUTE) 0.2 0.0 - 0.4 x10E3/uL   Basophils Absolute 0.1 0.0 - 0.2 x10E3/uL   Immature Granulocytes 0 Not Estab. %   Immature Grans (Abs) 0.0 0.0 - 0.1 x10E3/uL  Comprehensive metabolic panel  Result Value Ref Range   Glucose 151 (H) 65 - 99 mg/dL   BUN 21 8 - 27 mg/dL   Creatinine, Ser 2.95 (H) 0.76 - 1.27 mg/dL   GFR calc non Af Amer 44 (L) >59 mL/min/1.73   GFR calc Af Amer 51 (L) >59 mL/min/1.73   BUN/Creatinine Ratio 14 10 - 24   Sodium 139 134 - 144  mmol/L   Potassium 4.0 3.5 - 5.2 mmol/L   Chloride 99 96 - 106 mmol/L   CO2 22 20 - 29 mmol/L   Calcium 8.9 8.6 - 10.2 mg/dL   Total Protein 6.7 6.0 - 8.5 g/dL   Albumin 3.7 3.5 - 4.8 g/dL   Globulin, Total 3.0 1.5 - 4.5 g/dL   Albumin/Globulin Ratio 1.2 1.2 - 2.2   Bilirubin Total 0.5 0.0 - 1.2 mg/dL   Alkaline Phosphatase 115 39 - 117 IU/L   AST 20 0 - 40 IU/L   ALT 16 0 - 44 IU/L  Lipid Panel w/o Chol/HDL Ratio  Result Value Ref Range   Cholesterol, Total 184 100 - 199 mg/dL   Triglycerides 747 0 - 149 mg/dL   HDL 36 (L) >34 mg/dL   VLDL Cholesterol Cal 20 5 - 40 mg/dL   LDL Calculated 037 (H) 0 - 99 mg/dL  HgB Q9U  Result Value Ref Range   Hgb A1c MFr Bld 11.2 (H) 4.8 - 5.6 %   Est. average glucose Bld gHb Est-mCnc 275 mg/dL  UA/M w/rflx Culture, Routine  Result Value Ref Range   Specific Gravity, UA 1.020 1.005 - 1.030   pH, UA 5.0 5.0 - 7.5   Color, UA Yellow Yellow   Appearance Ur Turbid (A) Clear   Leukocytes, UA 2+ (A) Negative   Protein, UA 1+ (A) Negative/Trace   Glucose, UA Trace (A) Negative   Ketones, UA Negative Negative   RBC, UA Trace (A) Negative   Bilirubin, UA Negative Negative   Urobilinogen, Ur 1.0 0.2 - 1.0 mg/dL   Nitrite, UA Positive (A) Negative   Microscopic Examination See below:    Urinalysis Reflex Comment       Assessment & Plan:   Problem List Items Addressed This Visit      Unprioritized   BPH (benign prostatic hyperplasia)    BPH.  Poor control today as out of medication provided by another clinic.  Will restart both Fiasteride and Doxazosin      Relevant Medications   finasteride (PROSCAR) 5 MG tablet   Diabetes mellitus without complication [facility])    Has appointment in July for f/u.            Follow up plan: Return for appt next month.

## 2017-08-24 NOTE — Assessment & Plan Note (Addendum)
BPH.  Poor control today as out of medication provided by another clinic.  Will restart both Fiasteride and Doxazosin

## 2017-08-24 NOTE — Assessment & Plan Note (Signed)
Has appointment in July for f/u.

## 2017-09-25 ENCOUNTER — Ambulatory Visit: Payer: Medicare HMO | Admitting: Unknown Physician Specialty

## 2017-10-29 ENCOUNTER — Ambulatory Visit: Payer: Self-pay | Admitting: Podiatry

## 2017-11-02 ENCOUNTER — Other Ambulatory Visit: Payer: Self-pay | Admitting: Family Medicine

## 2017-11-23 ENCOUNTER — Encounter: Payer: Self-pay | Admitting: Family Medicine

## 2017-11-23 ENCOUNTER — Ambulatory Visit (INDEPENDENT_AMBULATORY_CARE_PROVIDER_SITE_OTHER): Payer: Medicare HMO | Admitting: Family Medicine

## 2017-11-23 VITALS — BP 186/78 | HR 58 | Temp 98.7°F | Wt 213.1 lb

## 2017-11-23 DIAGNOSIS — N183 Chronic kidney disease, stage 3 unspecified: Secondary | ICD-10-CM

## 2017-11-23 DIAGNOSIS — E1129 Type 2 diabetes mellitus with other diabetic kidney complication: Secondary | ICD-10-CM | POA: Diagnosis not present

## 2017-11-23 DIAGNOSIS — J449 Chronic obstructive pulmonary disease, unspecified: Secondary | ICD-10-CM | POA: Diagnosis not present

## 2017-11-23 DIAGNOSIS — E1165 Type 2 diabetes mellitus with hyperglycemia: Secondary | ICD-10-CM | POA: Diagnosis not present

## 2017-11-23 DIAGNOSIS — E119 Type 2 diabetes mellitus without complications: Secondary | ICD-10-CM | POA: Diagnosis not present

## 2017-11-23 DIAGNOSIS — Z23 Encounter for immunization: Secondary | ICD-10-CM

## 2017-11-23 DIAGNOSIS — E1122 Type 2 diabetes mellitus with diabetic chronic kidney disease: Secondary | ICD-10-CM | POA: Diagnosis not present

## 2017-11-23 DIAGNOSIS — I129 Hypertensive chronic kidney disease with stage 1 through stage 4 chronic kidney disease, or unspecified chronic kidney disease: Secondary | ICD-10-CM

## 2017-11-23 DIAGNOSIS — IMO0002 Reserved for concepts with insufficient information to code with codable children: Secondary | ICD-10-CM

## 2017-11-23 DIAGNOSIS — J441 Chronic obstructive pulmonary disease with (acute) exacerbation: Secondary | ICD-10-CM

## 2017-11-23 LAB — BAYER DCA HB A1C WAIVED: HB A1C (BAYER DCA - WAIVED): 9.5 % — ABNORMAL HIGH (ref <7.0)

## 2017-11-23 MED ORDER — AMLODIPINE BESYLATE 10 MG PO TABS: 10.0000 mg | ORAL_TABLET | Freq: Every day | ORAL | 1 refills | Status: DC

## 2017-11-23 MED ORDER — AZITHROMYCIN 250 MG PO TABS: ORAL_TABLET | ORAL | 0 refills | Status: DC

## 2017-11-23 MED ORDER — METFORMIN HCL ER 500 MG PO TB24: 500.0000 mg | ORAL_TABLET | Freq: Every day | ORAL | 1 refills | Status: DC

## 2017-11-23 MED ORDER — ALBUTEROL SULFATE (2.5 MG/3ML) 0.083% IN NEBU
2.5000 mg | INHALATION_SOLUTION | Freq: Once | RESPIRATORY_TRACT | Status: AC
  Administered 2017-11-23: 2.5 mg via RESPIRATORY_TRACT

## 2017-11-23 MED ORDER — NITROGLYCERIN 0.4 MG SL SUBL: 0.4000 mg | SUBLINGUAL_TABLET | SUBLINGUAL | 12 refills | Status: DC | PRN

## 2017-11-23 MED ORDER — METOPROLOL TARTRATE 25 MG PO TABS: 25.0000 mg | ORAL_TABLET | Freq: Two times a day (BID) | ORAL | 1 refills | Status: DC

## 2017-11-23 MED ORDER — PREDNISONE 10 MG PO TABS: ORAL_TABLET | ORAL | 0 refills | Status: DC

## 2017-11-23 MED ORDER — ENALAPRIL MALEATE 20 MG PO TABS: 20.0000 mg | ORAL_TABLET | Freq: Two times a day (BID) | ORAL | 1 refills | Status: DC

## 2017-11-23 MED ORDER — DOXAZOSIN MESYLATE 2 MG PO TABS: 2.0000 mg | ORAL_TABLET | Freq: Every day | ORAL | 1 refills | Status: DC

## 2017-11-23 NOTE — Patient Instructions (Signed)

## 2017-11-23 NOTE — Assessment & Plan Note (Signed)
Doing better with A1c of 9.5- will add low dose metformin and recheck his kidney function in 2 weeks.

## 2017-11-23 NOTE — Assessment & Plan Note (Signed)
Call with any concerns. Will recheck next visit.

## 2017-11-23 NOTE — Assessment & Plan Note (Signed)
Not under good control. Will increase his amlodipine to 10mg  and recheck 2 weeks.

## 2017-11-23 NOTE — Progress Notes (Addendum)
BP (!) 186/78 (BP Location: Left Arm, Patient Position: Sitting, Cuff Size: Large)   Pulse (!) 58   Temp 98.7 F (37.1 C)   Wt 213 lb 2 oz (96.7 kg)   SpO2 96%   BMI 34.40 kg/m    Subjective:    Patient ID: Benjamin Chan, male    DOB: 1945-12-17, 72 y.o.   MRN: [id number]  HPI: Benjamin Chan is a 72 y.o. male  Chief Complaint  Patient presents with  . URI    x 2 WEEKS, SOB, chest and nasal congestion  . Medication Refill    Nitroglycerin  . Hypertension  . Diabetes   UPPER RESPIRATORY TRACT INFECTION Duration: 3 weeks Worst symptom: cough, sob Fever: no Cough: yes Shortness of breath: yes Wheezing: yes Chest pain: yes, with cough Chest tightness: yes Chest congestion: yes Nasal congestion: no Runny nose: no Post nasal drip: no Sneezing: no Sore throat: no Swollen glands: no Sinus pressure: no Headache: no Face pain: no Toothache: no Ear pain: no  Ear pressure: no  Eyes red/itching:no Eye drainage/crusting: no  Vomiting: no Rash: no Fatigue: no Sick contacts: no Strep contacts: no  Context: stable Recurrent sinusitis: no Relief with OTC cold/cough medications: no  Treatments attempted: tylenol   DIABETES Hypoglycemic episodes:no Polydipsia/polyuria: yes Visual disturbance: no Chest pain: no Paresthesias: no Glucose Monitoring: yes Taking Insulin?: yes Blood Pressure Monitoring: not checking Retinal Examination: Not up to Date Foot Exam: Up to Date Diabetic Education: Not Completed Pneumovax: Up to Date Influenza: Up to Date Aspirin: no  HYPERTENSION Hypertension status: uncontrolled  Satisfied with current treatment? no Duration of hypertension: chronic BP monitoring frequency:  not checking BP medication side effects:  no Medication compliance: excellent compliance Previous BP meds: amlodipine, cardura, enlalapril, metoprolol Aspirin: no Recurrent headaches: no Visual changes: no Palpitations: no Dyspnea: no Chest  pain: no Lower extremity edema: no Dizzy/lightheaded: no   Relevant past medical, surgical, family and social history reviewed and updated as indicated. Interim medical history since our last visit reviewed. Allergies and medications reviewed and updated.  Review of Systems  Constitutional: Negative.   Respiratory: Negative.   Cardiovascular: Negative.   Gastrointestinal: Negative.   Neurological: Negative.   Psychiatric/Behavioral: Negative.     Per HPI unless specifically indicated above     Objective:    BP (!) 186/78 (BP Location: Left Arm, Patient Position: Sitting, Cuff Size: Large)   Pulse (!) 58   Temp 98.7 F (37.1 C)   Wt 213 lb 2 oz (96.7 kg)   SpO2 96%   BMI 34.40 kg/m   Wt Readings from Last 3 Encounters:  11/23/17 213 lb 2 oz (96.7 kg)  08/24/17 215 lb 3.2 oz (97.6 kg)  07/10/17 215 lb 9.6 oz (97.8 kg)    Physical Exam  Constitutional: He is oriented to person, place, and time. He appears well-developed and well-nourished. No distress.  HENT:  Head: Normocephalic and atraumatic.  Right Ear: Hearing normal.  Left Ear: Hearing normal.  Nose: Nose normal.  Eyes: Conjunctivae and lids are normal. Right eye exhibits no discharge. Left eye exhibits no discharge. No scleral icterus.  Cardiovascular: Normal rate, regular rhythm, normal heart sounds and intact distal pulses. Exam reveals no gallop and no friction rub.  No murmur heard. Pulmonary/Chest: Effort normal. No stridor. No respiratory distress. He has wheezes. He has no rales. He exhibits no tenderness.  Decreased breath sounds throughout  Musculoskeletal: Normal range of motion.  Neurological: He is alert  and oriented to person, place, and time.  Skin: Skin is warm, dry and intact. Capillary refill takes less than 2 seconds. No rash noted. He is not diaphoretic. No erythema. No pallor.  Psychiatric: He has a normal mood and affect. His speech is normal and behavior is normal. Judgment and thought  content normal. Cognition and memory are normal.  Nursing note and vitals reviewed.   Results for orders placed or performed in visit on 07/10/17  Microscopic Examination  Result Value Ref Range   WBC, UA >30 (A) 0 - 5 /hpf   RBC, UA 0-2 0 - 2 /hpf   Epithelial Cells (non renal) 0-10 0 - 10 /hpf   Renal Epithel, UA 0-10 (A) None seen /hpf   Bacteria, UA Many (A) None seen/Few  Urine Culture, Reflex  Result Value Ref Range   Urine Culture, Routine Final report (A)    Organism ID, Bacteria Escherichia coli (A)    Antimicrobial Susceptibility Comment   CBC with Differential/Platelet  Result Value Ref Range   WBC 9.2 3.4 - 10.8 x10E3/uL   RBC 4.70 4.14 - 5.80 x10E6/uL   Hemoglobin 13.9 13.0 - 17.7 g/dL   Hematocrit 22.5 75.0 - 51.0 %   MCV 84 79 - 97 fL   MCH 29.6 26.6 - 33.0 pg   MCHC 35.2 31.5 - 35.7 g/dL   RDW 51.8 33.5 - 82.5 %   Platelets 330 150 - 379 x10E3/uL   Neutrophils 62 Not Estab. %   Lymphs 28 Not Estab. %   Monocytes 7 Not Estab. %   Eos 2 Not Estab. %   Basos 1 Not Estab. %   Neutrophils Absolute 5.7 1.4 - 7.0 x10E3/uL   Lymphocytes Absolute 2.6 0.7 - 3.1 x10E3/uL   Monocytes Absolute 0.6 0.1 - 0.9 x10E3/uL   EOS (ABSOLUTE) 0.2 0.0 - 0.4 x10E3/uL   Basophils Absolute 0.1 0.0 - 0.2 x10E3/uL   Immature Granulocytes 0 Not Estab. %   Immature Grans (Abs) 0.0 0.0 - 0.1 x10E3/uL  Comprehensive metabolic panel  Result Value Ref Range   Glucose 151 (H) 65 - 99 mg/dL   BUN 21 8 - 27 mg/dL   Creatinine, Ser 1.89 (H) 0.76 - 1.27 mg/dL   GFR calc non Af Amer 44 (L) >59 mL/min/1.73   GFR calc Af Amer 51 (L) >59 mL/min/1.73   BUN/Creatinine Ratio 14 10 - 24   Sodium 139 134 - 144 mmol/L   Potassium 4.0 3.5 - 5.2 mmol/L   Chloride 99 96 - 106 mmol/L   CO2 22 20 - 29 mmol/L   Calcium 8.9 8.6 - 10.2 mg/dL   Total Protein 6.7 6.0 - 8.5 g/dL   Albumin 3.7 3.5 - 4.8 g/dL   Globulin, Total 3.0 1.5 - 4.5 g/dL   Albumin/Globulin Ratio 1.2 1.2 - 2.2   Bilirubin Total 0.5  0.0 - 1.2 mg/dL   Alkaline Phosphatase 115 39 - 117 IU/L   AST 20 0 - 40 IU/L   ALT 16 0 - 44 IU/L  Lipid Panel w/o Chol/HDL Ratio  Result Value Ref Range   Cholesterol, Total 184 100 - 199 mg/dL   Triglycerides 842 0 - 149 mg/dL   HDL 36 (L) >10 mg/dL   VLDL Cholesterol Cal 20 5 - 40 mg/dL   LDL Calculated 312 (H) 0 - 99 mg/dL  HgB O1V  Result Value Ref Range   Hgb A1c MFr Bld 11.2 (H) 4.8 - 5.6 %   Est. average  glucose Bld gHb Est-mCnc 275 mg/dL  UA/M w/rflx Culture, Routine  Result Value Ref Range   Specific Gravity, UA 1.020 1.005 - 1.030   pH, UA 5.0 5.0 - 7.5   Color, UA Yellow Yellow   Appearance Ur Turbid (A) Clear   Leukocytes, UA 2+ (A) Negative   Protein, UA 1+ (A) Negative/Trace   Glucose, UA Trace (A) Negative   Ketones, UA Negative Negative   RBC, UA Trace (A) Negative   Bilirubin, UA Negative Negative   Urobilinogen, Ur 1.0 0.2 - 1.0 mg/dL   Nitrite, UA Positive (A) Negative   Microscopic Examination See below:    Urinalysis Reflex Comment       Assessment & Plan:   Problem List Items Addressed This Visit      Endocrine   DM (diabetes mellitus), type 2, uncontrolled, with renal complications (HCC)    Doing better with A1c of 9.5- will add low dose metformin and recheck his kidney function in 2 weeks.       Relevant Medications   enalapril (VASOTEC) 20 MG tablet   metFORMIN (GLUCOPHAGE-XR) 500 MG 24 hr tablet   CKD stage 3 due to type 2 diabetes mellitus (HCC)    Call with any concerns. Will recheck next visit.      Relevant Medications   enalapril (VASOTEC) 20 MG tablet   metFORMIN (GLUCOPHAGE-XR) 500 MG 24 hr tablet     Genitourinary   Benign hypertensive renal disease    Not under good control. Will increase his amlodipine to 10mg  and recheck 2 weeks.        Other Visit Diagnoses    COPD exacerbation (HCC)    -  Primary   Will treat with azithromycin and prednisone, recheck lungs 2 weeks.    Relevant Medications   albuterol (PROVENTIL)  (2.5 MG/3ML) 0.083% nebulizer solution 2.5 mg (Completed)   predniSONE (DELTASONE) 10 MG tablet   azithromycin (ZITHROMAX) 250 MG tablet   Immunization due       Flu shot given today.   Relevant Orders   Flu vaccine HIGH DOSE PF (Fluzone High dose) (Completed)       Follow up plan: Return in about 2 weeks (around 12/07/2017) for BP, lung and DM follow up.

## 2017-12-01 ENCOUNTER — Other Ambulatory Visit: Payer: Self-pay | Admitting: Family Medicine

## 2017-12-07 ENCOUNTER — Ambulatory Visit (INDEPENDENT_AMBULATORY_CARE_PROVIDER_SITE_OTHER): Payer: Medicare HMO | Admitting: Family Medicine

## 2017-12-07 ENCOUNTER — Encounter: Payer: Self-pay | Admitting: Family Medicine

## 2017-12-07 VITALS — BP 132/71 | HR 97 | Temp 98.5°F | Ht 65.0 in | Wt 213.6 lb

## 2017-12-07 DIAGNOSIS — J449 Chronic obstructive pulmonary disease, unspecified: Secondary | ICD-10-CM

## 2017-12-07 DIAGNOSIS — I129 Hypertensive chronic kidney disease with stage 1 through stage 4 chronic kidney disease, or unspecified chronic kidney disease: Secondary | ICD-10-CM | POA: Diagnosis not present

## 2017-12-07 DIAGNOSIS — E1129 Type 2 diabetes mellitus with other diabetic kidney complication: Secondary | ICD-10-CM | POA: Diagnosis not present

## 2017-12-07 DIAGNOSIS — IMO0002 Reserved for concepts with insufficient information to code with codable children: Secondary | ICD-10-CM

## 2017-12-07 DIAGNOSIS — E1165 Type 2 diabetes mellitus with hyperglycemia: Secondary | ICD-10-CM

## 2017-12-07 MED ORDER — GUAIFENESIN ER 600 MG PO TB12: 600.0000 mg | ORAL_TABLET | Freq: Two times a day (BID) | ORAL | 1 refills | Status: DC | PRN

## 2017-12-07 MED ORDER — ALBUTEROL SULFATE (2.5 MG/3ML) 0.083% IN NEBU: 2.5000 mg | INHALATION_SOLUTION | Freq: Four times a day (QID) | RESPIRATORY_TRACT | 1 refills | Status: DC | PRN

## 2017-12-07 NOTE — Progress Notes (Signed)
BP 132/71 (BP Location: Right Arm, Patient Position: Sitting, Cuff Size: Normal)   Pulse 97   Temp 98.5 F (36.9 C) (Tympanic)   Ht 5\' 5"  (1.651 m)   Wt 213 lb 9.6 oz (96.9 kg)   SpO2 97%   BMI 35.54 kg/m    Subjective:    Patient ID: Benjamin Chan, male    DOB: 1945/09/11, 72 y.o.   MRN: [id number]  HPI: Benjamin Chan is a 72 y.o. male  Chief Complaint  Patient presents with  . COPD    Follow-up, no complaints.  . Diabetes  . Hypertension   Here today for follow up several chronic conditions.   Amlodipine increased 2 weeks ago. BPs at home have been running under 140/90 consistently. No side effects with his medicine.   BSs running pretty well, 160 fasting in the mornings since addition of metformin 2 weeks ago. No reported side effects.   Completed azithromycin and prednisone, using spiriva and  faithfully for his COPD but still having significant issues with SOB. Wife on O2 at night and has been giving him some prn. Wanting to look into getting his own oxygen. Congestion has improved, no fevers, sore throat, CP.   Relevant past medical, surgical, family and social history reviewed and updated as indicated. Interim medical history since our last visit reviewed. Allergies and medications reviewed and updated.  Review of Systems  Per HPI unless specifically indicated above     Objective:    BP 132/71 (BP Location: Right Arm, Patient Position: Sitting, Cuff Size: Normal)   Pulse 97   Temp 98.5 F (36.9 C) (Tympanic)   Ht 5\' 5"  (1.651 m)   Wt 213 lb 9.6 oz (96.9 kg)   SpO2 97%   BMI 35.54 kg/m   Wt Readings from Last 3 Encounters:  12/07/17 213 lb 9.6 oz (96.9 kg)  11/23/17 213 lb 2 oz (96.7 kg)  08/24/17 215 lb 3.2 oz (97.6 kg)    Physical Exam  Constitutional: He appears well-developed and well-nourished. No distress.  HENT:  Head: Atraumatic.  Eyes: EOM are normal.  Neck: Normal range of motion. Neck supple.  Cardiovascular: Normal rate and  regular rhythm.  Pulmonary/Chest: Effort normal. No respiratory distress. He has no wheezes. He has no rales.  Breath sounds decreased b/l  Musculoskeletal: Normal range of motion.  Neurological: He is alert.  Skin: Skin is warm and dry. No rash noted.  Nursing note and vitals reviewed.   Results for orders placed or performed in visit on 11/23/17  Bayer DCA Hb A1c Waived  Result Value Ref Range   HB A1C (BAYER DCA - WAIVED) 9.5 (H) <7.0 %      Assessment & Plan:   Problem List Items Addressed This Visit      Respiratory   COPD (chronic obstructive pulmonary disease) (HCC)    Will refer to Pulmonology given continued poor control on inhaler regimen. No signs of bacterial infection, O2 saturation 97% on RA. Will also order nebulizer machine and solution for prn use in meantime. Continue current regimen      Relevant Medications   albuterol (PROVENTIL) (2.5 MG/3ML) 0.083% nebulizer solution   guaiFENesin (MUCINEX) 600 MG 12 hr tablet   Other Relevant Orders   Ambulatory referral to Pulmonology     Endocrine   DM (diabetes mellitus), type 2, uncontrolled, with renal complications (HCC) - Primary    Tolerating metformin well. Continue to monitor closely, work on improving dietary control. Will  recheck A1C at next visit        Genitourinary   Benign hypertensive renal disease    BPs improved with increased amlodipine. Continue current regimen and home monitoring          Follow up plan: Return in about 3 months (around 03/08/2018) for DM.

## 2017-12-10 NOTE — Assessment & Plan Note (Signed)
Tolerating metformin well. Continue to monitor closely, work on improving dietary control. Will recheck A1C at next visit

## 2017-12-10 NOTE — Assessment & Plan Note (Signed)
BPs improved with increased amlodipine. Continue current regimen and home monitoring

## 2017-12-10 NOTE — Patient Instructions (Signed)
Follow up as scheduled.  

## 2017-12-10 NOTE — Assessment & Plan Note (Addendum)
Will refer to Pulmonology given continued poor control on inhaler regimen. No signs of bacterial infection, O2 saturation 97% on RA. Will also order nebulizer machine and solution for prn use in meantime. Continue current regimen

## 2017-12-16 ENCOUNTER — Encounter: Payer: Self-pay | Admitting: Pulmonary Disease

## 2017-12-16 ENCOUNTER — Ambulatory Visit (INDEPENDENT_AMBULATORY_CARE_PROVIDER_SITE_OTHER): Payer: Medicare HMO | Admitting: Pulmonary Disease

## 2017-12-16 VITALS — BP 150/86 | HR 50 | Resp 95 | Ht 65.0 in | Wt 210.0 lb

## 2017-12-16 DIAGNOSIS — R0600 Dyspnea, unspecified: Secondary | ICD-10-CM

## 2017-12-16 DIAGNOSIS — J449 Chronic obstructive pulmonary disease, unspecified: Secondary | ICD-10-CM

## 2017-12-16 DIAGNOSIS — I2510 Atherosclerotic heart disease of native coronary artery without angina pectoris: Secondary | ICD-10-CM

## 2017-12-16 DIAGNOSIS — E1122 Type 2 diabetes mellitus with diabetic chronic kidney disease: Secondary | ICD-10-CM | POA: Diagnosis not present

## 2017-12-16 DIAGNOSIS — Z794 Long term (current) use of insulin: Secondary | ICD-10-CM | POA: Diagnosis not present

## 2017-12-16 MED ORDER — ALBUTEROL SULFATE HFA 108 (90 BASE) MCG/ACT IN AERS: 2.0000 | INHALATION_SPRAY | Freq: Four times a day (QID) | RESPIRATORY_TRACT | 3 refills | Status: DC | PRN

## 2017-12-16 NOTE — Patient Instructions (Addendum)
1) Symbicort (red and gray inhaler) should be used two puffs TWICE a day and not as needed.  2) Resume using your Spiriva daily  3) We will add a rescue inhaler that will be used to puffs every 4 to 6 hours as needed for shortness of breath. This inhaler is albuterol.  4) We will schedule you for breathing tests and an echocardiogram.  5) There is a possibility that Brillinta is making her shortness of breath worse since you have noticed that you shortness of breath has been worse after starting this medication. You will need to discuss with your heart doctor whether it is safe to switch you to a different type of medication (an example, Plavix).

## 2017-12-16 NOTE — Progress Notes (Deleted)
  Subjective:     Patient ID: Benjamin Chan, male   DOB: December 28, 1945, 72 y.o.   MRN: [id number]  HPI   Review of Systems     Objective:   Physical Exam     Assessment:     ***    Plan:     ***

## 2017-12-16 NOTE — Progress Notes (Signed)
Subjective:    Patient ID: Benjamin Chan, male    DOB: April 18, 1945, 72 y.o.   MRN: [id number]  HPI  The patient is a 72 year old former smoker, who presents for dyspnea with worsening for approximately six months to a year. He notes that it has been more pronounced over the last 6 to 7 months. The patient recently had albuterol and the realization treatments added to his regimen which he states that seem to help him. When I asked him about the current regimen that he is on he notes that he is using Symbicort 160/4.5, two inhalations only as needed. He also notes that he ran out of Spiriva over two weeks ago. He does not use a rescue inhaler. He seems to have confusion and lack of understanding of his medications. Review of his medications with the patient shows that in or around the time that his dyspnea got worse he was also started on Brillinta after he had stent placement for coronary artery disease. As noted he has had increase in his dyspnea particularly after this medication was started. He does describe some orthopnea and has to sit propped up. He also gets awakened during the night with dyspnea (PND). Patient also notices lower extremity edema particularly towards the end of the day. To his recollection he has not had any pulmonary function testing or recent echocardiogram. He has not had any fevers, chills or sweats. No cough or sputum production. No hemoptysis. He does not describe any chest pain per se.  Patient quit smoking approximately 5 to 6 years ago, he used to smoke one pack of cigarettes per day since age 23. The patient worked in a [employer]. 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 He does enjoy occasional jobs mowing lawns and seems to tolerate that well without getting too dyspneic.  Family history, past medical history and surgical history have been extensively reviewed.   Review of Systems  Constitutional: Positive for activity change and fatigue. Negative for  appetite change, chills, diaphoresis, fever and unexpected weight change.       He has curtailed some of his activities due to shortness of breath. Overall this is minimal however.  HENT: Negative.   Eyes: Negative.   Respiratory: Positive for chest tightness and shortness of breath. Negative for apnea, cough and choking.   Cardiovascular: Positive for leg swelling. Negative for chest pain and palpitations.       Patient does report orthopnea and paroxysmal nocturnal dyspnea  Gastrointestinal: Negative.   Endocrine: Negative.   Genitourinary: Negative.   Musculoskeletal: Negative.   Skin: Negative.   Allergic/Immunologic: Negative.   Neurological: Negative.   Hematological: Negative.   Psychiatric/Behavioral: Negative.    I have reviewed all the patient's available medical records.    Objective:   Physical Exam  Constitutional: He is oriented to person, place, and time. He appears well-developed and well-nourished. No distress.  HENT:  Head: Normocephalic and atraumatic.  Right Ear: External ear normal.  Left Ear: External ear normal.  Nose: Nose normal.  Mouth/Throat: Oropharynx is clear and moist. No oropharyngeal exudate.  Dentition  Eyes: Pupils are equal, round, and reactive to light. Conjunctivae and EOM are normal. No scleral icterus.  Neck: Normal range of motion. Neck supple. No JVD present. No tracheal deviation present. No thyromegaly present.  Cardiovascular: Normal rate, regular rhythm, normal heart sounds and intact distal pulses. Exam reveals no gallop and no friction rub.  No murmur heard. Pulmonary/Chest: Effort normal. No stridor. No  respiratory distress. He has no wheezes. He has no rales. He exhibits no tenderness.  Course breath sounds throughout  Abdominal: Soft. Bowel sounds are normal. He exhibits no distension.  Musculoskeletal: Normal range of motion. He exhibits no edema, tenderness or deformity.  Lymphadenopathy:    He has no cervical adenopathy.    Neurological: He is alert and oriented to person, place, and time. No cranial nerve deficit.  Skin: Skin is warm and dry. No rash noted. He is not diaphoretic. No erythema. No pallor.  Psychiatric: He has a normal mood and affect. His behavior is normal. Judgment and thought content normal.  Nursing note and vitals reviewed.         Assessment & Plan:    1) Dyspnea: the patient is dyspnea appears to be multifactorial by the history provided. It appears that he has poorly compensated chronic obstructive pulmonary disease on clinical grounds. He will need baseline pulmonary function testing to determine the severity of his disease. His symptoms may be aggravated by a cardiac component. In addition he is on Brillinta which is known to have dyspnea as a side effect. Though in most instances, this symptom is self-limiting and some individuals particularly those with COPD the symptoms lingers. The patient may require switched to a different antiplatelet medication such as Plavix. Will obtain a 2D Echo to further evaluate potential cardiac contribution of his dyspnea.  2) Chronic obstructive pulmonary disease: patient has chronic obstructive pulmonary disease by clinical impression. It appears that he is poorly compensated in part due to poor comprehension of his medication regimen and not using his maintenance medications as prescribed. Pulmonary function testing will be ordered to further characterize this issue. He has been instructed that he has to use his Symbicort two puffs twice a day and not just as needed. We reviewed the proper use of the inhaler with the patient. His technique was acceptable. In addition, he was reminded to restart Spiriva not to incur in interruptions of therapy. The patient also will be placed on a rescue inhaler. He was instructed on the proper use of the inhaler devices.  3) Coronary artery disease: corporate vessel unknown. Patient has had prior coronary artery bypass  grafting and had stent placement approximately 6 to 7 months ago. He has been on antiplatelet medications namely Brillinta. He has noted increase in dyspnea since this medication was introduced. It may be prudent to switch this medication to a different antiplatelet agent if the data does not show significant issues with his COPD and the patient continues to have symptoms of dyspnea despite adequate chronic obstructive pulmonary disease treatment.  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 This issue adds complexity to his management. This is being followed by his primary care provider.   The patient will be seen in follow-up in 4 to 6 weeks time, he is to contact us prior to that time should any new difficulties arise with regards to his pulmonary symptoms.  I am grateful for the opportunity to participate in this gentleman's care.

## 2017-12-29 ENCOUNTER — Ambulatory Visit (INDEPENDENT_AMBULATORY_CARE_PROVIDER_SITE_OTHER): Payer: Medicare HMO

## 2017-12-29 ENCOUNTER — Other Ambulatory Visit: Payer: Self-pay

## 2017-12-29 DIAGNOSIS — R0600 Dyspnea, unspecified: Secondary | ICD-10-CM | POA: Diagnosis not present

## 2018-01-04 ENCOUNTER — Telehealth: Payer: Self-pay

## 2018-01-04 NOTE — Telephone Encounter (Signed)
Patient would like to have his medication sent to [facility]

## 2018-01-04 NOTE — Telephone Encounter (Signed)
Call pt We have off the books samples that we can give him Come by and pick them up

## 2018-01-04 NOTE — Telephone Encounter (Signed)
Medication refill for Abuterol Sulfate 2.5 mg/9mL

## 2018-01-05 ENCOUNTER — Ambulatory Visit: Payer: Medicare HMO | Attending: Pulmonary Disease

## 2018-01-05 DIAGNOSIS — R0600 Dyspnea, unspecified: Secondary | ICD-10-CM | POA: Diagnosis not present

## 2018-01-05 DIAGNOSIS — R918 Other nonspecific abnormal finding of lung field: Secondary | ICD-10-CM | POA: Diagnosis not present

## 2018-01-05 DIAGNOSIS — J449 Chronic obstructive pulmonary disease, unspecified: Secondary | ICD-10-CM | POA: Insufficient documentation

## 2018-01-05 MED ORDER — ALBUTEROL SULFATE (2.5 MG/3ML) 0.083% IN NEBU
2.5000 mg | INHALATION_SOLUTION | Freq: Once | RESPIRATORY_TRACT | Status: AC
  Administered 2018-01-05: 2.5 mg via RESPIRATORY_TRACT
  Filled 2018-01-05: qty 3

## 2018-01-05 NOTE — Telephone Encounter (Signed)
Patient notified

## 2018-01-07 ENCOUNTER — Other Ambulatory Visit: Payer: Self-pay

## 2018-01-07 MED ORDER — TICAGRELOR 90 MG PO TABS: 90.0000 mg | ORAL_TABLET | Freq: Two times a day (BID) | ORAL | 2 refills | Status: DC

## 2018-01-07 NOTE — Telephone Encounter (Signed)
Patient requesting change to mail order pharmacy.

## 2018-01-11 ENCOUNTER — Ambulatory Visit (INDEPENDENT_AMBULATORY_CARE_PROVIDER_SITE_OTHER): Payer: Medicare HMO | Admitting: Family Medicine

## 2018-01-11 ENCOUNTER — Other Ambulatory Visit: Payer: Self-pay | Admitting: Family Medicine

## 2018-01-11 DIAGNOSIS — Z69020 Encounter for mental health services for victim of non-parental child abuse: Secondary | ICD-10-CM

## 2018-01-11 NOTE — Telephone Encounter (Signed)
Copied from CRM [id number]. Topic: Quick Communication - See Telephone Encounter >> Jan 11, 2018  2:45 PM Terisa Starr wrote: CRM for notification. See Telephone encounter for: 01/11/18.  albuterol (PROVENTIL) (2.5 MG/3ML) 0.083% nebulizer solution  [facility]

## 2018-01-11 NOTE — Progress Notes (Signed)
   There were no vitals taken for this visit.   Subjective:    Patient ID: Benjamin Chan, male    DOB: 10-09-1945, 72 y.o.   MRN: [id number]  HPI: Benjamin Chan is a 72 y.o. male  No chief complaint on file.  There was no office visit on this patient

## 2018-01-12 ENCOUNTER — Other Ambulatory Visit: Payer: Self-pay

## 2018-01-12 MED ORDER — ALBUTEROL SULFATE (2.5 MG/3ML) 0.083% IN NEBU: 2.5000 mg | INHALATION_SOLUTION | Freq: Four times a day (QID) | RESPIRATORY_TRACT | 1 refills | Status: DC | PRN

## 2018-01-25 ENCOUNTER — Ambulatory Visit (INDEPENDENT_AMBULATORY_CARE_PROVIDER_SITE_OTHER): Payer: Medicare HMO | Admitting: Family Medicine

## 2018-01-25 ENCOUNTER — Encounter: Payer: Self-pay | Admitting: Family Medicine

## 2018-01-25 VITALS — BP 172/81 | HR 56 | Temp 98.4°F | Ht 65.0 in | Wt 217.3 lb

## 2018-01-25 DIAGNOSIS — R0609 Other forms of dyspnea: Secondary | ICD-10-CM

## 2018-01-25 DIAGNOSIS — R0600 Dyspnea, unspecified: Secondary | ICD-10-CM

## 2018-01-25 NOTE — Progress Notes (Signed)
   BP (!) 172/81 (BP Location: Right Arm, Patient Position: Sitting, Cuff Size: Large)   Pulse (!) 56   Temp 98.4 F (36.9 C)   Ht 5\' 5"  (1.651 m)   Wt 217 lb 5 oz (98.6 kg)   SpO2 97%   BMI 36.16 kg/m    Subjective:    Patient ID: Benjamin Chan, male    DOB: Apr 28, 1945, 72 y.o.   MRN: [id number]  HPI: Benjamin Chan is a 72 y.o. male  Chief Complaint  Patient presents with  . difficulity breathing    Patient states that the Brilinta is causing breathing issues   Seen by Pumonology, has f/u in 2 days with them for his worsening DOE. Was told that his brilinta could be contributing. Wanting to be switched off of it. Followed by Cardiology for CAD s/p bypass graft and PVD. Overdue for appt since July 2019. Denies bleeding or bruising issues.   Relevant past medical, surgical, family and social history reviewed and updated as indicated. Interim medical history since our last visit reviewed. Allergies and medications reviewed and updated.  Review of Systems  Per HPI unless specifically indicated above     Objective:    BP (!) 172/81 (BP Location: Right Arm, Patient Position: Sitting, Cuff Size: Large)   Pulse (!) 56   Temp 98.4 F (36.9 C)   Ht 5\' 5"  (1.651 m)   Wt 217 lb 5 oz (98.6 kg)   SpO2 97%   BMI 36.16 kg/m   Wt Readings from Last 3 Encounters:  01/25/18 217 lb 5 oz (98.6 kg)  12/16/17 210 lb (95.3 kg)  12/07/17 213 lb 9.6 oz (96.9 kg)    Physical Exam  Results for orders placed or performed in visit on 11/23/17  Bayer DCA Hb A1c Waived  Result Value Ref Range   HB A1C (BAYER DCA - WAIVED) 9.5 (H) <7.0 %      Assessment & Plan:   Problem List Items Addressed This Visit    None    Visit Diagnoses    DOE (dyspnea on exertion)    -  Primary   Discussed with patient that this would need to be discussed with Cardiology. Appt scheduled in clinic for him. Will no charge visit today. No other concerns       Follow up plan: Return for as  scheduled.

## 2018-01-27 ENCOUNTER — Encounter: Payer: Self-pay | Admitting: Pulmonary Disease

## 2018-01-27 ENCOUNTER — Ambulatory Visit (INDEPENDENT_AMBULATORY_CARE_PROVIDER_SITE_OTHER): Payer: Medicare HMO | Admitting: Pulmonary Disease

## 2018-01-27 VITALS — BP 140/62 | HR 57 | Ht 65.0 in | Wt 217.4 lb

## 2018-01-27 DIAGNOSIS — R0602 Shortness of breath: Secondary | ICD-10-CM | POA: Diagnosis not present

## 2018-01-27 DIAGNOSIS — I2781 Cor pulmonale (chronic): Secondary | ICD-10-CM

## 2018-01-27 DIAGNOSIS — J449 Chronic obstructive pulmonary disease, unspecified: Secondary | ICD-10-CM

## 2018-01-27 MED ORDER — FLUTICASONE-UMECLIDIN-VILANT 100-62.5-25 MCG/INH IN AEPB: 1.0000 | INHALATION_SPRAY | Freq: Every day | RESPIRATORY_TRACT | 0 refills | Status: DC

## 2018-01-27 MED ORDER — FLUTICASONE-UMECLIDIN-VILANT 100-62.5-25 MCG/INH IN AEPB: 1.0000 | INHALATION_SPRAY | Freq: Every day | RESPIRATORY_TRACT | 11 refills | Status: DC

## 2018-01-27 NOTE — Progress Notes (Signed)
Subjective:    Patient ID: Benjamin Chan, male    DOB: September 07, 1945, 72 y.o.   MRN: [id number]  HPI  The patient is a 72 year old former smoker, who presents for follow-up of dyspnea. He notes that it has been more pronounced over the last 6 to 7 months.  We saw him initially on 16 December 2017.  During that visit he was instructed to use Symbicort 160/4.5, 2 inhalations twice a day as scheduled dose and not as needed.   He was instructed to resume Spiriva however, he has not been able to do this due to the cost of the medication.  He is using albuterol as rescue.  He does note some minimal improvement in his symptoms but continues to have significant dyspnea.  He continues to be on Brilinta which can of course, worsen the symptom.He does describe some orthopnea and has to sit propped up. He also gets awakened during the night with dyspnea (PND). Patient also notices lower extremity edema particularly towards the end of the day. He has not had any fevers, chills or sweats. No cough or sputum production. No hemoptysis. He does not describe any chest pain per se.  The patient apparently held 2 doses of Brilinta and noted improvement on his dyspnea.  I have recommended he do not skip this medication unless discussed with his cardiologist.  He had an echocardiogram on 15 October which showed some mild right ventricular dysfunction.  Left ventricular ejection fraction was normal.  This study overall was poor quality due to poor visualization.  There were elevated central venous pressure noted.  He had pulmonary function testing performed on 22 October which showed severe obstructive lung disease FEV1 was 1.50 L or 46% predicted FEV1/FVC was 46%.  Patient had significant air trapping and decreased diffusion capacity  Review of Systems  Constitutional: Positive for fatigue.  HENT: Negative.   Eyes: Negative.   Respiratory: Positive for chest tightness and shortness of breath.   Cardiovascular: Positive  for leg swelling.  Gastrointestinal: Negative.   Endocrine: Negative.   Genitourinary: Negative.   Neurological: Negative.   Hematological: Negative.   All other systems reviewed and are negative.      Objective:   Physical Exam  Constitutional: He is oriented to person, place, and time. He appears well-developed.  Non-toxic appearance. He does not appear ill. No distress.  Obese gentleman, no acute distress.  HENT:  Head: Normocephalic and atraumatic.  Mouth/Throat: Oropharynx is clear and moist.  Eyes: Pupils are equal, round, and reactive to light. EOM are normal.  Neck: Normal range of motion. Neck supple. No JVD present. No tracheal deviation present.  Cardiovascular: Normal rate, regular rhythm and normal heart sounds.  No murmur heard. Pulmonary/Chest: Effort normal. No accessory muscle usage. No respiratory distress. He has decreased breath sounds (Distant breath sounds throughout). He has no wheezes. Rhonchi: No rhonchi, coarse breath sounds. He has no rales.  Abdominal: Soft. He exhibits no distension.  Musculoskeletal:       Right lower leg: Normal. He exhibits no edema.       Left lower leg: Normal. He exhibits no edema.  Lymphadenopathy:    He has no cervical adenopathy.  Neurological: He is alert and oriented to person, place, and time.  No focal deficits.  Skin: Skin is warm and dry. No rash noted. No cyanosis. Nails show no clubbing.  Psychiatric: He has a normal mood and affect. His behavior is normal.  Nursing note and vitals reviewed.  We performed ambulatory oximetry today and the patient did not desaturate past 90% he was able to ambulate for 6 minutes to the level of fatigue.       Assessment & Plan:   1) Dyspnea: the patient is dyspnea appears to be multifactorial.  A main component of his dyspnea is poorly compensated chronic obstructive pulmonary disease.  We will manage this as below.  He also has cardiac issues.  He has been on Brilinta which he has  noted has worsened his dyspnea.  He may benefit from having this switched to a different antiplatelet agent.  He is also having difficulties affording the medication.  In addition it also appears that the patient is developing cor pulmonale.  We will check overnight oximetry to exclude nocturnal hypoxemia as trigger for this issue.  We will see the patient in 2 months time he is to contact us prior to that time should any new difficulties arise.  2) Chronic obstructive pulmonary disease: patient has chronic obstructive pulmonary disease which is severe and by GOLD criteria class III.  He has difficulties affording to inhalers for maintenance.  We will give her a trial of Trelegy Ellipta 1 inhalation daily.  It appears that this medication is covered by his plan.  He also has difficulties adhering to a medication regimen and this may simplify compliance issues for him.  He may continue to use rescue inhaler.  3) Coronary artery disease:   Patient has had prior coronary artery bypass grafting and had stent placement approximately 6 to 7 months ago. He has been on antiplatelet medications namely Brillinta. He has noted increase in dyspnea since this medication was introduced.  The patient held 2 doses of this medication and actually felt his dyspnea was better.  I have recommended that he not hold the medication but rather discuss with his cardiologist switching him to a different agent.  It may be prudent to switch this medication to a different antiplatelet agent.  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  Overnight oximetry will be ordered.  He did not show significant hypoxic response on ambulatory oximetry.  4) Diabetes Mellitus Type II with long-term current use of insulin and renal involvement. This issue adds complexity to his management. This is being followed by his primary care provider.

## 2018-01-27 NOTE — Patient Instructions (Addendum)
1) STOP Symbicort and Spiriva  2) START Trelegy Ellipta one puff daily in the morning.  3) Your oxygen level was good while you were walking today.  4) We will check on overnight study you oxygen test.  5) Ask your cardiologist about switching Brillinta to Plavix as you have noted that the Brillinta causes you shortness of breath.

## 2018-01-27 NOTE — Progress Notes (Signed)
Patient seen in the office today and instructed on use of Trelegy.  Patient expressed understanding and demonstrated technique.  

## 2018-02-01 ENCOUNTER — Encounter: Payer: Self-pay | Admitting: Pulmonary Disease

## 2018-02-08 ENCOUNTER — Ambulatory Visit: Payer: Medicare HMO | Admitting: Family Medicine

## 2018-02-10 DIAGNOSIS — I25810 Atherosclerosis of coronary artery bypass graft(s) without angina pectoris: Secondary | ICD-10-CM | POA: Diagnosis not present

## 2018-02-10 DIAGNOSIS — E782 Mixed hyperlipidemia: Secondary | ICD-10-CM | POA: Diagnosis not present

## 2018-02-10 DIAGNOSIS — R0602 Shortness of breath: Secondary | ICD-10-CM | POA: Diagnosis not present

## 2018-02-10 DIAGNOSIS — I10 Essential (primary) hypertension: Secondary | ICD-10-CM | POA: Diagnosis not present

## 2018-03-09 ENCOUNTER — Other Ambulatory Visit: Payer: Self-pay | Admitting: Family Medicine
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
## 2018-03-22 ENCOUNTER — Other Ambulatory Visit: Payer: Self-pay | Admitting: Family Medicine

## 2018-03-22 NOTE — Telephone Encounter (Signed)
Copied from CRM [id number]. Topic: Quick Communication - Rx Refill/Question >> Mar 22, 2018 11:13 AM Arlyss Gandy, NT wrote: Medication: finasteride (PROSCAR) 5 MG tablet   Has the patient contacted their pharmacy? Yes.   (Agent: If no, request that the patient contact the pharmacy for the refill.) (Agent: If yes, when and what did the pharmacy advise?)  Preferred Pharmacy (with phone number or street name): [facility] [address] ([address]), [place] - [address] [phone] (Phone) [phone] (Fax)    Agent: Please be advised that RX refills may take up to 3 business days. We ask that you follow-up with your pharmacy.

## 2018-03-23 MED ORDER — FINASTERIDE 5 MG PO TABS: 5.0000 mg | ORAL_TABLET | Freq: Every day | ORAL | 1 refills | Status: DC

## 2018-04-07 ENCOUNTER — Encounter: Payer: Self-pay | Admitting: Family Medicine

## 2018-04-07 ENCOUNTER — Ambulatory Visit (INDEPENDENT_AMBULATORY_CARE_PROVIDER_SITE_OTHER): Payer: Medicare HMO | Admitting: Family Medicine

## 2018-04-07 VITALS — BP 137/71 | HR 61 | Temp 98.2°F | Ht 66.0 in | Wt 224.6 lb

## 2018-04-07 DIAGNOSIS — E785 Hyperlipidemia, unspecified: Secondary | ICD-10-CM

## 2018-04-07 DIAGNOSIS — E1165 Type 2 diabetes mellitus with hyperglycemia: Secondary | ICD-10-CM | POA: Diagnosis not present

## 2018-04-07 DIAGNOSIS — I10 Essential (primary) hypertension: Secondary | ICD-10-CM

## 2018-04-07 DIAGNOSIS — E1122 Type 2 diabetes mellitus with diabetic chronic kidney disease: Secondary | ICD-10-CM | POA: Diagnosis not present

## 2018-04-07 DIAGNOSIS — N183 Chronic kidney disease, stage 3 (moderate): Secondary | ICD-10-CM

## 2018-04-07 LAB — BAYER DCA HB A1C WAIVED: HB A1C (BAYER DCA - WAIVED): 11.4 % — ABNORMAL HIGH (ref <7.0)

## 2018-04-07 MED ORDER — CANAGLIFLOZIN 300 MG PO TABS: 300.0000 mg | ORAL_TABLET | Freq: Every day | ORAL | 4 refills | Status: DC

## 2018-04-07 MED ORDER — METFORMIN HCL ER 500 MG PO TB24: 1000.0000 mg | ORAL_TABLET | Freq: Two times a day (BID) | ORAL | 1 refills | Status: DC

## 2018-04-07 MED ORDER — ATORVASTATIN CALCIUM 40 MG PO TABS: 40.0000 mg | ORAL_TABLET | Freq: Every day | ORAL | 3 refills | Status: DC

## 2018-04-07 NOTE — Assessment & Plan Note (Signed)
The current medical regimen is effective;  continue present plan and medications.  

## 2018-04-07 NOTE — Assessment & Plan Note (Signed)
Labwork pending  

## 2018-04-07 NOTE — Progress Notes (Signed)
BP 137/71 (BP Location: Left Arm, Patient Position: Sitting, Cuff Size: Normal)   Pulse 61   Temp 98.2 F (36.8 C) (Oral)   Ht 5\' 6"  (1.676 m)   Wt 224 lb 9.6 oz (101.9 kg)   SpO2 95%   BMI 36.25 kg/m    Subjective:    Patient ID: Benjamin Chan, male    DOB: August 20, 1945, 73 y.o.   MRN: [id number]  HPI: Benjamin Chan is a 73 y.o. male  Chief Complaint  Patient presents with  . Sore    Right ankle. Ongoing 2-3 weeks.   History assisted by his wife. Patient with sore right medial ankle ongoing 2 to 3 weeks. Concerned because of history of poor control of diabetes. Chart reviewed patient has a long history of poor control of diabetes poor use of medicines primarily due to cost of medicines and compliance issues. Reviewed kidney disease which is especially sensitive with patient because of his wife is on dialysis. Reviewed his coronary artery disease patient with no chest pain chest tightness Patient limited by COPD for which he is quit smoking. May be taking cholesterol medications Relevant past medical, surgical, family and social history reviewed and updated as indicated. Interim medical history since our last visit reviewed. Allergies and medications reviewed and updated.  Review of Systems  Constitutional: Negative.   Respiratory: Negative.   Cardiovascular: Negative.     Per HPI unless specifically indicated above     Objective:    BP 137/71 (BP Location: Left Arm, Patient Position: Sitting, Cuff Size: Normal)   Pulse 61   Temp 98.2 F (36.8 C) (Oral)   Ht 5\' 6"  (1.676 m)   Wt 224 lb 9.6 oz (101.9 kg)   SpO2 95%   BMI 36.25 kg/m   Wt Readings from Last 3 Encounters:  04/07/18 224 lb 9.6 oz (101.9 kg)  01/27/18 217 lb 6.4 oz (98.6 kg)  01/25/18 217 lb 5 oz (98.6 kg)    Physical Exam Constitutional:      Appearance: He is well-developed.  HENT:     Head: Normocephalic and atraumatic.  Eyes:     Conjunctiva/sclera: Conjunctivae normal.  Neck:       Musculoskeletal: Normal range of motion.  Cardiovascular:     Rate and Rhythm: Normal rate and regular rhythm.     Heart sounds: Normal heart sounds.  Pulmonary:     Effort: Pulmonary effort is normal.     Breath sounds: Normal breath sounds.  Musculoskeletal: Normal range of motion.  Skin:    Findings: No erythema.     Comments: Superficial scratch on medial ankle  Neurological:     Mental Status: He is alert and oriented to person, place, and time.  Psychiatric:        Behavior: Behavior normal.        Thought Content: Thought content normal.        Judgment: Judgment normal.     Results for orders placed or performed in visit on 11/23/17  Bayer DCA Hb A1c Waived  Result Value Ref Range   HB A1C (BAYER DCA - WAIVED) 9.5 (H) <7.0 %      Assessment & Plan:   Problem List Items Addressed This Visit      Cardiovascular and Mediastinum   Benign essential HTN    The current medical regimen is effective;  continue present plan and medications.       Relevant Medications   atorvastatin (LIPITOR) 40 MG tablet  Endocrine   CKD stage 3 due to type 2 diabetes mellitus (HCC) - Primary    Discuss poor control of diabetes dosing of medications.  Patient had not increase metformin to 1000 twice daily discussed increasing by 500 mg a week. Discussed caution with kidney disease and will check BMP next office visit. Discussed increasing insulin and dosing schedule. Start Invokana as that seems to be covered by his insurance discussed yeast infections and prevention.      Relevant Medications   canagliflozin (INVOKANA) 300 MG TABS tablet   metFORMIN (GLUCOPHAGE-XR) 500 MG 24 hr tablet   atorvastatin (LIPITOR) 40 MG tablet   Other Relevant Orders   Bayer DCA Hb A1c Waived     Other   Hyperlipidemia    Lab work pending      Relevant Medications   atorvastatin (LIPITOR) 40 MG tablet       Follow up plan: Return in about 3 months (around 07/07/2018) for Hemoglobin  A1c, BMP,  Lipids, ALT, AST.

## 2018-04-07 NOTE — Assessment & Plan Note (Signed)
Discuss poor control of diabetes dosing of medications.  Patient had not increase metformin to 1000 twice daily discussed increasing by 500 mg a week. Discussed caution with kidney disease and will check BMP next office visit. Discussed increasing insulin and dosing schedule. Start Invokana as that seems to be covered by his insurance discussed yeast infections and prevention.

## 2018-04-28 ENCOUNTER — Other Ambulatory Visit: Payer: Self-pay | Admitting: Family Medicine

## 2018-04-29 ENCOUNTER — Telehealth: Payer: Self-pay | Admitting: Family Medicine

## 2018-04-29 MED ORDER — ALBUTEROL SULFATE (2.5 MG/3ML) 0.083% IN NEBU: 2.5000 mg | INHALATION_SOLUTION | Freq: Four times a day (QID) | RESPIRATORY_TRACT | 1 refills | Status: DC | PRN

## 2018-04-29 NOTE — Telephone Encounter (Signed)
Pt presented in office stating that he is almost out of his albuterol sulfate soln nebu states that he had changed to the [facility] mail order and doesn't know when they will mail it wanting to know if you can send to [facility] on [address]. Please advise.

## 2018-05-07 ENCOUNTER — Other Ambulatory Visit: Payer: Self-pay | Admitting: Family Medicine

## 2018-05-31 ENCOUNTER — Other Ambulatory Visit: Payer: Self-pay | Admitting: Family Medicine

## 2018-06-09 ENCOUNTER — Other Ambulatory Visit: Payer: Self-pay | Admitting: Family Medicine

## 2018-06-11 ENCOUNTER — Telehealth: Payer: Self-pay | Admitting: Family Medicine

## 2018-06-11 NOTE — Telephone Encounter (Signed)
Called pt to let know of telephone visit no answer.LMOM

## 2018-06-11 NOTE — Telephone Encounter (Signed)
Called pt to inform of telephone visit Monday no answer. LMOM

## 2018-06-14 ENCOUNTER — Telehealth: Payer: Self-pay | Admitting: Family Medicine

## 2018-06-14 ENCOUNTER — Encounter: Payer: Self-pay | Admitting: Family Medicine

## 2018-06-14 ENCOUNTER — Ambulatory Visit (INDEPENDENT_AMBULATORY_CARE_PROVIDER_SITE_OTHER): Payer: Medicare HMO | Admitting: Family Medicine

## 2018-06-14 DIAGNOSIS — E1129 Type 2 diabetes mellitus with other diabetic kidney complication: Secondary | ICD-10-CM | POA: Diagnosis not present

## 2018-06-14 DIAGNOSIS — E1165 Type 2 diabetes mellitus with hyperglycemia: Secondary | ICD-10-CM | POA: Diagnosis not present

## 2018-06-14 DIAGNOSIS — E785 Hyperlipidemia, unspecified: Secondary | ICD-10-CM

## 2018-06-14 DIAGNOSIS — I10 Essential (primary) hypertension: Secondary | ICD-10-CM

## 2018-06-14 DIAGNOSIS — J449 Chronic obstructive pulmonary disease, unspecified: Secondary | ICD-10-CM

## 2018-06-14 DIAGNOSIS — IMO0002 Reserved for concepts with insufficient information to code with codable children: Secondary | ICD-10-CM

## 2018-06-14 MED ORDER — INSULIN NPH (HUMAN) (ISOPHANE) 100 UNIT/ML ~~LOC~~ SUSP: 50.0000 [IU] | Freq: Two times a day (BID) | SUBCUTANEOUS | 10 refills | Status: DC

## 2018-06-14 MED ORDER — ENALAPRIL MALEATE 20 MG PO TABS: 20.0000 mg | ORAL_TABLET | Freq: Two times a day (BID) | ORAL | 1 refills | Status: DC

## 2018-06-14 MED ORDER — DOXAZOSIN MESYLATE 2 MG PO TABS: 2.0000 mg | ORAL_TABLET | Freq: Every day | ORAL | 1 refills | Status: DC

## 2018-06-14 MED ORDER — CANAGLIFLOZIN 300 MG PO TABS: 300.0000 mg | ORAL_TABLET | Freq: Every day | ORAL | 1 refills | Status: DC

## 2018-06-14 MED ORDER — GLIPIZIDE 5 MG PO TABS: 5.0000 mg | ORAL_TABLET | Freq: Every day | ORAL | 2 refills | Status: DC

## 2018-06-14 MED ORDER — ATORVASTATIN CALCIUM 80 MG PO TABS: 80.0000 mg | ORAL_TABLET | Freq: Every day | ORAL | 3 refills | Status: DC

## 2018-06-14 MED ORDER — METOPROLOL TARTRATE 25 MG PO TABS: 25.0000 mg | ORAL_TABLET | Freq: Two times a day (BID) | ORAL | 1 refills | Status: DC

## 2018-06-14 NOTE — Assessment & Plan Note (Signed)
The current medical regimen is effective;  continue present plan and medications.  

## 2018-06-14 NOTE — Telephone Encounter (Signed)
Call pt He is to continue his current dosing as that is what is working.

## 2018-06-14 NOTE — Telephone Encounter (Signed)
Copied from CRM [id number]. Topic: Quick Communication - See Telephone Encounter >> Jun 14, 2018 10:24 AM Jens Som A wrote: CRM for notification. See Telephone encounter for: 06/14/18.  [facility] is calling regarding instructions for insulin NPH Human (HUMULIN N,NOVOLIN N) 100 UNIT/ML injection [[id number]] received 2 sets of instructions. Calling to clarify. Please advise CB- [phone]

## 2018-06-14 NOTE — Telephone Encounter (Signed)
Per todays's d/c'd insulin's Sig "Inject 35 Units into the skin 2 (two) times daily before a meal. 35 units twice a day."  Called and gave [facility] correct sig

## 2018-06-14 NOTE — Progress Notes (Signed)
There were no vitals taken for this visit.   Subjective:    Patient ID: Benjamin Chan, male    DOB: 11-Oct-1945, 73 y.o.   MRN: [id number]  HPI: Benjamin Chan is a 73 y.o. male  Chief Complaint  Patient presents with  . Follow-up  . Hypertension  . Hyperlipidemia  . Diabetes  Telemedicine using audio and telecommunications for a synchronous communication visit. Today's visit due to COVID-19 isolation precautions I connected with Benjamin Chan and verified that I am speaking with the correct person using two identifiers.   I discussed the limitations, risks, security and privacy concerns of performing an evaluation and management service by telephone and the availability of in person appointments. I also discussed with the patient that there may be a patient responsible charge related to this service. The patient expressed understanding and agreed to proceed. The patient's location is [name] in [place] take out also pts wife  I am at home. Discussed with patient and wife who assists with history diabetes doing well patient is doing so much better with paying attention to his diet is cut out soft drinks and is feeling better also.  Has been able to lose a little weight and blood pressure remains good control. Diabetes no low blood sugar spells. Cholesterol doing well also. Breathing stable. Patient and wife are paying attention to the coronavirus social distancing.  Relevant past medical, surgical, family and social history reviewed and updated as indicated. Interim medical history since our last visit reviewed. Allergies and medications reviewed and updated.  Review of Systems  Constitutional: Negative.   Respiratory: Negative.   Cardiovascular: Negative.     Per HPI unless specifically indicated above     Objective:    There were no vitals taken for this visit.  Wt Readings from Last 3 Encounters:  04/07/18 224 lb 9.6 oz (101.9 kg)  01/27/18 217 lb 6.4 oz  (98.6 kg)  01/25/18 217 lb 5 oz (98.6 kg)    Physical Exam none Results for orders placed or performed in visit on 04/07/18  Bayer DCA Hb A1c Waived  Result Value Ref Range   HB A1C (BAYER DCA - WAIVED) 11.4 (H) <7.0 %      Assessment & Plan:   Problem List Items Addressed This Visit      Cardiovascular and Mediastinum   Benign essential HTN    The current medical regimen is effective;  continue present plan and medications.       Relevant Medications   enalapril (VASOTEC) 20 MG tablet   doxazosin (CARDURA) 2 MG tablet   atorvastatin (LIPITOR) 80 MG tablet   metoprolol tartrate (LOPRESSOR) 25 MG tablet     Respiratory   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 classification (HCC)    The current medical regimen is effective;  continue present plan and medications.         Endocrine   DM (diabetes mellitus), type 2, uncontrolled, with renal complications (HCC)    The current medical regimen is effective;  continue present plan and medications.       Relevant Medications   insulin NPH Human (HUMULIN N,NOVOLIN N) 100 UNIT/ML injection   enalapril (VASOTEC) 20 MG tablet   atorvastatin (LIPITOR) 80 MG tablet   canagliflozin (INVOKANA) 300 MG TABS tablet   glipiZIDE (GLUCOTROL) 5 MG tablet     Other   Hyperlipidemia    The current medical regimen is effective;  continue present plan and medications.  Relevant Medications   enalapril (VASOTEC) 20 MG tablet   doxazosin (CARDURA) 2 MG tablet   atorvastatin (LIPITOR) 80 MG tablet   metoprolol tartrate (LOPRESSOR) 25 MG tablet       I discussed the assessment and treatment plan with the patient. The patient was provided an opportunity to ask questions and all were answered. The patient agreed with the plan and demonstrated an understanding of the instructions.   The patient was advised to call back or seek an in-person evaluation if the symptoms worsen or if the condition fails to improve as anticipated.   I provided 21+  minutes of time during this encounter. Follow up plan: Return in about 3 months (around 09/14/2018) for Hemoglobin A1c, BMP,  Lipids, ALT, AST.

## 2018-07-12 ENCOUNTER — Other Ambulatory Visit: Payer: Self-pay | Admitting: Family Medicine

## 2018-07-12 ENCOUNTER — Ambulatory Visit: Payer: Medicare HMO

## 2018-07-19 ENCOUNTER — Ambulatory Visit (INDEPENDENT_AMBULATORY_CARE_PROVIDER_SITE_OTHER): Payer: Medicare HMO

## 2018-07-19 ENCOUNTER — Other Ambulatory Visit: Payer: Self-pay

## 2018-07-19 ENCOUNTER — Encounter (INDEPENDENT_AMBULATORY_CARE_PROVIDER_SITE_OTHER): Payer: Self-pay

## 2018-07-19 VITALS — BP 124/62 | HR 53 | Temp 98.1°F | Resp 16 | Ht 66.0 in | Wt 219.9 lb

## 2018-07-19 DIAGNOSIS — Z0000 Encounter for general adult medical examination without abnormal findings: Secondary | ICD-10-CM | POA: Diagnosis not present

## 2018-07-19 DIAGNOSIS — E1129 Type 2 diabetes mellitus with other diabetic kidney complication: Secondary | ICD-10-CM

## 2018-07-19 DIAGNOSIS — E1165 Type 2 diabetes mellitus with hyperglycemia: Secondary | ICD-10-CM | POA: Diagnosis not present

## 2018-07-19 DIAGNOSIS — IMO0002 Reserved for concepts with insufficient information to code with codable children: Secondary | ICD-10-CM

## 2018-07-19 NOTE — Progress Notes (Signed)
Subjective:   Benjamin Chan is a 73 y.o. male who presents for Medicare Annual/Subsequent preventive examination.  Review of Systems:   Cardiac Risk Factors include: advanced age (>51men, >63 women);diabetes mellitus;hypertension;dyslipidemia;male gender;obesity (BMI >30kg/m2);smoking/ tobacco exposure     Objective:    Vitals: BP 124/62 (BP Location: Left Arm, Patient Position: Sitting)   Pulse (!) 53   Temp 98.1 F (36.7 C) (Temporal)   Resp 16   Ht  (1.676 m)   Wt 219 lb 14.4 oz (99.7 kg)   SpO2 97%   BMI 35.49 kg/m   Body mass index is 35.49 kg/m.  Advanced Directives 07/19/2018 07/10/2017 04/24/2017 04/01/2017 03/30/2017 03/27/2017 03/27/2017  Does Patient Have a Medical Advance Directive? No No No No Yes No No  Type of Advance Directive - - - - Midwife - -  Does patient want to make changes to medical advance directive? - - - No - Patient declined No - Patient declined - -  Copy of Healthcare Power of Attorney in Chart? - - - - No - copy requested - -  Would patient like information on creating a medical advance directive? Yes (MAU/Ambulatory/Procedural Areas - Information given) No - Patient declined No - Patient declined - No - Patient declined No - Patient declined No - Patient declined    Tobacco Social History   Tobacco Use  Smoking Status Former Smoker  . Years: 40.00  . Types: Cigarettes  . Last attempt to quit: 2010  . Years since quitting: 10.3  Smokeless Tobacco Never Used     Counseling given: Not Answered   Clinical Intake:  Pre-visit preparation completed: Yes  Pain : No/denies pain Pain Score: 0-No pain     Nutritional Status: BMI > 30  Obese Nutritional Risks: None Diabetes: Yes CBG done?: No Did pt. bring in CBG monitor from home?: No  How often do you need to have someone help you when you read instructions, pamphlets, or other written materials from your doctor or pharmacy?: 5 - Always What is the last grade  level you completed in school?: 9th grade  Nutrition Risk Assessment:  Has the patient had any N/V/D within the last 2 months?  No  Does the patient have any non-healing wounds?  No  Has the patient had any unintentional weight loss or weight gain?  No   Diabetes:  Is the patient diabetic?  Yes  If diabetic, was a CBG obtained today?  No  Did the patient bring in their glucometer from home?  No  How often do you monitor your CBG's? As needed.   Financial Strains and Diabetes Management:  Are you having any financial strains with the device, your supplies or your medication? Yes . Insulin - ccm sent  Does the patient want to be seen by Chronic Care Management for management of their diabetes?  No  Would the patient like to be referred to a Nutritionist or for Diabetic Management?  Yes  for insulin assistance  Diabetic Exams:  Diabetic Eye Exam:  Overdue for diabetic eye exam. Pt has been advised about the importance in completing this exam.   Diabetic Foot Exam:. Pt has been advised about the importance in completing this exam.   Interpreter Needed?: No  Information entered by :: Alohilani Levenhagen,LPN  Past Medical History:  Diagnosis Date  . BPH (benign prostatic hyperplasia)   . CAD (coronary artery disease)   . Chronic kidney disease   . COPD (chronic  obstructive pulmonary disease) (HCC)   . Diabetes mellitus without complication (HCC)   . Hypertension   . MI (myocardial infarction) (HCC) 03/2017   Past Surgical History:  Procedure Laterality Date  . CORONARY ARTERY BYPASS GRAFT    . CORONARY STENT INTERVENTION N/A 03/30/2017   Procedure: CORONARY STENT INTERVENTION;  Surgeon: Alwyn Pea, MD;  Location: [facility] INVASIVE CV LAB;  Service: Cardiovascular;  Laterality: N/A;  . INGUINAL HERNIA REPAIR     right  . LEFT HEART CATH AND CORS/GRAFTS ANGIOGRAPHY N/A 03/30/2017   Procedure: LEFT HEART CATH AND CORS/GRAFTS ANGIOGRAPHY;  Surgeon: Lamar Blinks, MD;  Location:  [facility] INVASIVE CV LAB;  Service: Cardiovascular;  Laterality: N/A;   Family History  Problem Relation Age of Onset  . Hypertension Father   . Hypertension Mother   . Hypertension Sister   . Hypertension Brother   . Hypertension Brother   . Hypertension Brother   . Hypertension Brother   . Hypertension Brother   . Hypertension Sister   . Hypertension Sister   . Hypertension Sister   . Hypertension Sister   . Hypertension Sister    Social History   Socioeconomic History  . Marital status: Married    Spouse name: Not on file  . Number of children: Not on file  . Years of education: Not on file  . Highest education level: 9th grade  Occupational History  . Occupation: retired  Engineer, production  . Financial resource strain: Not hard at all  . Food insecurity:    Worry: Never true    Inability: Never true  . Transportation needs:    Medical: No    Non-medical: No  Tobacco Use  . Smoking status: Former Smoker    Years: 40.00    Types: Cigarettes    Last attempt to quit: 2010    Years since quitting: 10.3  . Smokeless tobacco: Never Used  Substance and Sexual Activity  . Alcohol use: No  . Drug use: No  . Sexual activity: Not on file  Lifestyle  . Physical activity:    Days per week: 0 days    Minutes per session: 0 min  . Stress: Not at all  Relationships  . Social connections:    Talks on phone: More than three times a week    Gets together: Twice a week    Attends religious service: More than 4 times per year    Active member of club or organization: No    Attends meetings of clubs or organizations: Never    Relationship status: Married  Other Topics Concern  . 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 ACCU-CHEK FASTCLIX LANCETS MISC Use one lancet to check blood sugars between 1-4 times daily  . acetaminophen (TYLENOL) 325 MG tablet  Take 650 mg by mouth every 6 (six) hours as needed.  [name] albuterol (PROVENTIL HFA;VENTOLIN HFA) 108 (90 Base) MCG/ACT inhaler Inhale 2 puffs into the lungs every 6 (six) hours as needed for wheezing or shortness of breath.  [name] albuterol (PROVENTIL) (2.5 MG/3ML) 0.083% nebulizer solution Take 3 mLs (2.5 mg total) by nebulization every 6 (six) hours as needed for wheezing or shortness of breath.  . Alcohol Swabs (B-D SINGLE USE SWABS REGULAR) PADS Use one swab each time blood sugar is checked to clean site prior to lancet use  .  amLODipine (NORVASC) 10 MG tablet TAKE 1 TABLET BY MOUTH ONCE DAILY  . aspirin 81 MG tablet Take 81 mg by mouth daily.  [name] atorvastatin (LIPITOR) 80 MG tablet Take 1 tablet (80 mg total) by mouth daily.  . budesonide-formoterol (SYMBICORT) 160-4.5 MCG/ACT inhaler Inhale 2 puffs into the lungs 2 (two) times daily.  . canagliflozin (INVOKANA) 300 MG TABS tablet Take 1 tablet (300 mg total) by mouth daily before breakfast.  . doxazosin (CARDURA) 2 MG tablet Take 1 tablet (2 mg total) by mouth daily.  . enalapril (VASOTEC) 20 MG tablet Take 1 tablet (20 mg total) by mouth 2 (two) times daily.  . finasteride (PROSCAR) 5 MG tablet Take 1 tablet (5 mg total) by mouth daily.  . fluticasone (FLONASE) 50 MCG/ACT nasal spray Place into both nostrils daily. Pt takes twice a day  . Fluticasone-Umeclidin-Vilant (TRELEGY ELLIPTA) 100-62.5-25 MCG/INH AEPB Inhale 1 puff into the lungs daily.  [name] glipiZIDE (GLUCOTROL) 5 MG tablet Take 1 tablet (5 mg total) by mouth daily before breakfast.  . glucose blood (ACCU-CHEK AVIVA) test strip Use as instructed  . guaiFENesin (MUCINEX) 600 MG 12 hr tablet Take 1 tablet (600 mg total) by mouth 2 (two) times daily as needed.  . insulin NPH Human (HUMULIN N,NOVOLIN N) 100 UNIT/ML injection Inject 0.5 mLs (50 Units total) into the skin 2 (two) times daily before a meal. 35 units twice a day.  . metFORMIN (GLUCOPHAGE-XR) 500 MG 24 hr tablet Take 2 tablets (1,000  mg total) by mouth 2 (two) times daily.  . metoprolol tartrate (LOPRESSOR) 25 MG tablet Take 1 tablet (25 mg total) by mouth 2 (two) times daily.  . Multiple Vitamin (MULTIVITAMIN) capsule Take 1 capsule by mouth daily.  . nitroGLYCERIN (NITROSTAT) 0.4 MG SL tablet Place 1 tablet (0.4 mg total) under the tongue every 5 (five) minutes as needed for chest pain.  . traMADol (ULTRAM) 50 MG tablet Take 1 tablet (50 mg total) by mouth every 8 (eight) hours as needed.  . ticagrelor (BRILINTA) 90 MG TABS tablet Take 1 tablet (90 mg total) by mouth 2 (two) times daily.  . [DISCONTINUED] atorvastatin (LIPITOR) 40 MG tablet Take 1 tablet (40 mg total) by mouth daily. (Patient not taking: Reported on 07/19/2018)   No facility-administered encounter medications on file as of 07/19/2018.     Activities of Daily Living In your present state of health, do you have any difficulty performing the following activities: 07/19/2018  Hearing? N  Vision? N  Difficulty concentrating or making decisions? N  Walking or climbing stairs? N  Dressing or bathing? N  Doing errands, shopping? N  Preparing Food and eating ? N  Using the Toilet? N  In the past six months, have you accidently leaked urine? N  Do you have problems with loss of bowel control? N  Managing your Medications? N  Managing your Finances? N  Housekeeping or managing your Housekeeping? N  Some recent data might be hidden    Patient Care Team: Steele Sizer, MD as PCP - General (Family Medicine)   Assessment:   This is a routine wellness examination for Sidarth.  Exercise Activities and Dietary recommendations Current Exercise Habits: The patient does not participate in regular exercise at present, Exercise limited by: None identified  Goals    . DIET - INCREASE WATER INTAKE     Recommend drinking at least 6-8 glasses of water a day       Fall Risk Fall Risk  07/19/2018 07/10/2017 05/18/2017 04/27/2017 04/06/2017  Falls in the past year? 0 No No  No No   FALL RISK PREVENTION PERTAINING TO THE HOME:  Any stairs in or around the home? No  If so, are there any without handrails? n/a  Home free of loose throw rugs in walkways, pet beds, electrical cords, etc? Yes  Adequate lighting in your home to reduce risk of falls? Yes   ASSISTIVE DEVICES UTILIZED TO PREVENT FALLS:  Life alert? Yes  Use of a cane, walker or w/c? No  Grab bars in the bathroom? Yes  Shower chair or bench in shower? Yes  Elevated toilet seat or a handicapped toilet? Yes    TIMED UP AND GO:  Was the test performed? Yes .  Length of time to ambulate 10 feet: 11 sec.   GAIT:  Appearance of gait: Gait steady and fast without the use of an assistive device.  Education: Fall risk prevention has been discussed.  Intervention(s) required? No   DME/home health order needed?  No    Depression Screen PHQ 2/9 Scores 07/19/2018 07/10/2017 05/18/2017 04/27/2017  PHQ - 2 Score 0 0 0 0    Cognitive Function     6CIT Screen 07/19/2018 07/10/2017  What Year? 0 points 4 points  What month? 0 points 3 points  What time? 0 points 0 points  Count back from 20 0 points 0 points  Months in reverse 0 points 0 points  Repeat phrase 0 points 2 points  Total Score 0 9    Immunization History  Administered Date(s) Administered  . Influenza, High Dose Seasonal PF 12/31/2016, 11/23/2017  . Influenza, Seasonal, Injecte, Preservative Fre 01/22/2006, 01/10/2008, 02/05/2009, 02/19/2010, 03/28/2011, 12/25/2011  . Influenza,inj,Quad PF,6+ Mos 12/13/2013, 11/21/2014, 01/25/2016  . Influenza-Unspecified 02/15/2013, 12/31/2016  . Pneumococcal Conjugate-13 08/15/2014  . Pneumococcal Polysaccharide-23 05/02/2003, 02/19/2010    Qualifies for Shingles Vaccine? Yes  Zostavax completed n/a. Due for Shingrix. Education has been provided regarding the importance of this vaccine. Pt has been advised to call insurance company to determine out of pocket expense. Advised may also receive vaccine  at local pharmacy or Health Dept. Verbalized acceptance and understanding.  Tdap: Although this vaccine is not a covered service during a Wellness Exam, does the patient still wish to receive this vaccine today?  No .  Education has been provided regarding the importance of this vaccine. Advised may receive this vaccine at local pharmacy or Health Dept. Aware to provide a copy of the vaccination record if obtained from local pharmacy or Health Dept. Verbalized acceptance and understanding.  Flu Vaccine: up to date   Pneumococcal Vaccine:up to date   Screening Tests Health Maintenance  Topic Date Due  . FOOT EXAM  11/29/1955  . OPHTHALMOLOGY EXAM  11/29/1955  . TETANUS/TDAP  11/30/1962  . COLONOSCOPY  04/10/2018  . HEMOGLOBIN A1C  10/06/2018  . INFLUENZA VACCINE  10/16/2018  . Hepatitis C Screening  Completed  . PNA vac Low Risk Adult  Completed   Cancer Screenings:  Colorectal Screening: Completed 04/10/2008. Repeat every 10 years; due now. Patient will call and schedule  Lung Cancer Screening: (Low Dose CT Chest recommended if Age 57-80 years, 30 pack-year currently smoking OR have quit w/in 15years.) does not qualify.    Additional Screening:  Hepatitis C Screening:completed 07/10/2017  Dental Screening: Recommended annual dental exams for proper oral hygiene  Community Resource Referral:  CRR required this visit?  No        Plan:  I have personally reviewed and addressed the Medicare Annual Wellness questionnaire and have noted the following in the patient's chart:  A. Medical and social history B. Use of alcohol, tobacco or illicit drugs  C. Current medications and supplements D. Functional ability and status E.  Nutritional status F.  Physical activity G. Advance directives H. List of other physicians I.  Hospitalizations, surgeries, and ER visits in previous 12 months J.  Vitals K. Screenings such as hearing and vision if needed, cognitive and depression L.  Referrals and appointments   In addition, I have reviewed and discussed with patient certain preventive protocols, quality metrics, and best practice recommendations. A written personalized care plan for preventive services as well as general preventive health recommendations were provided to patient.   Signed,   Collene Schlichter, LPN  03/22/1094 Nurse Health Advisor  Nurse Notes: [facility]

## 2018-07-19 NOTE — Patient Instructions (Signed)
Benjamin Chan , Thank you for taking time to come for your Medicare Wellness Visit. I appreciate your ongoing commitment to your health goals. Please review the following plan we discussed and let me know if I can assist you in the future.   Screening recommendations/referrals: Colonoscopy: due now, call and schedule  Recommended yearly ophthalmology/optometry visit for glaucoma screening and checkup Recommended yearly dental visit for hygiene and checkup  Vaccinations: Influenza vaccine: up to date Pneumococcal vaccine: up to date Tdap vaccine: due, check with your insurance company for coverage  Shingles vaccine: shingrix eligible, check with your insurance company for coverage   Advanced directives: Advance directive discussed with you today. I have provided a copy for you to complete at home and have notarized. Once this is complete please bring a copy in to our office so we can scan it into your chart.  Conditions/risks identified: diabetic, please schedule your eye exam.  Next appointment: Follow up in one year for your annual wellness exam.   Preventive Care 65 Years and Older, Male Preventive care refers to lifestyle choices and visits with your health care provider that can promote health and wellness. What does preventive care include?  A yearly physical exam. This is also called an annual well check.  Dental exams once or twice a year.  Routine eye exams. Ask your health care provider how often you should have your eyes checked.  Personal lifestyle choices, including:  Daily care of your teeth and gums.  Regular physical activity.  Eating a healthy diet.  Avoiding tobacco and drug use.  Limiting alcohol use.  Practicing safe sex.  Taking low doses of aspirin every day.  Taking vitamin and mineral supplements as recommended by your health care provider. What happens during an annual well check? The services and screenings done by your health care provider  during your annual well check will depend on your age, overall health, lifestyle risk factors, and family history of disease. Counseling  Your health care provider may ask you questions about your:  Alcohol use.  Tobacco use.  Drug use.  Emotional well-being.  Home and relationship well-being.  Sexual activity.  Eating habits.  History of falls.  Memory and ability to understand (cognition).  Work and work Astronomer. Screening  You may have the following tests or measurements:  Height, weight, and BMI.  Blood pressure.  Lipid and cholesterol levels. These may be checked every 5 years, or more frequently if you are over 11 years old.  Skin check.  Lung cancer screening. You may have this screening every year starting at age 51 if you have a 30-pack-year history of smoking and currently smoke or have quit within the past 15 years.  Fecal occult blood test (FOBT) of the stool. You may have this test every year starting at age 28.  Flexible sigmoidoscopy or colonoscopy. You may have a sigmoidoscopy every 5 years or a colonoscopy every 10 years starting at age 11.  Prostate cancer screening. Recommendations will vary depending on your family history and other risks.  Hepatitis C blood test.  Hepatitis B blood test.  Sexually transmitted disease (STD) testing.  Diabetes screening. This is done by checking your blood sugar (glucose) after you have not eaten for a while (fasting). You may have this done every 1-3 years.  Abdominal aortic aneurysm (AAA) screening. You may need this if you are a current or former smoker.  Osteoporosis. You may be screened starting at age 22 if you are at  high risk. Talk with your health care provider about your test results, treatment options, and if necessary, the need for more tests. Vaccines  Your health care provider may recommend certain vaccines, such as:  Influenza vaccine. This is recommended every year.  Tetanus,  diphtheria, and acellular pertussis (Tdap, Td) vaccine. You may need a Td booster every 10 years.  Zoster vaccine. You may need this after age 64.  Pneumococcal 13-valent conjugate (PCV13) vaccine. One dose is recommended after age 67.  Pneumococcal polysaccharide (PPSV23) vaccine. One dose is recommended after age 15. Talk to your health care provider about which screenings and vaccines you need and how often you need them. This information is not intended to replace advice given to you by your health care provider. Make sure you discuss any questions you have with your health care provider. Document Released: 03/30/2015 Document Revised: 11/21/2015 Document Reviewed: 01/02/2015 Elsevier Interactive Patient Education  2017 [place] Prevention in the Home Falls can cause injuries. They can happen to people of all ages. There are many things you can do to make your home safe and to help prevent falls. What can I do on the outside of my home?  Regularly fix the edges of walkways and driveways and fix any cracks.  Remove anything that might make you trip as you walk through a door, such as a raised step or threshold.  Trim any bushes or trees on the path to your home.  Use bright outdoor lighting.  Clear any walking paths of anything that might make someone trip, such as rocks or tools.  Regularly check to see if handrails are loose or broken. Make sure that both sides of any steps have handrails.  Any raised decks and porches should have guardrails on the edges.  Have any leaves, snow, or ice cleared regularly.  Use sand or salt on walking paths during winter.  Clean up any spills in your garage right away. This includes oil or grease spills. What can I do in the bathroom?  Use night lights.  Install grab bars by the toilet and in the tub and shower. Do not use towel bars as grab bars.  Use non-skid mats or decals in the tub or shower.  If you need to sit down in  the shower, use a plastic, non-slip stool.  Keep the floor dry. Clean up any water that spills on the floor as soon as it happens.  Remove soap buildup in the tub or shower regularly.  Attach bath mats securely with double-sided non-slip rug tape.  Do not have throw rugs and other things on the floor that can make you trip. What can I do in the bedroom?  Use night lights.  Make sure that you have a light by your bed that is easy to reach.  Do not use any sheets or blankets that are too big for your bed. They should not hang down onto the floor.  Have a firm chair that has side arms. You can use this for support while you get dressed.  Do not have throw rugs and other things on the floor that can make you trip. What can I do in the kitchen?  Clean up any spills right away.  Avoid walking on wet floors.  Keep items that you use a lot in easy-to-reach places.  If you need to reach something above you, use a strong step stool that has a grab bar.  Keep electrical cords out of the  way.  Do not use floor polish or wax that makes floors slippery. If you must use wax, use non-skid floor wax.  Do not have throw rugs and other things on the floor that can make you trip. What can I do with my stairs?  Do not leave any items on the stairs.  Make sure that there are handrails on both sides of the stairs and use them. Fix handrails that are broken or loose. Make sure that handrails are as long as the stairways.  Check any carpeting to make sure that it is firmly attached to the stairs. Fix any carpet that is loose or worn.  Avoid having throw rugs at the top or bottom of the stairs. If you do have throw rugs, attach them to the floor with carpet tape.  Make sure that you have a light switch at the top of the stairs and the bottom of the stairs. If you do not have them, ask someone to add them for you. What else can I do to help prevent falls?  Wear shoes that:  Do not have high  heels.  Have rubber bottoms.  Are comfortable and fit you well.  Are closed at the toe. Do not wear sandals.  If you use a stepladder:  Make sure that it is fully opened. Do not climb a closed stepladder.  Make sure that both sides of the stepladder are locked into place.  Ask someone to hold it for you, if possible.  Clearly mark and make sure that you can see:  Any grab bars or handrails.  First and last steps.  Where the edge of each step is.  Use tools that help you move around (mobility aids) if they are needed. These include:  Canes.  Walkers.  Scooters.  Crutches.  Turn on the lights when you go into a dark area. Replace any light bulbs as soon as they burn out.  Set up your furniture so you have a clear path. Avoid moving your furniture around.  If any of your floors are uneven, fix them.  If there are any pets around you, be aware of where they are.  Review your medicines with your doctor. Some medicines can make you feel dizzy. This can increase your chance of falling. Ask your doctor what other things that you can do to help prevent falls. This information is not intended to replace advice given to you by your health care provider. Make sure you discuss any questions you have with your health care provider. Document Released: 12/28/2008 Document Revised: 08/09/2015 Document Reviewed: 04/07/2014 Elsevier Interactive Patient Education  2017 [employer].

## 2018-07-20 ENCOUNTER — Telehealth: Payer: Self-pay

## 2018-07-23 ENCOUNTER — Telehealth: Payer: Self-pay

## 2018-07-27 ENCOUNTER — Ambulatory Visit (INDEPENDENT_AMBULATORY_CARE_PROVIDER_SITE_OTHER): Payer: Medicare HMO | Admitting: Pharmacist

## 2018-07-27 DIAGNOSIS — E1129 Type 2 diabetes mellitus with other diabetic kidney complication: Secondary | ICD-10-CM | POA: Diagnosis not present

## 2018-07-27 DIAGNOSIS — IMO0002 Reserved for concepts with insufficient information to code with codable children: Secondary | ICD-10-CM

## 2018-07-27 DIAGNOSIS — E1165 Type 2 diabetes mellitus with hyperglycemia: Secondary | ICD-10-CM

## 2018-07-27 MED ORDER — BASAGLAR KWIKPEN 100 UNIT/ML ~~LOC~~ SOPN: 56.0000 [IU] | PEN_INJECTOR | Freq: Every day | SUBCUTANEOUS | 1 refills | Status: DC

## 2018-07-27 NOTE — Patient Instructions (Signed)
Visit Information  Goals Addressed            This Visit's Progress     Patient Stated   . "We can't afford his insulin" (pt-stated)       Current Barriers:  . Financial Barriers  o Reports household income 1,065/mo + 850/mo  = 22,980/year . Patient reports blood glucose improved with Invokana  o Blood glucose running 140-190 2 hours after a meal, previously was consistently in the 200s   . Hypoglycemia events reporting blood glucose as low as 40-50 about 2 nights per week  o Patient experiences dizzy, sweating, weakness, this wakes him up  o He treats these events by eating peanut butter crackers, sandwiches, or drinks a sweat drink    Pharmacist Clinical Goal(s):  [name] Over the next 14 days, patient will work with PharmD to address needs related to patient assistance   Interventions: . Comprehensive medication review performed. . Discussed plans with patient for ongoing care management follow up and provided patient with direct contact information for care management team  . Patient previously approved for partial Medicare Extra Help in January 2019  . Collaborated with provider in clinic today, Dr. Wynetta Emery, to have a prescription for [place] sent in to the pharmacy for this patient. Recommend significant dose reduction, 60 units once daily, due to AM hypoglycemia on current NPH regimen . Will collaborate with PCP Dr. Jeananne Rama for patient assistance for expensive medications o Patient qualifies for [place] assistance. Will collaborate with PCP to complete patient assistance applications for [place] through [employer] o Patient does NOT qualify for Invokana patient assistance, but does qualify for [employer] assistance through [employer]. He received BI assistance in 2019. Will collaborate with   Patient Self Care Activities:  . Self administers medications as prescribed . Calls provider office for new concerns or questions  . Complete the patient portion of patient  assistance and provide proof of income for the application  . Review pharmacy expenditures and if out of pocket expense is greater than $600 can more forward to seek assistance with Trelegy inhaler   Initial goal documentation        Benjamin Chan was given information about Chronic Care Management services today including:  1. CCM service includes personalized support from designated clinical staff supervised by his physician, including individualized plan of care and coordination with other care providers 2. 24/7 contact phone numbers for assistance for urgent and routine care needs. 3. Service will only be billed when office clinical staff spend 20 minutes or more in a month to coordinate care. 4. Only one practitioner may furnish and bill the service in a calendar month. 5. The patient may stop CCM services at any time (effective at the end of the month) by phone call to the office staff. 6. The patient will be responsible for cost sharing (co-pay) of up to 20% of the service fee (after annual deductible is met).  Patient agreed to services and verbal consent obtained.   The patient verbalized understanding of instructions provided today and declined a print copy of patient instruction materials.   Plan: - Will collaborate with PCP Dr. Jeananne Rama on the patient assistance plans noted above - Will contact patient tomorrow to follow up on immediate supply of [place], PharmD Clinical Pharmacist [place] [phone]

## 2018-07-27 NOTE — Addendum Note (Signed)
Addended by: Dorcas Carrow on: 07/27/2018 04:36 PM   Modules accepted: Orders

## 2018-07-27 NOTE — Progress Notes (Signed)
Slightly unclear on dose Rx has both 35 units BID and 50 unitls BID. Unclear if taking 70 or 100 units of NPH daily- will change to basaglar. Will be cautious and start at 80% of 70 units. Start 56 units daily and follow up by phone in 3-7 days.

## 2018-07-27 NOTE — Chronic Care Management (AMB) (Signed)
Chronic Care Management   Note  07/27/2018 Name: Benjamin Chan MRN: [id number] DOB: 08/28/1945   Subjective:  Patient is a 73 year old male followed by Golden Pop, MD for primary care services, referred to chronic care management team for support in managing diabetes, CAD s/p NSTEMI, CABG, COPD.    [name] was given information about Chronic Care Management services today including:  1. CCM service includes personalized support from designated clinical staff supervised by his physician, including individualized plan of care and coordination with other care providers 2. 24/7 contact phone numbers for assistance for urgent and routine care needs. 3. Service will only be billed when office clinical staff spend 20 minutes or more in a month to coordinate care. 4. Only one practitioner may furnish and bill the service in a calendar month. 5. The patient may stop CCM services at any time (effective at the end of the month) by phone call to the office staff. 6. The patient will be responsible for cost sharing (co-pay) of up to 20% of the service fee (after annual deductible is met).  Patient agreed to services and verbal consent obtained.   His wife, Benjamin Chan, helps manage medications and health conditions. Performed medication review with her, and worked with her to procure insulin immediate supply coupon.  Review of patient status, including review of consultants reports, laboratory and other test data, was performed as part of comprehensive evaluation and provision of chronic care management services.   Objective: Lab Results  Component Value Date   CREATININE 1.54 (H) 07/10/2017   CREATININE 1.67 (H) 04/24/2017   CREATININE 1.45 (H) 03/31/2017  eGFR 51 - though last labwork done 07/10/2017  Lab Results  Component Value Date   HGBA1C 11.4 (H) 04/07/2018    Lipid Panel     Component Value Date/Time   CHOL 184 07/10/2017 0932   TRIG 100 07/10/2017 0932   HDL 36  (L) 07/10/2017 0932   CHOLHDL 4.6 03/29/2017 0615   VLDL 22 03/29/2017 0615   LDLCALC 128 (H) 07/10/2017 0932    BP Readings from Last 3 Encounters:  07/19/18 124/62  04/07/18 137/71  01/27/18 140/62    Allergies  Allergen Reactions  . Brilinta [Ticagrelor] Shortness Of Breath    D/c by cardiology    Medications Reviewed Today    Reviewed by De Hollingshead, [facility] (Pharmacist) on 07/27/18 at 1340  Med List Status: <None>  Medication Order Taking? Sig Documenting Provider Last Dose Status Informant  ACCU-CHEK FASTCLIX LANCETS [place] [id number] Yes Use one lancet to check blood sugars between 1-4 times daily Volney American, PA-C Taking Active   acetaminophen (TYLENOL) 325 MG tablet [id number] Yes Take 650 mg by mouth every 6 (six) hours as needed. [provider] Taking Active Self  albuterol (PROVENTIL HFA;VENTOLIN HFA) 108 (90 Base) MCG/ACT inhaler [id number] Yes Inhale 2 puffs into the lungs every 6 (six) hours as needed for wheezing or shortness of breath. Tyler Pita, MD Taking Active            Med Note ([place] Jul 27, 2018  1:31 PM) Currently out   albuterol (PROVENTIL) (2.5 MG/3ML) 0.083% nebulizer solution [id number] Yes Take 3 mLs (2.5 mg total) by nebulization every 6 (six) hours as needed for wheezing or shortness of breath. Guadalupe Maple, MD Taking Active            Med Note Lula Olszewski Jul 27, 2018  1:32 PM) Every morning and night   Alcohol Swabs (B-D SINGLE USE SWABS REGULAR) PADS [id number] Yes Use one swab each time blood sugar is checked to clean site prior to lancet use Volney American, PA-C Taking Active   amLODipine (NORVASC) 10 MG tablet [id number] Yes TAKE 1 TABLET BY MOUTH ONCE DAILY [name], [name], MD Taking Active            Med Note Darnelle Maffucci, Arville Lime   Tue Jul 27, 2018  1:33 PM) Takes AM   aspirin 81 MG tablet [id number] Yes Take 81 mg by mouth daily. [provider] Taking Active  Self           Med Note Arvella Nigh, MORGAN A   Fri Jun 29, 2015 11:13 PM)    atorvastatin (LIPITOR) 80 MG tablet [id number] Yes Take 1 tablet (80 mg total) by mouth daily. Guadalupe Maple, MD Taking Active   canagliflozin [facility]) 300 MG TABS tablet [id number] Yes Take 1 tablet (300 mg total) by mouth daily before breakfast. Guadalupe Maple, MD Taking Active   clopidogrel (PLAVIX) 75 MG tablet [id number] Yes Take 75 mg by mouth daily. [provider] Taking Active   doxazosin (CARDURA) 2 MG tablet [id number] Yes Take 1 tablet (2 mg total) by mouth daily. Guadalupe Maple, MD Taking Active            Med Note De Hollingshead   Tue Jul 27, 2018  1:34 PM) HS  enalapril (VASOTEC) 20 MG tablet [id number] Yes Take 1 tablet (20 mg total) by mouth 2 (two) times daily. Guadalupe Maple, MD Taking Active   finasteride (PROSCAR) 5 MG tablet [id number] Yes Take 1 tablet (5 mg total) by mouth daily. Kathrine Haddock, NP Taking Active   fluticasone [facility]) 50 MCG/ACT nasal spray [id number] Yes Place into both nostrils daily. Pt takes twice a day [provider] Taking Active   Fluticasone-Umeclidin-Vilant (TRELEGY ELLIPTA) 100-62.5-25 MCG/INH AEPB [id number] Yes Inhale 1 puff into the lungs daily. Tyler Pita, MD Taking Active   glipiZIDE (GLUCOTROL) 5 MG tablet [id number] Yes Take 1 tablet (5 mg total) by mouth daily before breakfast. Guadalupe Maple, MD Taking Active   glucose blood (ACCU-CHEK AVIVA) test strip [id number] Yes Use as instructed Guadalupe Maple, MD Taking Active   guaiFENesin (MUCINEX) 600 MG 12 hr tablet [id number] Yes Take 1 tablet (600 mg total) by mouth 2 (two) times daily as needed. Volney American, [place] Taking Active   insulin NPH Human (HUMULIN N,NOVOLIN N) 100 UNIT/ML injection [id number] Yes Inject 0.5 mLs (50 Units total) into the skin 2 (two) times daily before a meal. 35 units twice a day. Guadalupe Maple, MD Taking Active            Med Note De Hollingshead   Tue Jul 27, 2018  1:37 PM) 50 units bid breakfast and lunch, total 100 units   metFORMIN (GLUCOPHAGE-XR) 500 MG 24 hr tablet [id number] Yes Take 2 tablets (1,000 mg total) by mouth 2 (two) times daily. Guadalupe Maple, MD Taking Active   metoprolol tartrate (LOPRESSOR) 25 MG tablet [id number] Yes Take 1 tablet (25 mg total) by mouth 2 (two) times daily. Guadalupe Maple, MD Taking Active   Multiple Vitamin (MULTIVITAMIN) capsule [id number] Yes Take 1 capsule by mouth daily. [provider] Taking Active            Med Note Darnelle Maffucci, Arville Lime   Tue Jul 27, 2018  1:38 PM) Centrum Senior, does not take every day  nitroGLYCERIN (NITROSTAT) 0.4 MG SL tablet [id number]  Place 1 tablet (0.4 mg total) under the tongue every 5 (five) minutes as needed for chest pain. Johnson, Megan P, DO  Active   ticagrelor (BRILINTA) 90 MG TABS tablet [id number] No Take 1 tablet (90 mg total) by mouth 2 (two) times daily.  Patient not taking:  Reported on 07/27/2018   Guadalupe Maple, MD Not Taking Active   traMADol (ULTRAM) 50 MG tablet [id number] No Take 1 tablet (50 mg total) by mouth every 8 (eight) hours as needed.  Patient not taking:  Reported on 07/27/2018   Volney American, PA-C Not Taking Active          Assessment:   Goals Addressed            This Visit's Progress     Patient Stated   . "We can't afford his insulin" (pt-stated)       Current Barriers:   Patient and his wife report that he has ~1 vial of insulin NPH at home right now . Financial Barriers  o Reports household income 1,065/mo + 850/mo  = 22,980/year . Patient reports blood glucose improved with Invokana  o Blood glucose running 140-190 2 hours after a meal, previously was consistently in the 200s   . Hypoglycemia events reporting blood glucose as low as 40-50 about 2 nights per week  o Patient experiences dizzy, sweating, weakness, this wakes him up  o He treats these events by eating peanut butter  crackers, sandwiches, or drinks a sweat drink   o Previously was approved for Partial Low Income Subsidy - therefore, qualifies for [employer] Patient Assistance, but not [employer] patient assistance  Pharmacist Clinical Goal(s):  [name] Over the next 14 days, patient will work with PharmD to address needs related to patient assistance   Interventions: . Comprehensive medication review performed. . Discussed plans with patient for ongoing care management follow up and provided patient with direct contact information for care management team  . Collaborated with provider in clinic today, Dr. Wynetta Emery, to have a prescription for [place] sent in to the pharmacy for this patient, as we could use [name] program. Contacted [name] with the patient's wife on the line; as the patient doesn't have an email address, Ralph Leyden will mail me the patient assistance card and I will call it into the pharmacy tomorrow morning. For Basaglar dosing, recommend significant dose reduction, 60 units once daily, due to AM hypoglycemia on current NPH regimen . Will collaborate with PCP Dr. Jeananne Rama for patient assistance for expensive medications o Patient qualifies for [place] assistance. Will collaborate with PCP to complete patient assistance applications for [place] through [employer] o Patient does NOT qualify for Invokana patient assistance, but does qualify for [employer] assistance through [employer]. He received BI assistance in 2019. Will collaborate with Dr. Jeananne Rama to see if OK to switch Invokana to [facility] and apply for patient assistance.   Patient Self Care Activities:  . Self administers medications as prescribed . Calls provider office for new concerns or questions  . Complete the patient portion of patient assistance and provide proof of income for the application  . Review pharmacy expenditures and if out of pocket expense is greater than $600 can more forward to  seek assistance with Trelegy inhaler   Initial goal documentation        Plan: - Will collaborate with  PCP Dr. Jeananne Rama on the patient assistance plans noted above - Will contact patient tomorrow to follow up on immediate supply of [place], PharmD Clinical Pharmacist [place] [phone]

## 2018-07-28 ENCOUNTER — Other Ambulatory Visit: Payer: Self-pay | Admitting: Family Medicine

## 2018-07-28 ENCOUNTER — Ambulatory Visit: Payer: Self-pay | Admitting: Pharmacist

## 2018-07-28 DIAGNOSIS — IMO0002 Reserved for concepts with insufficient information to code with codable children: Secondary | ICD-10-CM

## 2018-07-28 DIAGNOSIS — E1165 Type 2 diabetes mellitus with hyperglycemia: Secondary | ICD-10-CM | POA: Diagnosis not present

## 2018-07-28 DIAGNOSIS — E1129 Type 2 diabetes mellitus with other diabetic kidney complication: Secondary | ICD-10-CM

## 2018-07-28 MED ORDER — EMPAGLIFLOZIN 25 MG PO TABS: 25.0000 mg | ORAL_TABLET | Freq: Every day | ORAL | 3 refills | Status: DC

## 2018-07-28 NOTE — Patient Instructions (Signed)
Visit Information  Goals Addressed            This Visit's Progress     Patient Stated   . "We can't afford his insulin" (pt-stated)       Current Barriers:   Patient and his wife report that he has ~1 vial of insulin NPH at home right now . Financial Barriers  o Reports household income 1,065/mo + 850/mo  = 22,980/year . Patient reports blood glucose improved with Invokana  o Blood glucose running 140-190 2 hours after a meal, previously was consistently in the 200s   . Hypoglycemia events reporting blood glucose as low as 40-50 about 2 nights per week  o Patient experiences dizzy, sweating, weakness, this wakes him up  o He treats these events by eating peanut butter crackers, sandwiches, or drinks a sweat drink   o Previously was approved for Partial Low Income Subsidy - therefore, qualifies for [employer] Patient Assistance, but not [employer] patient assistance. Also qualifies for Boeh[employer]gel[employer]) patient assistance, but not J&J (Invokana)  Pharmacist Clinical Goal(s):  [name] Over the next 14 days, patient will work with PharmD to address needs related to patient assistance   Interventions:  Received [name] Diabetes Solution free 30 day supply coupon in my email (as patient does not have an email). Called [facility], provided this information to the pharmacy. Ran through 30 day supply for $0 copay.  Contacted patient; explained the switch to [place] [employer], educated on once daily use vs previous twice daily with insulin NPH. Explained new dosing. Patient verbalized understanding.  Started patient assistance applications for [employer] Psychologist, sport and exercise) and [employer]ringer Ingel[employer] [place]). Will leave provider portion for Dr. Christell Faith signature. Will collaborate with [facility] pharmacy technician Pattricia Boss, CPhT to mail the patient portion with return envelope to the patient  Patient Self Care Activities:  . Self administers medications as prescribed . Calls provider office for new  concerns or questions  . Complete the patient portion of patient assistance and provide proof of income for the application  . Review pharmacy expenditures and if out of pocket expense is greater than $600 can more forward to seek assistance with Trelegy inhaler   Please see past updates related to this goal by clicking on the "Past Updates" button in the selected goal         The patient verbalized understanding of instructions provided today and declined a print copy of patient instruction materials.     Plan: - Once provider and patient portions are received, Noreene Larsson Simcox, CPhT will submit to respective patient assistance companies and follow up - Will follow up with patient and his wife next week regarding switch from insulin NPH to Basaglar, evaluate BG results, and recommend dose adjustments as needed  Catie Feliz Beam, PharmD Clinical Pharmacist [facility] [phone]

## 2018-07-28 NOTE — Chronic Care Management (AMB) (Signed)
  Chronic Care Management   Follow Up Note   07/28/2018 Name: Benjamin Chan MRN: [id number] DOB: [birth year]  Referred by: Steele Sizer, MD Reason for referral : Chronic Care Management (Medication Access)   Benjamin Chan is a 73 y.o. year old male who is a primary care patient of [name], [name], MD. The CCM team was consulted for assistance with chronic disease management and care coordination needs.    Care Coordination completed today. Review of patient status, including review of consultants reports, relevant laboratory and other test results, and collaboration with appropriate care team members and the patient's provider was performed as part of comprehensive patient evaluation and provision of chronic care management services.    Goals Addressed            This Visit's Progress     Patient Stated   . "We can't afford his insulin" (pt-stated)       Current Barriers:   Patient and his wife report that he has ~1 vial of insulin NPH at home right now . Financial Barriers  o Reports household income 1,065/mo + 850/mo  = 22,980/year . Patient reports blood glucose improved with Invokana  o Blood glucose running 140-190 2 hours after a meal, previously was consistently in the 200s   . Hypoglycemia events reporting blood glucose as low as 40-50 about 2 nights per week  o Patient experiences dizzy, sweating, weakness, this wakes him up  o He treats these events by eating peanut butter crackers, sandwiches, or drinks a sweat drink   o Previously was approved for Partial Low Income Subsidy - therefore, qualifies for [employer] Buy Patient Assistance, but not [employer] patient assistance. Also qualifies for [employer]ringer [employer]) patient assistance, but not J&J (Invokana)  Pharmacist Clinical Goal(s):  [name] Over the next 14 days, patient will work with PharmD to address needs related to patient assistance   Interventions:  Received [name] Diabetes Solution free 30 day  supply coupon in my email (as patient does not have an email). Called [facility], provided this information to the pharmacy. Ran through 30 day supply for $0 copay.  Contacted patient; explained the switch to [place] [employer], educated on once daily use vs previous twice daily with insulin NPH. Explained new dosing. Patient verbalized understanding.  Started patient assistance applications for [employer] Psychologist, sport and exercise) and [employer] In[employer] [place]). Will leave provider portion for Dr. Christell Faith signature. Will collaborate with [facility] pharmacy technician Pattricia Boss, CPhT to mail the patient portion with return envelope to the patient  Patient Self Care Activities:  . Self administers medications as prescribed . Calls provider office for new concerns or questions  . Complete the patient portion of patient assistance and provide proof of income for the application  . Review pharmacy expenditures and if out of pocket expense is greater than $600 can more forward to seek assistance with Trelegy inhaler   Please see past updates related to this goal by clicking on the "Past Updates" button in the selected goal         Plan: - Once provider and patient portions are received, Noreene Larsson Simcox, CPhT will submit to respective patient assistance companies and follow up - Will follow up with patient and his wife next week regarding switch from insulin NPH to Basaglar, evaluate BG results, and recommend dose adjustments as needed  Catie Feliz Beam, PharmD Clinical Pharmacist [facility] [phone]

## 2018-07-29 ENCOUNTER — Other Ambulatory Visit: Payer: Self-pay | Admitting: Pharmacy Technician

## 2018-07-29 NOTE — Patient Outreach (Signed)
[facility]) Care Management  07/29/2018  Benjamin Chan 1945/07/08 [id number]                           Medication Assistance Referral  Referral From: [facility] RPh Catie T. (Clinic RPh)  Medication/Company: Mariella Saa / [name] Patient application portion:  Mailed Provider application portion:  N/A Catie got signed while at office to Dr. Vonita Moss  Medication/Company: [place] Pepper / BI Patient application portion:  Mailed Provider application portion:  N/A Catie got signed while at office to Dr. Vonita Moss   Follow up:  Will follow up with patient in 5-10 business days to confirm application(s) have been received.  Aleeah Greeno P. Trease B[place]ner, CPhT [facility] [phone]

## 2018-07-30 ENCOUNTER — Telehealth: Payer: Self-pay

## 2018-08-03 ENCOUNTER — Other Ambulatory Visit: Payer: Self-pay

## 2018-08-03 ENCOUNTER — Ambulatory Visit: Payer: Medicare HMO | Admitting: Pharmacist

## 2018-08-03 DIAGNOSIS — E1129 Type 2 diabetes mellitus with other diabetic kidney complication: Secondary | ICD-10-CM

## 2018-08-03 DIAGNOSIS — IMO0002 Reserved for concepts with insufficient information to code with codable children: Secondary | ICD-10-CM

## 2018-08-03 NOTE — Chronic Care Management (AMB) (Signed)
Chronic Care Management   Follow Up Note   08/03/2018 Name: Benjamin Chan MRN: [id number] DOB: [birth year]  Referred by: Benjamin Sizer, MD Reason for referral : No chief complaint on file.   Benjamin Chan is a 73 y.o. year old male who is a primary care patient of Benjamin Chan, Benjamin Gainer, MD. The CCM team was consulted for assistance with chronic disease management and care coordination needs.    Contacted patient to follow up on switch to Basaglar insulin.  Review of patient status, including review of consultants reports, relevant laboratory and other test results, and collaboration with appropriate care team members and the patient's provider was performed as part of comprehensive patient evaluation and provision of chronic care management services.    Goals Addressed            This Visit's Progress     Patient Stated   . "We can't afford his insulin" (pt-stated)       Current Barriers:  . Financial Barriers  o Reports household income 1,065/mo + 850/mo  = 22,980/year o Previously was approved for Partial Low Income Subsidy - therefore, qualifies for Best Buy Patient Assistance, but not [employer] patient assistance. Also qualifies for [employer]gel[employer]) patient assistance, but not J&J (Invokana) . Denies any hypoglycemic events since starting the Basaglar at 56 units daily. Reports he has hyoglycemic awareness. o Previously was experiencing hypoglycemia events and reporting blood glucose as low as 40-50 about 2 nights per week  o Patient states he experiences dizzy, sweating, weakness when his blood glucose goes low and it wakes him up  . Patient reports blood glucose previously improved with Invokana. Blood glucose running 140-190 2 hours after a meal, previously was consistently in the 200s. However his glucometer needs a new battery so he has not checked his blood glucose since he started the [employer].  Pharmacist Clinical Goal(s):  [name] Over the next 14  days, patient will work with PharmD to address needs related to patient assistance   Interventions:  Followed up with patient to see how he was doing with the switch to Basaglar and made sure he was using the correct dose. Patient verbalized understanding.   Started patient assistance applications for [employer] Psychologist, sport and exercise) and Boehringer In[employer] [place]). Obtained provider portion for Dr. Christell Chan signature. [facility] pharmacy technician Pattricia Boss, CPhT mailed the patient portion with return envelope to the patient last Thursday. Patient has not received yet but will be on the look out for application.   Patient Self Care Activities:  . Self administers medications as prescribed . Calls provider office for new concerns or questions  . Complete the patient portion of patient assistance and provide proof of income for the application  . Review pharmacy expenditures and if out of pocket expense is greater than $600 can more forward to seek assistance with Trelegy inhaler  . Obtain battery for glucometer and check blood glucose at least twice a day, fasting and post-prandial  Please see past updates related to this goal by clicking on the "Past Updates" button in the selected goal         Plan: - Once patient portions are received, Benjamin Chan, CPhT will submit to respective patient assistance companies and follow up - Will follow up with patient and his wife next week to follow up with blood glucose readings reguarding switch from insulin NPH to Basaglar and recommend dose adjustments as needed  Benjamin Chan, PharmD Clinical Pharmacist [facility]  [phone] 

## 2018-08-03 NOTE — Patient Instructions (Signed)
Visit Information  Goals Addressed            This Visit's Progress     Patient Stated   . "We can't afford his insulin" (pt-stated)       Current Barriers:  . Financial Barriers  o Reports household income 1,065/mo + 850/mo  = 22,980/year o Previously was approved for Partial Low Income Subsidy - therefore, qualifies for [employer] Patient Assistance, but not [employer] patient assistance. Also qualifies for Boehringer [employer]gel[employer]) patient assistance, but not J&J (Invokana) . Denies any hypoglycemic events since starting the Basaglar at 56 units daily. Reports he has hypoglycemic awareness. o Previously was experiencing hypoglycemia events and reporting blood glucose as low as 40-50 about 2 nights per week  o Patient states he experiences dizzy, sweating, weakness when his blood glucose goes low and it wakes him up  . Reports that his glucometer needs a new battery so he has not checked his blood glucose since he started the [employer].  Pharmacist Clinical Goal(s):  [name] Over the next 14 days, patient will work with PharmD to address needs related to patient assistance   Interventions:  Followed up with patient to see how he was doing with the switch to Basaglar and made sure he was using the correct dose. Patient verbalized understanding.   Started patient assistance applications for [employer] Psychologist, sport and exercise) and Boehringer Ingelhem [place]). Obtained provider portion for Dr. Christell Faith signature. [facility] pharmacy technician Pattricia Boss, CPhT mailed the patient portion with return envelope to the patient last Thursday. Patient has not received yet but will be on the look out for application.   Patient Self Care Activities:  . Self administers medications as prescribed . Calls provider office for new concerns or questions  . Complete the patient portion of patient assistance and provide proof of income for the application  . Review pharmacy expenditures and if out of pocket expense is  greater than $600 can more forward to seek assistance with Trelegy inhaler  . Obtain battery for glucometer and check blood glucose at least twice a day, fasting and post-prandial  Please see past updates related to this goal by clicking on the "Past Updates" button in the selected goal         The patient verbalized understanding of instructions provided today and declined a print copy of patient instruction materials.   Plan: - Once patient portions are received, Noreene Larsson Simcox, CPhT will submit to respective patient assistance companies and follow up - Will follow up with patient and his wife next week to follow up with blood glucose readings reguarding switch from insulin NPH to Basaglar and recommend dose adjustments as needed  Catie Feliz Beam, PharmD Clinical Pharmacist [facility] [phone]

## 2018-08-05 ENCOUNTER — Telehealth: Payer: Self-pay

## 2018-08-05 ENCOUNTER — Other Ambulatory Visit: Payer: Self-pay | Admitting: Family Medicine

## 2018-08-05 MED ORDER — INSULIN PEN NEEDLE 31G X 5 MM MISC: 1.0000 "pen " | Freq: Every morning | 12 refills | Status: DC

## 2018-08-05 MED ORDER — INSULIN PEN NEEDLE 31G X 5 MM MISC: 1.0000 [IU] | Freq: Every morning | 12 refills | Status: DC

## 2018-08-05 NOTE — Telephone Encounter (Signed)
Sent to drug store. 

## 2018-08-05 NOTE — Telephone Encounter (Signed)
Pen needles for [employer]

## 2018-08-10 ENCOUNTER — Other Ambulatory Visit: Payer: Self-pay | Admitting: Pharmacy Technician

## 2018-08-10 NOTE — Patient Outreach (Signed)
[facility]) Care Management  08/10/2018  Benjamin Chan 03-30-1945 [id number]    Successful outreach call placed to patient in regards to [name] application for [employer] and BI application for Jardiance.  Spoke to patient, HIPAA identifiers verified.  Patient informed that he has received the applications in the mail but they have not been mailed back yet. Patient informed his wife has plans on placing them in the mail today.  Will follow up with patient in 10-14 business days if applications have not been received.  Arisbel Maione P. Geovanie Winnett, CPhT [facility] [phone]

## 2018-08-18 DIAGNOSIS — I214 Non-ST elevation (NSTEMI) myocardial infarction: Secondary | ICD-10-CM | POA: Diagnosis not present

## 2018-08-18 DIAGNOSIS — I25810 Atherosclerosis of coronary artery bypass graft(s) without angina pectoris: Secondary | ICD-10-CM | POA: Diagnosis not present

## 2018-08-18 DIAGNOSIS — I10 Essential (primary) hypertension: Secondary | ICD-10-CM | POA: Diagnosis not present

## 2018-08-18 DIAGNOSIS — E782 Mixed hyperlipidemia: Secondary | ICD-10-CM | POA: Diagnosis not present

## 2018-08-27 ENCOUNTER — Other Ambulatory Visit: Payer: Self-pay | Admitting: Pharmacy Technician

## 2018-08-27 NOTE — Patient Outreach (Signed)
[place] [facility]) Care Management  08/27/2018  Benjamin Chan 1945-05-20 [id number]    Care coordination call placed to patient in regards to [facility] application for [place] application for [employer].  Spoke to patient's wife, HIPAA identifiers verified.  Patient informed she had mailed back the information in separate envelopes. Informed patient I received the financial documentation but not the applications. Patient informed she mailed them back at the same time. Informed patient's wife that our mail goes to [facility] and is sorted out. Informed patient I would be watching for it.   Will followup with patient in 10-14 business days if not received.  Jafar Poffenberger P. Arieon Corcoran, [place] Management [phone]

## 2018-09-07 ENCOUNTER — Other Ambulatory Visit: Payer: Self-pay | Admitting: Pharmacy Technician

## 2018-09-07 NOTE — Patient Outreach (Signed)
[place] [facility]) Care Management  09/07/2018  Benjamin Chan 1946/03/13 [id number]    Successful outreach call placed to patient in regards to [facility] application for Jardiance.   Spoke to patient's wife, HIPAA identifiers verified.  Patient's wife informed she did not realize she received 2 different application and inquired if we could mail the 2nd one out again. Informed patient I would mail it out 09/08/2018 and will followup with her in 5-10 days to inquire if she received it.  Received all the necessary documents and signatures to submit the completed [name] application via fax for patient's Basaglar.   Will followup with [name] in 5-7 business days to inquire on status of application.  Benjamin Chan, [name] Management [phone]

## 2018-09-15 ENCOUNTER — Other Ambulatory Visit: Payer: Self-pay | Admitting: Pharmacy Technician

## 2018-09-15 NOTE — Patient Outreach (Signed)
[place] [facility]) Care Management  09/15/2018  Benjamin Chan 1945-11-24 [id number]  Care coordination call placed to [name] in regards to patient's application for [place].  Spoke to [name] who informed patient was APPROVED for the program 09/09/2018-03/17/2019. [name] informed she would transfer me to [name] for delivery information. Spoke to [name] at [name] who informed they had received the information and prescription from [name] and that the medication was ready for shipment. She informed they would reach out to patient today to set up shipment.  Will followup with patient in 7-14 business days to confirm receipt of medication.  [name] P. [name], [place] Management [phone]

## 2018-09-15 NOTE — Patient Outreach (Signed)
[place] [facility]) Care Management  09/15/2018  KABE MCKOY 04-12-1945 [id number]  ADDENDUM  Successful outreach call placed to patient in regards to [place] application for [place] and [place] application for Jardiance.  Spoke to patient's wife, HIPAA identifiers verified.  Informed patient's wife that he had been approved for the program and that the medication was ready for shipping. Informed her to have patient call [facility] to set up delivery and provided her with the phone number.  Inquired if patient had received the BI application for Jardiance.Patient's wife informed she had received the application and had mailed it back this week.  Will followup with patient in 7-14 business days.  Refugio Mcconico P. Izadora Roehr, [place] Management [phone]

## 2018-09-20 ENCOUNTER — Other Ambulatory Visit: Payer: Self-pay | Admitting: Pharmacy Technician

## 2018-09-20 NOTE — Patient Outreach (Signed)
[place] [facility]) Care Management  09/20/2018  Benjamin Chan 1946/01/14 [id number]   Care coordination call placed to [name] in regards to [facility] prescription with [facility].   Spoke to [name] who informed medication is set to be delivered on 09/22/2018.  Will followup with patient in 5-7 business days to confirm receipt of medication.  Ceci Taliaferro P. Barby Colvard, [place] Management [phone]

## 2018-09-23 ENCOUNTER — Other Ambulatory Visit: Payer: Self-pay | Admitting: Pharmacy Technician

## 2018-09-23 NOTE — Patient Outreach (Signed)
[place] [facility]) Care Management  09/23/2018  Benjamin Chan September 15, 1945 [id number]    Successful outreach call placed to patient in regards to [place] application for [employer].  Spoke to patient's wife, HIPAA identifiers verified.  Patient's wife informed they received 5 boxes of Basaglar. Discussed refill procedure with patient's wife and she verbalized understanding.  Still awaiting the application for BI for Jardiance. Once that application is received then it will be submitted to [facility].  Will followup with patient in 10-14 business days if application has not been received.  Kayti Poss P. Fujie Dickison, [place] Management [phone]

## 2018-09-24 ENCOUNTER — Ambulatory Visit (INDEPENDENT_AMBULATORY_CARE_PROVIDER_SITE_OTHER): Payer: Medicare HMO | Admitting: Pharmacist

## 2018-09-24 DIAGNOSIS — E1129 Type 2 diabetes mellitus with other diabetic kidney complication: Secondary | ICD-10-CM | POA: Diagnosis not present

## 2018-09-24 DIAGNOSIS — E1165 Type 2 diabetes mellitus with hyperglycemia: Secondary | ICD-10-CM | POA: Diagnosis not present

## 2018-09-24 DIAGNOSIS — I10 Essential (primary) hypertension: Secondary | ICD-10-CM | POA: Diagnosis not present

## 2018-09-24 DIAGNOSIS — IMO0002 Reserved for concepts with insufficient information to code with codable children: Secondary | ICD-10-CM

## 2018-09-24 NOTE — Patient Instructions (Signed)
Visit Information  Goals Addressed            This Visit's Progress     Patient Stated   . "He needs to track his health better" (pt-stated)       Current Barriers:  . Cardiovascular history, pertinent for CAD s/p NSTEMI 1+ years ago, HTN, HLD. Followed by Dr. Gwen Pounds. Last appointment 08/17/2018.  o Recommended d/c clopidogrel as at least one year out from event. Continued ASA 81 daily.  o Recommended to consider intensification of statin if patient adherent to statin therapy, as last LDL 128, though this was 06/2017 and patient has since filled atorvastatin appropriately on 04/07/2018 and 07/12/2018 per refill hx.  . Wife reports that he checks BP, but very infrequenty. . Reports that their granddaughter comes to fill pill box weekly for patient.  Pharmacist Clinical Goal(s):  [name] Over the next 90 days, patient will work with PharmD and primary care provider to address needs related to optimized cardiovascular risk reduction  Interventions: . Comprehensive medication review performed. Updated medication list in Epic as appropriate.  . Patient's wife confirmed that they d/c clopidogrel as per cardiology recommendation . At next set of lab work, recommend checking lipid panel, and increasing atorvastatin to 80 mg daily if LDL remains >70 . Encouraged patient to check BP a few days per week, and record. Provided with BP/BG log allow for easy review of readings at next [facility] pharmacy phone call.   Patient Self Care Activities:  . Calls pharmacy for medication refills . Calls provider office for new concerns or questions  Initial goal documentation     . "We want to keep his blood sugars better controlled" (pt-stated)       Current Barriers:  . Financial Barriers  o Reports household income 1,065/mo + 850/mo  = 22,980/year o Previously was approved for Partial Low Income Subsidy - therefore, qualifies for [employer] Patient Assistance, but not [employer] patient assistance. Also qualifies for  [employer]) patient assistance, but not J&J (Invokana). APPROVED for [name] patient assistance through 03/17/2019 for Basaglar; received first shipment earlier this week.  [name] Uncontrolled diabetes, last A1c 11.5%; though likely improved per recent report of fasting BG being ~150-160s  o Denies hypoglycemia; previously experiencing a few nights per week when previously on NPH regimen o Currently taking metformin XR 1000 mg BID, glipizide 5 mg QAM, Basaglar 56 units daily, and pursuing patient assistance for Jardiance to replace Invokana. Awaiting patient portion of application for Jardiance; patient's wife notes that she received it, but it was returned to her.   Pharmacist Clinical Goal(s):  [name] Over the next 45 days, patient will work with PharmD to address needs related to patient assistance   Interventions:  Recommended that patient's wife drop off patient portion of [employer] application for Jardiance at [facility], and I will retrieve it from there. She verbalized understanding. Once I receive it, I will pass along to [facility], CPhT for submission and follow up  Praised patient on improvement in BG. Encouraged to begin to check fasting blood sugars daily. Will provide patient with a blood sugar/blood pressure log to track readings and allow for easy review during future CCM visits.   Patient Self Care Activities:  . Self administers medications as prescribed . Calls provider office for new concerns or questions  . Complete the patient portion of patient assistance and provide proof of income for the application  . Review pharmacy expenditures and if out of  pocket expense is greater than $600 can more forward to seek assistance with Trelegy inhaler    Please see past updates related to this goal by clicking on the "Past Updates" button in the selected goal         The patient verbalized understanding of instructions provided today and  declined a print copy of patient instruction materials.   Plan:  - PharmD will outreach patient again in 3-4 weeks for continued medication management support  [name], PharmD Clinical Pharmacist [place] [phone]

## 2018-09-24 NOTE — Chronic Care Management (AMB) (Signed)
Chronic Care Management   Follow Up Note   09/24/2018 Name: Benjamin Chan MRN: [id number] DOB: 05/02/1945  Referred by: Steele Sizer, MD Reason for referral : Chronic Care Management (Medication Management)   Benjamin Chan is a 73 y.o. year old male who is a primary care patient of [name], [name], MD. The CCM team was consulted for assistance with chronic disease management and care coordination needs.    Contacted patient to follow up on medication management.   Review of patient status, including review of consultants reports, relevant laboratory and other test results, and collaboration with appropriate care team members and the patient's provider was performed as part of comprehensive patient evaluation and provision of chronic care management services.    Goals Addressed            This Visit's Progress     Patient Stated   . "He needs to track his health better" (pt-stated)       Current Barriers:  . Cardiovascular history, pertinent for CAD s/p NSTEMI 1+ years ago, HTN, HLD. Followed by Dr. Gwen Pounds. Last appointment 08/17/2018.  o Recommended d/c clopidogrel as at least one year out from event. Continued ASA 81 daily.  o Recommended to consider intensification of statin if patient adherent to statin therapy, as last LDL 128, though this was 06/2017 and patient has since filled atorvastatin appropriately on 04/07/2018 and 07/12/2018 per refill hx.  . Wife reports that he checks BP, but very infrequenty. . Reports that their granddaughter comes to fill pill box weekly for patient.  Pharmacist Clinical Goal(s):  [name] Over the next 90 days, patient will work with PharmD and primary care provider to address needs related to optimized cardiovascular risk reduction  Interventions: . Comprehensive medication review performed. Updated medication list in Epic as appropriate.  . Patient's wife confirmed that they d/c clopidogrel as per cardiology recommendation . At next  set of lab work, recommend checking lipid panel, and increasing atorvastatin to 80 mg daily if LDL remains >70 . Encouraged patient to check BP a few days per week, and record. Provided with BP/BG log allow for easy review of readings at next [facility] pharmacy phone call.   Patient Self Care Activities:  . Calls pharmacy for medication refills . Calls provider office for new concerns or questions  Initial goal documentation     . "We want to keep his blood sugars better controlled" (pt-stated)       Current Barriers:  . Financial Barriers  o Reports household income 1,065/mo + 850/mo  = 22,980/year o Previously was approved for Partial Low Income Subsidy - therefore, qualifies for [employer] Patient Assistance, but not [employer] patient assistance. Also qualifies for [employer]) patient assistance, but not [employer] (Invokana). APPROVED for [name] patient assistance through 03/17/2019 for Basaglar; received first shipment earlier this week.  [name] Uncontrolled diabetes, last A1c 11.5%; though likely improved per recent report of fasting BG being ~150-160s  o Denies hypoglycemia; previously experiencing a few nights per week when previously on NPH regimen o Currently taking metformin XR 1000 mg BID, glipizide 5 mg QAM, Basaglar 56 units daily, and pursuing patient assistance for Jardiance to replace Invokana. Awaiting patient portion of application for Jardiance; patient's wife notes that she received it, but it was returned to her.   Pharmacist Clinical Goal(s):  [name] Over the next 45 days, patient will work with PharmD to address needs related to patient assistance   Interventions:  Recommended that patient's  wife drop off patient portion of [employer] application for Jardiance at [facility], and I will retrieve it from there. She verbalized understanding. Once I receive it, I will pass along to [facility], CPhT for submission and follow up  Praised patient on  improvement in BG. Encouraged to begin to check fasting blood sugars daily. Will provide patient with a blood sugar/blood pressure log to track readings and allow for easy review during future CCM visits.   Patient Self Care Activities:  . Self administers medications as prescribed . Calls provider office for new concerns or questions  . Complete the patient portion of patient assistance and provide proof of income for the application  . Review pharmacy expenditures and if out of pocket expense is greater than $600 can more forward to seek assistance with Trelegy inhaler    Please see past updates related to this goal by clicking on the "Past Updates" button in the selected goal          Plan:  - PharmD will outreach patient again in 3-4 weeks for continued medication management support  [name], PharmD Clinical Pharmacist [place] [phone]

## 2018-09-27 ENCOUNTER — Other Ambulatory Visit: Payer: Self-pay | Admitting: Unknown Physician Specialty

## 2018-09-27 ENCOUNTER — Other Ambulatory Visit: Payer: Self-pay | Admitting: Family Medicine

## 2018-10-04 ENCOUNTER — Other Ambulatory Visit: Payer: Self-pay | Admitting: Pharmacy Technician

## 2018-10-04 NOTE — Patient Outreach (Signed)
[place]) Care Management  10/04/2018  Benjamin Chan January 06, 1946 [id number]   Successful outreach call placed to patient in regards to [facility] application for Jardiance.  Spoke to patient's wife, HIPAA identifiers verified.  Patient's wife informed that she is going to try and drop off the application at Dr. Rance Muir office today as she forgot to do so last week.  Will followup with patient in 5-7 business days if application has not been received.  [name] P. Darenda Fike, [place] Management [phone]

## 2018-10-07 ENCOUNTER — Other Ambulatory Visit: Payer: Self-pay | Admitting: Pharmacy Technician

## 2018-10-07 NOTE — Patient Outreach (Signed)
[facility]) Care Management  10/07/2018  Benjamin Chan [birth year] [id number]   Received all necessary documents and signatures for BI patient assistance for Jardiance.  Submitted completed application via fax.  Will followup with BI in 7-10 business days.  [name] P. [name], [place] Management [phone]

## 2018-10-19 ENCOUNTER — Ambulatory Visit (INDEPENDENT_AMBULATORY_CARE_PROVIDER_SITE_OTHER): Payer: Medicare HMO | Admitting: Pharmacist

## 2018-10-19 DIAGNOSIS — IMO0002 Reserved for concepts with insufficient information to code with codable children: Secondary | ICD-10-CM

## 2018-10-19 DIAGNOSIS — E1129 Type 2 diabetes mellitus with other diabetic kidney complication: Secondary | ICD-10-CM | POA: Diagnosis not present

## 2018-10-19 DIAGNOSIS — E1165 Type 2 diabetes mellitus with hyperglycemia: Secondary | ICD-10-CM

## 2018-10-19 NOTE — Patient Instructions (Signed)
Visit Information  Goals Addressed            This Visit's Progress     Patient Stated   . "We want to keep his blood sugars better controlled" (pt-stated)       Current Barriers:  . Diabetes: uncontrolled though improved per SMBG results, most recent A1c 11.5%   o APPROVED for [name] patient assistance for Trulicity through 64/40/3474; TEMPORARILY APPROVED for 90 days of Jardiance assistance; received fax today that patient "may qualify for LIS" so they did not give full year approval. Will need to submit proof of LIS denial/partial LIS with proof of copay.  . Current antihyperglycemic regimen: Basaglar 56 units QAM, glipizide 5 mg QAM, metformin 1000 mg BID, Jardiance 25 mg - though patient notes that cost jumped to $85 and he is no longer able to afford . Denies hypoglycemic symptoms, including dizziness, lightheadedness, shaking, sweating . Current blood glucose readings: Fasting 150-160s per wife report  . Cardiovascular risk reduction: o Current hypertensive regimen: enalapril 20 mg BID, doxazosin 2 mg daily, metoprolol tartrate 25 mg BID; BP at goal <130/80 on last check o Current hyperlipidemia regimen: atorvastatin 80 mg daily; last LDL not at goal; would recommend recheck as patient medication fill history indicates adherence  Pharmacist Clinical Goal(s):  [name] Over the next 90 days, patient with work with PharmD and primary care provider to address optimized medication management  Interventions: . Patient's wife did not think she had the Partial LIS award letter. With patient's permission, collaboratively completed Medicare Extra Help/LIS application online. Patient's wife aware that determination can take 4-6 weeks, and will come in the mail . Meanwhile, they will ask [place] to print proof of copay for Jardiance to be included in appeals application. She will bring both of the above documents to clinic for me once received.  . Congratulated patient on continued BG  reduction  Patient Self Care Activities:  . Patient will check blood glucose BID , document, and provide at future appointments . Patient will take medications as prescribed . Patient will report any questions or concerns to provider   Please see past updates related to this goal by clicking on the "Past Updates" button in the selected goal          The patient verbalized understanding of instructions provided today and declined a print copy of patient instruction materials.    Plan:  - Will outreach patient in 4-5 weeks for continued medication management support  [name], PharmD Clinical Pharmacist [place] [phone]

## 2018-10-19 NOTE — Chronic Care Management (AMB) (Signed)
  Chronic Care Management   Follow Up Note   10/19/2018 Name: Benjamin Chan MRN: [id number] DOB: 1945-03-27  Referred by: Guadalupe Maple, MD Reason for referral : Chronic Care Management (Medication Mangement)   Benjamin Chan is a 73 y.o. year old male who is a primary care patient of [name], [name], MD. The CCM team was consulted for assistance with chronic disease management and care coordination needs.    Spoke with patient and his wife telephonically today.  Review of patient status, including review of consultants reports, relevant laboratory and other test results, and collaboration with appropriate care team members and the patient's provider was performed as part of comprehensive patient evaluation and provision of chronic care management services.    Goals Addressed            This Visit's Progress     Patient Stated   . "We want to keep his blood sugars better controlled" (pt-stated)       Current Barriers:  . Diabetes: uncontrolled though improved per SMBG results, most recent A1c 11.5%   o APPROVED for [name] patient assistance for Trulicity through 02/54/2706; TEMPORARILY APPROVED for 90 days of Jardiance assistance; received fax today that patient "may qualify for LIS" so they did not give full year approval. Will need to submit proof of LIS denial/partial LIS with proof of copay.  . Current antihyperglycemic regimen: Basaglar 56 units QAM, glipizide 5 mg QAM, metformin 1000 mg BID, Jardiance 25 mg - though patient notes that cost jumped to $85 and he is no longer able to afford . Denies hypoglycemic symptoms, including dizziness, lightheadedness, shaking, sweating . Current blood glucose readings: Fasting 150-160s per wife report  . Cardiovascular risk reduction: o Current hypertensive regimen: enalapril 20 mg BID, doxazosin 2 mg daily, metoprolol tartrate 25 mg BID; BP at goal <130/80 on last check o Current hyperlipidemia regimen: atorvastatin 80 mg daily;  last LDL not at goal; would recommend recheck as patient medication fill history indicates adherence  Pharmacist Clinical Goal(s):  [name] Over the next 90 days, patient with work with PharmD and primary care provider to address optimized medication management  Interventions: . Patient's wife did not think she had the Partial LIS award letter. With patient's permission, collaboratively completed Medicare Extra Help/LIS application online. Patient's wife aware that determination can take 4-6 weeks, and will come in the mail . Meanwhile, they will ask [place] to print proof of copay for Jardiance to be included in appeals application. She will bring both of the above documents to clinic for me once received.  . Congratulated patient on continued BG reduction  Patient Self Care Activities:  . Patient will check blood glucose BID , document, and provide at future appointments . Patient will take medications as prescribed . Patient will report any questions or concerns to provider   Please see past updates related to this goal by clicking on the "Past Updates" button in the selected goal           Plan:  - Will outreach patient in 4-5 weeks for continued medication management support  [name], PharmD Clinical Pharmacist [place] [phone]

## 2018-10-20 ENCOUNTER — Other Ambulatory Visit: Payer: Self-pay | Admitting: Pharmacy Technician

## 2018-10-20 NOTE — Patient Outreach (Signed)
[facility]) Care Management  10/20/2018  NAKEEM MURNANE [birth year] [id number]    Care coordination call placed to BI in regards to patient's Jardiance application.  Spoke to Quita Skye who informed patient was temporarily APPROVED 10/19/2018. Adam informed patient would receive a 90 days supply of medication delivered to their home in the next 7-10 business days. He informed the patient needs to complete an Extra Help/ LIS application. If denied then the enrollment can be extended but they have to submit the denial letter. If partial help is received then the letter along with the OOP spend of the medication via a pharmacy printout would need to be submitted. They may get an  enrollment extension only if the cost is greater than 3% of total income.  Per inbasket note patient completed application for LIS already. Awaiting results which could take 4-6 weeks.  Will followup with patient in 10-14 business days to inquire if medication was received.  Karan Inclan P. Jiayi Lengacher, [place] Management [phone]

## 2018-11-03 ENCOUNTER — Other Ambulatory Visit: Payer: Self-pay | Admitting: Family Medicine

## 2018-11-03 NOTE — Telephone Encounter (Signed)
Looks like pt is due for follow up blood work. Please advise

## 2018-11-04 ENCOUNTER — Other Ambulatory Visit: Payer: Self-pay | Admitting: Pharmacy Technician

## 2018-11-04 NOTE — Patient Outreach (Signed)
[facility] [facility]) Care Management  11/04/2018  KHA HARI 1945/04/22 [id number]    Successful outreach call placed to patient in regards to [facility] application for Jardiance.   Spoke to patient's wife, HIPAA identifiers verified.  Patient's wife informed that he did receive a 90 days supply of Jardiance. Informed patient that the enrollment could be extended based on the outcome of the LIS Extra Help determination. Patient's wife informed that they have not received a letter concerning the application that embedded [place] had help them with. Informed patient's wife that once she receives that letter then she would need to take it by the clinic and have them put it in [place] box. Once that letter is received then we continue with the application process. Patient's wife verbalized understanding.  Will route note to embedded [place] so that she can be aware.  Will followup with patient in 2-3 weeks to touch base about the LIS letter.  Seymore Brodowski P. Gail Vendetti, [place] Management [phone]

## 2018-11-24 ENCOUNTER — Ambulatory Visit (INDEPENDENT_AMBULATORY_CARE_PROVIDER_SITE_OTHER): Payer: Medicare HMO | Admitting: Pharmacist

## 2018-11-24 DIAGNOSIS — E785 Hyperlipidemia, unspecified: Secondary | ICD-10-CM

## 2018-11-24 DIAGNOSIS — E1129 Type 2 diabetes mellitus with other diabetic kidney complication: Secondary | ICD-10-CM | POA: Diagnosis not present

## 2018-11-24 DIAGNOSIS — IMO0002 Reserved for concepts with insufficient information to code with codable children: Secondary | ICD-10-CM

## 2018-11-24 DIAGNOSIS — E1165 Type 2 diabetes mellitus with hyperglycemia: Secondary | ICD-10-CM | POA: Diagnosis not present

## 2018-11-24 DIAGNOSIS — I10 Essential (primary) hypertension: Secondary | ICD-10-CM | POA: Diagnosis not present

## 2018-11-24 NOTE — Chronic Care Management (AMB) (Signed)
Chronic Care Management   Follow Up Note   11/24/2018 Name: KENNIETH WINIARSKI MRN: [id number] DOB: [birth year]  Referred by: Steele Sizer, MD Reason for referral : Chronic Care Management (Medication Management)   GAYLORD CARRINO is a 73 y.o. year old male who is a primary care patient of [name], [name], MD. The CCM team was consulted for assistance with chronic disease management and care coordination needs.    Spoke with patient telephonically for medication management review today.   Review of patient status, including review of consultants reports, relevant laboratory and other test results, and collaboration with appropriate care team members and the patient's provider was performed as part of comprehensive patient evaluation and provision of chronic care management services.    SDOH (Social Determinants of Health) screening performed today: Financial Strain  Physical Activity. See Care Plan for related entries.   Outpatient Encounter Medications as of 11/24/2018  Medication Sig Note  . amLODipine (NORVASC) 10 MG tablet Take 1 tablet by mouth once daily 09/24/2018: QAM  . aspirin 81 MG tablet Take 81 mg by mouth daily.   [name] atorvastatin (LIPITOR) 80 MG tablet Take 1 tablet (80 mg total) by mouth daily.   [name] doxazosin (CARDURA) 2 MG tablet Take 1 tablet (2 mg total) by mouth daily. 07/27/2018: HS  . empagliflozin (JARDIANCE) 25 MG TABS tablet Take 25 mg by mouth daily.   . enalapril (VASOTEC) 20 MG tablet Take 1 tablet by mouth twice daily   . finasteride (PROSCAR) 5 MG tablet Take 1 tablet by mouth once daily   . Fluticasone-Umeclidin-Vilant (TRELEGY ELLIPTA) 100-62.5-25 MCG/INH AEPB Inhale 1 puff into the lungs daily.   [name] glipiZIDE (GLUCOTROL) 5 MG tablet Take 1 tablet (5 mg total) by mouth daily before breakfast.   . glucose blood (ACCU-CHEK AVIVA) test strip Use as instructed   . Insulin Glargine (BASAGLAR KWIKPEN) 100 UNIT/ML SOPN Inject 0.56 mLs (56 Units total) into the  skin daily. 09/24/2018: ~noon  . Insulin Pen Needle (ADVOCATE INSULIN PEN NEEDLES) 31G X 5 MM MISC Inject 1 Units into the skin every morning.   . metFORMIN (GLUCOPHAGE-XR) 500 MG 24 hr tablet Take 2 tablets (1,000 mg total) by mouth 2 (two) times daily.   . metoprolol tartrate (LOPRESSOR) 25 MG tablet Take 1 tablet (25 mg total) by mouth 2 (two) times daily.   . Multiple Vitamin (MULTIVITAMIN) capsule Take 1 capsule by mouth daily. 07/27/2018: Centrum Senior, does not take every day  . [DISCONTINUED] Alcohol Swabs (B-D SINGLE USE SWABS REGULAR) PADS Use one swab each time blood sugar is checked to clean site prior to lancet use   . ACCU-CHEK FASTCLIX LANCETS MISC Use one lancet to check blood sugars between 1-4 times daily   . acetaminophen (TYLENOL) 325 MG tablet Take 650 mg by mouth every 6 (six) hours as needed.   [name] albuterol (PROVENTIL HFA;VENTOLIN HFA) 108 (90 Base) MCG/ACT inhaler Inhale 2 puffs into the lungs every 6 (six) hours as needed for wheezing or shortness of breath. (Patient not taking: Reported on 09/24/2018) 07/27/2018: Currently out   . albuterol (PROVENTIL) (2.5 MG/3ML) 0.083% nebulizer solution Take 3 mLs (2.5 mg total) by nebulization every 6 (six) hours as needed for wheezing or shortness of breath. 07/27/2018: Every morning and night   . fluticasone (FLONASE) 50 MCG/ACT nasal spray Place into both nostrils daily. Pt takes twice a day   . guaiFENesin (MUCINEX) 600 MG 12 hr tablet Take 1 tablet (600 mg total) by  mouth 2 (two) times daily as needed.   . nitroGLYCERIN (NITROSTAT) 0.4 MG SL tablet Place 1 tablet (0.4 mg total) under the tongue every 5 (five) minutes as needed for chest pain. (Patient not taking: Reported on 09/24/2018)   . traMADol (ULTRAM) 50 MG tablet Take 1 tablet (50 mg total) by mouth every 8 (eight) hours as needed. (Patient not taking: Reported on 07/27/2018)    No facility-administered encounter medications on file as of 11/24/2018.      Goals Addressed             This Visit's Progress     Patient Stated   . "He needs to track his health better" (pt-stated)       Current Barriers:  . Cardiovascular history, pertinent for CAD s/p NSTEMI 1+ years ago, HTN, HLD. Followed by Dr. Nehemiah Massed. Last appointment 08/17/2018.  o Recommended d/c clopidogrel as at least one year out from event. Continued ASA 81 daily.  . Wife reports that he checks BP - reports home readings 120-140s/60-70s . Reports that their granddaughter comes to fill pill box weekly for patient, but sometimes she can't come by  . Through med review, noted that patient has been taking atorvastatin 40 mg daily instead of 80  Pharmacist Clinical Goal(s):  [name] Over the next 90 days, patient will work with PharmD and primary care provider to address needs related to optimized cardiovascular risk reduction  Interventions: . Comprehensive medication review performed. Updated medication list in Epic as appropriate.  . Confirmed with [facility] that they have an active prescription for atorvastatin 80 mg daily. Patient's wife will remind patient to take 2 40 mg tablets until complete. Requested that 80 mg tabs be transferred to [facility] for future packaging.  . Recommend updated lipid panel 6-8 weeks after atorvastatin increase  Patient Self Care Activities:  . Calls pharmacy for medication refills . Calls provider office for new concerns or questions  Please see past updates related to this goal by clicking on the "Past Updates" button in the selected goal      . "We want to keep his blood sugars better controlled" (pt-stated)       Current Barriers:  . Diabetes: uncontrolled though improved per SMBG results, most recent A1c 11.5%; DUE for follow up o Approved for [name] assistance for Basaglar through 03/17/2019. Temporarily approved for Jardiance through [employer], pending Extra Help LIS  . Current antihyperglycemic regimen: Basaglar 56 units QAM, glipizide 5 mg QAM,  metformin 1000 mg BID, Jardiance 25 mg  . Denies hypoglycemic symptoms, including dizziness, lightheadedness, shaking, sweating . Current meal pattern:  o Breakfast: bacon, egg, and cheese sandwich; sometimes doesn't eat o Lunch: skips o Supper: Chicken, hamburger, broccoli, brussel sprouts o Snacks: notes he doesn't eat snacks often o Drinks: diet drinks, tea w/ splenda;  . Current blood glucose readings: Fasting 154, range 150s-180s . Cardiovascular risk reduction: o Current hypertensive regimen: enalapril 20 mg BID, doxazosin 2 mg daily, metoprolol tartrate 25 mg BID; BP at goal <130/80 on last check o Current hyperlipidemia regimen: atorvastatin 80 mg daily, though patient has been taking atorvastatin 40 mg daily; last LDL not at goal;   Pharmacist Clinical Goal(s):  [name] Over the next 90 days, patient with work with PharmD and primary care provider to address optimized medication management  Interventions: . Congratulated patient on improvement in blood glucose readings with medication adherence.  . Discussed medication adherence with wife. With her permission, collaborated [facility] adherence [facility] and delivery. Faxed insurance card and [facility] to [facility]. Patient's wife verbalized understanding.  [name]. Pending A1c result, could consider dose increase of Basaglar moving forward to target fasting blood sugar vs addition of GLP1  Patient Self Care Activities:  . Patient will check blood glucose BID , document, and provide at future appointments . Patient will take medications as prescribed . Patient will report any questions or concerns to provider   Please see past updates related to this goal by clicking on the "Past Updates" button in the selected goal          Plan:  - Will collaborate with [facility], patient, and patient's wife for medication adherence packaging.  - Will outreach in  2-4 weeks to ensure smooth transition to new pharmacy  Catie Feliz Beamravis, PharmD Clinical Pharmacist [facility] [phone]

## 2018-11-24 NOTE — Patient Instructions (Signed)
Visit Information  Goals Addressed            This Visit's Progress     Patient Stated   . "He needs to track his health better" (pt-stated)       Current Barriers:  . Cardiovascular history, pertinent for CAD s/p NSTEMI 1+ years ago, HTN, HLD. Followed by Dr. Nehemiah Massed. Last appointment 08/17/2018.  o Recommended d/c clopidogrel as at least one year out from event. Continued ASA 81 daily.  . Wife reports that he checks BP - reports home readings 120-140s/60-70s . Reports that their granddaughter comes to fill pill box weekly for patient, but sometimes she can't come by  . Through med review, noted that patient has been taking atorvastatin 40 mg daily instead of 80  Pharmacist Clinical Goal(s):  [name] Over the next 90 days, patient will work with PharmD and primary care provider to address needs related to optimized cardiovascular risk reduction  Interventions: . Comprehensive medication review performed. Updated medication list in Epic as appropriate.  . Confirmed with [place] that they have an active prescription for atorvastatin 80 mg daily. Patient's wife will remind patient to take 2 40 mg tablets until complete. Requested that 80 mg tabs be transferred to [facility] for future packaging.  . Recommend updated lipid panel 6-8 weeks after atorvastatin increase  Patient Self Care Activities:  . Calls pharmacy for medication refills . Calls provider office for new concerns or questions  Please see past updates related to this goal by clicking on the "Past Updates" button in the selected goal      . "We want to keep his blood sugars better controlled" (pt-stated)       Current Barriers:  . Diabetes: uncontrolled though improved per SMBG results, most recent A1c 11.5%; DUE for follow up o Approved for [name] assistance for Basaglar through 03/17/2019. Temporarily approved for Jardiance through [employer], pending Extra Help LIS  . Current antihyperglycemic  regimen: Basaglar 56 units QAM, glipizide 5 mg QAM, metformin 1000 mg BID, Jardiance 25 mg  . Denies hypoglycemic symptoms, including dizziness, lightheadedness, shaking, sweating . Current meal pattern:  o Breakfast: bacon, egg, and cheese sandwich; sometimes doesn't eat o Lunch: skips o Supper: Chicken, hamburger, broccoli, brussel sprouts o Snacks: notes he doesn't eat snacks often o Drinks: diet drinks, tea w/ splenda;  . Current blood glucose readings: Fasting 154, range 150s-180s . Cardiovascular risk reduction: o Current hypertensive regimen: enalapril 20 mg BID, doxazosin 2 mg daily, metoprolol tartrate 25 mg BID; BP at goal <130/80 on last check o Current hyperlipidemia regimen: atorvastatin 80 mg daily, though patient has been taking atorvastatin 40 mg daily; last LDL not at goal;   Pharmacist Clinical Goal(s):  [name] Over the next 90 days, patient with work with PharmD and primary care provider to address optimized medication management  Interventions: . Congratulated patient on improvement in blood glucose readings with medication adherence.  . Discussed medication adherence with wife. With her permission, collaborated with [place] to transfer prescriptions from [place], as they can do adherence packaging and delivery. Faxed insurance card and updated medication list to [facility]. Patient's wife verbalized understanding.  [name] Pending A1c result, could consider dose increase of Basaglar moving forward to target fasting blood sugar vs addition of GLP1  Patient Self Care Activities:  . Patient will check blood glucose BID , document, and provide at future appointments . Patient will take medications as prescribed . Patient will report any questions  or concerns to provider   Please see past updates related to this goal by clicking on the "Past Updates" button in the selected goal          The patient verbalized understanding of instructions provided today and declined a  print copy of patient instruction materials.   Plan:  - Will collaborate with [facility], patient, and patient's wife for medication adherence packaging.  - Will outreach in 2-4 weeks to ensure smooth transition to new pharmacy  Catie Feliz Beam, PharmD Clinical Pharmacist [facility] [phone]

## 2018-11-25 ENCOUNTER — Ambulatory Visit: Payer: Self-pay | Admitting: Pharmacist

## 2018-11-25 NOTE — Chronic Care Management (AMB) (Signed)
  Chronic Care Management   Note  11/25/2018 Name: Benjamin Chan MRN: [id number] DOB: Dec 04, 1945  [name] is a 73 y.o. year old male who is a primary care patient of [name], [name], MD. The CCM team was consulted for assistance with chronic disease management and care coordination needs.    Received call from [place] at [facility]. She transferred prescriptions from [place] to [facility] for pill packing as requested by patient, but some scripts were not sent by [facility]. She requests refills on the following be sent to [facility]:   - Ventolin 90 mcg - Albuterol Neb 2.5/3 mL - Amlodipine 10 mg  - Aspirin 81 mg - Atorvastatin 80 mg  - Doxazosin 2 mg  - Enalapril 20 mg BID - Finasteride 5 mg - Fluticasone nasal - Trelegy - Metformin XR 500 mg  Will collaborate with Dr. Jeananne Rama on these refills.  [name], PharmD Clinical Pharmacist [place] [phone]

## 2018-11-26 ENCOUNTER — Other Ambulatory Visit: Payer: Self-pay | Admitting: Pharmacy Technician

## 2018-11-26 MED ORDER — DOXAZOSIN MESYLATE 2 MG PO TABS: 2.0000 mg | ORAL_TABLET | Freq: Every day | ORAL | 1 refills | Status: DC

## 2018-11-26 MED ORDER — ENALAPRIL MALEATE 20 MG PO TABS: 20.0000 mg | ORAL_TABLET | Freq: Two times a day (BID) | ORAL | 1 refills | Status: DC

## 2018-11-26 MED ORDER — FINASTERIDE 5 MG PO TABS: 5.0000 mg | ORAL_TABLET | Freq: Every day | ORAL | 1 refills | Status: DC

## 2018-11-26 MED ORDER — ALBUTEROL SULFATE HFA 108 (90 BASE) MCG/ACT IN AERS: 2.0000 | INHALATION_SPRAY | Freq: Four times a day (QID) | RESPIRATORY_TRACT | 11 refills | Status: DC | PRN

## 2018-11-26 MED ORDER — METFORMIN HCL ER 500 MG PO TB24: 1000.0000 mg | ORAL_TABLET | Freq: Two times a day (BID) | ORAL | 1 refills | Status: DC

## 2018-11-26 MED ORDER — ALBUTEROL SULFATE (2.5 MG/3ML) 0.083% IN NEBU: 2.5000 mg | INHALATION_SOLUTION | Freq: Four times a day (QID) | RESPIRATORY_TRACT | 1 refills | Status: DC | PRN

## 2018-11-26 MED ORDER — ATORVASTATIN CALCIUM 80 MG PO TABS: 80.0000 mg | ORAL_TABLET | Freq: Every day | ORAL | 3 refills | Status: DC

## 2018-11-26 MED ORDER — ASPIRIN EC 81 MG PO TBEC: 81.0000 mg | DELAYED_RELEASE_TABLET | Freq: Every day | ORAL | 1 refills | Status: DC

## 2018-11-26 MED ORDER — TRELEGY ELLIPTA 100-62.5-25 MCG/INH IN AEPB: 1.0000 | INHALATION_SPRAY | Freq: Every day | RESPIRATORY_TRACT | 11 refills | Status: DC

## 2018-11-26 MED ORDER — AMLODIPINE BESYLATE 10 MG PO TABS: 10.0000 mg | ORAL_TABLET | Freq: Every day | ORAL | 1 refills | Status: DC

## 2018-11-26 MED ORDER — FLUTICASONE PROPIONATE 50 MCG/ACT NA SUSP: 1.0000 | Freq: Two times a day (BID) | NASAL | 11 refills | Status: DC

## 2018-11-26 NOTE — Addendum Note (Signed)
Addended by: Meryle Ready on: 11/26/2018 08:33 AM   Modules accepted: Orders

## 2018-11-26 NOTE — Progress Notes (Signed)
Rx's sent to [facility] for pill packing

## 2018-11-26 NOTE — Patient Outreach (Signed)
[facility]) Care Management  11/26/2018  Benjamin Chan [birth year] [id number]   Successful outreach call placed to patient in regards to [facility] application for Jardiance.  Spoke to patient's wife, HIPAA identifiers verified.  Patient's wife informed they have not received any correspondence from social security in regards to patient's application for Extra Help. Informed patient's wife I would followup at the end of the month. Patient's wife appreciated the check in.  Will followup with patient in 10-14 business days unless call is received from patient or his wife first.  Luiz Ochoa. Jaima Janney, [place] [facility] [phone]

## 2018-11-28 MED ORDER — ALBUTEROL SULFATE (2.5 MG/3ML) 0.083% IN NEBU: 2.5000 mg | INHALATION_SOLUTION | Freq: Four times a day (QID) | RESPIRATORY_TRACT | 1 refills | Status: DC | PRN

## 2018-11-28 MED ORDER — TRELEGY ELLIPTA 100-62.5-25 MCG/INH IN AEPB: 1.0000 | INHALATION_SPRAY | Freq: Every day | RESPIRATORY_TRACT | 11 refills | Status: DC

## 2018-11-28 MED ORDER — ASPIRIN EC 81 MG PO TBEC: 81.0000 mg | DELAYED_RELEASE_TABLET | Freq: Every day | ORAL | 1 refills | Status: DC

## 2018-11-28 MED ORDER — ALBUTEROL SULFATE HFA 108 (90 BASE) MCG/ACT IN AERS: 2.0000 | INHALATION_SPRAY | Freq: Four times a day (QID) | RESPIRATORY_TRACT | 11 refills | Status: DC | PRN

## 2018-11-28 MED ORDER — FLUTICASONE PROPIONATE 50 MCG/ACT NA SUSP: 1.0000 | Freq: Two times a day (BID) | NASAL | 11 refills | Status: DC

## 2018-11-28 MED ORDER — ENALAPRIL MALEATE 20 MG PO TABS: 20.0000 mg | ORAL_TABLET | Freq: Two times a day (BID) | ORAL | 1 refills | Status: DC

## 2018-11-28 MED ORDER — FINASTERIDE 5 MG PO TABS: 5.0000 mg | ORAL_TABLET | Freq: Every day | ORAL | 1 refills | Status: DC

## 2018-11-28 MED ORDER — AMLODIPINE BESYLATE 10 MG PO TABS: 10.0000 mg | ORAL_TABLET | Freq: Every day | ORAL | 1 refills | Status: DC

## 2018-11-28 MED ORDER — ATORVASTATIN CALCIUM 80 MG PO TABS: 80.0000 mg | ORAL_TABLET | Freq: Every day | ORAL | 3 refills | Status: DC

## 2018-11-28 MED ORDER — DOXAZOSIN MESYLATE 2 MG PO TABS: 2.0000 mg | ORAL_TABLET | Freq: Every day | ORAL | 1 refills | Status: DC

## 2018-11-28 MED ORDER — METFORMIN HCL ER 500 MG PO TB24: 1000.0000 mg | ORAL_TABLET | Freq: Two times a day (BID) | ORAL | 1 refills | Status: DC

## 2018-11-28 NOTE — Addendum Note (Signed)
Addended by: Golden Pop A on: 11/28/2018 11:59 AM   Modules accepted: Orders

## 2018-11-30 ENCOUNTER — Other Ambulatory Visit: Payer: Self-pay | Admitting: Family Medicine

## 2018-11-30 DIAGNOSIS — E119 Type 2 diabetes mellitus without complications: Secondary | ICD-10-CM
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
## 2018-11-30 NOTE — Telephone Encounter (Signed)
Medication Refill - Medication:  glucose blood (ACCU-CHEK AVIVA) test strip ACCU-CHEK FASTCLIX LANCETS MISC Insulin Pen Needle (ADVOCATE INSULIN PEN NEEDLES) 31G X 5 MM MISC Insulin Glargine (BASAGLAR KWIKPEN) 100 UNIT/ML SOPN nitroGLYCERIN (NITROSTAT) 0.4 MG SL tablet Multiple Vitamin (MULTIVITAMIN) capsule   Has the patient contacted their pharmacy? Yes - pharmacy calling (Agent: If no, request that the patient contact the pharmacy for the refill.) (Agent: If yes, when and what did the pharmacy advise?)  Preferred Pharmacy (with phone number or street name):  [place], [place] - [employer] [phone] (Phone) [phone] (Fax)     Agent: Please be advised that RX refills may take up to 3 business days. We ask that you follow-up with your pharmacy.

## 2018-12-01 MED ORDER — ACCU-CHEK FASTCLIX LANCETS MISC: 4 refills | Status: DC

## 2018-12-01 MED ORDER — NITROGLYCERIN 0.4 MG SL SUBL: 0.4000 mg | SUBLINGUAL_TABLET | SUBLINGUAL | 12 refills | Status: DC | PRN

## 2018-12-01 MED ORDER — MULTIVITAMINS PO CAPS: 1.0000 | ORAL_CAPSULE | Freq: Every day | ORAL | 4 refills | Status: DC

## 2018-12-01 MED ORDER — BASAGLAR KWIKPEN 100 UNIT/ML ~~LOC~~ SOPN: 56.0000 [IU] | PEN_INJECTOR | Freq: Every day | SUBCUTANEOUS | 1 refills | Status: DC

## 2018-12-01 MED ORDER — ADVOCATE INSULIN PEN NEEDLES 31G X 5 MM MISC: 1.0000 [IU] | Freq: Every morning | 12 refills | Status: DC

## 2018-12-01 MED ORDER — ACCU-CHEK AVIVA VI STRP: ORAL_STRIP | 12 refills | Status: DC

## 2018-12-03 ENCOUNTER — Ambulatory Visit: Payer: Self-pay | Admitting: Pharmacist

## 2018-12-03 DIAGNOSIS — IMO0002 Reserved for concepts with insufficient information to code with codable children: Secondary | ICD-10-CM

## 2018-12-03 DIAGNOSIS — E1129 Type 2 diabetes mellitus with other diabetic kidney complication: Secondary | ICD-10-CM

## 2018-12-03 DIAGNOSIS — I10 Essential (primary) hypertension: Secondary | ICD-10-CM

## 2018-12-03 NOTE — Patient Instructions (Signed)
Visit Information  Goals Addressed            This Visit's Progress     Patient Stated   . "He needs to track his health better" (pt-stated)       Current Barriers:  . Cardiovascular history, pertinent for CAD s/p NSTEMI 1+ years ago, HTN, HLD. Followed by Dr. Nehemiah Massed. Last appointment 08/17/2018.  o Recommended d/c clopidogrel as at least one year out from event. Continued ASA 81 daily.  . Wife reports that he checks BP - reports home readings 120-140s/60-70s . Reports that their granddaughter comes to fill pill box weekly for patient, but sometimes she can't come by  o Discussed w/ patient and his wife; they wanted to transfer medications to a pharmacy that would offer pill packaging o Collaborated with [name] at [facility] to set this service up  Pharmacist Clinical Goal(s):  [name] Over the next 90 days, patient will work with PharmD and primary care provider to address needs related to optimized cardiovascular risk reduction  Interventions: . Comprehensive medication review performed.  . Contacted patient and his wife. She notes that they took patient's supply of medications to [facility] yesterday so that Joseph Art could figure out how to sync everything up. She notes no needs at this time. Appears multiple refills were requested and provided by Dr. Jeananne Rama yesterday  Patient Self Care Activities:  . Calls pharmacy for medication refills . Calls provider office for new concerns or questions  Please see past updates related to this goal by clicking on the "Past Updates" button in the selected goal         The patient verbalized understanding of instructions provided today and declined a print copy of patient instruction materials.   Plan:  - Will outreach patient in the next 5-6 weeks for continued medication management support  [name], PharmD Clinical Pharmacist [place] [phone]

## 2018-12-03 NOTE — Chronic Care Management (AMB) (Signed)
Chronic Care Management   Follow Up Note   12/03/2018 Name: Benjamin Chan MRN: [id number] DOB: 05-01-1945  Referred by: Guadalupe Maple, MD Reason for referral : Chronic Care Management (Medication Management)   Benjamin Chan is a 73 y.o. year old male who is a primary care patient of [name], [name], MD. The CCM team was consulted for assistance with chronic disease management and care coordination needs.    Review of patient status, including review of consultants reports, relevant laboratory and other test results, and collaboration with appropriate care team members and the patient's provider was performed as part of comprehensive patient evaluation and provision of chronic care management services.    SDOH (Social Determinants of Health) screening performed today: None. See Care Plan for related entries.   Advanced Directives Status: N See Care Plan and [employer] application for related entries.  Outpatient Encounter Medications as of 12/03/2018  Medication Sig  . Accu-Chek FastClix Lancets MISC Use one lancet to check blood sugars between 1-4 times daily  . acetaminophen (TYLENOL) 325 MG tablet Take 650 mg by mouth every 6 (six) hours as needed.  [name] albuterol (PROVENTIL) (2.5 MG/3ML) 0.083% nebulizer solution Take 3 mLs (2.5 mg total) by nebulization every 6 (six) hours as needed for wheezing or shortness of breath.  [name] albuterol (VENTOLIN HFA) 108 (90 Base) MCG/ACT inhaler Inhale 2 puffs into the lungs every 6 (six) hours as needed for wheezing or shortness of breath.  [name] amLODipine (NORVASC) 10 MG tablet Take 1 tablet (10 mg total) by mouth daily.  [name] aspirin EC 81 MG tablet Take 1 tablet (81 mg total) by mouth daily.  [name] atorvastatin (LIPITOR) 80 MG tablet Take 1 tablet (80 mg total) by mouth daily.  [name] doxazosin (CARDURA) 2 MG tablet Take 1 tablet (2 mg total) by mouth daily.  . empagliflozin (JARDIANCE) 25 MG TABS tablet Take 25 mg by mouth daily.  . enalapril (VASOTEC) 20  MG tablet Take 1 tablet (20 mg total) by mouth 2 (two) times daily.  . finasteride (PROSCAR) 5 MG tablet Take 1 tablet (5 mg total) by mouth daily.  . fluticasone (FLONASE) 50 MCG/ACT nasal spray Place 1 spray into both nostrils 2 (two) times daily. Pt takes twice a day  . Fluticasone-Umeclidin-Vilant (TRELEGY ELLIPTA) 100-62.5-25 MCG/INH AEPB Inhale 1 puff into the lungs daily.  [name] glipiZIDE (GLUCOTROL) 5 MG tablet Take 1 tablet (5 mg total) by mouth daily before breakfast.  . glucose blood (ACCU-CHEK AVIVA) test strip Use as instructed  . guaiFENesin (MUCINEX) 600 MG 12 hr tablet Take 1 tablet (600 mg total) by mouth 2 (two) times daily as needed.  . Insulin Glargine (BASAGLAR KWIKPEN) 100 UNIT/ML SOPN Inject 0.56 mLs (56 Units total) into the skin daily.  . Insulin Pen Needle (ADVOCATE INSULIN PEN NEEDLES) 31G X 5 MM MISC Inject 1 Units into the skin every morning.  . metFORMIN (GLUCOPHAGE-XR) 500 MG 24 hr tablet Take 2 tablets (1,000 mg total) by mouth 2 (two) times daily.  . metoprolol tartrate (LOPRESSOR) 25 MG tablet Take 1 tablet (25 mg total) by mouth 2 (two) times daily.  . Multiple Vitamin (MULTIVITAMIN) capsule Take 1 capsule by mouth daily.  . nitroGLYCERIN (NITROSTAT) 0.4 MG SL tablet Place 1 tablet (0.4 mg total) under the tongue every 5 (five) minutes as needed for chest pain.  . traMADol (ULTRAM) 50 MG tablet Take 1 tablet (50 mg total) by mouth every 8 (eight) hours as needed. (Patient not taking:  Reported on 07/27/2018)   No facility-administered encounter medications on file as of 12/03/2018.      Goals Addressed            This Visit's Progress     Patient Stated   . "He needs to track his health better" (pt-stated)       Current Barriers:  . Cardiovascular history, pertinent for CAD s/p NSTEMI 1+ years ago, HTN, HLD. Followed by Dr. Gwen Pounds. Last appointment 08/17/2018.  o Recommended d/c clopidogrel as at least one year out from event. Continued ASA 81 daily.  . Wife  reports that he checks BP - reports home readings 120-140s/60-70s . Reports that their granddaughter comes to fill pill box weekly for patient, but sometimes she can't come by  o Discussed w/ patient and his wife; they wanted to transfer medications to a pharmacy that would offer pill packaging o Collaborated with [name] at [facility] to set this service up  Pharmacist Clinical Goal(s):  [name] Over the next 90 days, patient will work with PharmD and primary care provider to address needs related to optimized cardiovascular risk reduction  Interventions: . Comprehensive medication review performed.  . Contacted patient and his wife. She notes that they took patient's supply of medications to [facility] yesterday so that [name] could figure out how to sync everything up. She notes no needs at this time. Appears multiple refills were requested and provided by Dr. Dossie Arbour yesterday  Patient Self Care Activities:  . Calls pharmacy for medication refills . Calls provider office for new concerns or questions  Please see past updates related to this goal by clicking on the "Past Updates" button in the selected goal         Plan:  - Will outreach patient in the next 5-6 weeks for continued medication management support  Catie Feliz Beam, PharmD Clinical Pharmacist [facility] [phone]

## 2018-12-13 ENCOUNTER — Other Ambulatory Visit: Payer: Self-pay | Admitting: Pharmacy Technician

## 2018-12-13 NOTE — Patient Outreach (Signed)
[facility]) Care Management  12/13/2018  [name] Jun 05, 1945 [id number]   Unsuccessful outreach call placed to patient in regards to [facility] application for Jardiance.  Unfortunately patient did not answer the phone, HIPAA compliant voicemail left.  Was calling patient to inquire if he had received a determination letter form social security regarding the Extra Help application that was completed on 10/19/2018.  Will followup with patient in 10-15 business days if call is not returned.  Benjamin Chan, [place] Management [phone]

## 2018-12-28 ENCOUNTER — Telehealth: Payer: Self-pay

## 2018-12-28 NOTE — Telephone Encounter (Signed)
PA for Basaglar initiated and submitted via Cover My Meds.  Key: [id number]

## 2018-12-31 ENCOUNTER — Other Ambulatory Visit: Payer: Self-pay | Admitting: Pharmacy Technician

## 2018-12-31 NOTE — Patient Outreach (Signed)
[place] [facility]) Care Management  12/31/2018  Benjamin Chan 11-19-1945 [id number]  Successful outreach call placed to patient in regards to [facility] application for Jardiance.  Spoke to patient's wife, Benjamin Chan. HIPAA identifiers verified.  Patient's wife informed she has received no correspondence from social security in regards to the Extra Help application that embedded [place] helped patient apply for back on October 19, 2018. This letter is needed in order for patient's enrollment with the program to be extended. Informed patient that I would send a note to embedded [facility] [name] as patient may have to reapply. Patient's wife verbalized understanding.  Will route note to embedded [place] and await advice as to how to proceed with case.  Jozelyn Kuwahara P. Evan Mackie, [place] Management [phone]

## 2019-01-04 NOTE — Telephone Encounter (Signed)
PA denied.

## 2019-01-05 ENCOUNTER — Ambulatory Visit: Payer: Self-pay | Admitting: Pharmacist

## 2019-01-05 DIAGNOSIS — E1129 Type 2 diabetes mellitus with other diabetic kidney complication: Secondary | ICD-10-CM

## 2019-01-05 DIAGNOSIS — IMO0002 Reserved for concepts with insufficient information to code with codable children: Secondary | ICD-10-CM

## 2019-01-05 NOTE — Chronic Care Management (AMB) (Signed)
Chronic Care Management   Follow Up Note   01/05/2019 Name: Benjamin Chan MRN: [id number] DOB: 12-22-1945  Referred by: Steele Sizer, MD Reason for referral : Chronic Care Management (Medication Management)   Benjamin Chan is a 73 y.o. year old male who is a primary care patient of [name], [name], MD. The CCM team was consulted for assistance with chronic disease management and care coordination needs.    Care coordination completed today.   Review of patient status, including review of consultants reports, relevant laboratory and other test results, and collaboration with appropriate care team members and the patient's provider was performed as part of comprehensive patient evaluation and provision of chronic care management services.    SDOH (Social Determinants of Health) screening performed today: Financial Strain . See Care Plan for related entries.   Advanced Directives Status: N See Care Plan and Vynca application for related entries.  Outpatient Encounter Medications as of 01/05/2019  Medication Sig  . Accu-Chek FastClix Lancets MISC Use one lancet to check blood sugars between 1-4 times daily  . acetaminophen (TYLENOL) 325 MG tablet Take 650 mg by mouth every 6 (six) hours as needed.  [name] albuterol (PROVENTIL) (2.5 MG/3ML) 0.083% nebulizer solution Take 3 mLs (2.5 mg total) by nebulization every 6 (six) hours as needed for wheezing or shortness of breath.  [name] albuterol (VENTOLIN HFA) 108 (90 Base) MCG/ACT inhaler Inhale 2 puffs into the lungs every 6 (six) hours as needed for wheezing or shortness of breath.  [name] amLODipine (NORVASC) 10 MG tablet Take 1 tablet (10 mg total) by mouth daily.  [name] aspirin EC 81 MG tablet Take 1 tablet (81 mg total) by mouth daily.  [name] atorvastatin (LIPITOR) 80 MG tablet Take 1 tablet (80 mg total) by mouth daily.  [name] doxazosin (CARDURA) 2 MG tablet Take 1 tablet (2 mg total) by mouth daily.  . empagliflozin (JARDIANCE) 25 MG TABS tablet  Take 25 mg by mouth daily.  . enalapril (VASOTEC) 20 MG tablet Take 1 tablet (20 mg total) by mouth 2 (two) times daily.  . finasteride (PROSCAR) 5 MG tablet Take 1 tablet (5 mg total) by mouth daily.  . fluticasone (FLONASE) 50 MCG/ACT nasal spray Place 1 spray into both nostrils 2 (two) times daily. Pt takes twice a day  . Fluticasone-Umeclidin-Vilant (TRELEGY ELLIPTA) 100-62.5-25 MCG/INH AEPB Inhale 1 puff into the lungs daily.  [name] glipiZIDE (GLUCOTROL) 5 MG tablet Take 1 tablet (5 mg total) by mouth daily before breakfast.  . glucose blood (ACCU-CHEK AVIVA) test strip Use as instructed  . guaiFENesin (MUCINEX) 600 MG 12 hr tablet Take 1 tablet (600 mg total) by mouth 2 (two) times daily as needed.  . Insulin Glargine (BASAGLAR KWIKPEN) 100 UNIT/ML SOPN Inject 0.56 mLs (56 Units total) into the skin daily.  . Insulin Pen Needle (ADVOCATE INSULIN PEN NEEDLES) 31G X 5 MM MISC Inject 1 Units into the skin every morning.  . metFORMIN (GLUCOPHAGE-XR) 500 MG 24 hr tablet Take 2 tablets (1,000 mg total) by mouth 2 (two) times daily.  . metoprolol tartrate (LOPRESSOR) 25 MG tablet Take 1 tablet (25 mg total) by mouth 2 (two) times daily.  . Multiple Vitamin (MULTIVITAMIN) capsule Take 1 capsule by mouth daily.  . nitroGLYCERIN (NITROSTAT) 0.4 MG SL tablet Place 1 tablet (0.4 mg total) under the tongue every 5 (five) minutes as needed for chest pain.  . traMADol (ULTRAM) 50 MG tablet Take 1 tablet (50 mg total) by mouth every  8 (eight) hours as needed. (Patient not taking: Reported on 07/27/2018)   No facility-administered encounter medications on file as of 01/05/2019.      Goals Addressed            This Visit's Progress     Patient Stated   . "We want to keep his blood sugars better controlled" (pt-stated)       Current Barriers:  . Diabetes: uncontrolled though improved per SMBG results, most recent A1c 11.5%; DUE for follow up o Approved for [name] assistance for Basaglar through  03/17/2019. Temporarily approved for Jardiance through [employer], pending Extra Help LIS  o Patient has not received LIS decision yet . Current antihyperglycemic regimen: Basaglar 56 units QAM, glipizide 5 mg QAM, metformin 1000 mg BID, Jardiance 25 mg  . Denies hypoglycemic symptoms, including dizziness, lightheadedness, shaking, sweating . Current blood glucose readings: Fasting 130-140s, one episode of 89 . Cardiovascular risk reduction: o Current hypertensive regimen: enalapril 20 mg BID, doxazosin 2 mg daily, metoprolol tartrate 25 mg BID; BP at goal <130/80 on last check o Current hyperlipidemia regimen: atorvastatin 80 mg daily, though patient has been taking atorvastatin 40 mg daily; last LDL not at goal;   Pharmacist Clinical Goal(s):  [name] Over the next 90 days, patient with work with PharmD and primary care provider to address optimized medication management  Interventions: . Attempted to contact [employer] to determine if they had documentation of Medicare Extra Help for the patient; was unable to connect with a representative . [place]; requested they run the Trelegy prescription to test the copay; copay was $90, so clearly patient has not been approved for Extra Help.  [name] patient's wife. Encouraged her to call the local Social Security office to see if they have the application decision on file. If they are unable to help her, we will submit the application for Medicare Extra Help again. . Patient is overdue for an appointment and labwork. Encouraged patient's wife to contact clinic to schedule follow up with a provider.   Patient Self Care Activities:  . Patient will check blood glucose BID , document, and provide at future appointments . Patient will take medications as prescribed . Patient will report any questions or concerns to provider   Please see past updates related to this goal by clicking on the "Past Updates" button in the selected  goal           Plan:  - Will outreach patient's wife in 1-2 weeks to follow up about contacting [place], PharmD Clinical Pharmacist [place] [phone]

## 2019-01-05 NOTE — Patient Instructions (Signed)
Visit Information  Goals Addressed            This Visit's Progress     Patient Stated   . "We want to keep his blood sugars better controlled" (pt-stated)       Current Barriers:  . Diabetes: uncontrolled though improved per SMBG results, most recent A1c 11.5%; DUE for follow up o Approved for [name] assistance for Basaglar through 03/17/2019. Temporarily approved for Jardiance through [employer], pending Extra Help LIS  o Patient has not received LIS decision yet . Current antihyperglycemic regimen: Basaglar 56 units QAM, glipizide 5 mg QAM, metformin 1000 mg BID, Jardiance 25 mg  . Denies hypoglycemic symptoms, including dizziness, lightheadedness, shaking, sweating . Current blood glucose readings: Fasting 130-140s, one episode of 89 . Cardiovascular risk reduction: o Current hypertensive regimen: enalapril 20 mg BID, doxazosin 2 mg daily, metoprolol tartrate 25 mg BID; BP at goal <130/80 on last check o Current hyperlipidemia regimen: atorvastatin 80 mg daily, though patient has been taking atorvastatin 40 mg daily; last LDL not at goal;   Pharmacist Clinical Goal(s):  [name] Over the next 90 days, patient with work with PharmD and primary care provider to address optimized medication management  Interventions: . Attempted to contact [employer] to determine if they had documentation of Medicare Extra Help for the patient; was unable to connect with a representative . [place]; requested they run the Trelegy prescription to test the copay; copay was $90, so clearly patient has not been approved for Extra Help.  [name] patient's wife. Encouraged her to call the local Social Security office to see if they have the application decision on file. If they are unable to help her, we will submit the application for Medicare Extra Help again. . Patient is overdue for an appointment and labwork. Encouraged patient's wife to contact clinic to schedule follow up with a  provider.   Patient Self Care Activities:  . Patient will check blood glucose BID , document, and provide at future appointments . Patient will take medications as prescribed . Patient will report any questions or concerns to provider   Please see past updates related to this goal by clicking on the "Past Updates" button in the selected goal          The patient verbalized understanding of instructions provided today and declined a print copy of patient instruction materials.   Plan:  - Will outreach patient's wife in 1-2 weeks to follow up about contacting [place], PharmD Clinical Pharmacist [place] [phone]

## 2019-01-12 ENCOUNTER — Telehealth: Payer: Self-pay

## 2019-01-21 ENCOUNTER — Ambulatory Visit: Payer: Self-pay | Admitting: Pharmacist

## 2019-01-21 NOTE — Chronic Care Management (AMB) (Signed)
  Chronic Care Management   Note  01/21/2019 Name: [name] MRN: [id number] DOB: 06-14-1945  [name] is a 73 y.o. year old male who is a primary care patient of [name], [name], MD. The CCM team was consulted for assistance with chronic disease management and care coordination needs.    Contacted patient to follow up on if he had received any notice from the [facility] regarding Medicare Extra Help. He is also overdue for lab work and follow up.   He was out running errands. I encouraged him to call the office when he got home to schedule an appointment with Merrie Roof for follow up. He verbalized understanding.   Follow up plan: - Will outreach patient in the next 3-4 weeks for continued medication management support  [name], PharmD Clinical Pharmacist [place] [phone]

## 2019-01-26 ENCOUNTER — Telehealth: Payer: Self-pay | Admitting: Pharmacist

## 2019-01-26 NOTE — Telephone Encounter (Signed)
Patient needs follow up appointment and labs.   Can we outreach to schedule?

## 2019-01-26 NOTE — Telephone Encounter (Signed)
Called pt scheduled for 11/20 8:45

## 2019-01-28 ENCOUNTER — Ambulatory Visit: Payer: Self-pay | Admitting: Pharmacist

## 2019-01-28 DIAGNOSIS — IMO0002 Reserved for concepts with insufficient information to code with codable children: Secondary | ICD-10-CM

## 2019-01-28 DIAGNOSIS — E1129 Type 2 diabetes mellitus with other diabetic kidney complication: Secondary | ICD-10-CM

## 2019-01-28 NOTE — Chronic Care Management (AMB) (Signed)
Chronic Care Management   Follow Up Note   01/28/2019 Name: Benjamin Chan MRN: [id number] DOB: Jul 02, 1945  Referred by: Guadalupe Maple, MD Reason for referral : Chronic Care Management (Medication Management)   Benjamin Chan is a 73 y.o. year old male who is a primary care patient of [name], [name], MD. The CCM team was consulted for assistance with chronic disease management and care coordination needs.    Care coordination completed today.   Review of patient status, including review of consultants reports, relevant laboratory and other test results, and collaboration with appropriate care team members and the patient's provider was performed as part of comprehensive patient evaluation and provision of chronic care management services.    SDOH (Social Determinants of Health) screening performed today: Financial Strain . See Care Plan for related entries.   Outpatient Encounter Medications as of 01/28/2019  Medication Sig  . Accu-Chek FastClix Lancets MISC Use one lancet to check blood sugars between 1-4 times daily  . acetaminophen (TYLENOL) 325 MG tablet Take 650 mg by mouth every 6 (six) hours as needed.  [name] albuterol (PROVENTIL) (2.5 MG/3ML) 0.083% nebulizer solution Take 3 mLs (2.5 mg total) by nebulization every 6 (six) hours as needed for wheezing or shortness of breath.  [name] albuterol (VENTOLIN HFA) 108 (90 Base) MCG/ACT inhaler Inhale 2 puffs into the lungs every 6 (six) hours as needed for wheezing or shortness of breath.  [name] amLODipine (NORVASC) 10 MG tablet Take 1 tablet (10 mg total) by mouth daily.  [name] aspirin EC 81 MG tablet Take 1 tablet (81 mg total) by mouth daily.  [name] atorvastatin (LIPITOR) 80 MG tablet Take 1 tablet (80 mg total) by mouth daily.  [name] doxazosin (CARDURA) 2 MG tablet Take 1 tablet (2 mg total) by mouth daily.  . empagliflozin (JARDIANCE) 25 MG TABS tablet Take 25 mg by mouth daily.  . enalapril (VASOTEC) 20 MG tablet Take 1 tablet (20 mg total)  by mouth 2 (two) times daily.  . finasteride (PROSCAR) 5 MG tablet Take 1 tablet (5 mg total) by mouth daily.  . fluticasone (FLONASE) 50 MCG/ACT nasal spray Place 1 spray into both nostrils 2 (two) times daily. Pt takes twice a day  . Fluticasone-Umeclidin-Vilant (TRELEGY ELLIPTA) 100-62.5-25 MCG/INH AEPB Inhale 1 puff into the lungs daily.  [name] glipiZIDE (GLUCOTROL) 5 MG tablet Take 1 tablet (5 mg total) by mouth daily before breakfast.  . glucose blood (ACCU-CHEK AVIVA) test strip Use as instructed  . guaiFENesin (MUCINEX) 600 MG 12 hr tablet Take 1 tablet (600 mg total) by mouth 2 (two) times daily as needed.  . Insulin Glargine (BASAGLAR KWIKPEN) 100 UNIT/ML SOPN Inject 0.56 mLs (56 Units total) into the skin daily.  . Insulin Pen Needle (ADVOCATE INSULIN PEN NEEDLES) 31G X 5 MM MISC Inject 1 Units into the skin every morning.  . metFORMIN (GLUCOPHAGE-XR) 500 MG 24 hr tablet Take 2 tablets (1,000 mg total) by mouth 2 (two) times daily.  . metoprolol tartrate (LOPRESSOR) 25 MG tablet Take 1 tablet (25 mg total) by mouth 2 (two) times daily.  . Multiple Vitamin (MULTIVITAMIN) capsule Take 1 capsule by mouth daily.  . nitroGLYCERIN (NITROSTAT) 0.4 MG SL tablet Place 1 tablet (0.4 mg total) under the tongue every 5 (five) minutes as needed for chest pain.  . traMADol (ULTRAM) 50 MG tablet Take 1 tablet (50 mg total) by mouth every 8 (eight) hours as needed. (Patient not taking: Reported on 07/27/2018)   No  facility-administered encounter medications on file as of 01/28/2019.      Goals Addressed            This Visit's Progress     Patient Stated   . "We want to keep his blood sugars better controlled" (pt-stated)       Current Barriers:  . Diabetes: uncontrolled though improved per SMBG results, most recent A1c 11.5%; scheduled for follow up with Roosvelt Maser, PA next week o Approved for [name] assistance for Basaglar through 03/17/2019. Temporarily approved for Jardiance through [employer], pending Extra Help LIS - wife notes that they received a letter from [facility] that "they are working on it" . Current antihyperglycemic regimen: Basaglar 56 units QAM, glipizide 5 mg QAM, metformin 1000 mg BID, Jardiance 25 mg  . Denies hypoglycemic symptoms, including dizziness, lightheadedness, shaking, sweating . Current blood glucose readings: Fasting 130-140s, one episode of 89 . Cardiovascular risk reduction: o Current hypertensive regimen: enalapril 20 mg BID, doxazosin 2 mg daily, metoprolol tartrate 25 mg BID; BP at goal <130/80 on last check o Current hyperlipidemia regimen: atorvastatin 80 mg daily, though patient has been taking atorvastatin 40 mg daily; last LDL not at goal;   Pharmacist Clinical Goal(s):  [name] Over the next 90 days, patient with work with PharmD and primary care provider to address optimized medication management  Interventions: . Discussed this SSA letter with wife- asked them to bring a copy by the clinic next week when patient has his appointment. They verbalized understanding.  . Wife asked if she could come to the appointment as well. I think this is appropriate, as she manages his medications and conditions. Will collaborate w/ clinic staff.   Patient Self Care Activities:  . Patient will check blood glucose BID , document, and provide at future appointments . Patient will take medications as prescribed . Patient will report any questions or concerns to provider   Please see past updates related to this goal by clicking on the "Past Updates" button in the selected goal          Plan:  - Will meet with patient next week face to face  Catie Feliz Beam, PharmD Clinical Pharmacist [facility] [phone]

## 2019-01-28 NOTE — Patient Instructions (Signed)
Visit Information  Goals Addressed            This Visit's Progress     Patient Stated   . "We want to keep his blood sugars better controlled" (pt-stated)       Current Barriers:  . Diabetes: uncontrolled though improved per SMBG results, most recent A1c 11.5%; scheduled for follow up with Merrie Roof, PA next week o Approved for [name] assistance for Basaglar through 03/17/2019. Temporarily approved for Jardiance through [employer], pending Extra Help LIS - wife notes that they received a letter from [facility] that "they are working on it" . Current antihyperglycemic regimen: Basaglar 56 units QAM, glipizide 5 mg QAM, metformin 1000 mg BID, Jardiance 25 mg  . Denies hypoglycemic symptoms, including dizziness, lightheadedness, shaking, sweating . Current blood glucose readings: Fasting 130-140s, one episode of 89 . Cardiovascular risk reduction: o Current hypertensive regimen: enalapril 20 mg BID, doxazosin 2 mg daily, metoprolol tartrate 25 mg BID; BP at goal <130/80 on last check o Current hyperlipidemia regimen: atorvastatin 80 mg daily, though patient has been taking atorvastatin 40 mg daily; last LDL not at goal;   Pharmacist Clinical Goal(s):  [name] Over the next 90 days, patient with work with PharmD and primary care provider to address optimized medication management  Interventions: . Discussed this SSA letter with wife- asked them to bring a copy by the clinic next week when patient has his appointment. They verbalized understanding.  . Wife asked if she could come to the appointment as well. I think this is appropriate, as she manages his medications and conditions. Will collaborate w/ clinic staff.   Patient Self Care Activities:  . Patient will check blood glucose BID , document, and provide at future appointments . Patient will take medications as prescribed . Patient will report any questions or concerns to provider   Please see past updates related to this goal by  clicking on the "Past Updates" button in the selected goal          The patient verbalized understanding of instructions provided today and declined a print copy of patient instruction materials.   Plan:  - Will meet with patient next week face to face  [name], PharmD Clinical Pharmacist [place] [phone]

## 2019-02-03 ENCOUNTER — Other Ambulatory Visit: Payer: Self-pay

## 2019-02-04 ENCOUNTER — Telehealth: Payer: Self-pay

## 2019-02-04 ENCOUNTER — Ambulatory Visit (INDEPENDENT_AMBULATORY_CARE_PROVIDER_SITE_OTHER): Payer: Medicare HMO | Admitting: Family Medicine

## 2019-02-04 ENCOUNTER — Encounter: Payer: Self-pay | Admitting: Family Medicine

## 2019-02-04 VITALS — BP 161/81 | HR 61

## 2019-02-04 DIAGNOSIS — IMO0002 Reserved for concepts with insufficient information to code with codable children: Secondary | ICD-10-CM

## 2019-02-04 DIAGNOSIS — N400 Enlarged prostate without lower urinary tract symptoms: Secondary | ICD-10-CM

## 2019-02-04 DIAGNOSIS — E1165 Type 2 diabetes mellitus with hyperglycemia: Secondary | ICD-10-CM

## 2019-02-04 DIAGNOSIS — E1129 Type 2 diabetes mellitus with other diabetic kidney complication: Secondary | ICD-10-CM | POA: Diagnosis not present

## 2019-02-04 DIAGNOSIS — I25810 Atherosclerosis of coronary artery bypass graft(s) without angina pectoris: Secondary | ICD-10-CM | POA: Diagnosis not present

## 2019-02-04 DIAGNOSIS — E785 Hyperlipidemia, unspecified: Secondary | ICD-10-CM

## 2019-02-04 DIAGNOSIS — J449 Chronic obstructive pulmonary disease, unspecified: Secondary | ICD-10-CM

## 2019-02-04 DIAGNOSIS — I129 Hypertensive chronic kidney disease with stage 1 through stage 4 chronic kidney disease, or unspecified chronic kidney disease: Secondary | ICD-10-CM | POA: Diagnosis not present

## 2019-02-04 DIAGNOSIS — I252 Old myocardial infarction: Secondary | ICD-10-CM | POA: Diagnosis not present

## 2019-02-04 MED ORDER — HYDRALAZINE HCL 10 MG PO TABS: 10.0000 mg | ORAL_TABLET | Freq: Two times a day (BID) | ORAL | 0 refills | Status: DC

## 2019-02-04 NOTE — Progress Notes (Signed)
BP (!) 161/81   Pulse 61    Subjective:    Patient ID: Geoffery Lyons, male    DOB: 1945-12-21, 73 y.o.   MRN: [id number]  HPI: [id number] TADDEI is a 73 y.o. male  Chief Complaint  Patient presents with  . Diabetes  . Hyperlipidemia  . Hypertension    . This visit was completed via telephone due to the restrictions of the COVID-19 pandemic. All issues as above were discussed and addressed. Physical exam was done as above through visual confirmation on telephone. If it was felt that the patient should be evaluated in the office, they were directed there. The patient verbally consented to this visit. . Location of the patient: home . Location of the provider: work . Those involved with this call:  . Provider: Roosvelt Maser, PA-C . CMA: Elton Sin, CMA . Front Desk/Registration: Harriet Pho  . Time spent on call: 25 minutes on the phone discussing health concerns. 5 minutes total spent in review of patient's record and preparation of their chart. I verified patient identity using two factors (patient name and date of birth). Patient consents verbally to being seen via telemedicine visit today.   Presenting today for 6 month f/u.   COPD - getting over a cold, using his inhalers faithfully for the most part and feels they are helping.   DM - BS 214 this morning, but lately it's been about 115 on average. Taking 56 units of basaglar daily in addition to metformin, jardiance, and glipizide. States he doesn't eat much anymore, about 2 meals per day. Staying active. No low blood sugar spells or side effects to his medicines.   HTN - Not following home BPs. Trying to take medicines faithfully. Denies CP, SOB, HAs, dizziness.   HLD - Taking high dose lipitor without side effects. Trying to eat well and be active as much as possible. Denies claudication, myalgias.   BPH - on proscar, no new issues or worsening sxs.   Relevant past medical, surgical, family and social history  reviewed and updated as indicated. Interim medical history since our last visit reviewed. Allergies and medications reviewed and updated.  Review of Systems  Per HPI unless specifically indicated above     Objective:    BP (!) 161/81   Pulse 61   Wt Readings from Last 3 Encounters:  07/19/18 219 lb 14.4 oz (99.7 kg)  04/07/18 224 lb 9.6 oz (101.9 kg)  01/27/18 217 lb 6.4 oz (98.6 kg)    Physical Exam  Unable to perform PE due to patient lack of access to video technology for visit.   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 F/u if worsening and will escalate treatment for URI/exacerbation  Endocrine   DM (diabetes mellitus), type 2, uncontrolled, with renal complications (HCC)    Recheck A1C, adjust as needed. Continue working on improvements with diet and exercise      Relevant Orders   HgB A1c     Genitourinary   BPH (benign prostatic hyperplasia) - Primary    Stable and under good control, continue current regimen      Benign hypertensive renal disease    Elevated today and at numerous appts this year. Will add hydralazine and monitor closely for tolerance. Discussed logging readings and bringing to next appt.       Relevant Orders   Comprehensive metabolic panel     Other   History of myocardial infarction    Followed by Cardiology,  continue present regimen and good lifestyle modifications      Hyperlipidemia    Recheck lipids, continue high dose lipitor and working on lifestyle habits      Relevant Medications   hydrALAZINE (APRESOLINE) 10 MG tablet       Follow up plan: Return in about 2 weeks (around 02/18/2019) for in person BP f/u.

## 2019-02-14 ENCOUNTER — Other Ambulatory Visit: Payer: Self-pay

## 2019-02-14 ENCOUNTER — Other Ambulatory Visit: Payer: Self-pay | Admitting: Pharmacy Technician

## 2019-02-14 MED ORDER — METOPROLOL TARTRATE 25 MG PO TABS: 25.0000 mg | ORAL_TABLET | Freq: Two times a day (BID) | ORAL | 1 refills | Status: DC

## 2019-02-14 NOTE — Patient Outreach (Signed)
[facility]) Care Management  02/14/2019  Benjamin Chan 1945-06-06 [id number]    Successful call placed to patient regarding patient assistance receipt of LIS determination letter to extend enrollment for BI patient assistance program for Jardiance., HIPAA identifiers verified.   Spoke to both patient and his wife, HPAA identifiers verified.  Patient's wife informed she has not received the determination letter. She informed the only correspondence she has received is a letter stating that they are processing the application. She informed she received that letter a few weeks ago.  Informed patient to go ahead and bring the letter to the office for embedded [place] to look over and decide if it is something that we can submit to the patient assistance company. Patient's wife informed she would bring it to the office.  Will close patient assistance case for now as the 2020 enrollment period is coming to a close. If determination letter is received will re open the case.   Follow up:  Will route note to embedded [facility] [name] for case closure with option to re open if information is received by mid December. Will remove myself from care team.  Luiz Ochoa. Johne Buckle, [place] [facility] [phone]

## 2019-02-14 NOTE — Telephone Encounter (Signed)
Patient last seen 02/04/19 

## 2019-02-17 NOTE — Assessment & Plan Note (Signed)
Recheck lipids, continue high dose lipitor and working on lifestyle habits

## 2019-02-17 NOTE — Assessment & Plan Note (Signed)
Stable and under good control, continue current regimen 

## 2019-02-17 NOTE — Assessment & Plan Note (Signed)
Followed by Cardiology, continue per their recommendations 

## 2019-02-17 NOTE — Assessment & Plan Note (Signed)
Recheck A1C, adjust as needed. Continue working on improvements with diet and exercise

## 2019-02-17 NOTE — Assessment & Plan Note (Signed)
Followed by Cardiology, continue present regimen and good lifestyle modifications

## 2019-02-17 NOTE — Assessment & Plan Note (Signed)
Mild exacerbation due to URI, continue inhaler regimen, mucinex. F/u if worsening and will escalate treatment for URI/exacerbation

## 2019-02-17 NOTE — Assessment & Plan Note (Signed)
Elevated today and at numerous appts this year. Will add hydralazine and monitor closely for tolerance. Discussed logging readings and bringing to next appt.

## 2019-02-21 DIAGNOSIS — I25810 Atherosclerosis of coronary artery bypass graft(s) without angina pectoris: Secondary | ICD-10-CM | POA: Diagnosis not present

## 2019-02-21 DIAGNOSIS — I214 Non-ST elevation (NSTEMI) myocardial infarction: Secondary | ICD-10-CM | POA: Diagnosis not present

## 2019-02-21 DIAGNOSIS — R0602 Shortness of breath: Secondary | ICD-10-CM | POA: Diagnosis not present

## 2019-02-21 DIAGNOSIS — I10 Essential (primary) hypertension: Secondary | ICD-10-CM | POA: Diagnosis not present

## 2019-02-21 DIAGNOSIS — I872 Venous insufficiency (chronic) (peripheral): Secondary | ICD-10-CM | POA: Diagnosis not present

## 2019-02-21 DIAGNOSIS — E782 Mixed hyperlipidemia: Secondary | ICD-10-CM | POA: Diagnosis not present

## 2019-02-23 ENCOUNTER — Telehealth: Payer: Self-pay

## 2019-02-25 ENCOUNTER — Telehealth: Payer: Self-pay

## 2019-03-08 ENCOUNTER — Other Ambulatory Visit: Payer: Self-pay | Admitting: Family Medicine

## 2019-03-08 MED ORDER — HYDRALAZINE HCL 10 MG PO TABS: 10.0000 mg | ORAL_TABLET | Freq: Two times a day (BID) | ORAL | 0 refills | Status: DC
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
## 2019-03-08 NOTE — Telephone Encounter (Signed)
[name] with [employer] calling for the t to request refill hydrALAZINE (APRESOLINE) 10 MG tablet  [place], [place] Phone:  [phone]  Fax:  [phone]

## 2019-03-14 ENCOUNTER — Ambulatory Visit: Payer: Medicare HMO | Admitting: Family Medicine

## 2019-03-15 ENCOUNTER — Ambulatory Visit (INDEPENDENT_AMBULATORY_CARE_PROVIDER_SITE_OTHER): Payer: Medicare HMO | Admitting: Pharmacist

## 2019-03-15 DIAGNOSIS — IMO0002 Reserved for concepts with insufficient information to code with codable children: Secondary | ICD-10-CM

## 2019-03-15 DIAGNOSIS — E1165 Type 2 diabetes mellitus with hyperglycemia: Secondary | ICD-10-CM | POA: Diagnosis not present

## 2019-03-15 DIAGNOSIS — E1129 Type 2 diabetes mellitus with other diabetic kidney complication: Secondary | ICD-10-CM | POA: Diagnosis not present

## 2019-03-15 NOTE — Patient Instructions (Signed)
Visit Information  Goals Addressed            This Visit's Progress     Patient Stated   . "He needs to track his health better" (pt-stated)       Current Barriers:  . Cardiovascular history, pertinent for CAD s/p NSTEMI 1+ years ago, HTN, HLD. Followed by Dr. Gwen Pounds. Last appointment 08/17/2018 o Recommended d/c clopidogrel as at least one year out from event. Continued ASA 81 daily.  . Wife reports that he checks BP - reports home readings 120-140s/60-70s . Reports that their granddaughter comes to fill pill box weekly for patient, but sometimes she can't come by  o Discussed w/ patient and his wife; they wanted to transfer medications to a pharmacy that would offer pill packaging o Collaborated with [name] at [facility] to set this service up  Pharmacist Clinical Goal(s):  [name] Over the next 90 days, patient will work with PharmD and primary care provider to address needs related to optimized cardiovascular risk reduction  Interventions: . Comprehensive medication review performed.  . Contacted patient and his wife. She notes that they took patient's supply of medications to [facility] yesterday so that [name] could figure out how to sync everything up. She notes no needs at this time. Appears multiple refills were requested and provided by Dr. Dossie Arbour yesterday  Patient Self Care Activities:  . Calls pharmacy for medication refills . Calls provider office for new concerns or questions  Please see past updates related to this goal by clicking on the "Past Updates" button in the selected goal      . "We want to keep his blood sugars better controlled" (pt-stated)       Current Barriers:  . Diabetes: uncontrolled though improved per SMBG results, most recent A1c 11.5%; has not come by for f/u labs yet.  o Approved for [name] assistance for Basaglar through 03/17/2019. Temporarily approved for Jardiance through [employer]; he had applied for Medicare Extra Help in 2019  w/ other [facility] pharmacist, but was approved for partial, but isn't sure where his letter is. We had re-applied in hopes they would send another letter, but we have not heard anything. Wife reports copay for London Pepper is "about $90", which I assume is for a 90 day supply . Current antihyperglycemic regimen: Basaglar 56 units QAM, glipizide 5 mg QAM, metformin 1000 mg BID, Jardiance 25 mg  . Denies hypoglycemic symptoms . Cardiovascular risk reduction: o Current hypertensive regimen: enalapril 20 mg BID, doxazosin 2 mg daily, metoprolol tartrate 25 mg BID; hydralazine 10 mg BID; BP closer to goal <130/80 at last cardiology visit o Current hyperlipidemia regimen: atorvastatin 80 mg daily, LDL pending  Pharmacist Clinical Goal(s):  [name] Over the next 90 days, patient with work with PharmD and primary care provider to address optimized medication management  Interventions: . Collaborated w/ clinic staff to schedule patient for lab visit on 03/21/2019 for updated A1c, CMP, and lipid panel. Per cardiology recommendation, goal LDL is <70 . Due to reapply for 2021 assistance for [name] for [employer]. Will also assess if patient will qualify for BI assistance for Jardiance. Clearly, patient has not been approved for full Extra Help, and still has partial. Patient will bring proof of copay for Jardiance to clinic when he comes for lab work, and we will review w/ income and determine if he would meet criteria for Jardiance (if copay x 12 months is >3% of yearly income). Patient will sign his portions of  applications when he comes for lab work. Will collaborate w/ Merrie Roof, PA for her portions of applications.  . Also providing patient a [facility] Planner for documentation of home BG/BP to take to future provider appointments.   Patient Self Care Activities:  . Patient will check blood glucose BID , document, and provide at future appointments . Patient will take medications as prescribed . Patient will report any  questions or concerns to provider   Please see past updates related to this goal by clicking on the "Past Updates" button in the selected goal          The patient verbalized understanding of instructions provided today and declined a print copy of patient instruction materials.   Plan: - Will collaborate w/ patient, provider, and CPhT as above - Scheduled follow up outreach 04/13/2019 at 9:30 am   [name], PharmD, [place] [phone]

## 2019-03-15 NOTE — Chronic Care Management (AMB) (Signed)
Chronic Care Management   Follow Up Note   03/15/2019 Name: Benjamin Chan MRN: [id number] DOB: March 21, 1945  Referred by: Benjamin Sizer, MD Reason for referral : Chronic Care Management (Medication Management)   Benjamin Chan is a 73 y.o. year old male who is a primary care patient of [name], [name], MD. The CCM team was consulted for assistance with chronic disease management and care coordination needs.    Contacted patient and his wife for medication management review.   Review of patient status, including review of consultants reports, relevant laboratory and other test results, and collaboration with appropriate care team members and the patient's provider was performed as part of comprehensive patient evaluation and provision of chronic care management services.    SDOH (Social Determinants of Health) screening performed today: Financial Strain . See Care Plan for related entries.   Outpatient Encounter Medications as of 03/15/2019  Medication Sig   Accu-Chek FastClix Lancets MISC Use one lancet to check blood sugars between 1-4 times daily   acetaminophen (TYLENOL) 325 MG tablet Take 650 mg by mouth every 6 (six) hours as needed.   albuterol (PROVENTIL) (2.5 MG/3ML) 0.083% nebulizer solution Take 3 mLs (2.5 mg total) by nebulization every 6 (six) hours as needed for wheezing or shortness of breath. (Patient not taking: Reported on 02/04/2019)   albuterol (VENTOLIN HFA) 108 (90 Base) MCG/ACT inhaler Inhale 2 puffs into the lungs every 6 (six) hours as needed for wheezing or shortness of breath. (Patient not taking: Reported on 02/04/2019)   amLODipine (NORVASC) 10 MG tablet Take 1 tablet (10 mg total) by mouth daily.   aspirin EC 81 MG tablet Take 1 tablet (81 mg total) by mouth daily.   atorvastatin (LIPITOR) 80 MG tablet Take 1 tablet (80 mg total) by mouth daily.   doxazosin (CARDURA) 2 MG tablet Take 1 tablet (2 mg total) by mouth daily.   empagliflozin  (JARDIANCE) 25 MG TABS tablet Take 25 mg by mouth daily.   enalapril (VASOTEC) 20 MG tablet Take 1 tablet (20 mg total) by mouth 2 (two) times daily.   finasteride (PROSCAR) 5 MG tablet Take 1 tablet (5 mg total) by mouth daily.   fluticasone (FLONASE) 50 MCG/ACT nasal spray Place 1 spray into both nostrils 2 (two) times daily. Pt takes twice a day   Fluticasone-Umeclidin-Vilant (TRELEGY ELLIPTA) 100-62.5-25 MCG/INH AEPB Inhale 1 puff into the lungs daily.   glipiZIDE (GLUCOTROL) 5 MG tablet Take 1 tablet (5 mg total) by mouth daily before breakfast.   glucose blood (ACCU-CHEK AVIVA) test strip Use as instructed   guaiFENesin (MUCINEX) 600 MG 12 hr tablet Take 1 tablet (600 mg total) by mouth 2 (two) times daily as needed. (Patient not taking: Reported on 02/04/2019)   hydrALAZINE (APRESOLINE) 10 MG tablet Take 1 tablet (10 mg total) by mouth 2 (two) times daily.   Insulin Glargine (BASAGLAR KWIKPEN) 100 UNIT/ML SOPN Inject 0.56 mLs (56 Units total) into the skin daily.   Insulin Pen Needle (ADVOCATE INSULIN PEN NEEDLES) 31G X 5 MM MISC Inject 1 Units into the skin every morning.   metFORMIN (GLUCOPHAGE-XR) 500 MG 24 hr tablet Take 2 tablets (1,000 mg total) by mouth 2 (two) times daily.   metoprolol tartrate (LOPRESSOR) 25 MG tablet Take 1 tablet (25 mg total) by mouth 2 (two) times daily.   Multiple Vitamin (MULTIVITAMIN) capsule Take 1 capsule by mouth daily. (Patient not taking: Reported on 02/04/2019)   nitroGLYCERIN (NITROSTAT) 0.4 MG SL tablet  Place 1 tablet (0.4 mg total) under the tongue every 5 (five) minutes as needed for chest pain.   traMADol (ULTRAM) 50 MG tablet Take 1 tablet (50 mg total) by mouth every 8 (eight) hours as needed. (Patient not taking: Reported on 07/27/2018)   No facility-administered encounter medications on file as of 03/15/2019.     Goals Addressed             This Visit's Progress     Patient Stated    "He needs to track his health better" (pt-stated)        Current Barriers:  Cardiovascular history, pertinent for CAD s/p NSTEMI 1+ years ago, HTN, HLD. Followed by Dr. Gwen Chan. Last appointment 08/17/2018 Recommended d/c clopidogrel as at least one year out from event. Continued ASA 81 daily.  Wife reports that he checks BP - reports home readings 120-140s/60-70s Reports that their granddaughter comes to fill pill box weekly for patient, but sometimes she can't come by  Discussed w/ patient and his wife; they wanted to transfer medications to a pharmacy that would offer pill packaging Collaborated with Benjamin Chan at [facility] to set this service up  Pharmacist Clinical Goal(s):  Over the next 90 days, patient will work with PharmD and primary care provider to address needs related to optimized cardiovascular risk reduction  Interventions: Comprehensive medication review performed.  Contacted patient and his wife. She notes that they took patient's supply of medications to [facility] yesterday so that Benjamin Chan could figure out how to sync everything up. She notes no needs at this time. Appears multiple refills were requested and provided by Dr. Dossie Chan yesterday  Patient Self Care Activities:  Calls pharmacy for medication refills Calls provider office for new concerns or questions  Please see past updates related to this goal by clicking on the "Past Updates" button in the selected goal       "We want to keep his blood sugars better controlled" (pt-stated)       Current Barriers:  Diabetes: uncontrolled though improved per SMBG results, most recent A1c 11.5%; has not come by for f/u labs yet.  Approved for Benjamin [facility] assistance for Benjamin Chan through 03/17/2019. Temporarily approved for Jardiance through Benjamin Chan; he had applied for Medicare Extra Help in 2019 w/ other Benjamin Chan pharmacist, but was approved for partial, but isn't sure where his letter is. We had re-applied in hopes they would send another letter, but we have not heard  anything. Wife reports copay for [place] Pepper is "about $90", which I assume is for a 90 day supply Current antihyperglycemic regimen: Basaglar 56 units QAM, glipizide 5 mg QAM, metformin 1000 mg BID, Jardiance 25 mg  Denies hypoglycemic symptoms Cardiovascular risk reduction: Current hypertensive regimen: enalapril 20 mg BID, doxazosin 2 mg daily, metoprolol tartrate 25 mg BID; hydralazine 10 mg BID; BP closer to goal <130/80 at last cardiology visit Current hyperlipidemia regimen: atorvastatin 80 mg daily, LDL pending  Pharmacist Clinical Goal(s):  Over the next 90 days, patient with work with PharmD and primary care provider to address optimized medication management  Interventions: Collaborated w/ clinic staff to schedule patient for lab visit on 03/21/2019 for updated A1c, CMP, and lipid panel. Per cardiology recommendation, goal LDL is <70 Due to reapply for 2021 assistance for Benjamin Chan for Basaglar. Will also assess if patient will qualify for BI assistance for Jardiance. Clearly, patient has not been approved for full Extra Help, and still has partial. Patient will bring proof of copay for Jardiance to  clinic when he comes for lab work, and we will review w/ income and determine if he would meet criteria for Jardiance (if copay x 12 months is >3% of yearly income). Patient will sign his portions of applications when he comes for lab work. Will collaborate w/ Merrie Roof, PA for her portions of applications.  Also providing patient a [facility] Planner for documentation of home BG/BP to take to future provider appointments.   Patient Self Care Activities:  Patient will check blood glucose BID , document, and provide at future appointments Patient will take medications as prescribed Patient will report any questions or concerns to provider   Please see past updates related to this goal by clicking on the "Past Updates" button in the selected goal            Plan: - Will collaborate w/ patient,  provider, and CPhT as above - Scheduled follow up outreach 04/13/2019 at 9:30 am      [name], PharmD, [place] [phone]

## 2019-03-21 ENCOUNTER — Other Ambulatory Visit: Payer: Medicare HMO

## 2019-03-21 DIAGNOSIS — E782 Mixed hyperlipidemia: Secondary | ICD-10-CM | POA: Diagnosis not present

## 2019-03-23 ENCOUNTER — Encounter: Payer: Self-pay | Admitting: Family Medicine

## 2019-03-23 ENCOUNTER — Ambulatory Visit (INDEPENDENT_AMBULATORY_CARE_PROVIDER_SITE_OTHER): Payer: Medicare HMO | Admitting: Family Medicine

## 2019-03-23 ENCOUNTER — Other Ambulatory Visit: Payer: Self-pay

## 2019-03-23 VITALS — BP 128/60 | HR 63 | Temp 98.8°F | Ht 66.0 in | Wt 218.0 lb

## 2019-03-23 DIAGNOSIS — J449 Chronic obstructive pulmonary disease, unspecified: Secondary | ICD-10-CM | POA: Diagnosis not present

## 2019-03-23 DIAGNOSIS — E1159 Type 2 diabetes mellitus with other circulatory complications: Secondary | ICD-10-CM

## 2019-03-23 DIAGNOSIS — I10 Essential (primary) hypertension: Secondary | ICD-10-CM

## 2019-03-23 DIAGNOSIS — I25810 Atherosclerosis of coronary artery bypass graft(s) without angina pectoris: Secondary | ICD-10-CM | POA: Diagnosis not present

## 2019-03-23 DIAGNOSIS — E1129 Type 2 diabetes mellitus with other diabetic kidney complication: Secondary | ICD-10-CM | POA: Diagnosis not present

## 2019-03-23 DIAGNOSIS — I129 Hypertensive chronic kidney disease with stage 1 through stage 4 chronic kidney disease, or unspecified chronic kidney disease: Secondary | ICD-10-CM | POA: Diagnosis not present

## 2019-03-23 DIAGNOSIS — E1165 Type 2 diabetes mellitus with hyperglycemia: Secondary | ICD-10-CM

## 2019-03-23 DIAGNOSIS — Z1211 Encounter for screening for malignant neoplasm of colon: Secondary | ICD-10-CM | POA: Diagnosis not present

## 2019-03-23 DIAGNOSIS — I152 Hypertension secondary to endocrine disorders: Secondary | ICD-10-CM

## 2019-03-23 DIAGNOSIS — IMO0002 Reserved for concepts with insufficient information to code with codable children: Secondary | ICD-10-CM

## 2019-03-23 MED ORDER — HYDRALAZINE HCL 10 MG PO TABS: 10.0000 mg | ORAL_TABLET | Freq: Two times a day (BID) | ORAL | 1 refills | Status: DC

## 2019-03-23 MED ORDER — GLIPIZIDE 5 MG PO TABS: 5.0000 mg | ORAL_TABLET | Freq: Every day | ORAL | 1 refills | Status: DC

## 2019-03-23 MED ORDER — ALBUTEROL SULFATE (2.5 MG/3ML) 0.083% IN NEBU: 2.5000 mg | INHALATION_SOLUTION | Freq: Four times a day (QID) | RESPIRATORY_TRACT | 1 refills | Status: DC | PRN

## 2019-03-23 MED ORDER — BASAGLAR KWIKPEN 100 UNIT/ML ~~LOC~~ SOPN: 56.0000 [IU] | PEN_INJECTOR | Freq: Every day | SUBCUTANEOUS | 1 refills | Status: DC

## 2019-03-23 NOTE — Progress Notes (Signed)
BP 128/60   Pulse 63   Temp 98.8 F (37.1 C) (Oral)   Ht 5\' 6"  (1.676 m)   Wt 218 lb (98.9 kg)   SpO2 94%   BMI 35.19 kg/m    Subjective:    Patient ID: Benjamin Chan, male    DOB: Jun 03, 1945, 74 y.o.   MRN: [id number]  HPI: Benjamin Chan is a 74 y.o. male  Chief Complaint  Patient presents with  . Hypertension   Patient presenting today to have labwork done from recent 6 month f/u and to recheck BPs after addition of hydralazine to current regimen. Tolerating medication well and states home readings have been 120s-130s/70s typically. Denies CP, SOB, HAs, dizziness.   BSs running 98 -110 range fasting per patient. Taking medications faithfully. No low blood sugar spells. Trying to improve diet but still having trouble controlling "sweet tooth" per patient.   COPD - breathing doing well on trelegy, still occasionally having some chest tightness but overall doing much better since starting it. Denies wheezing, SOB.   Relevant past medical, surgical, family and social history reviewed and updated as indicated. Interim medical history since our last visit reviewed. Allergies and medications reviewed and updated.  Review of Systems  Per HPI unless specifically indicated above     Objective:    BP 128/60   Pulse 63   Temp 98.8 F (37.1 C) (Oral)   Ht 5\' 6"  (1.676 m)   Wt 218 lb (98.9 kg)   SpO2 94%   BMI 35.19 kg/m   Wt Readings from Last 3 Encounters:  03/23/19 218 lb (98.9 kg)  07/19/18 219 lb 14.4 oz (99.7 kg)  04/07/18 224 lb 9.6 oz (101.9 kg)    Physical Exam Vitals and nursing note reviewed.  Constitutional:      Appearance: Normal appearance.  HENT:     Head: Atraumatic.  Eyes:     Extraocular Movements: Extraocular movements intact.     Conjunctiva/sclera: Conjunctivae normal.  Cardiovascular:     Rate and Rhythm: Normal rate and regular rhythm.  Pulmonary:     Effort: Pulmonary effort is normal.     Breath sounds: Normal breath sounds.   Musculoskeletal:        General: Normal range of motion.     Cervical back: Normal range of motion and neck supple.  Skin:    General: Skin is warm and dry.  Neurological:     General: No focal deficit present.     Mental Status: He is oriented to person, place, and time.  Psychiatric:        Mood and Affect: Mood normal.        Thought Content: Thought content normal.        Judgment: Judgment normal.     Results for orders placed or performed in visit on 03/23/19  HgB A1c  Result Value Ref Range   Hgb A1c MFr Bld 8.0 (H) 4.8 - 5.6 %   Est. average glucose Bld gHb Est-mCnc 183 mg/dL  Comprehensive metabolic panel  Result Value Ref Range   Glucose 72 65 - 99 mg/dL   BUN 18 8 - 27 mg/dL   Creatinine, Ser 1.41 (H) 0.76 - 1.27 mg/dL   GFR calc non Af Amer 48 (L) >59 mL/min/1.73   GFR calc Af Amer 56 (L) >59 mL/min/1.73   BUN/Creatinine Ratio 13 10 - 24   Sodium 141 134 - 144 mmol/L   Potassium 4.2 3.5 - 5.2 mmol/L  Chloride 106 96 - 106 mmol/L   CO2 23 20 - 29 mmol/L   Calcium 9.1 8.6 - 10.2 mg/dL   Total Protein 6.8 6.0 - 8.5 g/dL   Albumin 4.0 3.7 - 4.7 g/dL   Globulin, Total 2.8 1.5 - 4.5 g/dL   Albumin/Globulin Ratio 1.4 1.2 - 2.2   Bilirubin Total 0.4 0.0 - 1.2 mg/dL   Alkaline Phosphatase 116 39 - 117 IU/L   AST 22 0 - 40 IU/L   ALT 23 0 - 44 IU/L  Lipid Panel w/o Chol/HDL Ratio out  Result Value Ref Range   Cholesterol, Total 179 100 - 199 mg/dL   Triglycerides 86 0 - 149 mg/dL   HDL 44 >20 mg/dL   VLDL Cholesterol Cal 16 5 - 40 mg/dL   LDL Chol Calc (NIH) 947 (H) 0 - 99 mg/dL      Assessment & Plan:   Problem List Items Addressed This Visit      Cardiovascular and Mediastinum   Hypertension associated with diabetes (HCC)    BPs improved with addition of hydralazine. 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 Continue current regimen      Relevant Medications   albuterol (PROVENTIL) (2.5 MG/3ML) 0.083% nebulizer solution     Endocrine   DM (diabetes mellitus), type 2, uncontrolled, with renal complications (HCC)    Recheck A1C, adjust as needed. Continue working on lifestyle habit modifications      Relevant Medications   glipiZIDE (GLUCOTROL) 5 MG tablet   Insulin Glargine (BASAGLAR KWIKPEN) 100 UNIT/ML SOPN     Genitourinary   Benign hypertensive renal disease    Other Visit Diagnoses    Colon cancer screening    -  Primary   Relevant Orders   Ambulatory referral to Gastroenterology       Follow up plan: Return in about 6 months (around 09/20/2019) for 6 month f/u.

## 2019-03-24 ENCOUNTER — Telehealth: Payer: Self-pay

## 2019-03-24 ENCOUNTER — Ambulatory Visit: Payer: Self-pay | Admitting: Pharmacist

## 2019-03-24 ENCOUNTER — Other Ambulatory Visit: Payer: Self-pay

## 2019-03-24 DIAGNOSIS — E785 Hyperlipidemia, unspecified: Secondary | ICD-10-CM

## 2019-03-24 DIAGNOSIS — E1129 Type 2 diabetes mellitus with other diabetic kidney complication: Secondary | ICD-10-CM

## 2019-03-24 DIAGNOSIS — IMO0002 Reserved for concepts with insufficient information to code with codable children: Secondary | ICD-10-CM

## 2019-03-24 DIAGNOSIS — Z1211 Encounter for screening for malignant neoplasm of colon: Secondary | ICD-10-CM

## 2019-03-24 DIAGNOSIS — Z86010 Personal history of colonic polyps: Secondary | ICD-10-CM

## 2019-03-24 LAB — HEMOGLOBIN A1C
Est. average glucose Bld gHb Est-mCnc: 183 mg/dL
Hgb A1c MFr Bld: 8 % — ABNORMAL HIGH (ref 4.8–5.6)

## 2019-03-24 LAB — LIPID PANEL W/O CHOL/HDL RATIO
Cholesterol, Total: 179 mg/dL (ref 100–199)
HDL: 44 mg/dL (ref >39)
LDL Chol Calc (NIH): 119 mg/dL — ABNORMAL HIGH (ref 0–99)
Triglycerides: 86 mg/dL (ref 0–149)
VLDL Cholesterol Cal: 16 mg/dL (ref 5–40)

## 2019-03-24 LAB — COMPREHENSIVE METABOLIC PANEL
ALT: 23 IU/L (ref 0–44)
AST: 22 IU/L (ref 0–40)
Albumin/Globulin Ratio: 1.4 (ref 1.2–2.2)
Albumin: 4 g/dL (ref 3.7–4.7)
Alkaline Phosphatase: 116 IU/L (ref 39–117)
BUN/Creatinine Ratio: 13 (ref 10–24)
BUN: 18 mg/dL (ref 8–27)
Bilirubin Total: 0.4 mg/dL (ref 0.0–1.2)
CO2: 23 mmol/L (ref 20–29)
Calcium: 9.1 mg/dL (ref 8.6–10.2)
Chloride: 106 mmol/L (ref 96–106)
Creatinine, Ser: 1.41 mg/dL — ABNORMAL HIGH (ref 0.76–1.27)
GFR calc Af Amer: 56 mL/min/{1.73_m2} — ABNORMAL LOW (ref >59)
GFR calc non Af Amer: 48 mL/min/{1.73_m2} — ABNORMAL LOW (ref >59)
Globulin, Total: 2.8 g/dL (ref 1.5–4.5)
Glucose: 72 mg/dL (ref 65–99)
Potassium: 4.2 mmol/L (ref 3.5–5.2)
Sodium: 141 mmol/L (ref 134–144)
Total Protein: 6.8 g/dL (ref 6.0–8.5)

## 2019-03-24 MED ORDER — NA SULFATE-K SULFATE-MG SULF 17.5-3.13-1.6 GM/177ML PO SOLN: 354.0000 mL | Freq: Once | ORAL | 0 refills | Status: AC

## 2019-03-24 NOTE — Chronic Care Management (AMB) (Signed)
Chronic Care Management   Follow Up Note   03/24/2019 Name: Benjamin Chan MRN: [id number] DOB: Aug 17, 1945  Referred by: Steele Sizer, MD Reason for referral : Chronic Care Management (Medication Management)   Benjamin Chan is a 74 y.o. year old male who is a primary care patient of [name], [name], MD. The CCM team was consulted for assistance with chronic disease management and care coordination needs.    Care coordination completed today.   Review of patient status, including review of consultants reports, relevant laboratory and other test results, and collaboration with appropriate care team members and the patient's provider was performed as part of comprehensive patient evaluation and provision of chronic care management services.    SDOH (Social Determinants of Health) screening performed today: Financial Strain . See Care Plan for related entries.   Outpatient Encounter Medications as of 03/24/2019  Medication Sig  . Accu-Chek FastClix Lancets MISC Use one lancet to check blood sugars between 1-4 times daily  . acetaminophen (TYLENOL) 325 MG tablet Take 650 mg by mouth every 6 (six) hours as needed.  [name] albuterol (PROVENTIL) (2.5 MG/3ML) 0.083% nebulizer solution Take 3 mLs (2.5 mg total) by nebulization every 6 (six) hours as needed for wheezing or shortness of breath.  [name] albuterol (VENTOLIN HFA) 108 (90 Base) MCG/ACT inhaler Inhale 2 puffs into the lungs every 6 (six) hours as needed for wheezing or shortness of breath.  [name] amLODipine (NORVASC) 10 MG tablet Take 1 tablet (10 mg total) by mouth daily.  [name] aspirin EC 81 MG tablet Take 1 tablet (81 mg total) by mouth daily.  [name] atorvastatin (LIPITOR) 80 MG tablet Take 1 tablet (80 mg total) by mouth daily.  [name] doxazosin (CARDURA) 2 MG tablet Take 1 tablet (2 mg total) by mouth daily.  . empagliflozin (JARDIANCE) 25 MG TABS tablet Take 25 mg by mouth daily.  . enalapril (VASOTEC) 20 MG tablet Take 1 tablet (20 mg total) by  mouth 2 (two) times daily.  [name] ezetimibe (ZETIA) 10 MG tablet Take by mouth.  . finasteride (PROSCAR) 5 MG tablet Take 1 tablet (5 mg total) by mouth daily.  . fluticasone (FLONASE) 50 MCG/ACT nasal spray Place 1 spray into both nostrils 2 (two) times daily. Pt takes twice a day  . Fluticasone-Umeclidin-Vilant (TRELEGY ELLIPTA) 100-62.5-25 MCG/INH AEPB Inhale 1 puff into the lungs daily.  [name] glipiZIDE (GLUCOTROL) 5 MG tablet Take 1 tablet (5 mg total) by mouth daily before breakfast.  . glucose blood (ACCU-CHEK AVIVA) test strip Use as instructed  . guaiFENesin (MUCINEX) 600 MG 12 hr tablet Take 1 tablet (600 mg total) by mouth 2 (two) times daily as needed.  . hydrALAZINE (APRESOLINE) 10 MG tablet Take 1 tablet (10 mg total) by mouth 2 (two) times daily.  . Insulin Glargine (BASAGLAR KWIKPEN) 100 UNIT/ML SOPN Inject 0.56 mLs (56 Units total) into the skin daily.  . Insulin Pen Needle (ADVOCATE INSULIN PEN NEEDLES) 31G X 5 MM MISC Inject 1 Units into the skin every morning.  . metFORMIN (GLUCOPHAGE-XR) 500 MG 24 hr tablet Take 2 tablets (1,000 mg total) by mouth 2 (two) times daily.  . metoprolol tartrate (LOPRESSOR) 25 MG tablet Take 1 tablet (25 mg total) by mouth 2 (two) times daily.  . Multiple Vitamin (MULTIVITAMIN) capsule Take 1 capsule by mouth daily.  . Na Sulfate-K Sulfate-Mg Sulf 17.5-3.13-1.6 GM/177ML SOLN Take 354 mLs by mouth once for 1 dose.  . nitroGLYCERIN (NITROSTAT) 0.4 MG SL tablet Place  1 tablet (0.4 mg total) under the tongue every 5 (five) minutes as needed for chest pain.  . traMADol (ULTRAM) 50 MG tablet Take 1 tablet (50 mg total) by mouth every 8 (eight) hours as needed. (Patient not taking: Reported on 03/23/2019)   No facility-administered encounter medications on file as of 03/24/2019.     Goals Addressed            This Visit's Progress     Patient Stated   . "We want to keep his blood sugars better controlled" (pt-stated)       Current Barriers:  . Diabetes:  uncontrolled though improved, A1c yesterday 8.0% o Due to reapply for [employer] assistance through [employer]. Have all portions of the application besides proof of income. May have proof of income on file from last year's application.  o Needs proof of copay to show [place] that he does not have Full Extra Help.  . Current antihyperglycemic regimen: Basaglar 56 units QAM, glipizide 5 mg QAM, metformin 1000 mg BID, Jardiance 25 mg  . Denies hypoglycemic symptoms . Cardiovascular risk reduction: o Current hypertensive regimen: enalapril 20 mg BID, doxazosin 2 mg daily, metoprolol tartrate 25 mg BID; hydralazine 10 mg BID; BP closer to goal <130/80 at last cardiology visit o Current hyperlipidemia regimen: atorvastatin 80 mg daily, LDL >100; cardiology started ezetimibe earlier this week but he has not started yet.   Pharmacist Clinical Goal(s):  [name] Over the next 90 days, patient with work with PharmD and primary care provider to address optimized medication management  Interventions: . [place], explained need for proof of copay for Jardiance. They are faxing this to me at the office.  . Reviewed addition of ezetimibe. They will dispense a ~2 week supply now, and will add to the next month of adherence packaging for February.  . Will collaborate w/ CPhT on whether current income information we have is sufficient for PAP reapplication.   Patient Self Care Activities:  . Patient will check blood glucose BID , document, and provide at future appointments . Patient will take medications as prescribed . Patient will report any questions or concerns to provider   Please see past updates related to this goal by clicking on the "Past Updates" button in the selected goal           Plan: - Will collaborate w/ multidisciplinary team as above  [name], PharmD, [place] [phone]

## 2019-03-24 NOTE — Telephone Encounter (Signed)
Gastroenterology Pre-Procedure Review    PATIENT REVIEW QUESTIONS: The patient responded to the following health history questions as indicated:    1. Are you having any GI issues? no 2. Do you have a personal history of Polyps? Yes 5 years ago  3. Do you have a family history of Colon Cancer or Polyps? no 4. Diabetes Mellitus? Yes Type 2 5. Joint replacements in the past 12 months?no 6. Major health problems in the past 3 months?no 7. Any artificial heart valves, MVP, or defibrillator?no    MEDICATIONS & ALLERGIES:    Patient reports the following regarding taking any anticoagulation/antiplatelet therapy:   Plavix, Coumadin, Eliquis, Xarelto, Lovenox, Pradaxa, Brilinta, or Effient? no Aspirin? Asprin 81mg    Patient confirms/reports the following medications:  Current Outpatient Medications  Medication Sig Dispense Refill  . Accu-Chek FastClix Lancets MISC Use one lancet to check blood sugars between 1-4 times daily 100 each 4  . acetaminophen (TYLENOL) 325 MG tablet Take 650 mg by mouth every 6 (six) hours as needed.    albuterol (PROVENTIL) (2.5 MG/3ML) 0.083% nebulizer solution Take 3 mLs (2.5 mg total) by nebulization every 6 (six) hours as needed for wheezing or shortness of breath. 150 mL 1  . albuterol (VENTOLIN HFA) 108 (90 Base) MCG/ACT inhaler Inhale 2 puffs into the lungs every 6 (six) hours as needed for wheezing or shortness of breath. 18 g 11  . amLODipine (NORVASC) 10 MG tablet Take 1 tablet (10 mg total) by mouth daily. 90 tablet 1  . aspirin EC 81 MG tablet Take 1 tablet (81 mg total) by mouth daily. 90 tablet 1  . atorvastatin (LIPITOR) 80 MG tablet Take 1 tablet (80 mg total) by mouth daily. 90 tablet 3  . doxazosin (CARDURA) 2 MG tablet Take 1 tablet (2 mg total) by mouth daily. 90 tablet 1  . empagliflozin (JARDIANCE) 25 MG TABS tablet Take 25 mg by mouth daily. 90 tablet 3  . enalapril (VASOTEC) 20 MG tablet Take 1 tablet (20 mg total) by mouth 2 (two) times  daily. 90 tablet 1  . ezetimibe (ZETIA) 10 MG tablet Take by mouth.    . finasteride (PROSCAR) 5 MG tablet Take 1 tablet (5 mg total) by mouth daily. 90 tablet 1  . fluticasone (FLONASE) 50 MCG/ACT nasal spray Place 1 spray into both nostrils 2 (two) times daily. Pt takes twice a day 16 g 11  . Fluticasone-Umeclidin-Vilant (TRELEGY ELLIPTA) 100-62.5-25 MCG/INH AEPB Inhale 1 puff into the lungs daily. 1 each 11  . glipiZIDE (GLUCOTROL) 5 MG tablet Take 1 tablet (5 mg total) by mouth daily before breakfast. 90 tablet 1  . glucose blood (ACCU-CHEK AVIVA) test strip Use as instructed 100 each 12  . guaiFENesin (MUCINEX) 600 MG 12 hr tablet Take 1 tablet (600 mg total) by mouth 2 (two) times daily as needed. 30 tablet 1  . hydrALAZINE (APRESOLINE) 10 MG tablet Take 1 tablet (10 mg total) by mouth 2 (two) times daily. 180 tablet 1  . Insulin Glargine (BASAGLAR KWIKPEN) 100 UNIT/ML SOPN Inject 0.56 mLs (56 Units total) into the skin daily. 3 mL 1  . Insulin Pen Needle (ADVOCATE INSULIN PEN NEEDLES) 31G X 5 MM MISC Inject 1 Units into the skin every morning. 30 each 12  . metFORMIN (GLUCOPHAGE-XR) 500 MG 24 hr tablet Take 2 tablets (1,000 mg total) by mouth 2 (two) times daily. 360 tablet 1  . metoprolol tartrate (LOPRESSOR) 25 MG tablet Take 1 tablet (25 mg total)  by mouth 2 (two) times daily. 180 tablet 1  . Multiple Vitamin (MULTIVITAMIN) capsule Take 1 capsule by mouth daily. 100 capsule 4  . nitroGLYCERIN (NITROSTAT) 0.4 MG SL tablet Place 1 tablet (0.4 mg total) under the tongue every 5 (five) minutes as needed for chest pain. 30 tablet 12  . traMADol (ULTRAM) 50 MG tablet Take 1 tablet (50 mg total) by mouth every 8 (eight) hours as needed. (Patient not taking: Reported on 03/23/2019) 20 tablet 0   No current facility-administered medications for this visit.    Patient confirms/reports the following allergies:  Allergies  Allergen Reactions  . Brilinta [Ticagrelor] Shortness Of Breath    D/c by  cardiology    No orders of the defined types were placed in this encounter.   AUTHORIZATION INFORMATION Primary Insurance: 1D#: Group #:  Secondary Insurance: 1D#: Group #:  SCHEDULE INFORMATION: Date: 05/02/2019 Time: Location: [facility]

## 2019-03-24 NOTE — Patient Instructions (Signed)
Visit Information  Goals Addressed            This Visit's Progress     Patient Stated   . "We want to keep his blood sugars better controlled" (pt-stated)       Current Barriers:  . Diabetes: uncontrolled though improved, A1c yesterday 8.0% o Due to reapply for [employer] assistance through [employer]. Have all portions of the application besides proof of income. May have proof of income on file from last year's application.  o Needs proof of copay to show [employer] that he does not have Full Extra Help.  . Current antihyperglycemic regimen: Basaglar 56 units QAM, glipizide 5 mg QAM, metformin 1000 mg BID, Jardiance 25 mg  . Denies hypoglycemic symptoms . Cardiovascular risk reduction: o Current hypertensive regimen: enalapril 20 mg BID, doxazosin 2 mg daily, metoprolol tartrate 25 mg BID; hydralazine 10 mg BID; BP closer to goal <130/80 at last cardiology visit o Current hyperlipidemia regimen: atorvastatin 80 mg daily, LDL >100; cardiology started ezetimibe earlier this week but he has not started yet.   Pharmacist Clinical Goal(s):  [name] Over the next 90 days, patient with work with PharmD and primary care provider to address optimized medication management  Interventions: . Contacted [employer], explained need for proof of copay for Jardiance. They are faxing this to me at the office.  . Reviewed addition of ezetimibe. They will dispense a ~2 week supply now, and will add to the next month of adherence packaging for February.  . Will collaborate w/ CPhT on whether current income information we have is sufficient for PAP reapplication.   Patient Self Care Activities:  . Patient will check blood glucose BID , document, and provide at future appointments . Patient will take medications as prescribed . Patient will report any questions or concerns to provider   Please see past updates related to this goal by clicking on the "Past Updates" button in the selected goal           The patient verbalized understanding of instructions provided today and declined a print copy of patient instruction materials.   Plan: - Will collaborate w/ multidisciplinary team as above  Catie Feliz Beam, PharmD, [facility] Clinical Pharmacist [facility] [phone]

## 2019-03-27 NOTE — Assessment & Plan Note (Signed)
BPs improved with addition of hydralazine. Continue current regimen

## 2019-03-27 NOTE — Assessment & Plan Note (Signed)
Recheck lipids, adjust as needed 

## 2019-03-27 NOTE — Assessment & Plan Note (Signed)
Recheck A1C, adjust as needed. Continue working on lifestyle habit modifications

## 2019-03-27 NOTE — Assessment & Plan Note (Signed)
Improved with Trelegy. Continue current regimen

## 2019-03-29 ENCOUNTER — Other Ambulatory Visit: Payer: Self-pay | Admitting: Pharmacy Technician

## 2019-03-29 NOTE — Patient Outreach (Signed)
[facility]) Care Management  03/29/2019  SAMEER TEEPLE [birth year] [id number]    Received both patient and provider portion(s) of patient assistance application(s) for Jardiance with [employer] and Basaglar with [employer]. Faxed completed application and required documents into [employer] and [name].  Will follow up with company(ies) in 7-10 business days to check status of application(s).  Sosie Gato P. Shatana Saxton, CPhT Musician Care Management [phone]

## 2019-03-30 ENCOUNTER — Telehealth: Payer: Self-pay

## 2019-03-30 NOTE — Telephone Encounter (Signed)
Location and physician change made and patient notified.  New instructions will be mailed out.  Thanks [employer]

## 2019-04-11 ENCOUNTER — Other Ambulatory Visit: Payer: Self-pay | Admitting: Pharmacy Technician

## 2019-04-11 NOTE — Patient Outreach (Signed)
[facility]) Care Management  04/11/2019  JEBEDIAH MACRAE 07-19-1945 [id number]   ADDENDUM  Successful outgoing call placed to patient in regards to [name] application for Basaglar and [employer] application for Jardiance.  Spoke to patient's wife [name], HIPAA identifiers verified.  Informed Willa that patient's wife that he was approved for both companies and both medications for the duration of 2021.  Informed her that his  90 days supply of Jardiance with BI  was due to come this week.   Also informed her that her husband should be receiving a phone call from  [facility] which is [name]'s pharmacy. Informed her the number may show up as a [place] number. Informed her that they would be contacting him to set up his shipment of Basaglar.   Informed her that if the Jardiance did not arrive this week to call BI at [phone]. Also informed her that if he did not hear from [name] regarding his Basaglar delivery to call hem at [phone]. Willa verbalized understanding.  Informed Willa to use the same numbers throughout the year to order his refills. Confirmed patient's wife had name and number. Patient verbalized understanding.  Will route note to embedded [facility] RPh Catie Wilber Bihari that patient assistance has been completed and will remove myself from care team.  Stacie [place]. Tiah Heckel, CPhT Musician Care Management [phone]

## 2019-04-11 NOTE — Patient Outreach (Signed)
[facility]) Care Management  04/11/2019  Benjamin Chan 1945/10/10 [id number]  Two care coordination calls placed to [name] and [employer] in regards to patient's applications regarding a determination.  Spoke to [name] at [place] in regards to [name] who informed patient was APPROVED 04/04/2019-03/16/2020. She informed the order wa in progress as of 04/04/2019. She informed that the patient would be called and/or a message would be left when the medication is ready to be shipped.   Spoke to [name] at [place] in regards to [name] who informed patient was APPROVED 04/04/2019-03/17/2019. She informed an order was placed and the medication was due to be delivered this week via [employer] though she could not specify the exact day.   Will outreach patient with this information.  [name] P. Kelilah Hebard, CPhT Musician Care Management [phone]

## 2019-04-13 ENCOUNTER — Ambulatory Visit (INDEPENDENT_AMBULATORY_CARE_PROVIDER_SITE_OTHER): Payer: Medicare HMO | Admitting: Pharmacist

## 2019-04-13 DIAGNOSIS — N183 Chronic kidney disease, stage 3 unspecified: Secondary | ICD-10-CM

## 2019-04-13 DIAGNOSIS — E1122 Type 2 diabetes mellitus with diabetic chronic kidney disease: Secondary | ICD-10-CM

## 2019-04-13 DIAGNOSIS — E1165 Type 2 diabetes mellitus with hyperglycemia: Secondary | ICD-10-CM | POA: Diagnosis not present

## 2019-04-13 DIAGNOSIS — I129 Hypertensive chronic kidney disease with stage 1 through stage 4 chronic kidney disease, or unspecified chronic kidney disease: Secondary | ICD-10-CM | POA: Diagnosis not present

## 2019-04-13 DIAGNOSIS — IMO0002 Reserved for concepts with insufficient information to code with codable children: Secondary | ICD-10-CM

## 2019-04-13 DIAGNOSIS — E1129 Type 2 diabetes mellitus with other diabetic kidney complication: Secondary | ICD-10-CM

## 2019-04-13 DIAGNOSIS — E785 Hyperlipidemia, unspecified: Secondary | ICD-10-CM

## 2019-04-13 NOTE — Patient Instructions (Signed)
Visit Information  Goals Addressed            This Visit's Progress     Patient Stated   . "We want to keep his blood sugars better controlled" (pt-stated)       Current Barriers:  . Diabetes: uncontrolled though improved, A1c 8.0% o Patient notes his wife got the COVID vaccine. He wonders how/where he could get it . Current antihyperglycemic regimen: Basaglar 56 units QAM, glipizide 5 mg QAM, metformin 1000 mg BID, Jardiance 25 mg  o APPROVED for Jardiance assistance through [employer] through 03/16/20 o APPROVED for [employer] assistance through [place] through 03/16/20 . Denies hypoglycemic symptoms . Current blood sugar readings:  o Fastings generally 80-130s, occasional 140-150s if he had sweets the night before o Post prandial generally <200 . Cardiovascular risk reduction (Cardiovascular history, pertinent for CAD s/p NSTEMI 1+ years ago, HTN, HLD. Followed by Dr. Gwen Pounds) o Current hypertensive regimen: enalapril 20 mg BID, doxazosin 2 mg daily, metoprolol tartrate 25 mg BID; hydralazine 10 mg BID; home SBP 130-150s, though last clinic BP was much better controlled w/ SBP in 120s o Current hyperlipidemia regimen: atorvastatin 80 mg daily, ezetimibe 10 mg daily;    Pharmacist Clinical Goal(s):  [name] Over the next 90 days, patient with work with PharmD and primary care provider to address optimized medication management  Interventions: . Comprehensive medication review performed, reviewed pill pack contents as well. Patient's wife expresses appreciation for using adherence packing to help organize his medications . Provided phone number for [facility] Department COVID vaccine appointment sign up. Assisted patient in completing online application for [place] COVID vaccine waitlist, as patient does not have internet access.  . Reiterated that as [place] is coming from the patient assistance foundation program, it is NOT in the pill pack, and he must remember to take it  separately every morning.  . Reviewed goal A1c, goal fasting and goal 2 hour post prandial sugars . Reviewed goal BP   Patient Self Care Activities:  . Patient will check blood glucose BID , document, and provide at future appointments . Patient will take medications as prescribed . Patient will report any questions or concerns to provider   Please see past updates related to this goal by clicking on the "Past Updates" button in the selected goal          The patient verbalized understanding of instructions provided today and declined a print copy of patient instruction materials.  Plan:  - Scheduled f/u call 05/25/19  Catie Feliz Beam, PharmD, [facility] Clinical Pharmacist [facility] [phone]

## 2019-04-13 NOTE — Chronic Care Management (AMB) (Signed)
Chronic Care Management   Follow Up Note   04/13/2019 Name: PIUS BYROM MRN: [id number] DOB: 1945-11-13  Referred by: Guadalupe Maple, MD Reason for referral : Chronic Care Management (Medication Management)   ABDELAZIZ WESTENBERGER is a 74 y.o. year old male who is a primary care patient of [name], [name], MD. The CCM team was consulted for assistance with chronic disease management and care coordination needs.    Contacted patient and his wife, Wilford Sports, for medication management review.   Review of patient status, including review of consultants reports, relevant laboratory and other test results, and collaboration with appropriate care team members and the patient's provider was performed as part of comprehensive patient evaluation and provision of chronic care management services.    SDOH (Social Determinants of Health) screening performed today: Financial Strain . See Care Plan for related entries.   Outpatient Encounter Medications as of 04/13/2019  Medication Sig Note  . Accu-Chek FastClix Lancets MISC Use one lancet to check blood sugars between 1-4 times daily   . acetaminophen (TYLENOL) 325 MG tablet Take 650 mg by mouth every 6 (six) hours as needed.   [name] albuterol (PROVENTIL) (2.5 MG/3ML) 0.083% nebulizer solution Take 3 mLs (2.5 mg total) by nebulization every 6 (six) hours as needed for wheezing or shortness of breath. 04/13/2019: Uses QAM  . amLODipine (NORVASC) 10 MG tablet Take 1 tablet (10 mg total) by mouth daily.   [name] aspirin EC 81 MG tablet Take 1 tablet (81 mg total) by mouth daily.   [name] atorvastatin (LIPITOR) 80 MG tablet Take 1 tablet (80 mg total) by mouth daily.   [name] doxazosin (CARDURA) 2 MG tablet Take 1 tablet (2 mg total) by mouth daily.   . empagliflozin (JARDIANCE) 25 MG TABS tablet Take 25 mg by mouth daily.   . enalapril (VASOTEC) 20 MG tablet Take 1 tablet (20 mg total) by mouth 2 (two) times daily.   [name] ezetimibe (ZETIA) 10 MG tablet Take by mouth.   .  finasteride (PROSCAR) 5 MG tablet Take 1 tablet (5 mg total) by mouth daily.   . fluticasone (FLONASE) 50 MCG/ACT nasal spray Place 1 spray into both nostrils 2 (two) times daily. Pt takes twice a day   . Fluticasone-Umeclidin-Vilant (TRELEGY ELLIPTA) 100-62.5-25 MCG/INH AEPB Inhale 1 puff into the lungs daily.   [name] glipiZIDE (GLUCOTROL) 5 MG tablet Take 1 tablet (5 mg total) by mouth daily before breakfast.   . glucose blood (ACCU-CHEK AVIVA) test strip Use as instructed   . hydrALAZINE (APRESOLINE) 10 MG tablet Take 1 tablet (10 mg total) by mouth 2 (two) times daily.   . Insulin Glargine (BASAGLAR KWIKPEN) 100 UNIT/ML SOPN Inject 0.56 mLs (56 Units total) into the skin daily.   . Insulin Pen Needle (ADVOCATE INSULIN PEN NEEDLES) 31G X 5 MM MISC Inject 1 Units into the skin every morning.   . metFORMIN (GLUCOPHAGE-XR) 500 MG 24 hr tablet Take 2 tablets (1,000 mg total) by mouth 2 (two) times daily.   . metoprolol tartrate (LOPRESSOR) 25 MG tablet Take 1 tablet (25 mg total) by mouth 2 (two) times daily.   [name] albuterol (VENTOLIN HFA) 108 (90 Base) MCG/ACT inhaler Inhale 2 puffs into the lungs every 6 (six) hours as needed for wheezing or shortness of breath. (Patient not taking: Reported on 04/13/2019)   . nitroGLYCERIN (NITROSTAT) 0.4 MG SL tablet Place 1 tablet (0.4 mg total) under the tongue every 5 (five) minutes as needed for chest pain. (  Patient not taking: Reported on 04/13/2019)   . [DISCONTINUED] guaiFENesin (MUCINEX) 600 MG 12 hr tablet Take 1 tablet (600 mg total) by mouth 2 (two) times daily as needed.   . [DISCONTINUED] Multiple Vitamin (MULTIVITAMIN) capsule Take 1 capsule by mouth daily.   . [DISCONTINUED] traMADol (ULTRAM) 50 MG tablet Take 1 tablet (50 mg total) by mouth every 8 (eight) hours as needed. (Patient not taking: Reported on 03/23/2019)    No facility-administered encounter medications on file as of 04/13/2019.     Objective:   Goals Addressed            This Visit's  Progress     Patient Stated   . "We want to keep his blood sugars better controlled" (pt-stated)       Current Barriers:  . Diabetes: uncontrolled though improved, A1c 8.0% o Patient notes his wife got the COVID vaccine. He wonders how/where he could get it . Current antihyperglycemic regimen: Basaglar 56 units QAM, glipizide 5 mg QAM, metformin 1000 mg BID, Jardiance 25 mg  o APPROVED for Jardiance assistance through [employer] through 03/16/20 o APPROVED for [employer] assistance through [place] through 03/16/20 . Denies hypoglycemic symptoms . Current blood sugar readings:  o Fastings generally 80-130s, occasional 140-150s if he had sweets the night before o Post prandial generally <200 . Cardiovascular risk reduction (Cardiovascular history, pertinent for CAD s/p NSTEMI 1+ years ago, HTN, HLD. Followed by Dr. Gwen Pounds) o Current hypertensive regimen: enalapril 20 mg BID, doxazosin 2 mg daily, metoprolol tartrate 25 mg BID; hydralazine 10 mg BID; home SBP 130-150s, though last clinic BP was much better controlled w/ SBP in 120s o Current hyperlipidemia regimen: atorvastatin 80 mg daily, ezetimibe 10 mg daily;    Pharmacist Clinical Goal(s):  [name] Over the next 90 days, patient with work with PharmD and primary care provider to address optimized medication management  Interventions: . Comprehensive medication review performed, reviewed pill pack contents as well. Patient's wife expresses appreciation for using adherence packing to help organize his medications . Provided phone number for [facility] Department COVID vaccine appointment sign up. Assisted patient in completing online application for [place] COVID vaccine waitlist, as patient does not have internet access.  . Reiterated that as [place] Pepper is coming from the patient assistance foundation program, it is NOT in the pill pack, and he must remember to take it separately every morning.  . Reviewed goal A1c, goal fasting and goal 2  hour post prandial sugars . Reviewed goal BP   Patient Self Care Activities:  . Patient will check blood glucose BID , document, and provide at future appointments . Patient will take medications as prescribed . Patient will report any questions or concerns to provider   Please see past updates related to this goal by clicking on the "Past Updates" button in the selected goal           Plan:  - Scheduled f/u call 05/25/19  Catie Feliz Beam, PharmD, [facility] Clinical Pharmacist [facility] [phone]

## 2019-04-21 DIAGNOSIS — Z23 Encounter for immunization: Secondary | ICD-10-CM | POA: Diagnosis not present

## 2019-04-26 ENCOUNTER — Encounter: Payer: Self-pay | Admitting: Gastroenterology

## 2019-04-26 ENCOUNTER — Other Ambulatory Visit: Payer: Self-pay

## 2019-04-28 ENCOUNTER — Other Ambulatory Visit
Admission: RE | Admit: 2019-04-28 | Discharge: 2019-04-28 | Disposition: A | Discharge status: Home / Self Care | Payer: Medicare HMO | Source: Ambulatory Visit | Attending: Gastroenterology | Admitting: Gastroenterology

## 2019-04-28 ENCOUNTER — Other Ambulatory Visit: Payer: Self-pay

## 2019-04-28 DIAGNOSIS — Z01812 Encounter for preprocedural laboratory examination: Secondary | ICD-10-CM | POA: Diagnosis not present

## 2019-04-28 DIAGNOSIS — Z20822 Contact with and (suspected) exposure to covid-19: Secondary | ICD-10-CM | POA: Diagnosis not present

## 2019-04-28 LAB — SARS CORONAVIRUS 2 (TAT 6-24 HRS): SARS Coronavirus 2: NEGATIVE

## 2019-04-29 NOTE — Discharge Instructions (Signed)
General Anesthesia, Adult, Care After This sheet gives you information about how to care for yourself after your procedure. Your health care provider may also give you more specific instructions. If you have problems or questions, contact your health care provider. What can I expect after the procedure? After the procedure, the following side effects are common:  Pain or discomfort at the IV site.  Nausea.  Vomiting.  Sore throat.  Trouble concentrating.  Feeling cold or chills.  Weak or tired.  Sleepiness and fatigue.  Soreness and body aches. These side effects can affect parts of the body that were not involved in surgery. Follow these instructions at home:  For at least 24 hours after the procedure:  Have a responsible adult stay with you. It is important to have someone help care for you until you are awake and alert.  Rest as needed.  Do not: ? Participate in activities in which you could fall or become injured. ? Drive. ? Use heavy machinery. ? Drink alcohol. ? Take sleeping pills or medicines that cause drowsiness. ? Make important decisions or sign legal documents. ? Take care of children on your own. Eating and drinking  Follow any instructions from your health care provider about eating or drinking restrictions.  When you feel hungry, start by eating small amounts of foods that are soft and easy to digest (bland), such as toast. Gradually return to your regular diet.  Drink enough fluid to keep your urine pale yellow.  If you vomit, rehydrate by drinking water, juice, or clear broth. General instructions  If you have sleep apnea, surgery and certain medicines can increase your risk for breathing problems. Follow instructions from your health care provider about wearing your sleep device: ? Anytime you are sleeping, including during daytime naps. ? While taking prescription pain medicines, sleeping medicines, or medicines that make you drowsy.  Return to  your normal activities as told by your health care provider. Ask your health care provider what activities are safe for you.  Take over-the-counter and prescription medicines only as told by your health care provider.  If you smoke, do not smoke without supervision.  Keep all follow-up visits as told by your health care provider. This is important. Contact a health care provider if:  You have nausea or vomiting that does not get better with medicine.  You cannot eat or drink without vomiting.  You have pain that does not get better with medicine.  You are unable to pass urine.  You develop a skin rash.  You have a fever.  You have redness around your IV site that gets worse. Get help right away if:  You have difficulty breathing.  You have chest pain.  You have blood in your urine or stool, or you vomit blood. Summary  After the procedure, it is common to have a sore throat or nausea. It is also common to feel tired.  Have a responsible adult stay with you for the first 24 hours after general anesthesia. It is important to have someone help care for you until you are awake and alert.  When you feel hungry, start by eating small amounts of foods that are soft and easy to digest (bland), such as toast. Gradually return to your regular diet.  Drink enough fluid to keep your urine pale yellow.  Return to your normal activities as told by your health care provider. Ask your health care provider what activities are safe for you. This information is not   intended to replace advice given to you by your health care provider. Make sure you discuss any questions you have with your health care provider. Document Revised: 03/06/2017 Document Reviewed: 10/17/2016 Elsevier Patient Education  2020 Elsevier Inc.  

## 2019-05-02 ENCOUNTER — Ambulatory Visit: Payer: Medicare HMO | Admitting: Anesthesiology

## 2019-05-02 ENCOUNTER — Ambulatory Visit
Admission: RE | Admit: 2019-05-02 | Discharge: 2019-05-02 | Disposition: A | Discharge status: Home / Self Care | Payer: Medicare HMO | Attending: Gastroenterology | Admitting: Gastroenterology

## 2019-05-02 ENCOUNTER — Encounter
Admission: RE | Disposition: A | Discharge status: Home / Self Care | Payer: Self-pay | Source: Home / Self Care | Attending: Gastroenterology

## 2019-05-02 ENCOUNTER — Other Ambulatory Visit: Payer: Self-pay

## 2019-05-02 ENCOUNTER — Encounter: Payer: Self-pay | Admitting: Gastroenterology

## 2019-05-02 DIAGNOSIS — Z7982 Long term (current) use of aspirin: Secondary | ICD-10-CM | POA: Diagnosis not present

## 2019-05-02 DIAGNOSIS — Z7951 Long term (current) use of inhaled steroids: Secondary | ICD-10-CM | POA: Insufficient documentation

## 2019-05-02 DIAGNOSIS — I2510 Atherosclerotic heart disease of native coronary artery without angina pectoris: Secondary | ICD-10-CM | POA: Diagnosis not present

## 2019-05-02 DIAGNOSIS — Z955 Presence of coronary angioplasty implant and graft: Secondary | ICD-10-CM | POA: Diagnosis not present

## 2019-05-02 DIAGNOSIS — Z794 Long term (current) use of insulin: Secondary | ICD-10-CM | POA: Insufficient documentation

## 2019-05-02 DIAGNOSIS — I129 Hypertensive chronic kidney disease with stage 1 through stage 4 chronic kidney disease, or unspecified chronic kidney disease: Secondary | ICD-10-CM | POA: Diagnosis not present

## 2019-05-02 DIAGNOSIS — K5730 Diverticulosis of large intestine without perforation or abscess without bleeding: Secondary | ICD-10-CM | POA: Diagnosis not present

## 2019-05-02 DIAGNOSIS — N189 Chronic kidney disease, unspecified: Secondary | ICD-10-CM | POA: Insufficient documentation

## 2019-05-02 DIAGNOSIS — I252 Old myocardial infarction: Secondary | ICD-10-CM | POA: Insufficient documentation

## 2019-05-02 DIAGNOSIS — J449 Chronic obstructive pulmonary disease, unspecified: Secondary | ICD-10-CM | POA: Insufficient documentation

## 2019-05-02 DIAGNOSIS — K640 First degree hemorrhoids: Secondary | ICD-10-CM | POA: Diagnosis not present

## 2019-05-02 DIAGNOSIS — Z1211 Encounter for screening for malignant neoplasm of colon: Secondary | ICD-10-CM | POA: Insufficient documentation

## 2019-05-02 DIAGNOSIS — Z951 Presence of aortocoronary bypass graft: Secondary | ICD-10-CM | POA: Insufficient documentation

## 2019-05-02 DIAGNOSIS — Z79899 Other long term (current) drug therapy: Secondary | ICD-10-CM | POA: Diagnosis not present

## 2019-05-02 DIAGNOSIS — E1122 Type 2 diabetes mellitus with diabetic chronic kidney disease: Secondary | ICD-10-CM | POA: Insufficient documentation

## 2019-05-02 DIAGNOSIS — Z87891 Personal history of nicotine dependence: Secondary | ICD-10-CM | POA: Diagnosis not present

## 2019-05-02 HISTORY — PX: COLONOSCOPY WITH PROPOFOL: SHX5780

## 2019-05-02 HISTORY — DX: Unspecified osteoarthritis, unspecified site: M19.90

## 2019-05-02 LAB — GLUCOSE, CAPILLARY
Glucose-Capillary: 115 mg/dL — ABNORMAL HIGH (ref 70–99)
Glucose-Capillary: 116 mg/dL — ABNORMAL HIGH (ref 70–99)

## 2019-05-02 SURGERY — COLONOSCOPY WITH PROPOFOL
Anesthesia: General | Site: Rectum

## 2019-05-02 MED ORDER — PROPOFOL 10 MG/ML IV BOLUS
INTRAVENOUS | Status: DC | PRN
  Administered 2019-05-02 (×4): 30 mg via INTRAVENOUS
  Administered 2019-05-02: 80 mg via INTRAVENOUS
  Administered 2019-05-02: 20 mg via INTRAVENOUS
  Administered 2019-05-02: 30 mg via INTRAVENOUS

## 2019-05-02 MED ORDER — LIDOCAINE HCL (CARDIAC) PF 100 MG/5ML IV SOSY
PREFILLED_SYRINGE | INTRAVENOUS | Status: DC | PRN
  Administered 2019-05-02: 30 mg via INTRAVENOUS

## 2019-05-02 MED ORDER — LACTATED RINGERS IV SOLN: INTRAVENOUS | Status: DC

## 2019-05-02 MED ORDER — STERILE WATER FOR IRRIGATION IR SOLN
Status: DC | PRN
  Administered 2019-05-02: 08:00:00 50 mL

## 2019-05-02 SURGICAL SUPPLY — 5 items
CANISTER SUCT 1200ML W/VALVE (MISCELLANEOUS) ×2 IMPLANT
GOWN CVR UNV OPN BCK APRN NK (MISCELLANEOUS) ×2 IMPLANT
GOWN ISOL THUMB LOOP REG UNIV (MISCELLANEOUS) ×2
KIT ENDO PROCEDURE OLY (KITS) ×2 IMPLANT
WATER STERILE IRR 250ML POUR (IV SOLUTION) ×2 IMPLANT

## 2019-05-02 NOTE — Anesthesia Preprocedure Evaluation (Signed)
Anesthesia Evaluation    Airway Mallampati: II  TM Distance: >3 FB Neck ROM: Full    Dental   Pulmonary former smoker,    Pulmonary exam normal        Cardiovascular hypertension, + CAD and + Past MI  Normal cardiovascular exam     Neuro/Psych    GI/Hepatic   Endo/Other  diabetes  Renal/GU CRFRenal disease     Musculoskeletal   Abdominal   Peds  Hematology   Anesthesia Other Findings   Reproductive/Obstetrics                             Anesthesia Physical Anesthesia Plan  ASA: III  Anesthesia Plan: General   Post-op Pain Management:    Induction: Intravenous  PONV Risk Score and Plan:   Airway Management Planned: Natural Airway  Additional Equipment:   Intra-op Plan:   Post-operative Plan:   Informed Consent: I have reviewed the patients History and Physical, chart, labs and discussed the procedure including the risks, benefits and alternatives for the proposed anesthesia with the patient or authorized representative who has indicated his/her understanding and acceptance.       Plan Discussed with: CRNA, Anesthesiologist and Surgeon  Anesthesia Plan Comments:         Anesthesia Quick Evaluation

## 2019-05-02 NOTE — Anesthesia Postprocedure Evaluation (Signed)
Anesthesia Post Note  Patient: Benjamin Chan  Procedure(s) Performed: COLONOSCOPY WITH PROPOFOL (N/A Rectum)     Patient location during evaluation: PACU Anesthesia Type: General Level of consciousness: awake and alert Pain management: pain level controlled Vital Signs Assessment: post-procedure vital signs reviewed and stable Respiratory status: spontaneous breathing, nonlabored ventilation, respiratory function stable and patient connected to nasal cannula oxygen Cardiovascular status: blood pressure returned to baseline and stable Postop Assessment: no apparent nausea or vomiting Anesthetic complications: no    Gayland Curry Harika Laidlaw

## 2019-05-02 NOTE — H&P (Signed)
Benjamin Minium, MD [facility] [address], [address] [place], [place] [address] Phone: [phone] Fax : [phone]  Primary Care Physician:  Armando Gang, FNP Primary Gastroenterologist:  Dr. Servando Snare  Pre-Procedure History & Physical: HPI:  Benjamin Chan is a 74 y.o. male is here for a screening colonoscopy.   Past Medical History:  Diagnosis Date  . Arthritis    shoulder  . BPH (benign prostatic hyperplasia)   . CAD (coronary artery disease)   . Chronic kidney disease   . COPD (chronic obstructive pulmonary disease) (HCC)   . Diabetes mellitus without complication (HCC)   . Hypertension   . MI (myocardial infarction) (HCC) 03/2017    Past Surgical History:  Procedure Laterality Date  . CORONARY ARTERY BYPASS GRAFT  2006   2 vessel  . CORONARY STENT INTERVENTION N/A 03/30/2017   Procedure: CORONARY STENT INTERVENTION;  Surgeon: Alwyn Pea, MD;  Location: [facility] INVASIVE CV LAB;  Service: Cardiovascular;  Laterality: N/A;  . INGUINAL HERNIA REPAIR     right  . LEFT HEART CATH AND CORS/GRAFTS ANGIOGRAPHY N/A 03/30/2017   Procedure: LEFT HEART CATH AND CORS/GRAFTS ANGIOGRAPHY;  Surgeon: Lamar Blinks, MD;  Location: [facility] INVASIVE CV LAB;  Service: Cardiovascular;  Laterality: N/A;    Prior to Admission medications   Medication Sig Start Date End Date Taking? Authorizing Provider  acetaminophen (TYLENOL) 325 MG tablet Take 650 mg by mouth every 6 (six) hours as needed.   Yes [provider]  albuterol (PROVENTIL) (2.5 MG/3ML) 0.083% nebulizer solution Take 3 mLs (2.5 mg total) by nebulization every 6 (six) hours as needed for wheezing or shortness of breath. 03/23/19  Yes [name], PA-C  albuterol (VENTOLIN HFA) 108 (90 Base) MCG/ACT inhaler Inhale 2 puffs into the lungs every 6 (six) hours as needed for wheezing or shortness of breath. 11/28/18  Yes [name], [name], MD  amLODipine (NORVASC) 10 MG tablet Take 1 tablet (10 mg total) by mouth  daily. 11/28/18  Yes Steele Sizer, MD  aspirin EC 81 MG tablet Take 1 tablet (81 mg total) by mouth daily. 11/28/18  Yes [name], [name], MD  atorvastatin (LIPITOR) 80 MG tablet Take 1 tablet (80 mg total) by mouth daily. 11/28/18  Yes [name], [name], MD  doxazosin (CARDURA) 2 MG tablet Take 1 tablet (2 mg total) by mouth daily. 11/28/18  Yes [name], [name], MD  empagliflozin (JARDIANCE) 25 MG TABS tablet Take 25 mg by mouth daily. 07/28/18  Yes [name], [name], MD  enalapril (VASOTEC) 20 MG tablet Take 1 tablet (20 mg total) by mouth 2 (two) times daily. 11/28/18  Yes [name], [name], MD  ezetimibe (ZETIA) 10 MG tablet Take by mouth. 03/22/19 03/21/20 Yes [provider]  finasteride (PROSCAR) 5 MG tablet Take 1 tablet (5 mg total) by mouth daily. 11/28/18  Yes [name], [name], MD  fluticasone (FLONASE) 50 MCG/ACT nasal spray Place 1 spray into both nostrils 2 (two) times daily. Pt takes twice a day 11/28/18  Yes [name], [name], MD  Fluticasone-Umeclidin-Vilant (TRELEGY ELLIPTA) 100-62.5-25 MCG/INH AEPB Inhale 1 puff into the lungs daily. 11/28/18  Yes Steele Sizer, MD  glipiZIDE (GLUCOTROL) 5 MG tablet Take 1 tablet (5 mg total) by mouth daily before breakfast. 03/23/19  Yes [name], PA-C  hydrALAZINE (APRESOLINE) 10 MG tablet Take 1 tablet (10 mg total) by mouth 2 (two) times daily. 03/23/19  Yes [name], PA-C  Insulin Glargine [facility]  KWIKPEN) 100 UNIT/ML SOPN Inject 0.56 mLs (56 Units total) into the skin daily. 03/23/19  Yes [name], PA-C  metFORMIN (GLUCOPHAGE-XR) 500 MG 24 hr tablet Take 2 tablets (1,000 mg total) by mouth 2 (two) times daily. 11/28/18  Yes [name], [name], MD  metoprolol tartrate (LOPRESSOR) 25 MG tablet Take 1 tablet (25 mg total) by mouth 2 (two) times daily. 02/14/19  Yes [name], PA-C  nitroGLYCERIN (NITROSTAT) 0.4 MG SL tablet Place 1 tablet (0.4 mg total) under the tongue every 5 (five) minutes as  needed for chest pain. 12/01/18  Yes Steele Sizer, MD  Accu-Chek FastClix Lancets MISC Use one lancet to check blood sugars between 1-4 times daily 12/01/18   Steele Sizer, MD  glucose blood (ACCU-CHEK AVIVA) test strip Use as instructed 12/01/18   Steele Sizer, MD  Insulin Pen Needle (ADVOCATE INSULIN PEN NEEDLES) 31G X 5 MM MISC Inject 1 Units into the skin every morning. 12/01/18   Steele Sizer, MD    Allergies as of 03/24/2019 - Review Complete 03/23/2019  Allergen Reaction Noted  . Brilinta [ticagrelor] Shortness Of Breath 07/27/2018    Family History  Problem Relation Age of Onset  . Hypertension Father   . Hypertension Mother   . Hypertension Sister   . Hypertension Brother   . Hypertension Brother   . Hypertension Brother   . Hypertension Brother   . Hypertension Brother   . Hypertension Sister   . Hypertension Sister   . Hypertension Sister   . Hypertension Sister   . Hypertension Sister     Social History   Socioeconomic History  . Marital status: Married    Spouse name: Not on file  . Number of children: Not on file  . Years of education: Not on file  . Highest education level: 9th grade  Occupational History  . Occupation: retired  Tobacco Use  . Smoking status: Former Smoker    Years: 40.00    Types: Cigarettes    Quit date: 2010    Years since quitting: 11.1  . Smokeless tobacco: Never Used  Substance and Sexual Activity  . Alcohol use: No  . Drug use: No  . Sexual activity: Not on file  Other Topics Concern  . 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 Difficulty of Paying Living Expenses: Not on file  Food Insecurity:   . Worried About Programme researcher, broadcasting/film/video in the Last Year: Not on file  . Ran Out of Food in the Last Year: Not on file  Transportation Needs:   . Lack of Transportation (Medical): Not on file  . Lack  of Transportation (Non-Medical): Not on file  Physical Activity:   . Days of Exercise per Week: Not on file  . Minutes of Exercise per Session: Not on file  Stress:   . Feeling of Stress : Not on file  Social Connections:   . Frequency of Communication with Friends and Family: Not on file  . Frequency of Social Gatherings with Friends and Family: Not on file  . Attends Religious Services: Not on file  . Active Member of Clubs or Organizations: Not on file  . Attends Banker Meetings: Not on file  . Marital Status: Not on file  Intimate Partner Violence:   . Fear of Current  or Ex-Partner: Not on file  . Emotionally Abused: Not on file  . Physically Abused: Not on file  . Sexually Abused: Not on file    Review of Systems: See HPI, otherwise negative ROS  Physical Exam: BP (!) 169/78   Pulse 78   Temp 97.8 F (36.6 C) (Temporal)   Ht 5\' 6"  (1.676 m)   Wt 98.9 kg   SpO2 97%   BMI 35.19 kg/m  General:   Alert,  pleasant and cooperative in NAD Head:  Normocephalic and atraumatic. Neck:  Supple; no masses or thyromegaly. Lungs:  Clear throughout to auscultation.    Heart:  Regular rate and rhythm. Abdomen:  Soft, nontender and nondistended. Normal bowel sounds, without guarding, and without rebound.   Neurologic:  Alert and  oriented x4;  grossly normal neurologically.  Impression/Plan: LAMONTE HARTT is now here to undergo a screening colonoscopy.  Risks, benefits, and alternatives regarding colonoscopy have been reviewed with the patient.  Questions have been answered.  All parties agreeable.

## 2019-05-02 NOTE — Op Note (Signed)
[facility] Patient Name: Benjamin Chan Procedure Date: 05/02/2019 7:15 AM MRN: [id number] Account #: [id number] Date of Birth: 1945/12/13 Admit Type: Outpatient Age: 74 Room: [facility] OR ROOM 01 Gender: Male Note Status: Finalized Procedure:             Colonoscopy Indications:           Screening for colorectal malignant neoplasm Providers:             Midge Minium MD, MD Referring MD:          Steele Sizer, MD (Referring MD) Medicines:             Propofol per Anesthesia Complications:         No immediate complications. Procedure:             Pre-Anesthesia Assessment:                        - Prior to the procedure, a History and Physical was                         performed, and patient medications and allergies were                         reviewed. The patient's tolerance of previous                         anesthesia was also reviewed. The risks and benefits                         of the procedure and the sedation options and risks                         were discussed with the patient. All questions were                         answered, and informed consent was obtained. Prior                         Anticoagulants: The patient has taken no previous                         anticoagulant or antiplatelet agents. ASA Grade                         Assessment: II - A patient with mild systemic disease.                         After reviewing the risks and benefits, the patient                         was deemed in satisfactory condition to undergo the                         procedure.                        After obtaining informed consent, the colonoscope was  passed under direct vision. Throughout the procedure,                         the patient's blood pressure, pulse, and oxygen                         saturations were monitored continuously. The                         Colonoscope was introduced through  the anus and                         advanced to the the cecum, identified by appendiceal                         orifice and ileocecal valve. The colonoscopy was                         performed without difficulty. The patient tolerated                         the procedure well. The quality of the bowel                         preparation was fair. Findings:      The perianal and digital rectal examinations were normal.      Non-bleeding internal hemorrhoids were found during retroflexion. The       hemorrhoids were Grade I (internal hemorrhoids that do not prolapse).      A few small-mouthed diverticula were found in the sigmoid colon. Impression:            - Preparation of the colon was fair.                        - Non-bleeding internal hemorrhoids.                        - Diverticulosis in the sigmoid colon.                        - No specimens collected. Recommendation:        - Discharge patient to home.                        - Resume previous diet.                        - Continue present medications. Procedure Code(s):     --- Professional ---                        [phone], Colonoscopy, flexible; diagnostic, including                         collection of specimen(s) by brushing or washing, when                         performed (separate procedure) Diagnosis Code(s):     --- Professional ---  Z12.11, Encounter for screening for malignant neoplasm                         of colon CPT copyright 2019 American Medical Association. All rights reserved. The codes documented in this report are preliminary and upon coder review may  be revised to meet current compliance requirements. Midge Minium MD, MD 05/02/2019 8:17:33 AM This report has been signed electronically. Number of Addenda: 0 Note Initiated On: 05/02/2019 7:15 AM Scope Withdrawal Time: 0 hours 9 minutes 43 seconds  Total Procedure Duration: 0 hours 14 minutes 1 second  Estimated Blood Loss:   Estimated blood loss: none.      [facility]

## 2019-05-02 NOTE — Transfer of Care (Addendum)
Immediate Anesthesia Transfer of Care Note  Patient: Benjamin Chan  Procedure(s) Performed: COLONOSCOPY WITH PROPOFOL (N/A Rectum)  Patient Location: PACU  Anesthesia Type: General  Level of Consciousness: awake, alert  and patient cooperative  Airway and Oxygen Therapy: Patient Spontanous Breathing and Patient connected to supplemental oxygen  Post-op Assessment: Post-op Vital signs reviewed, Patient's Cardiovascular Status Stable, Respiratory Function Stable, Patent Airway and No signs of Nausea or vomiting  Post-op Vital Signs: Reviewed and stable  Complications: No apparent anesthesia complications

## 2019-05-02 NOTE — Anesthesia Procedure Notes (Signed)
Date/Time: 05/02/2019 7:54 AM Performed by: Maree Krabbe, CRNA Pre-anesthesia Checklist: Patient identified, Emergency Drugs available, Suction available, Timeout performed and Patient being monitored Patient Re-evaluated:Patient Re-evaluated prior to induction Oxygen Delivery Method: Nasal cannula Placement Confirmation: positive ETCO2

## 2019-05-03 ENCOUNTER — Encounter: Payer: Self-pay | Admitting: *Deleted

## 2019-05-19 DIAGNOSIS — Z23 Encounter for immunization: Secondary | ICD-10-CM | POA: Diagnosis not present

## 2019-05-25 ENCOUNTER — Ambulatory Visit (INDEPENDENT_AMBULATORY_CARE_PROVIDER_SITE_OTHER): Payer: Medicare HMO | Admitting: Pharmacist

## 2019-05-25 DIAGNOSIS — E1165 Type 2 diabetes mellitus with hyperglycemia: Secondary | ICD-10-CM

## 2019-05-25 DIAGNOSIS — IMO0002 Reserved for concepts with insufficient information to code with codable children: Secondary | ICD-10-CM

## 2019-05-25 DIAGNOSIS — E1129 Type 2 diabetes mellitus with other diabetic kidney complication: Secondary | ICD-10-CM

## 2019-05-25 DIAGNOSIS — I129 Hypertensive chronic kidney disease with stage 1 through stage 4 chronic kidney disease, or unspecified chronic kidney disease: Secondary | ICD-10-CM | POA: Diagnosis not present

## 2019-05-25 DIAGNOSIS — J449 Chronic obstructive pulmonary disease, unspecified: Secondary | ICD-10-CM

## 2019-05-25 NOTE — Patient Instructions (Signed)
Visit Information  Goals Addressed            This Visit's Progress     Patient Stated   . "We want to keep his blood sugars better controlled" (pt-stated)       CARE PLAN ENTRY (see longtitudinal plan of care for additional care plan information)  Current Barriers:  . Diabetes: uncontrolled though improved, A1c 8.0% o Patient reports he got his first COVID shot last week . Current antihyperglycemic regimen: Basaglar 56 units QAM, glipizide 5 mg QAM, metformin 1000 mg BID, Jardiance 25 mg  o APPROVED for Jardiance assistance through [employer] through 03/16/20 o APPROVED for [employer] assistance through [place] through 03/16/20 . Current blood sugar readings:  o Fastings: Average ~119; range 97-170 o Evening (about 2-3 hours after supper): 70-200 . Cardiovascular risk reduction (CAD s/p NSTEMI 1+ years ago, HTN, HLD. Followed by Dr. Gwen Pounds) o Current hypertensive regimen: enalapril 20 mg BID, doxazosin 2 mg daily, metoprolol tartrate 25 mg BID; hydralazine 10 mg BID; reporting home BP 140-160s/60-80s, though last office BP was well controlled o Current hyperlipidemia regimen: atorvastatin 80 mg daily, ezetimibe 10 mg daily;    Pharmacist Clinical Goal(s):  [name] Over the next 90 days, patient with work with PharmD and primary care provider to address optimized medication management  Interventions: . Comprehensive medication review performed, medication list updated in electronic medical record. Patient's wife read off the pill box to me, which I also cross referenced with his fill history in Epic.  . Praised for regular BG checks and documentation. Pending A1c, consider addition of GLP1 (w/ d/c of glipizide and dose reduction of Basaglar) to add more post prandial meal coverage, if A1c is still elevated. Fastings appear to be generally very well controlled.  . Praised for regular BP checks. Mismatch between home readings and last clinic reading. Recommended patient bring home BP readings AND home  cuff w/ him to appointment w/ PCP next month for comparison  Patient Self Care Activities:  . Patient will check blood glucose BID , document, and provide at future appointments . Patient will take medications as prescribed . Patient will report any questions or concerns to provider   Please see past updates related to this goal by clicking on the "Past Updates" button in the selected goal          Patient verbalizes understanding of instructions provided today.    Plan:  - Scheduled f/u call 07/20/19  Catie Feliz Beam, PharmD, [facility] Clinical Pharmacist [facility] [phone]

## 2019-05-25 NOTE — Chronic Care Management (AMB) (Signed)
Chronic Care Management   Follow Up Note   05/25/2019 Name: Benjamin Chan MRN: [id number] DOB: 01-18-1946  Referred by: Benjamin Roof, PA Reason for referral : Chronic Care Management (Medication Management)   Benjamin Chan is a 74 y.o. year old male who is a primary care patient of Benjamin Chan, [place]. The CCM team was consulted for assistance with chronic disease management and care coordination needs.    Contacted patient and his wife for medication management review.   Review of patient status, including review of consultants reports, relevant laboratory and other test results, and collaboration with appropriate care team members and the patient's provider was performed as part of comprehensive patient evaluation and provision of chronic care management services.    SDOH (Social Determinants of Health) assessments performed: Yes See Care Plan activities for detailed interventions related to Benjamin Chan)     Outpatient Encounter Medications as of 05/25/2019  Medication Sig Note  . Accu-Chek FastClix Lancets MISC Use one lancet to check blood sugars between 1-4 times daily   . albuterol (VENTOLIN HFA) 108 (90 Base) MCG/ACT inhaler Inhale 2 puffs into the lungs every 6 (six) hours as needed for wheezing or shortness of breath. 05/25/2019: Using BID  . amLODipine (NORVASC) 10 MG tablet Take 1 tablet (10 mg total) by mouth daily.   [name] aspirin EC 81 MG tablet Take 1 tablet (81 mg total) by mouth daily.   [name] atorvastatin (LIPITOR) 80 MG tablet Take 1 tablet (80 mg total) by mouth daily.   [name] doxazosin (CARDURA) 2 MG tablet Take 1 tablet (2 mg total) by mouth daily.   . empagliflozin (JARDIANCE) 25 MG TABS tablet Take 25 mg by mouth daily.   . enalapril (VASOTEC) 20 MG tablet Take 1 tablet (20 mg total) by mouth 2 (two) times daily.   [name] ezetimibe (ZETIA) 10 MG tablet Take by mouth.   . finasteride (PROSCAR) 5 MG tablet Take 1 tablet (5 mg total) by mouth daily.   . fluticasone (FLONASE) 50  MCG/ACT nasal spray Place 1 spray into both nostrils 2 (two) times daily. Pt takes twice a day 05/25/2019: Using PRN  . Fluticasone-Umeclidin-Vilant (TRELEGY ELLIPTA) 100-62.5-25 MCG/INH AEPB Inhale 1 puff into the lungs daily.   [name] glipiZIDE (GLUCOTROL) 5 MG tablet Take 1 tablet (5 mg total) by mouth daily before breakfast.   . glucose blood (ACCU-CHEK AVIVA) test strip Use as instructed   . hydrALAZINE (APRESOLINE) 10 MG tablet Take 1 tablet (10 mg total) by mouth 2 (two) times daily.   . Insulin Glargine (BASAGLAR KWIKPEN) 100 UNIT/ML SOPN Inject 0.56 mLs (56 Units total) into the skin daily.   . Insulin Pen Needle (ADVOCATE INSULIN PEN NEEDLES) 31G X 5 MM MISC Inject 1 Units into the skin every morning.   . metFORMIN (GLUCOPHAGE-XR) 500 MG 24 hr tablet Take 2 tablets (1,000 mg total) by mouth 2 (two) times daily.   . metoprolol tartrate (LOPRESSOR) 25 MG tablet Take 1 tablet (25 mg total) by mouth 2 (two) times daily.   [name] acetaminophen (TYLENOL) 325 MG tablet Take 650 mg by mouth every 6 (six) hours as needed.   [name] albuterol (PROVENTIL) (2.5 MG/3ML) 0.083% nebulizer solution Take 3 mLs (2.5 mg total) by nebulization every 6 (six) hours as needed for wheezing or shortness of breath. (Patient not taking: Reported on 05/25/2019) 04/13/2019: Uses QAM  . nitroGLYCERIN (NITROSTAT) 0.4 MG SL tablet Place 1 tablet (0.4 mg total) under the tongue every 5 (five) minutes as  needed for chest pain.    No facility-administered encounter medications on file as of 05/25/2019.     Objective:   Goals Addressed            This Visit's Progress     Patient Stated   . "We want to keep his blood sugars better controlled" (pt-stated)       CARE PLAN ENTRY (see longtitudinal plan of care for additional care plan information)  Current Barriers:  . Diabetes: uncontrolled though improved, A1c 8.0% o Patient reports he got his first COVID shot last week . Current antihyperglycemic regimen: Basaglar 56 units QAM,  glipizide 5 mg QAM, metformin 1000 mg BID, Jardiance 25 mg  o APPROVED for Jardiance assistance through [employer] through 03/16/20 o APPROVED for [employer] assistance through [place] through 03/16/20 . Current blood sugar readings:  o Fastings: Average ~119; range 97-170 o Evening (about 2-3 hours after supper): 70-200 . Cardiovascular risk reduction (CAD s/p NSTEMI 1+ years ago, HTN, HLD. Followed by Dr. Gwen Pounds) o Current hypertensive regimen: enalapril 20 mg BID, doxazosin 2 mg daily, metoprolol tartrate 25 mg BID; hydralazine 10 mg BID; reporting home BP 140-160s/60-80s, though last office BP was well controlled o Current hyperlipidemia regimen: atorvastatin 80 mg daily, ezetimibe 10 mg daily;    Pharmacist Clinical Goal(s):  [name] Over the next 90 days, patient with work with PharmD and primary care provider to address optimized medication management  Interventions: . Comprehensive medication review performed, medication list updated in electronic medical record. Patient's wife read off the pill box to me, which I also cross referenced with his fill history in Epic.  . Praised for regular BG checks and documentation. Pending A1c, consider addition of GLP1 (w/ d/c of glipizide and dose reduction of Basaglar) to add more post prandial meal coverage, if A1c is still elevated. Fastings appear to be generally very well controlled.  . Praised for regular BP checks. Mismatch between home readings and last clinic reading. Recommended patient bring home BP readings AND home cuff w/ him to appointment w/ PCP next month for comparison  Patient Self Care Activities:  . Patient will check blood glucose BID , document, and provide at future appointments . Patient will take medications as prescribed . Patient will report any questions or concerns to provider   Please see past updates related to this goal by clicking on the "Past Updates" button in the selected goal           Plan:  - Scheduled f/u call  07/20/19  Catie Feliz Beam, PharmD, [facility] Clinical Pharmacist [facility] [phone]

## 2019-06-16 DIAGNOSIS — I219 Acute myocardial infarction, unspecified: Secondary | ICD-10-CM

## 2019-06-16 HISTORY — DX: Acute myocardial infarction, unspecified: I21.9

## 2019-06-27 ENCOUNTER — Encounter: Payer: Self-pay | Admitting: Family Medicine

## 2019-06-27 ENCOUNTER — Ambulatory Visit (INDEPENDENT_AMBULATORY_CARE_PROVIDER_SITE_OTHER): Payer: Medicare HMO | Admitting: Family Medicine

## 2019-06-27 ENCOUNTER — Other Ambulatory Visit: Payer: Self-pay

## 2019-06-27 VITALS — BP 147/67 | HR 62 | Temp 98.6°F | Wt 218.0 lb

## 2019-06-27 DIAGNOSIS — E1129 Type 2 diabetes mellitus with other diabetic kidney complication: Secondary | ICD-10-CM | POA: Diagnosis not present

## 2019-06-27 DIAGNOSIS — I10 Essential (primary) hypertension: Secondary | ICD-10-CM

## 2019-06-27 DIAGNOSIS — I25810 Atherosclerosis of coronary artery bypass graft(s) without angina pectoris: Secondary | ICD-10-CM

## 2019-06-27 DIAGNOSIS — I152 Hypertension secondary to endocrine disorders: Secondary | ICD-10-CM

## 2019-06-27 DIAGNOSIS — I252 Old myocardial infarction: Secondary | ICD-10-CM | POA: Diagnosis not present

## 2019-06-27 DIAGNOSIS — J449 Chronic obstructive pulmonary disease, unspecified: Secondary | ICD-10-CM | POA: Diagnosis not present

## 2019-06-27 DIAGNOSIS — IMO0002 Reserved for concepts with insufficient information to code with codable children: Secondary | ICD-10-CM

## 2019-06-27 DIAGNOSIS — E1165 Type 2 diabetes mellitus with hyperglycemia: Secondary | ICD-10-CM | POA: Diagnosis not present

## 2019-06-27 DIAGNOSIS — E1159 Type 2 diabetes mellitus with other circulatory complications: Secondary | ICD-10-CM

## 2019-06-27 MED ORDER — HYDRALAZINE HCL 25 MG PO TABS: 25.0000 mg | ORAL_TABLET | Freq: Two times a day (BID) | ORAL | 2 refills | Status: DC

## 2019-06-27 NOTE — Progress Notes (Signed)
BP (!) 147/67   Pulse 62   Temp 98.6 F (37 C) (Oral)   Wt 218 lb (98.9 kg)   SpO2 92%   BMI 35.19 kg/m    Subjective:    Patient ID: Benjamin Chan, male    DOB: 05/28/1945, 74 y.o.   MRN: [id number]  HPI: Benjamin Chan is a 74 y.o. male  Chief Complaint  Patient presents with  . Diabetes  . Hypertension  . Hyperlipidemia   Here today for chronic condition mgmt.   Home BSs running below 160s typically. Every once in while having low blood sugars if forgetting to eat regularly. Taking 56 units of insulin daily in addition to metformin, jardiance, and glipizide regimen. Trying to start eating better and be more active.   Home BPs running around 140/60s. Tolerating regimen well. Denies CP, SOB, HAs dizziness.   HLD, CAD, Hx of MI - Denies claudication, myalgias, DOE, anginal episodes. Has not needed any nitroglycerin. Taking zetia and high dose lipitor without issue.   COPD - breathing much improved since starting Trelegy, no recent exacerbations. Albuterol on board for prn use  Relevant past medical, surgical, family and social history reviewed and updated as indicated. Interim medical history since our last visit reviewed. Allergies and medications reviewed and updated.  Review of Systems  Per HPI unless specifically indicated above     Objective:    BP (!) 147/67   Pulse 62   Temp 98.6 F (37 C) (Oral)   Wt 218 lb (98.9 kg)   SpO2 92%   BMI 35.19 kg/m   Wt Readings from Last 3 Encounters:  06/27/19 218 lb (98.9 kg)  05/02/19 218 lb 0.6 oz (98.9 kg)  03/23/19 218 lb (98.9 kg)    Physical Exam Vitals and nursing note reviewed.  Constitutional:      Appearance: Normal appearance.  HENT:     Head: Atraumatic.  Eyes:     Extraocular Movements: Extraocular movements intact.     Conjunctiva/sclera: Conjunctivae normal.  Cardiovascular:     Rate and Rhythm: Normal rate and regular rhythm.  Pulmonary:     Effort: Pulmonary effort is normal.   Breath sounds: Normal breath sounds.  Musculoskeletal:        General: Normal range of motion.     Cervical back: Normal range of motion and neck supple.  Skin:    General: Skin is warm and dry.  Neurological:     General: No focal deficit present.     Mental Status: He is oriented to person, place, and time.  Psychiatric:        Mood and Affect: Mood normal.        Thought Content: Thought content normal.        Judgment: Judgment normal.     Results for orders placed or performed in visit on 06/27/19  Comprehensive metabolic panel  Result Value Ref Range   Glucose 141 (H) 65 - 99 mg/dL   BUN 17 8 - 27 mg/dL   Creatinine, Ser 4.09 (H) 0.76 - 1.27 mg/dL   GFR calc non Af Amer 45 (L) >59 mL/min/1.73   GFR calc Af Amer 52 (L) >59 mL/min/1.73   BUN/Creatinine Ratio 11 10 - 24   Sodium 140 134 - 144 mmol/L   Potassium 4.4 3.5 - 5.2 mmol/L   Chloride 105 96 - 106 mmol/L   CO2 21 20 - 29 mmol/L   Calcium 9.0 8.6 - 10.2 mg/dL   Total  Protein 6.7 6.0 - 8.5 g/dL   Albumin 4.1 3.7 - 4.7 g/dL   Globulin, Total 2.6 1.5 - 4.5 g/dL   Albumin/Globulin Ratio 1.6 1.2 - 2.2   Bilirubin Total 0.4 0.0 - 1.2 mg/dL   Alkaline Phosphatase 107 39 - 117 IU/L   AST 21 0 - 40 IU/L   ALT 19 0 - 44 IU/L  Lipid Panel w/o Chol/HDL Ratio  Result Value Ref Range   Cholesterol, Total 116 100 - 199 mg/dL   Triglycerides 52 0 - 149 mg/dL   HDL 40 >39 mg/dL   VLDL Cholesterol Cal 12 5 - 40 mg/dL   LDL Chol Calc (NIH) 64 0 - 99 mg/dL  HgB A1c  Result Value Ref Range   Hgb A1c MFr Bld 7.6 (H) 4.8 - 5.6 %   Est. average glucose Bld gHb Est-mCnc 171 mg/dL      Assessment & Plan:   Problem List Items Addressed This Visit      Cardiovascular and Mediastinum   Hypertension associated with diabetes ([place]) - Primary    Still mildly above goal, increase hydralazine to 25 mg BID and continue remainder of regimen as is. 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 Continue working on diet stability and improvements in diet for better control      Relevant Orders   HgB A1c (Completed)     Other   History of myocardial infarction    Stable, continue to monitor lipids and working on lifestyle modifications          Follow up plan: Return in about 3 months (around 09/26/2019) for BP, DM.

## 2019-06-28 LAB — HEMOGLOBIN A1C
Est. average glucose Bld gHb Est-mCnc: 171 mg/dL
Hgb A1c MFr Bld: 7.6 % — ABNORMAL HIGH (ref 4.8–5.6)

## 2019-06-28 LAB — LIPID PANEL W/O CHOL/HDL RATIO
Cholesterol, Total: 116 mg/dL (ref 100–199)
HDL: 40 mg/dL (ref >39)
LDL Chol Calc (NIH): 64 mg/dL (ref 0–99)
Triglycerides: 52 mg/dL (ref 0–149)
VLDL Cholesterol Cal: 12 mg/dL (ref 5–40)

## 2019-06-28 LAB — COMPREHENSIVE METABOLIC PANEL
ALT: 19 IU/L (ref 0–44)
AST: 21 IU/L (ref 0–40)
Albumin/Globulin Ratio: 1.6 (ref 1.2–2.2)
Albumin: 4.1 g/dL (ref 3.7–4.7)
Alkaline Phosphatase: 107 IU/L (ref 39–117)
BUN/Creatinine Ratio: 11 (ref 10–24)
BUN: 17 mg/dL (ref 8–27)
Bilirubin Total: 0.4 mg/dL (ref 0.0–1.2)
CO2: 21 mmol/L (ref 20–29)
Calcium: 9 mg/dL (ref 8.6–10.2)
Chloride: 105 mmol/L (ref 96–106)
Creatinine, Ser: 1.5 mg/dL — ABNORMAL HIGH (ref 0.76–1.27)
GFR calc Af Amer: 52 mL/min/{1.73_m2} — ABNORMAL LOW (ref >59)
GFR calc non Af Amer: 45 mL/min/{1.73_m2} — ABNORMAL LOW (ref >59)
Globulin, Total: 2.6 g/dL (ref 1.5–4.5)
Glucose: 141 mg/dL — ABNORMAL HIGH (ref 65–99)
Potassium: 4.4 mmol/L (ref 3.5–5.2)
Sodium: 140 mmol/L (ref 134–144)
Total Protein: 6.7 g/dL (ref 6.0–8.5)

## 2019-06-28 IMAGING — US US EXTREM LOW DUPLEX ARTERIAL*R* LIMITED
1 series · 14 of 22 positions shown · non-contrast
Comparison: None.

CLINICAL DATA: 71-year-old male with right groin pain after
endovascular intervention 2 weeks ago

EXAM:
RIGHT LOWER EXTREMITY ARTERIAL DUPLEX SCAN
TECHNIQUE: Gray-scale sonography as well as color Doppler and duplex ultrasound
was performed to evaluate the lower extremity arteries including the
common, superficial and profunda femoral arteries, popliteal artery
and calf arteries.

[Series 1: us extrem low duplex arterial*right* limited · 0.09mm/px · 22 acquisitions, 14 frames shown]
[im 1/22]
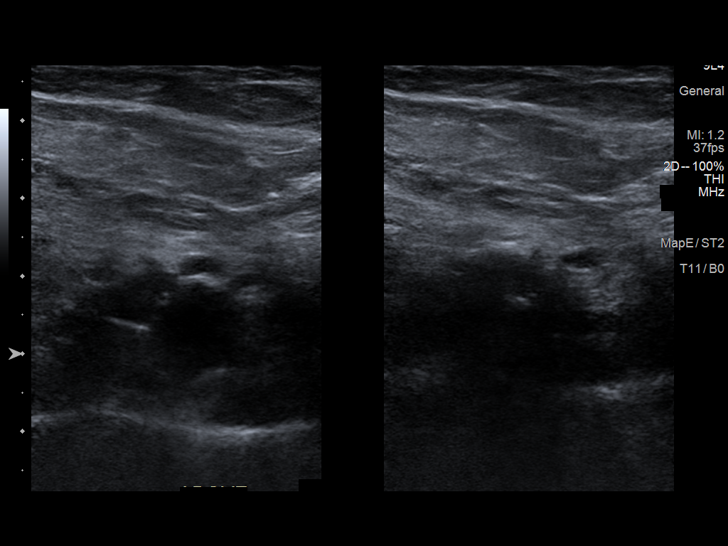
[im 3/22]
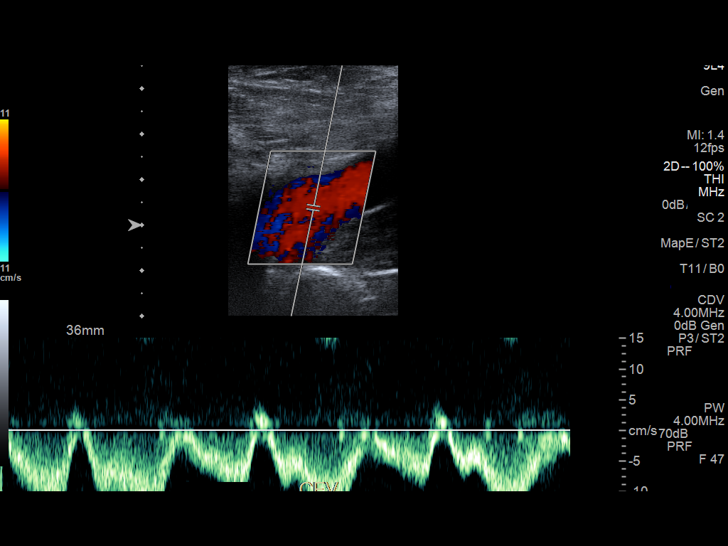
[im 4/22]
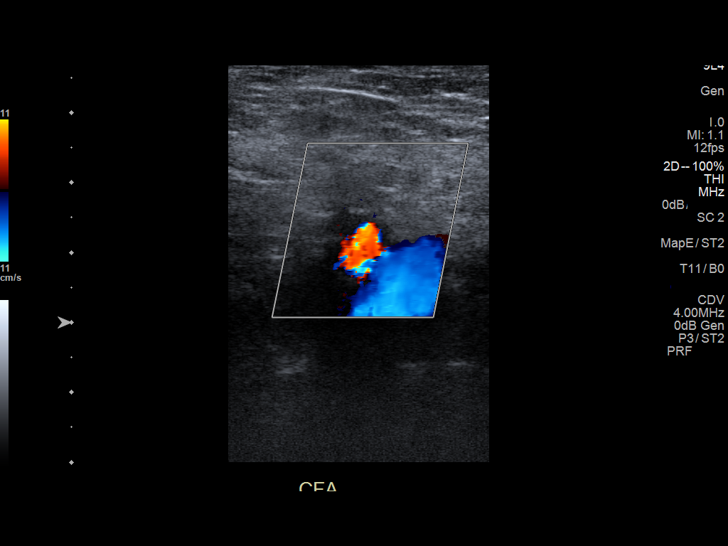
[im 6/22]
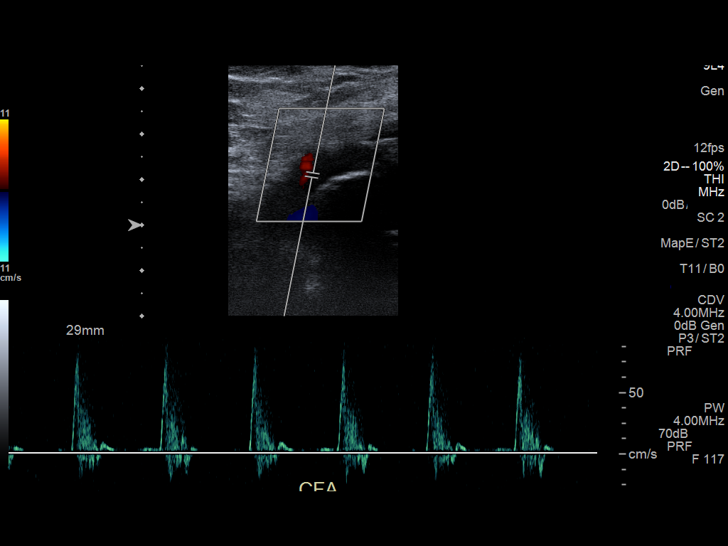
[im 8/22]
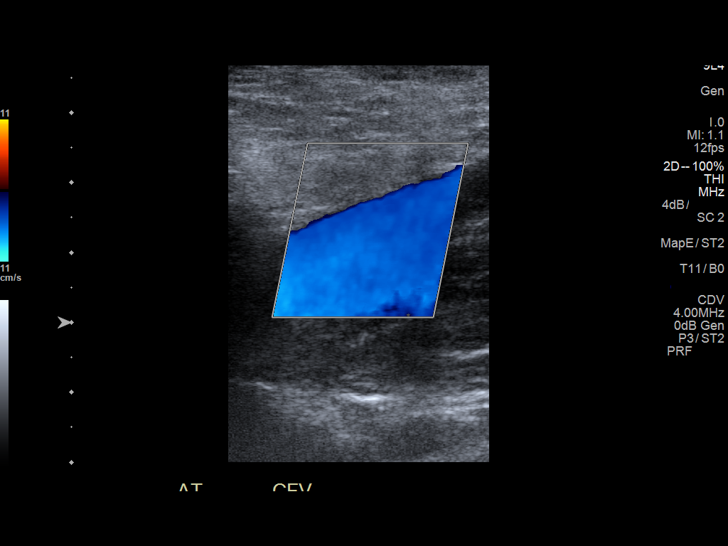
[im 9/22]
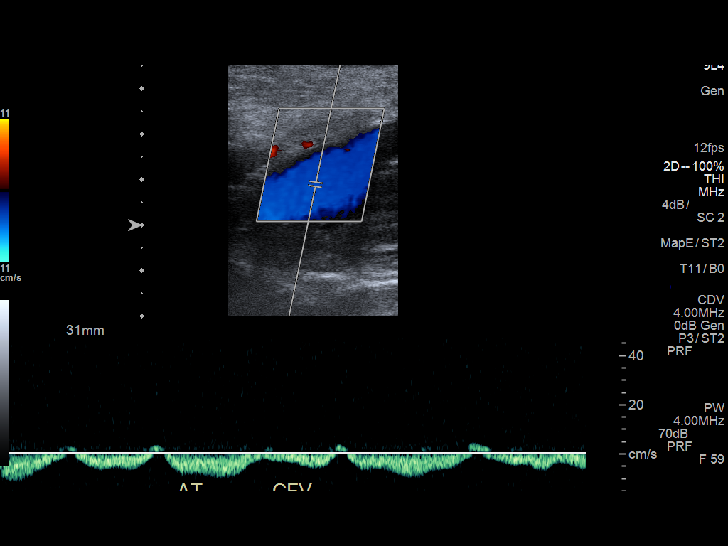
[im 11/22]
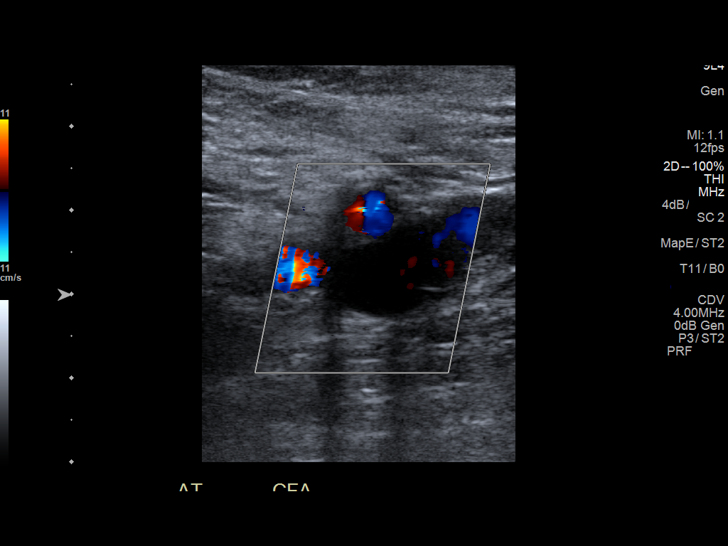
[im 12/22]
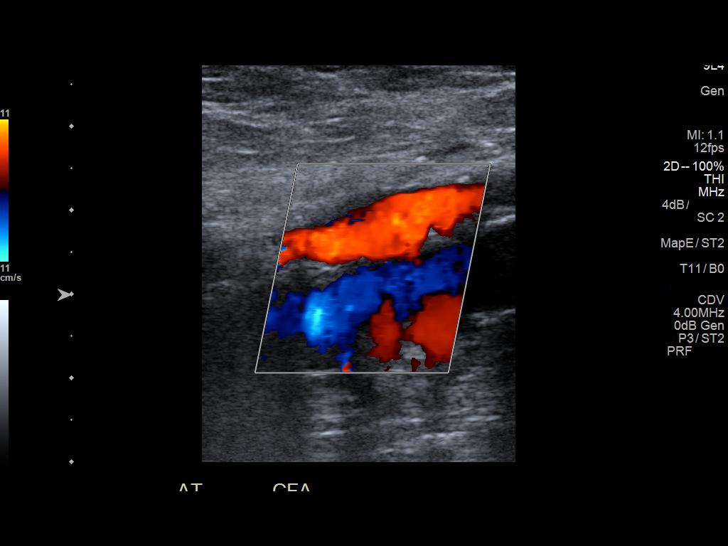
[im 14/22]
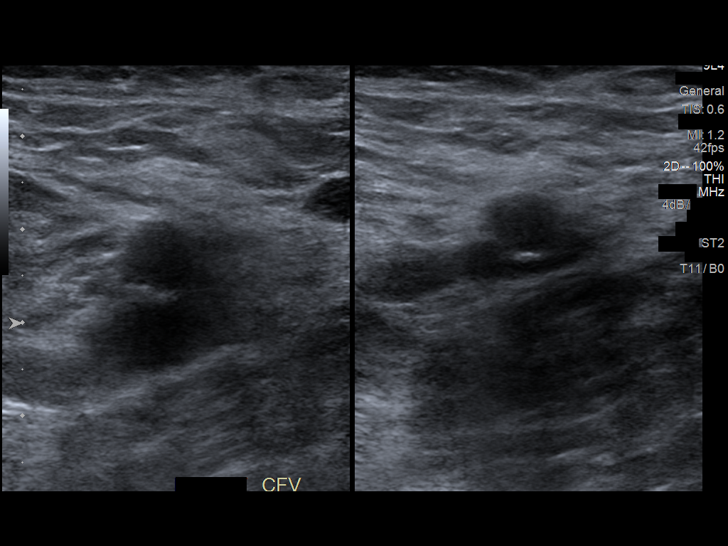
[im 15/22]
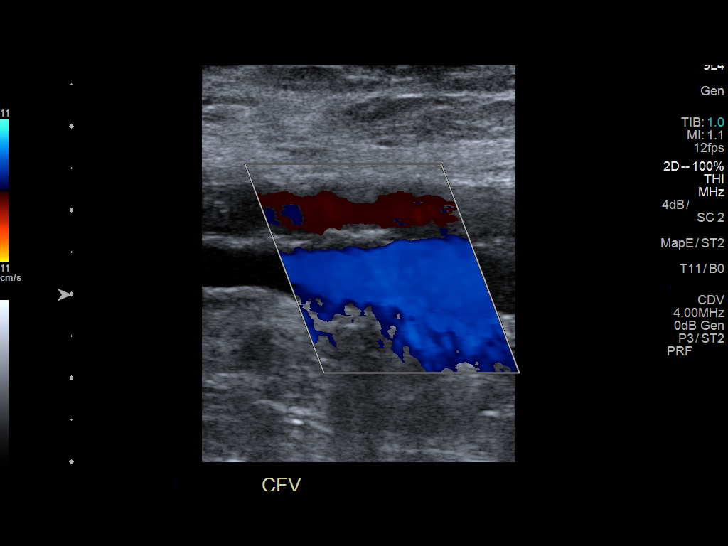
[im 17/22]
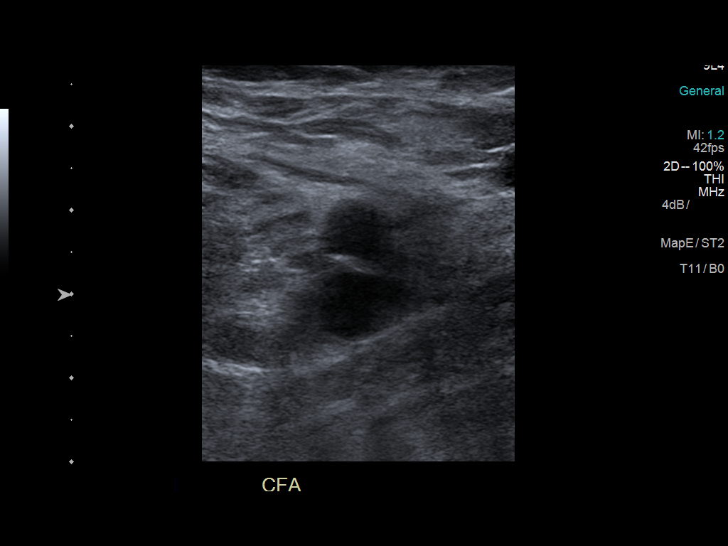
[im 19/22]
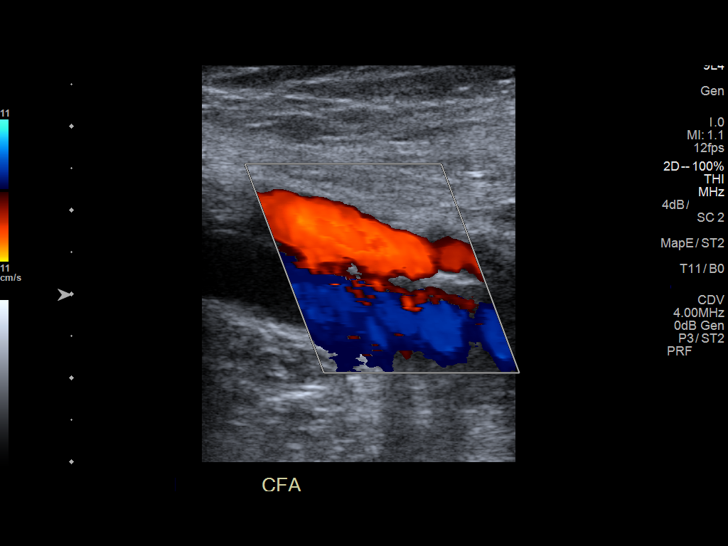
[im 20/22]
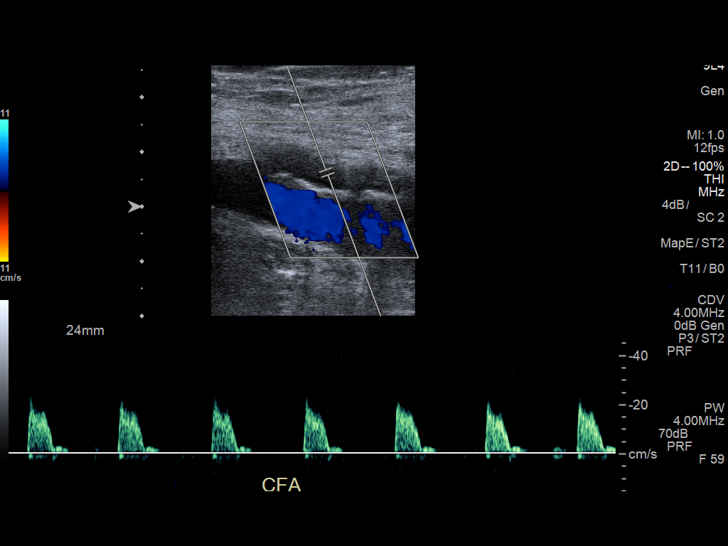
[im 22/22]
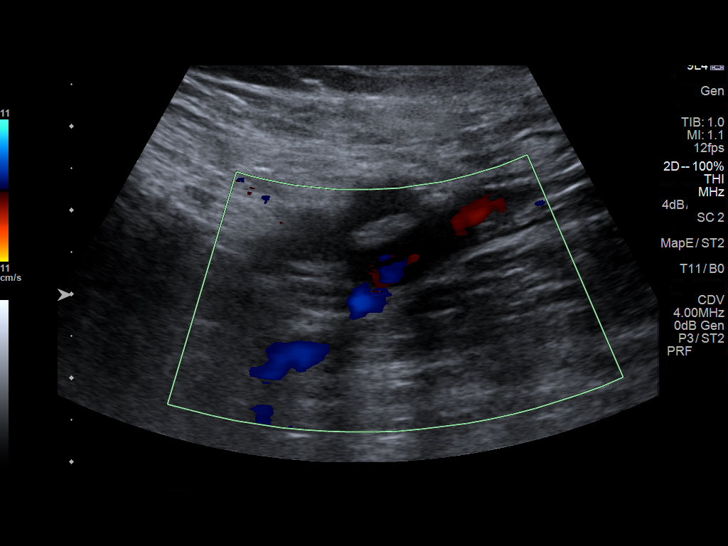

[14 of 22 positions shown; findings below may reference images not displayed]

FINDINGS: The right groin was interrogated with ultrasound including
grayscale, color Doppler and spectral Doppler. No evidence of
pseudoaneurysm. The common femoral artery is patent. The common
femoral vein is patent and compressible. No common femoral venous
DVT. No significant hematoma within the soft tissues.
IMPRESSION: Negative for evidence of hematoma, pseudoaneurysm or other post
intervention complication.

## 2019-06-29 ENCOUNTER — Other Ambulatory Visit: Payer: Self-pay | Admitting: Family Medicine

## 2019-06-29 MED ORDER — ALBUTEROL SULFATE (2.5 MG/3ML) 0.083% IN NEBU: 2.5000 mg | INHALATION_SOLUTION | Freq: Four times a day (QID) | RESPIRATORY_TRACT | 1 refills | Status: DC | PRN

## 2019-06-29 NOTE — Telephone Encounter (Signed)
Requested Prescriptions  Pending Prescriptions Disp Refills  . metoprolol tartrate (LOPRESSOR) 25 MG tablet 180 tablet 1    Sig: Take 1 tablet (25 mg total) by mouth 2 (two) times daily.     Cardiovascular:  Beta Blockers Failed - 06/29/2019  2:31 PM      Failed - Last BP in normal range    BP Readings from Last 1 Encounters:  06/27/19 (!) 147/67         Passed - Last Heart Rate in normal range    Pulse Readings from Last 1 Encounters:  06/27/19 62         Passed - Valid encounter within last 6 months    Recent Outpatient Visits          2 days ago Hypertension associated with diabetes [facility])   [facility] [name], [place]   3 months ago Colon cancer screening   [facility] [name], [place]   4 months ago Benign prostatic hyperplasia, unspecified whether lower urinary tract symptoms present   [facility], Salley Hews, [place]   1 year ago Benign essential HTN   [name] [name], [name], [place]   1 year ago CKD stage 3 due to type 2 diabetes mellitus [facility])   [name] [name], [name], [place]      Future Appointments            In 2 months Maurice March, Salley Hews, PA-C [facility], [facility]           . albuterol (PROVENTIL) (2.5 MG/3ML) 0.083% nebulizer solution 150 mL 1    Sig: Take 3 mLs (2.5 mg total) by nebulization every 6 (six) hours as needed for wheezing or shortness of breath.     Pulmonology:  Beta Agonists Failed - 06/29/2019  2:31 PM      Failed - One inhaler should last at least one month. If the patient is requesting refills earlier, contact the patient to check for uncontrolled symptoms.      Passed - Valid encounter within last 12 months    Recent Outpatient Visits          2 days ago Hypertension associated with diabetes [facility])   [facility] [name], [place]   3 months ago Colon cancer screening   [facility] Roosvelt Maser [place], [place]   4 months ago Benign prostatic hyperplasia, unspecified whether lower urinary tract symptoms present   [facility], Salley Hews, [place]   1 year ago Benign essential HTN   [name] [name], [name], [place]   1 year ago CKD stage 3 due to type 2 diabetes mellitus [facility])   [name] [name], [name], MD      Future Appointments            In 2 months Maurice March, Salley Hews, PA-C [facility], [facility]

## 2019-06-29 NOTE — Telephone Encounter (Signed)
Medication Refill - Medication:  metoprolol tartrate (LOPRESSOR) 25 MG tablet albuterol (PROVENTIL) (2.5 MG/3ML) 0.083%  Has the patient contacted their pharmacy? No. (Agent: If no, request that the patient contact the pharmacy for the refill.) (Agent: If yes, when and what did the pharmacy advise?)  Preferred Pharmacy (with phone number or street name): [facility]   Agent: Please be advised that RX refills may take up to 3 business days. We ask that you follow-up with your pharmacy.

## 2019-07-04 NOTE — Assessment & Plan Note (Signed)
Stable, continue to monitor lipids and working on lifestyle modifications

## 2019-07-04 NOTE — Assessment & Plan Note (Signed)
Recheck lipids, adjust as needed. Continue working on diet stability and improvements in diet for better control

## 2019-07-04 NOTE — Assessment & Plan Note (Signed)
Stable and well controlled, continue current regimen 

## 2019-07-04 NOTE — Assessment & Plan Note (Signed)
Stable and under good control, continue current regimen and recheck lipids

## 2019-07-04 NOTE — Assessment & Plan Note (Signed)
Still mildly above goal, increase hydralazine to 25 mg BID and continue remainder of regimen as is. Continue home monitoring, call with persistent abnormal readings

## 2019-07-11 ENCOUNTER — Encounter: Payer: Self-pay | Admitting: Family Medicine

## 2019-07-11 ENCOUNTER — Telehealth (INDEPENDENT_AMBULATORY_CARE_PROVIDER_SITE_OTHER): Payer: Medicare HMO | Admitting: Family Medicine

## 2019-07-11 VITALS — Wt 206.0 lb

## 2019-07-11 DIAGNOSIS — R05 Cough: Secondary | ICD-10-CM | POA: Diagnosis not present

## 2019-07-11 DIAGNOSIS — J449 Chronic obstructive pulmonary disease, unspecified: Secondary | ICD-10-CM | POA: Diagnosis not present

## 2019-07-11 DIAGNOSIS — R059 Cough, unspecified: Secondary | ICD-10-CM

## 2019-07-11 DIAGNOSIS — R0602 Shortness of breath: Secondary | ICD-10-CM

## 2019-07-11 MED ORDER — PREDNISONE 10 MG PO TABS: 10.0000 mg | ORAL_TABLET | Freq: Every day | ORAL | 0 refills | Status: DC

## 2019-07-11 MED ORDER — AZITHROMYCIN 250 MG PO TABS: ORAL_TABLET | ORAL | 0 refills | Status: DC

## 2019-07-11 NOTE — Progress Notes (Signed)
Wt 206 lb (93.4 kg)   BMI 33.25 kg/m    Subjective:    Patient ID: Benjamin Chan, male    DOB: [birth year], 74 y.o.   MRN: [id number]  HPI: Benjamin Chan is a 74 y.o. male  Chief Complaint  Patient presents with  . Cough    x the last 3 days  . Shortness of Breath  . Headache    . This visit was completed via telephone due to the restrictions of the COVID-19 pandemic. All issues as above were discussed and addressed. Physical exam was done as above through visual confirmation on telephone. If it was felt that the patient should be evaluated in the office, they were directed there. The patient verbally consented to this visit. . Location of the patient: home . Location of the provider: work . Those involved with this call:  . Provider: Merrie Roof, PA-C . CMA: Lesle Chris, [place] . Front Desk/Registration: [name]  . Time spent on call: 20 minutes on the phone discussing health concerns. 5 minutes total spent in review of patient's record and preparation of their chart. I verified patient identity using two factors (patient name and date of birth). Patient consents verbally to being seen via telemedicine visit today.   3 days of congestion, headache, body aches, productive cough, SOB progressively worsening. His wife, who helps provide today's history, has been letting him use her oxygen particularly at night as he's been very out of breath. Compliant with inhalers though doesn't feel like they are staying on top of sxs right now. No sick contacts, fever, chills, N/V/D. Hx of COPD with fairly frequent exacerbations.   Relevant past medical, surgical, family and social history reviewed and updated as indicated. Interim medical history since our last visit reviewed. Allergies and medications reviewed and updated.  Review of Systems  Per HPI unless specifically indicated above     Objective:    Wt 206 lb (93.4 kg)   BMI 33.25 kg/m   Wt Readings from Last 3  Encounters:  07/11/19 206 lb (93.4 kg)  06/27/19 218 lb (98.9 kg)  05/02/19 218 lb 0.6 oz (98.9 kg)    Physical Exam  PE unable to be performed today due to patient lack of access to video technology for today's visit  Results for orders placed or performed in visit on 06/27/19  Comprehensive metabolic panel  Result Value Ref Range   Glucose 141 (H) 65 - 99 mg/dL   BUN 17 8 - 27 mg/dL   Creatinine, Ser 1.50 (H) 0.76 - 1.27 mg/dL   GFR calc non Af Amer 45 (L) >59 mL/min/1.73   GFR calc Af Amer 52 (L) >59 mL/min/1.73   BUN/Creatinine Ratio 11 10 - 24   Sodium 140 134 - 144 mmol/L   Potassium 4.4 3.5 - 5.2 mmol/L   Chloride 105 96 - 106 mmol/L   CO2 21 20 - 29 mmol/L   Calcium 9.0 8.6 - 10.2 mg/dL   Total Protein 6.7 6.0 - 8.5 g/dL   Albumin 4.1 3.7 - 4.7 g/dL   Globulin, Total 2.6 1.5 - 4.5 g/dL   Albumin/Globulin Ratio 1.6 1.2 - 2.2   Bilirubin Total 0.4 0.0 - 1.2 mg/dL   Alkaline Phosphatase 107 39 - 117 IU/L   AST 21 0 - 40 IU/L   ALT 19 0 - 44 IU/L  Lipid Panel w/o Chol/HDL Ratio  Result Value Ref Range   Cholesterol, Total 116 100 - 199 mg/dL  Triglycerides 52 0 - 149 mg/dL   HDL 40 >85 mg/dL   VLDL Cholesterol Cal 12 5 - 40 mg/dL   LDL Chol Calc (NIH) 64 0 - 99 mg/dL  HgB U3J  Result Value Ref Range   Hgb A1c MFr Bld 7.6 (H) 4.8 - 5.6 %   Est. average glucose Bld gHb Est-mCnc 171 mg/dL      Assessment & Plan:   Problem List Items Addressed This Visit      Respiratory   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 classification (HCC) - Primary    Suspect COPD exacerbation causing his current illness, tx with zpak and prednisone in addition to continued inhaler regimen. Reviewed ER precautions with wife, who declines taking him at this point but will take him if he becomes any worse.       Relevant Medications   predniSONE (DELTASONE) 10 MG tablet   azithromycin (ZITHROMAX) 250 MG tablet    Other Visit Diagnoses    Cough       Unable to get to a COVID testing site but  agreeable to quarantine for 2 weeks unless needing to go to ER for worsening sxs. Tx for COPD exacerbation   Shortness of breath           Follow up plan: Return for as scheduled.

## 2019-07-12 NOTE — Assessment & Plan Note (Signed)
Suspect COPD exacerbation causing his current illness, tx with zpak and prednisone in addition to continued inhaler regimen. Reviewed ER precautions with wife, who declines taking him at this point but will take him if he becomes any worse.

## 2019-07-14 ENCOUNTER — Encounter: Payer: Self-pay | Admitting: Emergency Medicine

## 2019-07-14 ENCOUNTER — Encounter
Admission: EM | Disposition: A | Discharge status: Home / Self Care | Payer: Self-pay | Source: Home / Self Care | Attending: Internal Medicine

## 2019-07-14 ENCOUNTER — Emergency Department: Payer: Medicare HMO

## 2019-07-14 ENCOUNTER — Telehealth: Payer: Self-pay | Admitting: Family Medicine

## 2019-07-14 ENCOUNTER — Other Ambulatory Visit: Payer: Self-pay

## 2019-07-14 ENCOUNTER — Inpatient Hospital Stay
Admission: EM | Admit: 2019-07-14 | Discharge: 2019-07-16 | DRG: 281 | Disposition: A | Discharge status: Home / Self Care | Payer: Medicare HMO | Attending: Internal Medicine | Admitting: Internal Medicine

## 2019-07-14 DIAGNOSIS — N1831 Chronic kidney disease, stage 3a: Secondary | ICD-10-CM | POA: Diagnosis not present

## 2019-07-14 DIAGNOSIS — E1122 Type 2 diabetes mellitus with diabetic chronic kidney disease: Secondary | ICD-10-CM | POA: Diagnosis present

## 2019-07-14 DIAGNOSIS — Z79899 Other long term (current) drug therapy: Secondary | ICD-10-CM

## 2019-07-14 DIAGNOSIS — I2510 Atherosclerotic heart disease of native coronary artery without angina pectoris: Secondary | ICD-10-CM | POA: Diagnosis not present

## 2019-07-14 DIAGNOSIS — R7989 Other specified abnormal findings of blood chemistry: Secondary | ICD-10-CM | POA: Diagnosis not present

## 2019-07-14 DIAGNOSIS — E1140 Type 2 diabetes mellitus with diabetic neuropathy, unspecified: Secondary | ICD-10-CM | POA: Diagnosis not present

## 2019-07-14 DIAGNOSIS — Z955 Presence of coronary angioplasty implant and graft: Secondary | ICD-10-CM

## 2019-07-14 DIAGNOSIS — I213 ST elevation (STEMI) myocardial infarction of unspecified site: Secondary | ICD-10-CM | POA: Diagnosis present

## 2019-07-14 DIAGNOSIS — I214 Non-ST elevation (NSTEMI) myocardial infarction: Secondary | ICD-10-CM

## 2019-07-14 DIAGNOSIS — R778 Other specified abnormalities of plasma proteins: Secondary | ICD-10-CM

## 2019-07-14 DIAGNOSIS — Z87891 Personal history of nicotine dependence: Secondary | ICD-10-CM

## 2019-07-14 DIAGNOSIS — Z8679 Personal history of other diseases of the circulatory system: Secondary | ICD-10-CM | POA: Diagnosis not present

## 2019-07-14 DIAGNOSIS — M19019 Primary osteoarthritis, unspecified shoulder: Secondary | ICD-10-CM | POA: Diagnosis present

## 2019-07-14 DIAGNOSIS — I252 Old myocardial infarction: Secondary | ICD-10-CM | POA: Diagnosis not present

## 2019-07-14 DIAGNOSIS — Z888 Allergy status to other drugs, medicaments and biological substances status: Secondary | ICD-10-CM

## 2019-07-14 DIAGNOSIS — Z7982 Long term (current) use of aspirin: Secondary | ICD-10-CM

## 2019-07-14 DIAGNOSIS — I129 Hypertensive chronic kidney disease with stage 1 through stage 4 chronic kidney disease, or unspecified chronic kidney disease: Secondary | ICD-10-CM | POA: Diagnosis present

## 2019-07-14 DIAGNOSIS — D631 Anemia in chronic kidney disease: Secondary | ICD-10-CM | POA: Diagnosis not present

## 2019-07-14 DIAGNOSIS — E1110 Type 2 diabetes mellitus with ketoacidosis without coma: Secondary | ICD-10-CM

## 2019-07-14 DIAGNOSIS — G4700 Insomnia, unspecified: Secondary | ICD-10-CM | POA: Diagnosis present

## 2019-07-14 DIAGNOSIS — Z20822 Contact with and (suspected) exposure to covid-19: Secondary | ICD-10-CM | POA: Diagnosis present

## 2019-07-14 DIAGNOSIS — I700 Atherosclerosis of aorta: Secondary | ICD-10-CM | POA: Diagnosis not present

## 2019-07-14 DIAGNOSIS — J449 Chronic obstructive pulmonary disease, unspecified: Secondary | ICD-10-CM | POA: Diagnosis present

## 2019-07-14 DIAGNOSIS — Z794 Long term (current) use of insulin: Secondary | ICD-10-CM

## 2019-07-14 DIAGNOSIS — I152 Hypertension secondary to endocrine disorders: Secondary | ICD-10-CM

## 2019-07-14 DIAGNOSIS — R0602 Shortness of breath: Secondary | ICD-10-CM | POA: Diagnosis present

## 2019-07-14 DIAGNOSIS — Z8249 Family history of ischemic heart disease and other diseases of the circulatory system: Secondary | ICD-10-CM | POA: Diagnosis not present

## 2019-07-14 DIAGNOSIS — E785 Hyperlipidemia, unspecified: Secondary | ICD-10-CM | POA: Diagnosis present

## 2019-07-14 DIAGNOSIS — R0789 Other chest pain: Secondary | ICD-10-CM | POA: Diagnosis not present

## 2019-07-14 DIAGNOSIS — I25810 Atherosclerosis of coronary artery bypass graft(s) without angina pectoris: Secondary | ICD-10-CM | POA: Diagnosis not present

## 2019-07-14 DIAGNOSIS — N400 Enlarged prostate without lower urinary tract symptoms: Secondary | ICD-10-CM | POA: Diagnosis present

## 2019-07-14 DIAGNOSIS — E1159 Type 2 diabetes mellitus with other circulatory complications: Secondary | ICD-10-CM

## 2019-07-14 DIAGNOSIS — Q211 Atrial septal defect: Secondary | ICD-10-CM

## 2019-07-14 DIAGNOSIS — R05 Cough: Secondary | ICD-10-CM | POA: Diagnosis not present

## 2019-07-14 DIAGNOSIS — I10 Essential (primary) hypertension: Secondary | ICD-10-CM | POA: Diagnosis not present

## 2019-07-14 DIAGNOSIS — I249 Acute ischemic heart disease, unspecified: Secondary | ICD-10-CM | POA: Diagnosis not present

## 2019-07-14 DIAGNOSIS — I219 Acute myocardial infarction, unspecified: Secondary | ICD-10-CM | POA: Diagnosis not present

## 2019-07-14 DIAGNOSIS — R509 Fever, unspecified: Secondary | ICD-10-CM | POA: Diagnosis not present

## 2019-07-14 DIAGNOSIS — R9431 Abnormal electrocardiogram [ECG] [EKG]: Secondary | ICD-10-CM | POA: Diagnosis not present

## 2019-07-14 DIAGNOSIS — R079 Chest pain, unspecified: Secondary | ICD-10-CM

## 2019-07-14 HISTORY — PX: LEFT HEART CATH AND CORONARY ANGIOGRAPHY: CATH118249

## 2019-07-14 HISTORY — DX: Non-ST elevation (NSTEMI) myocardial infarction: I21.4

## 2019-07-14 LAB — PROTIME-INR
INR: 1.1 (ref 0.8–1.2)
Prothrombin Time: 13.7 seconds (ref 11.4–15.2)

## 2019-07-14 LAB — TROPONIN I (HIGH SENSITIVITY)
Troponin I (High Sensitivity): 9605 ng/L (ref <18)
Troponin I (High Sensitivity): 10170 ng/L (ref <18)
Troponin I (High Sensitivity): 9777 ng/L (ref <18)
Troponin I (High Sensitivity): 8473 ng/L (ref <18)

## 2019-07-14 LAB — BASIC METABOLIC PANEL
Anion gap: 10 (ref 5–15)
BUN: 22 mg/dL (ref 8–23)
CO2: 22 mmol/L (ref 22–32)
Calcium: 8.9 mg/dL (ref 8.9–10.3)
Chloride: 107 mmol/L (ref 98–111)
Creatinine, Ser: 1.46 mg/dL — ABNORMAL HIGH (ref 0.61–1.24)
GFR calc Af Amer: 54 mL/min — ABNORMAL LOW (ref >60)
GFR calc non Af Amer: 46 mL/min — ABNORMAL LOW (ref >60)
Glucose, Bld: 135 mg/dL — ABNORMAL HIGH (ref 70–99)
Potassium: 4.2 mmol/L (ref 3.5–5.1)
Sodium: 139 mmol/L (ref 135–145)

## 2019-07-14 LAB — RESPIRATORY PANEL BY RT PCR (FLU A&B, COVID)
Influenza A by PCR: NEGATIVE
Influenza B by PCR: NEGATIVE
SARS Coronavirus 2 by RT PCR: NEGATIVE

## 2019-07-14 LAB — CBC
HCT: 42.1 % (ref 39.0–52.0)
Hemoglobin: 14.2 g/dL (ref 13.0–17.0)
MCH: 29.5 pg (ref 26.0–34.0)
MCHC: 33.7 g/dL (ref 30.0–36.0)
MCV: 87.3 fL (ref 80.0–100.0)
Platelets: 289 10*3/uL (ref 150–400)
RBC: 4.82 MIL/uL (ref 4.22–5.81)
RDW: 12.6 % (ref 11.5–15.5)
WBC: 18.1 10*3/uL — ABNORMAL HIGH (ref 4.0–10.5)
nRBC: 0 % (ref 0.0–0.2)

## 2019-07-14 LAB — GLUCOSE, CAPILLARY
Glucose-Capillary: 130 mg/dL — ABNORMAL HIGH (ref 70–99)
Glucose-Capillary: 128 mg/dL — ABNORMAL HIGH (ref 70–99)

## 2019-07-14 SURGERY — LEFT HEART CATH AND CORONARY ANGIOGRAPHY
Anesthesia: Moderate Sedation

## 2019-07-14 MED ORDER — ACETAMINOPHEN 325 MG PO TABS
650.0000 mg | ORAL_TABLET | ORAL | Status: DC | PRN
  Administered 2019-07-15: 650 mg via ORAL

## 2019-07-14 MED ORDER — ASPIRIN 81 MG PO CHEW: 81.0000 mg | CHEWABLE_TABLET | ORAL | Status: DC

## 2019-07-14 MED ORDER — UMECLIDINIUM BROMIDE 62.5 MCG/INH IN AEPB
1.0000 | INHALATION_SPRAY | Freq: Every day | RESPIRATORY_TRACT | Status: DC
  Administered 2019-07-15 – 2019-07-16 (×2): 1 via RESPIRATORY_TRACT
  Filled 2019-07-14: qty 7

## 2019-07-14 MED ORDER — SODIUM CHLORIDE 0.9 % WEIGHT BASED INFUSION: 1.0000 mL/kg/h | INTRAVENOUS | Status: DC

## 2019-07-14 MED ORDER — ASPIRIN 300 MG RE SUPP: 300.0000 mg | RECTAL | Status: AC

## 2019-07-14 MED ORDER — ASPIRIN EC 325 MG PO TBEC
325.0000 mg | DELAYED_RELEASE_TABLET | Freq: Every day | ORAL | Status: DC
  Filled 2019-07-14: qty 1

## 2019-07-14 MED ORDER — IOHEXOL 300 MG/ML  SOLN
INTRAMUSCULAR | Status: DC | PRN
  Administered 2019-07-14: 120 mL

## 2019-07-14 MED ORDER — ONDANSETRON HCL 4 MG/2ML IJ SOLN: 4.0000 mg | Freq: Four times a day (QID) | INTRAMUSCULAR | Status: DC | PRN

## 2019-07-14 MED ORDER — ATORVASTATIN CALCIUM 80 MG PO TABS: 80.0000 mg | ORAL_TABLET | Freq: Every day | ORAL | Status: DC

## 2019-07-14 MED ORDER — SODIUM CHLORIDE 0.9% FLUSH: 3.0000 mL | INTRAVENOUS | Status: DC | PRN

## 2019-07-14 MED ORDER — MIDAZOLAM HCL 2 MG/2ML IJ SOLN
INTRAMUSCULAR | Status: DC | PRN
  Administered 2019-07-14: 1 mg via INTRAVENOUS

## 2019-07-14 MED ORDER — ASPIRIN 81 MG PO CHEW: 324.0000 mg | CHEWABLE_TABLET | ORAL | Status: AC

## 2019-07-14 MED ORDER — INSULIN GLARGINE 100 UNIT/ML ~~LOC~~ SOLN
10.0000 [IU] | Freq: Every day | SUBCUTANEOUS | Status: DC
  Administered 2019-07-14 – 2019-07-15 (×2): 10 [IU] via SUBCUTANEOUS
  Filled 2019-07-14 (×3): qty 0.1

## 2019-07-14 MED ORDER — LABETALOL HCL 5 MG/ML IV SOLN: 10.0000 mg | INTRAVENOUS | Status: AC | PRN

## 2019-07-14 MED ORDER — ASPIRIN EC 81 MG PO TBEC
81.0000 mg | DELAYED_RELEASE_TABLET | Freq: Every day | ORAL | Status: DC
  Administered 2019-07-15 – 2019-07-16 (×2): 81 mg via ORAL
  Filled 2019-07-14 (×2): qty 1

## 2019-07-14 MED ORDER — EZETIMIBE 10 MG PO TABS
10.0000 mg | ORAL_TABLET | Freq: Every day | ORAL | Status: DC
  Administered 2019-07-14 – 2019-07-16 (×3): 10 mg via ORAL
  Filled 2019-07-14 (×3): qty 1

## 2019-07-14 MED ORDER — NITROGLYCERIN 0.4 MG SL SUBL: 0.4000 mg | SUBLINGUAL_TABLET | SUBLINGUAL | Status: DC | PRN

## 2019-07-14 MED ORDER — ADULT MULTIVITAMIN W/MINERALS CH
1.0000 | ORAL_TABLET | Freq: Every day | ORAL | Status: DC
  Administered 2019-07-14 – 2019-07-16 (×3): 1 via ORAL
  Filled 2019-07-14 (×3): qty 1

## 2019-07-14 MED ORDER — HEPARIN (PORCINE) 25000 UT/250ML-% IV SOLN
1250.0000 [IU]/h | INTRAVENOUS | Status: DC
  Administered 2019-07-14: 1250 [IU]/h via INTRAVENOUS
  Filled 2019-07-14: qty 250

## 2019-07-14 MED ORDER — CLOPIDOGREL BISULFATE 75 MG PO TABS
75.0000 mg | ORAL_TABLET | Freq: Every day | ORAL | Status: DC
  Administered 2019-07-15 – 2019-07-16 (×2): 75 mg via ORAL
  Filled 2019-07-14 (×2): qty 1

## 2019-07-14 MED ORDER — ALBUTEROL SULFATE (2.5 MG/3ML) 0.083% IN NEBU: 2.5000 mg | INHALATION_SOLUTION | Freq: Four times a day (QID) | RESPIRATORY_TRACT | Status: DC | PRN

## 2019-07-14 MED ORDER — HEPARIN (PORCINE) IN NACL 1000-0.9 UT/500ML-% IV SOLN
INTRAVENOUS | Status: AC
  Filled 2019-07-14: qty 1000

## 2019-07-14 MED ORDER — MORPHINE SULFATE (PF) 4 MG/ML IV SOLN: 4.0000 mg | Freq: Once | INTRAVENOUS | Status: AC

## 2019-07-14 MED ORDER — ASPIRIN 81 MG PO CHEW
324.0000 mg | CHEWABLE_TABLET | Freq: Once | ORAL | Status: AC
  Administered 2019-07-14: 324 mg via ORAL
  Filled 2019-07-14: qty 4

## 2019-07-14 MED ORDER — HEPARIN BOLUS VIA INFUSION
4000.0000 [IU] | Freq: Once | INTRAVENOUS | Status: AC
  Administered 2019-07-14: 4000 [IU] via INTRAVENOUS
  Filled 2019-07-14: qty 4000

## 2019-07-14 MED ORDER — ACETAMINOPHEN 325 MG PO TABS: 650.0000 mg | ORAL_TABLET | Freq: Four times a day (QID) | ORAL | Status: DC | PRN

## 2019-07-14 MED ORDER — SODIUM CHLORIDE 0.9 % IV SOLN: 250.0000 mL | INTRAVENOUS | Status: DC | PRN

## 2019-07-14 MED ORDER — INSULIN PEN NEEDLE 31G X 5 MM MISC: 1.0000 [IU] | Freq: Every morning | Status: DC

## 2019-07-14 MED ORDER — FENTANYL CITRATE (PF) 100 MCG/2ML IJ SOLN
INTRAMUSCULAR | Status: AC
  Filled 2019-07-14: qty 2

## 2019-07-14 MED ORDER — FLUTICASONE FUROATE-VILANTEROL 100-25 MCG/INH IN AEPB
1.0000 | INHALATION_SPRAY | Freq: Every day | RESPIRATORY_TRACT | Status: DC
  Administered 2019-07-15 – 2019-07-16 (×2): 1 via RESPIRATORY_TRACT
  Filled 2019-07-14: qty 28

## 2019-07-14 MED ORDER — SODIUM CHLORIDE 0.9 % WEIGHT BASED INFUSION: 3.0000 mL/kg/h | INTRAVENOUS | Status: DC

## 2019-07-14 MED ORDER — HYDRALAZINE HCL 20 MG/ML IJ SOLN: 10.0000 mg | INTRAMUSCULAR | Status: AC | PRN

## 2019-07-14 MED ORDER — IOHEXOL 350 MG/ML SOLN
100.0000 mL | Freq: Once | INTRAVENOUS | Status: AC | PRN
  Administered 2019-07-14: 100 mL via INTRAVENOUS

## 2019-07-14 MED ORDER — ENALAPRIL MALEATE 20 MG PO TABS
20.0000 mg | ORAL_TABLET | Freq: Two times a day (BID) | ORAL | Status: DC
  Administered 2019-07-14 – 2019-07-16 (×4): 20 mg via ORAL
  Filled 2019-07-14 (×5): qty 1

## 2019-07-14 MED ORDER — FLUTICASONE-UMECLIDIN-VILANT 100-62.5-25 MCG/INH IN AEPB: 1.0000 | INHALATION_SPRAY | Freq: Every day | RESPIRATORY_TRACT | Status: DC

## 2019-07-14 MED ORDER — SODIUM CHLORIDE 0.9 % IV SOLN: INTRAVENOUS | Status: DC

## 2019-07-14 MED ORDER — FINASTERIDE 5 MG PO TABS
5.0000 mg | ORAL_TABLET | Freq: Every day | ORAL | Status: DC
  Administered 2019-07-14 – 2019-07-16 (×3): 5 mg via ORAL
  Filled 2019-07-14 (×3): qty 1

## 2019-07-14 MED ORDER — SODIUM CHLORIDE 0.9% FLUSH
3.0000 mL | Freq: Two times a day (BID) | INTRAVENOUS | Status: DC
  Administered 2019-07-15 – 2019-07-16 (×3): 3 mL via INTRAVENOUS

## 2019-07-14 MED ORDER — SODIUM CHLORIDE 0.9% FLUSH
3.0000 mL | Freq: Two times a day (BID) | INTRAVENOUS | Status: DC
  Administered 2019-07-14 – 2019-07-16 (×5): 3 mL via INTRAVENOUS

## 2019-07-14 MED ORDER — HEPARIN (PORCINE) IN NACL 1000-0.9 UT/500ML-% IV SOLN
INTRAVENOUS | Status: DC | PRN
  Administered 2019-07-14: 500 mL

## 2019-07-14 MED ORDER — MORPHINE SULFATE (PF) 4 MG/ML IV SOLN
INTRAVENOUS | Status: AC
  Administered 2019-07-14: 4 mg via INTRAVENOUS
  Filled 2019-07-14: qty 1

## 2019-07-14 MED ORDER — ATORVASTATIN CALCIUM 80 MG PO TABS
80.0000 mg | ORAL_TABLET | Freq: Every day | ORAL | Status: DC
  Administered 2019-07-15 – 2019-07-16 (×2): 80 mg via ORAL
  Filled 2019-07-14 (×2): qty 1

## 2019-07-14 MED ORDER — MULTIVITAMIN MEN 50+ PO TABS: 1.0000 | ORAL_TABLET | Freq: Every day | ORAL | Status: DC

## 2019-07-14 MED ORDER — ALBUTEROL SULFATE HFA 108 (90 BASE) MCG/ACT IN AERS: 2.0000 | INHALATION_SPRAY | Freq: Four times a day (QID) | RESPIRATORY_TRACT | Status: DC | PRN

## 2019-07-14 MED ORDER — ACETAMINOPHEN 325 MG PO TABS
650.0000 mg | ORAL_TABLET | ORAL | Status: DC | PRN
  Administered 2019-07-14 – 2019-07-16 (×4): 650 mg via ORAL
  Filled 2019-07-14 (×6): qty 2

## 2019-07-14 MED ORDER — METOPROLOL TARTRATE 25 MG PO TABS
25.0000 mg | ORAL_TABLET | Freq: Two times a day (BID) | ORAL | Status: DC
  Administered 2019-07-14 – 2019-07-16 (×4): 25 mg via ORAL
  Filled 2019-07-14 (×4): qty 1

## 2019-07-14 MED ORDER — FENTANYL CITRATE (PF) 100 MCG/2ML IJ SOLN
INTRAMUSCULAR | Status: DC | PRN
  Administered 2019-07-14: 25 ug via INTRAVENOUS

## 2019-07-14 MED ORDER — SODIUM CHLORIDE 0.9% FLUSH
3.0000 mL | Freq: Two times a day (BID) | INTRAVENOUS | Status: DC
  Administered 2019-07-14: 3 mL via INTRAVENOUS

## 2019-07-14 MED ORDER — MIDAZOLAM HCL 2 MG/2ML IJ SOLN
INTRAMUSCULAR | Status: AC
  Filled 2019-07-14: qty 2

## 2019-07-14 MED ORDER — ASPIRIN EC 81 MG PO TBEC: 81.0000 mg | DELAYED_RELEASE_TABLET | Freq: Every day | ORAL | Status: DC

## 2019-07-14 MED ORDER — SODIUM CHLORIDE 0.9 % WEIGHT BASED INFUSION
1.0000 mL/kg/h | INTRAVENOUS | Status: AC
  Administered 2019-07-14: 1 mL/kg/h via INTRAVENOUS

## 2019-07-14 SURGICAL SUPPLY — 10 items
CATH INFINITI 5 FR IM (CATHETERS) ×4 IMPLANT
CATH INFINITI 5FR ANG PIGTAIL (CATHETERS) ×2 IMPLANT
CATH INFINITI 5FR JL4 (CATHETERS) ×2 IMPLANT
CATH INFINITI JR4 5F (CATHETERS) ×2 IMPLANT
KIT MANI 3VAL PERCEP (MISCELLANEOUS) ×2 IMPLANT
NEEDLE PERC 18GX7CM (NEEDLE) ×2 IMPLANT
PACK CARDIAC CATH (CUSTOM PROCEDURE TRAY) ×2 IMPLANT
SHEATH AVANTI 5FR X 11CM (SHEATH) ×2 IMPLANT
WIRE EMERALD 3MM-J .035X260CM (WIRE) ×2 IMPLANT
WIRE GUIDERIGHT .035X150 (WIRE) ×2 IMPLANT

## 2019-07-14 NOTE — ED Triage Notes (Signed)
Pt from home via POV. Per pt reports congestion, fever (OTC tylenol at home this morning  4am), HA and dry cough for 3 days. feeling pain between shoulder blades. Pt denies CP at this time. Pt st SOB for "years" pt present with hx of COPD. NAD noted a this time.

## 2019-07-14 NOTE — ED Notes (Signed)
Pt to CT

## 2019-07-14 NOTE — Consult Note (Signed)
ANTICOAGULATION CONSULT NOTE - Initial Consult  Pharmacy Consult for Heparin Drip Indication: chest pain/ACS  Allergies  Allergen Reactions  . Brilinta [Ticagrelor] Shortness Of Breath    D/c by cardiology    Patient Measurements: Height: 5\' 8"  (172.7 cm) Weight: 98.9 kg (218 lb) IBW/kg (Calculated) : 68.4 Heparin Dosing Weight: 89.5kg  Vital Signs: Temp: 98 F (36.7 C) (04/29 1201) Temp Source: Oral (04/29 1201) BP: 147/73 (04/29 1201) Pulse Rate: 84 (04/29 1201)  Labs: Recent Labs    07/14/19 1206  HGB 14.2  HCT 42.1  PLT 289  CREATININE 1.46*  TROPONINIHS 9,605*    Estimated Creatinine Clearance: 49.8 mL/min (A) (by C-G formula based on SCr of 1.46 mg/dL (H)).   Medical History: Past Medical History:  Diagnosis Date  . Arthritis    shoulder  . BPH (benign prostatic hyperplasia)   . CAD (coronary artery disease)   . Chronic kidney disease   . COPD (chronic obstructive pulmonary disease) (HCC)   . Diabetes mellitus without complication (HCC)   . Hypertension   . MI (myocardial infarction) (HCC) 03/2017    Medications:  No PTA anticoagulation of record (only asa 81mg )  Assessment: 74 yo male with "pain between shoulder blades" and troponin of 9605.  Pharmacy has been consulted to initiate and monitor a heparin drip.   Goal of Therapy:  Heparin level 0.3-0.7 units/ml Monitor platelets by anticoagulation protocol: Yes   Plan:  Will obtain baseline APTT and INR.  Give 4000 units bolus x 1 Start heparin infusion at 1250 units/hr Check anti-Xa level in 8 hours and daily while on heparin Continue to monitor H&H and platelets   , PharmD, BCPS Clinical Pharmacist 07/14/2019 2:16 PM

## 2019-07-14 NOTE — Telephone Encounter (Signed)
Sounds like he would benefit from an appt to discuss current issues. In person at [facility] or ER if severely worse though

## 2019-07-14 NOTE — ED Notes (Signed)
Dr Derrill Kay notified of pts troponin [phone]

## 2019-07-14 NOTE — Progress Notes (Signed)
Patient admitted from cath lab. Per RN, bedrest until 9pm, HOB up at 8pm. Patient agreeable.   BP high as patient has not had any daily meds. Discussed with Dr. Marylu Lund and instructed to give BID metoprolol now, but skip enalapril and reassess before giving PRN hydralazine.

## 2019-07-14 NOTE — Telephone Encounter (Signed)
I am glad they went to the hospital. I hope he feels better soon.

## 2019-07-14 NOTE — H&P (Signed)
History and Physical    [name] JSE:[id number] DOB: 04-21-1945 DOA: 07/14/2019  PCP: [name], PA-C  Patient coming from: Home   Chief Complaint: Chest pain and shortness of breath  HPI: Benjamin Chan is a 74 y.o. male with medical history significant of CAD, status post CABG and PCI, hypertension, diabetes mellitus, chronic kidney disease presents complaining of couple of days of chest pressure with increased progressive shortness of breath with exertion.  He went to his primary care couple of days ago and was started on prednisone and Z-Pak.  However he did not improve and he came to the ER for further evaluation. ED Course: In the ER creatinine was found to be 1.46 which is his baseline, troponin elevating at 9600, WBC of 18.1. EKG revealing ischemic ST changes, with subtle ST elevations in inferior leads   Review of Systems: All systems reviewed and otherwise negative.    Past Medical History:  Diagnosis Date  . Arthritis    shoulder  . BPH (benign prostatic hyperplasia)   . CAD (coronary artery disease)   . Chronic kidney disease   . COPD (chronic obstructive pulmonary disease) (HCC)   . Diabetes mellitus without complication (HCC)   . Hypertension   . MI (myocardial infarction) (HCC) 03/2017    Past Surgical History:  Procedure Laterality Date  . COLONOSCOPY WITH PROPOFOL N/A 05/02/2019   Procedure: COLONOSCOPY WITH PROPOFOL;  Surgeon: Midge Minium, MD;  Location: [facility];  Service: Endoscopy;  Laterality: N/A;  Diabetic - insulin and oral meds  . CORONARY ARTERY BYPASS GRAFT  2006   2 vessel  . CORONARY STENT INTERVENTION N/A 03/30/2017   Procedure: CORONARY STENT INTERVENTION;  Surgeon: Alwyn Pea, MD;  Location: [facility];  Service: Cardiovascular;  Laterality: N/A;  . INGUINAL HERNIA REPAIR     right  . LEFT HEART CATH AND CORS/GRAFTS ANGIOGRAPHY N/A 03/30/2017   Procedure: LEFT HEART CATH AND CORS/GRAFTS  ANGIOGRAPHY;  Surgeon: Lamar Blinks, MD;  Location: [facility]VAS[facility] CV [facility];  Service: Cardiovascular;  Laterality: N/A;     reports that he quit smoking about 11 years ago. His smoking use included cigarettes. He quit after 40.00 years of use. He has never used smokeless tobacco. He reports that he does not drink alcohol or use drugs.  Allergies  Allergen Reactions  . Brilinta [Ticagrelor] Shortness Of Breath    D/c by cardiology    Family History  Problem Relation Age of Onset  . Hypertension Father   . Hypertension Mother   . Hypertension Sister   . Hypertension Brother   . Hypertension Brother   . Hypertension Brother   . Hypertension Brother   . Hypertension Brother   . Hypertension Sister   . Hypertension Sister   . Hypertension Sister   . Hypertension Sister   . Hypertension Sister      Prior to Admission medications   Medication Sig Start Date End Date Taking? Authorizing Provider  acetaminophen (TYLENOL) 325 MG tablet Take 650 mg by mouth every 6 (six) hours as needed.   Yes [provider]  albuterol (PROVENTIL) (2.5 MG/3ML) 0.083% nebulizer solution Take 3 mLs (2.5 mg total) by nebulization every 6 (six) hours as needed for wheezing or shortness of breath. 06/29/19  Yes [name], PA-C  albuterol (VENTOLIN HFA) 108 (90 Base) MCG/ACT inhaler Inhale 2 puffs into the lungs every 6 (six) hours as needed for wheezing or shortness of breath. 11/28/18  Yes Steele Sizer, MD  amLODipine (NORVASC) 10 MG tablet Take 1 tablet (10 mg total) by mouth daily. 11/28/18  Yes Steele Sizer, MD  aspirin EC 81 MG tablet Take 1 tablet (81 mg total) by mouth daily. 11/28/18  Yes [name], [name], MD  atorvastatin (LIPITOR) 80 MG tablet Take 1 tablet (80 mg total) by mouth daily. 11/28/18  Yes [name], [name], MD  doxazosin (CARDURA) 2 MG tablet Take 1 tablet (2 mg total) by mouth daily. 11/28/18  Yes [name], [name], MD  enalapril (VASOTEC) 20 MG tablet Take 1  tablet (20 mg total) by mouth 2 (two) times daily. 11/28/18  Yes [name], [name], MD  finasteride (PROSCAR) 5 MG tablet Take 1 tablet (5 mg total) by mouth daily. 11/28/18  Yes [name], [name], MD  fluticasone (FLONASE) 50 MCG/ACT nasal spray Place 1 spray into both nostrils 2 (two) times daily. Pt takes twice a day 11/28/18  Yes [name], [name], MD  Fluticasone-Umeclidin-Vilant (TRELEGY ELLIPTA) 100-62.5-25 MCG/INH AEPB Inhale 1 puff into the lungs daily. 11/28/18  Yes Steele Sizer, MD  glipiZIDE (GLUCOTROL) 5 MG tablet Take 1 tablet (5 mg total) by mouth daily before breakfast. 03/23/19  Yes [name], PA-C  hydrALAZINE (APRESOLINE) 25 MG tablet Take 1 tablet (25 mg total) by mouth in the morning and at bedtime. 06/27/19  Yes [name], PA-C  Insulin Glargine (BASAGLAR KWIKPEN) 100 UNIT/ML SOPN Inject 0.56 mLs (56 Units total) into the skin daily. 03/23/19  Yes [name], PA-C  metFORMIN (GLUCOPHAGE-XR) 500 MG 24 hr tablet Take 2 tablets (1,000 mg total) by mouth 2 (two) times daily. 11/28/18  Yes [name], [name], MD  metoprolol tartrate (LOPRESSOR) 25 MG tablet Take 1 tablet (25 mg total) by mouth 2 (two) times daily. 02/14/19  Yes [name], PA-C  nitroGLYCERIN (NITROSTAT) 0.4 MG SL tablet Place 1 tablet (0.4 mg total) under the tongue every 5 (five) minutes as needed for chest pain. 12/01/18  Yes Steele Sizer, MD  predniSONE (DELTASONE) 10 MG tablet Take 1 tablet (10 mg total) by mouth daily with breakfast. 07/11/19  Yes [name], [place]    Physical Exam: Vitals:   07/14/19 1330 07/14/19 1430 07/14/19 1500 07/14/19 1554  BP: (!) 168/92 (!) 152/68 (!) 145/67 (!) 169/76  Pulse: 87 91 91 99  Resp: (!) 21 (!) 31 (!) 26 20  Temp:    99 F (37.2 C)  TempSrc:    Oral  SpO2: 95% 95% 94% 95%  Weight:    127 kg  Height:    5\' 8"  (1.727 m)    Constitutional: NAD, calm, comfortable Vitals:   07/14/19 1330 07/14/19 1430 07/14/19  1500 07/14/19 1554  BP: (!) 168/92 (!) 152/68 (!) 145/67 (!) 169/76  Pulse: 87 91 91 99  Resp: (!) 21 (!) 31 (!) 26 20  Temp:    99 F (37.2 C)  TempSrc:    Oral  SpO2: 95% 95% 94% 95%  Weight:    127 kg  Height:    5\' 8"  (1.727 m)   Eyes: PERRL, lids and conjunctivae normal ENMT: Mucous membranes are moist.  Neck: normal, supple Respiratory: clear to auscultation bilaterally, no wheezing, no crackles. Normal respiratory effort. No accessory muscle use.  Cardiovascular: Regular rate and rhythm, no murmurs / rubs / gallops.  Mild bilateral extremity edema.  Abdomen: no tenderness, no masses palpated. Bowel sounds positive.  Musculoskeletal: no clubbing /  cyanosis. Skin: Warm dry Neurologic: CN 2-12 grossly intact.  Alert oriented x3  psychiatric: Normal judgment and insight.Normal mood.    Labs on Admission: I have personally reviewed following labs and imaging studies  CBC: Recent Labs  Lab 07/14/19 1206  WBC 18.1*  HGB 14.2  HCT 42.1  MCV 87.3  PLT 289   Basic Metabolic Panel: Recent Labs  Lab 07/14/19 1206  NA 139  K 4.2  CL 107  CO2 22  GLUCOSE 135*  BUN 22  CREATININE 1.46*  CALCIUM 8.9   GFR: Estimated Creatinine Clearance: 56.8 mL/min (A) (by C-G formula based on SCr of 1.46 mg/dL (H)). Liver Function Tests: No results for input(s): AST, ALT, ALKPHOS, BILITOT, PROT, ALBUMIN in the last 168 hours. No results for input(s): LIPASE, AMYLASE in the last 168 hours. No results for input(s): AMMONIA in the last 168 hours. Coagulation Profile: No results for input(s): INR, PROTIME in the last 168 hours. Cardiac Enzymes: No results for input(s): CKTOTAL, CKMB, CKMBINDEX, TROPONINI in the last 168 hours. BNP (last 3 results) No results for input(s): PROBNP in the last 8760 hours. HbA1C: No results for input(s): HGBA1C in the last 72 hours. CBG: No results for input(s): GLUCAP in the last 168 hours. Lipid Profile: No results for input(s): CHOL, HDL, LDLCALC,  TRIG, CHOLHDL, LDLDIRECT in the last 72 hours. Thyroid Function Tests: No results for input(s): TSH, T4TOTAL, FREET4, T3FREE, THYROIDAB in the last 72 hours. Anemia Panel: No results for input(s): VITAMINB12, FOLATE, FERRITIN, TIBC, IRON, RETICCTPCT in the last 72 hours. Urine analysis:    Component Value Date/Time   COLORURINE YELLOW (A) 04/24/2017 2000   APPEARANCEUR Turbid (A) 07/10/2017 0931   LABSPEC 1.016 04/24/2017 2000   PHURINE 5.0 04/24/2017 2000   GLUCOSEU Trace (A) 07/10/2017 0931   HGBUR SMALL (A) 04/24/2017 2000   BILIRUBINUR Negative 07/10/2017 0931   KETONESUR NEGATIVE 04/24/2017 2000   PROTEINUR 1+ (A) 07/10/2017 0931   PROTEINUR NEGATIVE 04/24/2017 2000   NITRITE Positive (A) 07/10/2017 0931   NITRITE NEGATIVE 04/24/2017 2000   LEUKOCYTESUR 2+ (A) 07/10/2017 0931    Radiological Exams on Admission: DG Chest Portable 1 View  Result Date: 07/14/2019 CLINICAL DATA:  Chest pain, cough EXAM: PORTABLE CHEST 1 VIEW COMPARISON:  03/27/2017 FINDINGS: Prior CABG. The heart size and mediastinal contours are within normal limits. Atherosclerotic calcification of the aortic knob. No focal airspace consolidation, pleural effusion, or pneumothorax. The visualized skeletal structures are unremarkable. IMPRESSION: No active disease. Electronically Signed   By: Duanne Guess D.O.   On: 07/14/2019 13:37   CT Angio Chest/Abd/Pel for Dissection W and/or Wo Contrast  Result Date: 07/14/2019 CLINICAL DATA:  Chest pain and left shoulder pain EXAM: CT ANGIOGRAPHY CHEST, ABDOMEN AND PELVIS TECHNIQUE: Non-contrast CT of the chest was initially obtained. Multidetector CT imaging through the chest, abdomen and pelvis was performed using the standard protocol during bolus administration of intravenous contrast. Multiplanar reconstructed images and MIPs were obtained and reviewed to evaluate the vascular anatomy. CONTRAST:  OMNIPAQUE IOHEXOL 350 MG/ML SOLN COMPARISON:  None. FINDINGS: CTA  CHEST FINDINGS Cardiovascular: Preferential opacification of the thoracic aorta. No evidence of thoracic aortic aneurysm or dissection. Atherosclerotic calcifications of the aorta and coronary arteries. Prior CABG and median sternotomy. Three vessel arch. Prominent atherosclerotic calcifications at the origin of the arch vessels without evidence of hemodynamically significant stenosis. Normal heart size. No pericardial effusion. Mediastinum/Nodes: No enlarged mediastinal, hilar, or axillary lymph nodes. Thyroid gland, trachea, and esophagus demonstrate  no significant findings. Lungs/Pleura: Biapical scarring with mild paraseptal emphysema. Right greater than left bibasilar atelectasis. No focal airspace consolidation, pleural effusion, or pneumothorax. Musculoskeletal: No chest wall abnormality. No acute or significant osseous findings. Review of the MIP images confirms the above findings. CTA ABDOMEN AND PELVIS FINDINGS VASCULAR Aorta: Extensive calcified and noncalcified atherosclerotic plaque. No aneurysm, dissection, or significant stenosis. Celiac: Calcified and noncalcified plaque at the celiac origin with approximately 50% stenosis. Celiac branch vessels are well opacified. No aneurysm or dissection. SMA: Atherosclerotic calcification without significant stenosis, aneurysm, or dissection. Renals: Atherosclerotic calcification without significant stenosis, aneurysm, or dissection. IMA: Patent. Inflow: Extensive calcified and noncalcified atherosclerotic plaque of the bilateral iliac arteries. Right common iliac artery measures 1.6 cm in diameter. Extensive calcified plaques at the bilateral common femoral arteries where there is at least 50% stenosis bilaterally. Veins: No obvious venous abnormality within the limitations of this arterial phase study. Review of the MIP images confirms the above findings. NON-VASCULAR Hepatobiliary: No focal liver abnormality is seen. No gallstones, gallbladder wall thickening,  or biliary dilatation. Pancreas: Unremarkable. No pancreatic ductal dilatation or surrounding inflammatory changes. Spleen: Normal arterial enhancement pattern of the spleen. Adrenals/Urinary Tract: Unremarkable adrenal glands. Kidneys enhance symmetrically. No focal renal lesion, stone, or hydronephrosis. Ureters and urinary bladder within normal limits. Stomach/Bowel: Stomach is within normal limits. Appendix appears normal. No evidence of bowel wall thickening, distention, or inflammatory changes. Lymphatic: No abdominopelvic lymphadenopathy. Reproductive: Prostate is unremarkable. Other: No abdominal wall hernia or abnormality. No abdominopelvic ascites. Musculoskeletal: No acute or significant osseous findings. Review of the MIP images confirms the above findings. IMPRESSION: 1. No evidence of thoracic aortic aneurysm or dissection. 2. No acute findings within the chest, abdomen, or pelvis. 3. Extensive atherosclerosis. 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 At least 50% stenosis at the bilateral common femoral arteries. 4. Aortic Atherosclerosis (ICD10-I70.0) and Emphysema (ICD10-J43.9). Electronically Signed   By: Davina Poke D.O.   On: 07/14/2019 14:13     Assessment/Plan   1. Acute MI- With EKG changes and elevated TP. Cards consulted, plan for cardiac cath today In ER started on heparin drip will continue Continue with aspirin, Lipitor, beta-blocker Serial troponins Echocardiogram Strict I's and O's Admit to telemetry on progressive care   2.  Diabetes mellitus-hemoglobin A1c on 4/12 was 7.6 Check fingersticks R ISS Start with Lantus 10 and increase close to home dose  3.h/xo CAD/cabg and PCI Continue asa, statin, acei, and beta blk  4. HTN- continue ace, beta blk. Hold hydralazine. Increase beta blk as tolerated, if not, start with hydralazine     DVT prophylaxis: heparin gtt  Code Status: full  Family Communication: daughter at bedside  Disposition Plan:  back to home   Consults called: cardiology  Admission status: inpatient as pt requires >2 [place] stays    Nolberto Hanlon MD [facility] Pager [phone]  If 7PM-7AM, please contact night-coverage [email or web address] Password [email or web address]  07/14/2019, 4:07 PM

## 2019-07-14 NOTE — Telephone Encounter (Signed)
Copied from CRM [id number]. Topic: General - Other >> Jul 14, 2019  8:41 AM Tamela Oddi wrote: Reason for CRM: Patient's daughter Joni Reining) is concerned because patient is still not feeling well.  Congested, not sleeping at night and feeling bad all over.  She stated that the doctor told him to call back if he was not feeling better after his visit with her earlier in the month.  Please advise and call daughter to see what should be done.  CB# [phone]

## 2019-07-14 NOTE — ED Provider Notes (Signed)
[facility] Emergency Department Provider Note  ____________________________________________   I have reviewed the triage vital signs and the nursing notes.   HISTORY  Chief Complaint Shortness of Breath (congestion)   History limited by: Not Limited   HPI Benjamin Chan is a 74 y.o. male who presents to the emergency department today because of concerns for some shortness of breath and congestion.  Patient also states he has had pain.  States the pain is located in his back between his shoulder blades.  The pain has slightly radiated to his chest.  The patient states he also felt like he had a fever. Does have history of MI but states that this pain does not remind him of his MI. He denies any pain during my initial exam.   Records reviewed. Per medical record review patient has a history of arthritis, COPD, CAD.   Past Medical History:  Diagnosis Date  . Arthritis    shoulder  . BPH (benign prostatic hyperplasia)   . CAD (coronary artery disease)   . Chronic kidney disease   . COPD (chronic obstructive pulmonary disease) (HCC)   . Diabetes mellitus without complication (HCC)   . Hypertension   . MI (myocardial infarction) (HCC) 03/2017    Patient Active Problem List   Diagnosis Date Noted  . Special screening for malignant neoplasms, colon   . CKD stage 3 due to type 2 diabetes mellitus (HCC) 11/23/2017  . Hyperlipidemia 07/10/2017  . Venous insufficiency of both lower extremities 06/10/2017  . Non-ST elevation myocardial infarction (NSTEMI), subendocardial infarction, subsequent episode of care (HCC) 04/14/2017  . 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 Benign essential HTN 04/14/2017  . 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 BPH (benign prostatic hyperplasia) 04/13/2017  . Benign hypertensive renal disease 04/13/2017  . DM (diabetes mellitus), type 2, uncontrolled, with  renal complications (HCC) 04/13/2017  . History of myocardial infarction 04/13/2017  . Diabetic neuropathy (HCC) 06/12/2016  . Hypertension associated with diabetes (HCC) 08/26/2012  . CAD (coronary artery disease) 08/26/2012  . ASD (atrial septal defect), ostium secundum 10/24/1997    Past Surgical History:  Procedure Laterality Date  . COLONOSCOPY WITH PROPOFOL N/A 05/02/2019   Procedure: COLONOSCOPY WITH PROPOFOL;  Surgeon: Midge Minium, MD;  Location: [facility];  Service: Endoscopy;  Laterality: N/A;  Diabetic - insulin and oral meds  . CORONARY ARTERY BYPASS GRAFT  2006   2 vessel  . CORONARY STENT INTERVENTION N/A 03/30/2017   Procedure: CORONARY STENT INTERVENTION;  Surgeon: Alwyn Pea, MD;  Location: [facility] INVASIVE CV LAB;  Service: Cardiovascular;  Laterality: N/A;  . INGUINAL HERNIA REPAIR     right  . LEFT HEART CATH AND CORS/GRAFTS ANGIOGRAPHY N/A 03/30/2017   Procedure: LEFT HEART CATH AND CORS/GRAFTS ANGIOGRAPHY;  Surgeon: Lamar Blinks, MD;  Location: [facility] INVASIVE CV LAB;  Service: Cardiovascular;  Laterality: N/A;    Prior to Admission medications   Medication Sig Start Date End Date Taking? Authorizing Provider  Accu-Chek FastClix Lancets MISC Use one lancet to check blood sugars between 1-4 times daily 12/01/18   Steele Sizer, MD  acetaminophen (TYLENOL) 325 MG tablet Take 650 mg by mouth every 6 (six) hours as needed.    [provider]  albuterol (PROVENTIL) (2.5 MG/3ML) 0.083% nebulizer solution Take 3 mLs (2.5 mg total) by nebulization every 6 (six) hours as needed for wheezing or shortness of breath. 06/29/19  [name], PA-C  albuterol (VENTOLIN HFA) 108 (90 Base) MCG/ACT inhaler Inhale 2 puffs into the lungs every 6 (six) hours as needed for wheezing or shortness of breath. 11/28/18   Steele Sizer, MD  amLODipine (NORVASC) 10 MG tablet Take 1 tablet (10 mg total) by mouth daily. 11/28/18   Steele Sizer, MD   aspirin EC 81 MG tablet Take 1 tablet (81 mg total) by mouth daily. 11/28/18   Steele Sizer, MD  atorvastatin (LIPITOR) 80 MG tablet Take 1 tablet (80 mg total) by mouth daily. 11/28/18   Steele Sizer, MD  azithromycin (ZITHROMAX) 250 MG tablet Take 2 tabs day one, then 1 tab daily until complete 07/11/19   [name], PA-C  doxazosin (CARDURA) 2 MG tablet Take 1 tablet (2 mg total) by mouth daily. 11/28/18   Steele Sizer, MD  empagliflozin (JARDIANCE) 25 MG TABS tablet Take 25 mg by mouth daily. 07/28/18   Steele Sizer, MD  enalapril (VASOTEC) 20 MG tablet Take 1 tablet (20 mg total) by mouth 2 (two) times daily. 11/28/18   Steele Sizer, MD  ezetimibe (ZETIA) 10 MG tablet Take by mouth. 03/22/19 03/21/20  [provider]  finasteride (PROSCAR) 5 MG tablet Take 1 tablet (5 mg total) by mouth daily. 11/28/18   Steele Sizer, MD  fluticasone (FLONASE) 50 MCG/ACT nasal spray Place 1 spray into both nostrils 2 (two) times daily. Pt takes twice a day 11/28/18   Steele Sizer, MD  Fluticasone-Umeclidin-Vilant (TRELEGY ELLIPTA) 100-62.5-25 MCG/INH AEPB Inhale 1 puff into the lungs daily. 11/28/18   Steele Sizer, MD  glipiZIDE (GLUCOTROL) 5 MG tablet Take 1 tablet (5 mg total) by mouth daily before breakfast. 03/23/19   [name], PA-C  glucose blood (ACCU-CHEK AVIVA) test strip Use as instructed 12/01/18   Steele Sizer, MD  hydrALAZINE (APRESOLINE) 25 MG tablet Take 1 tablet (25 mg total) by mouth in the morning and at bedtime. 06/27/19   [name], PA-C  Insulin Glargine (BASAGLAR KWIKPEN) 100 UNIT/ML SOPN Inject 0.56 mLs (56 Units total) into the skin daily. 03/23/19   [name], PA-C  Insulin Pen Needle (ADVOCATE INSULIN PEN NEEDLES) 31G X 5 MM MISC Inject 1 Units into the skin every morning. 12/01/18   Steele Sizer, MD  metFORMIN (GLUCOPHAGE-XR) 500 MG 24 hr tablet Take 2 tablets (1,000 mg total) by mouth 2 (two) times daily.  11/28/18   Steele Sizer, MD  metoprolol tartrate (LOPRESSOR) 25 MG tablet Take 1 tablet (25 mg total) by mouth 2 (two) times daily. 02/14/19   [name], PA-C  Multiple Vitamin (MULTIVITAMINS PO) Take by mouth daily. Gnp century mature multivitamin tab 125c    [provider]  nitroGLYCERIN (NITROSTAT) 0.4 MG SL tablet Place 1 tablet (0.4 mg total) under the tongue every 5 (five) minutes as needed for chest pain. 12/01/18   Steele Sizer, MD  predniSONE (DELTASONE) 10 MG tablet Take 1 tablet (10 mg total) by mouth daily with breakfast. 07/11/19   [name], PA-C    Allergies Brilinta [ticagrelor]  Family History  Problem Relation Age of Onset  . Hypertension Father   . Hypertension Mother   . Hypertension Sister   . Hypertension Brother   . Hypertension Brother   . Hypertension Brother   . Hypertension Brother   . Hypertension Brother   . Hypertension Sister   . Hypertension Sister   .  Hypertension Sister   . Hypertension Sister   . Hypertension Sister     Social History Social History   Tobacco Use  . Smoking status: Former Smoker    Years: 40.00    Types: Cigarettes    Quit date: 2010    Years since quitting: 11.3  . Smokeless tobacco: Never Used  Substance Use Topics  . Alcohol use: No  . Drug use: No    Review of Systems Constitutional: Positive for fever.  Eyes: No visual changes. ENT: No sore throat. Cardiovascular: Positive for chest discomfort. Respiratory: Positive for shortness of breath. Gastrointestinal: No abdominal pain.  No nausea, no vomiting.  No diarrhea.   Genitourinary: Negative for dysuria. Musculoskeletal: Positive for pain in his upper back. Skin: Negative for rash. Neurological: Negative for headaches, focal weakness or numbness.  ____________________________________________   PHYSICAL EXAM:  VITAL SIGNS: ED Triage Vitals  Enc Vitals Group     BP 07/14/19 1201 (!) 147/73     Pulse Rate  07/14/19 1201 84     Resp --      Temp 07/14/19 1201 98 F (36.7 C)     Temp Source 07/14/19 1201 Oral     SpO2 07/14/19 1201 98 %     Weight 07/14/19 1202 218 lb (98.9 kg)     Height 07/14/19 1202 5\' 8"  (1.727 m)     Head Circumference --      Peak Flow --      Pain Score 07/14/19 1201 10   Constitutional: Alert and oriented.  Eyes: Conjunctivae are normal.  ENT      Head: Normocephalic and atraumatic.      Nose: No congestion/rhinnorhea.      Mouth/Throat: Mucous membranes are moist.      Neck: No stridor. Hematological/Lymphatic/Immunilogical: No cervical lymphadenopathy. Cardiovascular: Normal rate, regular rhythm.  No murmurs, rubs, or gallops.  Respiratory: Normal respiratory effort without tachypnea nor retractions. Breath sounds are clear and equal bilaterally. No wheezes/rales/rhonchi. Gastrointestinal: Soft and non tender. No rebound. No guarding.  Genitourinary: Deferred Musculoskeletal: Normal range of motion in all extremities. No lower extremity edema. Neurologic:  Normal speech and language. No gross focal neurologic deficits are appreciated.  Skin:  Skin is warm, dry and intact. No rash noted. Psychiatric: Mood and affect are normal. Speech and behavior are normal. Patient exhibits appropriate insight and judgment.  ____________________________________________    LABS (pertinent positives/negatives)  Trop hs 9605 CBC wbc 18.1, hgb 14.2, plt 289 BMP na 139, k 4.2, glu 135, cr 1.46  ____________________________________________   EKG  I, 07/16/19, attending physician, personally viewed and interpreted this EKG  EKG Time: 1155 Rate: 84 Rhythm: normal sinus rhythm Axis: left axis deviation Intervals: qtc 434 QRS: narrow ST changes: no st elevation Impression: abnormal ekg  ____________________________________________    RADIOLOGY  CXR No acute abnormality  CT aorta dissection No  dissection  ____________________________________________   PROCEDURES  Procedures  CRITICAL CARE Performed by: Phineas Semen   Total critical care time: 40 minutes  Critical care time was exclusive of separately billable procedures and treating other patients.  Critical care was necessary to treat or prevent imminent or life-threatening deterioration.  Critical care was time spent personally by me on the following activities: development of treatment plan with patient and/or surrogate as well as nursing, discussions with consultants, evaluation of patient's response to treatment, examination of patient, obtaining history from patient or surrogate, ordering and performing treatments and interventions, ordering and review of laboratory studies,  ordering and review of radiographic studies, pulse oximetry and re-evaluation of patient's condition.  ____________________________________________   INITIAL IMPRESSION / ASSESSMENT AND PLAN / ED COURSE  Pertinent labs & imaging results that were available during my care of the patient were reviewed by me and considered in my medical decision making (see chart for details).   Patient presented to the emergency department today with concerns for some congestion, shortness of breath, back pain with slight chest discomfort.  Patient did have blood work drawn from triage.  Troponin came back significantly elevated.  Patient's EKG without any clear ST elevation.  Did repeat EKG multiple times.  Additionally discussed multiple times with Dr. Ubaldo Glassing with cardiology who evaluated the patient.  Did recommend aspirin and heparin.  The patient will be admitted to the hospitalist service.  Does sound like Dr. Ubaldo Glassing will plan on taking the patient to the catheterization lab later this afternoon.  ____________________________________________   FINAL CLINICAL IMPRESSION(S) / ED DIAGNOSES  Final diagnoses:  Chest pain, unspecified type  Elevated troponin      Note: This dictation was prepared with Dragon dictation. Any transcriptional errors that result from this process are unintentional     Nance Pear, MD 07/15/19 [phone]

## 2019-07-14 NOTE — Telephone Encounter (Signed)
Called pt to relay Rachel's message, pt's daughter answered the phone and said they are at the hospital and they are going to keep him for a couple of days

## 2019-07-14 NOTE — Consult Note (Signed)
CARDIOLOGY CONSULT NOTE               Patient ID: Benjamin Chan MRN: [id number] DOB/AGE: 1945/07/07 74 y.o.  Admit date: 07/14/2019 Referring Physician  Primary Physician [name] Primary Cardiologist Dr. Gwen Pounds  Reason for Consultation Chest pain, elevated troponin   HPI: Benjamin Chan is a 74 year old male with a past medical history significant for coronary artery disease s/p CABG with a LIMA to the LAD and SVG to OM2 in 2006 and PCI to the SVG graft in 2019, type 2 diabetes, COPD, hyperlipidemia, and hypertension who presented to the ED on 07/14/19 for a few day history of chest pain.  The pain started this past weekend on the right side of his chest, migrated to the left side, and has remained relatively constant since.  He admits to associated anorexia and insomnia. He was seen by his PCP on Monday who prescribed prednisone with no relief in symptoms.  Workup in the ED has been significant for high sensitivity troponin elevated x 2 at 9,605 followed by 10,170, ECG revealing borderline ST changes in the inferior leads, and WBC of 18.  He was evaluated in the ED and is undergoing a left heart catheterization with Dr. Lady Gary today, 07/14/19.    He is followed in outpatient cardiology by Dr. Gwen Pounds. Last echocardiogram through [facility] in October of 2019 revealed normal LV systolic function, an EF estimated between 55-60% with mild aortic valve calcification - no evidence of stenosis.  Review of systems complete and found to be negative unless listed above     Past Medical History:  Diagnosis Date  . Arthritis    shoulder  . BPH (benign prostatic hyperplasia)   . CAD (coronary artery disease)   . Chronic kidney disease   . COPD (chronic obstructive pulmonary disease) (HCC)   . Diabetes mellitus without complication (HCC)   . Hypertension   . MI (myocardial infarction) (HCC) 03/2017    Past Surgical History:  Procedure Laterality Date  . COLONOSCOPY WITH  PROPOFOL N/A 05/02/2019   Procedure: COLONOSCOPY WITH PROPOFOL;  Surgeon: Midge Minium, MD;  Location: [facility];  Service: Endoscopy;  Laterality: N/A;  Diabetic - insulin and oral meds  . CORONARY ARTERY BYPASS GRAFT  2006   2 vessel  . CORONARY STENT INTERVENTION N/A 03/30/2017   Procedure: CORONARY STENT INTERVENTION;  Surgeon: Alwyn Pea, MD;  Location: [facility] INVASIVE CV LAB;  Service: Cardiovascular;  Laterality: N/A;  . INGUINAL HERNIA REPAIR     right  . LEFT HEART CATH AND CORS/GRAFTS ANGIOGRAPHY N/A 03/30/2017   Procedure: LEFT HEART CATH AND CORS/GRAFTS ANGIOGRAPHY;  Surgeon: Lamar Blinks, MD;  Location: [facility] INVASIVE CV LAB;  Service: Cardiovascular;  Laterality: N/A;    (Not in a hospital admission)  Social History   Socioeconomic History  . Marital status: Married    Spouse name: Not on file  . Number of children: Not on file  . Years of education: Not on file  . Highest education level: 9th grade  Occupational History  . Occupation: retired  Tobacco Use  . Smoking status: Former Smoker    Years: 40.00    Types: Cigarettes    Quit date: 2010    Years since quitting: 11.3  . Smokeless tobacco: Never Used  Substance and Sexual Activity  . Alcohol use: No  . Drug use: No  . Sexual activity: Not on file  Other Topics Concern  . 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 Difficulty of Paying Living Expenses:   Food Insecurity:   . Worried About Programme researcher, broadcasting/film/video in the Last Year:   . Barista in the Last Year:   Transportation Needs:   . Freight forwarder (Medical):   [name] Lack of Transportation (Non-Medical):   Physical Activity:   . Days of Exercise per Week:   . Minutes of Exercise per Session:   Stress:   . Feeling of Stress :   Social Connections:   . Frequency of Communication with Friends and Family:     . Frequency of Social Gatherings with Friends and Family:   . Attends Religious Services:   . Active Member of Clubs or Organizations:   . Attends Banker Meetings:   [name] Marital Status:   Intimate Partner Violence:   . Fear of Current or Ex-Partner:   . Emotionally Abused:   [name] Physically Abused:   . Sexually Abused:     Family History  Problem Relation Age of Onset  . Hypertension Father   . Hypertension Mother   . Hypertension Sister   . Hypertension Brother   . Hypertension Brother   . Hypertension Brother   . Hypertension Brother   . Hypertension Brother   . Hypertension Sister   . Hypertension Sister   . Hypertension Sister   . Hypertension Sister   . Hypertension Sister       Review of systems complete and found to be negative unless listed above      PHYSICAL EXAM  General: Well developed, well nourished, in no acute distress HEENT:  Normocephalic and atramatic Neck:  No JVD.  Lungs: Clear bilaterally to auscultation and percussion. Heart: HRRR . Normal S1 and S2 without gallops or murmurs.  Abdomen: Bowel sounds are positive, abdomen soft and non-tender  Msk:  Back normal. Normal strength and tone for age. Extremities: No clubbing, cyanosis or edema.   Neuro: Alert and oriented X 3. Psych:  Good affect, responds appropriately  Labs:   Lab Results  Component Value Date   WBC 18.1 (H) 07/14/2019   HGB 14.2 07/14/2019   HCT 42.1 07/14/2019   MCV 87.3 07/14/2019   PLT 289 07/14/2019    Recent Labs  Lab 07/14/19 1206  NA 139  K 4.2  CL 107  CO2 22  BUN 22  CREATININE 1.46*  CALCIUM 8.9  GLUCOSE 135*   Lab Results  Component Value Date   CKTOTAL 605 (H) 06/29/2015   TROPONINI 1.09 (HH) 03/28/2017    Lab Results  Component Value Date   CHOL 116 06/27/2019   CHOL 179 03/23/2019   CHOL 184 07/10/2017   Lab Results  Component Value Date   HDL 40 06/27/2019   HDL 44 03/23/2019   HDL 36 (L) 07/10/2017   Lab Results   Component Value Date   LDLCALC 64 06/27/2019   LDLCALC 119 (H) 03/23/2019   LDLCALC 128 (H) 07/10/2017   Lab Results  Component Value Date   TRIG 52 06/27/2019   TRIG 86 03/23/2019   TRIG 100 07/10/2017   Lab Results  Component Value Date   CHOLHDL 4.6 03/29/2017   CHOLHDL 6.2 03/27/2017   No results found for: LDLDIRECT    Radiology: DG Chest Portable 1 View  Result Date: 07/14/2019  CLINICAL DATA:  Chest pain, cough EXAM: PORTABLE CHEST 1 VIEW COMPARISON:  03/27/2017 FINDINGS: Prior CABG. The heart size and mediastinal contours are within normal limits. Atherosclerotic calcification of the aortic knob. No focal airspace consolidation, pleural effusion, or pneumothorax. The visualized skeletal structures are unremarkable. IMPRESSION: No active disease. Electronically Signed   By: Davina Poke D.O.   On: 07/14/2019 13:37   CT Angio Chest/Abd/Pel for Dissection W and/or Wo Contrast  Result Date: 07/14/2019 CLINICAL DATA:  Chest pain and left shoulder pain EXAM: CT ANGIOGRAPHY CHEST, ABDOMEN AND PELVIS TECHNIQUE: Non-contrast CT of the chest was initially obtained. Multidetector CT imaging through the chest, abdomen and pelvis was performed using the standard protocol during bolus administration of intravenous contrast. Multiplanar reconstructed images and MIPs were obtained and reviewed to evaluate the vascular anatomy. CONTRAST:  161mL OMNIPAQUE IOHEXOL 350 MG/ML SOLN COMPARISON:  None. FINDINGS: CTA CHEST FINDINGS Cardiovascular: Preferential opacification of the thoracic aorta. No evidence of thoracic aortic aneurysm or dissection. Atherosclerotic calcifications of the aorta and coronary arteries. Prior CABG and median sternotomy. Three vessel arch. Prominent atherosclerotic calcifications at the origin of the arch vessels without evidence of hemodynamically significant stenosis. Normal heart size. No pericardial effusion. Mediastinum/Nodes: No enlarged mediastinal, hilar, or axillary  lymph nodes. Thyroid gland, trachea, and esophagus demonstrate no significant findings. Lungs/Pleura: Biapical scarring with mild paraseptal emphysema. Right greater than left bibasilar atelectasis. No focal airspace consolidation, pleural effusion, or pneumothorax. Musculoskeletal: No chest wall abnormality. No acute or significant osseous findings. Review of the MIP images confirms the above findings. CTA ABDOMEN AND PELVIS FINDINGS VASCULAR Aorta: Extensive calcified and noncalcified atherosclerotic plaque. No aneurysm, dissection, or significant stenosis. Celiac: Calcified and noncalcified plaque at the celiac origin with approximately 50% stenosis. Celiac branch vessels are well opacified. No aneurysm or dissection. SMA: Atherosclerotic calcification without significant stenosis, aneurysm, or dissection. Renals: Atherosclerotic calcification without significant stenosis, aneurysm, or dissection. IMA: Patent. Inflow: Extensive calcified and noncalcified atherosclerotic plaque of the bilateral iliac arteries. Right common iliac artery measures 1.6 cm in diameter. Extensive calcified plaques at the bilateral common femoral arteries where there is at least 50% stenosis bilaterally. Veins: No obvious venous abnormality within the limitations of this arterial phase study. Review of the MIP images confirms the above findings. NON-VASCULAR Hepatobiliary: No focal liver abnormality is seen. No gallstones, gallbladder wall thickening, or biliary dilatation. Pancreas: Unremarkable. No pancreatic ductal dilatation or surrounding inflammatory changes. Spleen: Normal arterial enhancement pattern of the spleen. Adrenals/Urinary Tract: Unremarkable adrenal glands. Kidneys enhance symmetrically. No focal renal lesion, stone, or hydronephrosis. Ureters and urinary bladder within normal limits. Stomach/Bowel: Stomach is within normal limits. Appendix appears normal. No evidence of bowel wall thickening, distention, or  inflammatory changes. Lymphatic: No abdominopelvic lymphadenopathy. Reproductive: Prostate is unremarkable. Other: No abdominal wall hernia or abnormality. No abdominopelvic ascites. Musculoskeletal: No acute or significant osseous findings. Review of the MIP images confirms the above findings. IMPRESSION: 1. No evidence of thoracic aortic aneurysm or dissection. 2. No acute findings within the chest, abdomen, or pelvis. 3. Extensive atherosclerosis. 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 At least 50% stenosis at the bilateral common femoral arteries. 4. Aortic Atherosclerosis (ICD10-I70.0) and Emphysema (ICD10-J43.9). Electronically Signed   By: Davina Poke D.O.   On: 07/14/2019 14:13    EKG: Sinus rhythm; borderline ST changes in the inferior leads  ASSESSMENT AND PLAN:  1.  Chest pain, elevated troponin   -With ongoing symptoms and elevated high sensitivity troponin x 2, will proceed with  left heart catheterization with Dr. Harold Hedge; benefits and risks of the procedure including bleeding, infection, MI, stroke, death all discussed with the patient who agrees to proceed   -Pending catheterization results, will likely assess with an echocardiogram to assess for myocarditis/pericarditis   The history, physical exam findings, and plan of care were all discussed with Dr. Harold Hedge, and all decision making was made in collaboration.   Signed: Andi Hence PA-C 07/14/2019, 2:38 PM

## 2019-07-15 ENCOUNTER — Encounter: Payer: Self-pay | Admitting: Cardiology

## 2019-07-15 ENCOUNTER — Inpatient Hospital Stay
Admit: 2019-07-15 | Discharge: 2019-07-15 | Disposition: A | Discharge status: Home / Self Care | Payer: Medicare HMO | Attending: Internal Medicine | Admitting: Internal Medicine

## 2019-07-15 DIAGNOSIS — N1831 Chronic kidney disease, stage 3a: Secondary | ICD-10-CM

## 2019-07-15 DIAGNOSIS — I214 Non-ST elevation (NSTEMI) myocardial infarction: Principal | ICD-10-CM

## 2019-07-15 DIAGNOSIS — D631 Anemia in chronic kidney disease: Secondary | ICD-10-CM

## 2019-07-15 DIAGNOSIS — R509 Fever, unspecified: Secondary | ICD-10-CM

## 2019-07-15 LAB — URINALYSIS, COMPLETE (UACMP) WITH MICROSCOPIC
Bacteria, UA: NONE SEEN
Bilirubin Urine: NEGATIVE
Glucose, UA: >=500 mg/dL — AB
Ketones, ur: NEGATIVE mg/dL
Leukocytes,Ua: NEGATIVE
Nitrite: NEGATIVE
Protein, ur: 30 mg/dL — AB
Specific Gravity, Urine: 1.023 (ref 1.005–1.030)
pH: 5 (ref 5.0–8.0)

## 2019-07-15 LAB — GLUCOSE, CAPILLARY
Glucose-Capillary: 157 mg/dL — ABNORMAL HIGH (ref 70–99)
Glucose-Capillary: 194 mg/dL — ABNORMAL HIGH (ref 70–99)
Glucose-Capillary: 172 mg/dL — ABNORMAL HIGH (ref 70–99)
Glucose-Capillary: 246 mg/dL — ABNORMAL HIGH (ref 70–99)

## 2019-07-15 LAB — LIPID PANEL
Cholesterol: 128 mg/dL (ref 0–200)
HDL: 50 mg/dL (ref >40)
LDL Cholesterol: 64 mg/dL (ref 0–99)
Total CHOL/HDL Ratio: 2.6 RATIO
Triglycerides: 68 mg/dL (ref <150)
VLDL: 14 mg/dL (ref 0–40)

## 2019-07-15 LAB — ECHOCARDIOGRAM COMPLETE
Height: 68 in
Weight: 3340.8 oz

## 2019-07-15 MED ORDER — SODIUM CHLORIDE 0.9 % IV SOLN: 1.0000 g | INTRAVENOUS | Status: DC

## 2019-07-15 NOTE — Progress Notes (Addendum)
PROGRESS NOTE    Benjamin Chan  UTM:[id number] DOB: [birth year] DOA: 07/14/2019 PCP: [name], PA-C    Brief Narrative:   Benjamin Chan is a 74 y.o. male with medical history significant of CAD, status post CABG and PCI, hypertension, diabetes mellitus, chronic kidney disease presents complaining of couple of days of chest pressure with increased progressive shortness of breath with exertion.  He went to his primary care couple of days ago and was started on prednisone and Z-Pak.  However he did not improve and he came to the ER for further evaluation.    Consultants:   cards  Procedures:  cath 4/29  Prox LAD lesion is 95% stenosed.  Ost Cx to Prox Cx lesion is 100% stenosed.  Mid RCA lesion is 60% stenosed.  Ost RPDA to RPDA lesion is 60% stenosed.  RPDA lesion is 50% stenosed.  Prox Graft lesion is 30% stenosed.  Prox Graft lesion is 100% stenosed.   LM patent LAD 95% proximal stenosis LCx occluded proximal RCA- 60% stenosis, RPDA 50% SVG to d2 patent LIMA to lad Patent SVG to om2 occluded.  Medical management. Not amenable to pci  Antimicrobials:      Subjective: No chest pain or shortness of breath this a.m.  Did have a chocolate chip cookie . tmax 100.1 Objective: Vitals:   07/15/19 0405 07/15/19 0426 07/15/19 0800 07/15/19 1144  BP:  137/75 134/72 127/71  Pulse:  91 83 69  Resp: (!) 26 20 16 17   Temp:  99.5 F (37.5 C) 98.3 F (36.8 C) 98.3 F (36.8 C)  TempSrc:  Oral    SpO2:  92% 95% 93%  Weight:  94.7 kg    Height:        Intake/Output Summary (Last 24 hours) at 07/15/2019 1555 Last data filed at 07/15/2019 1253 Gross per 24 hour  Intake -  Output 2050 ml  Net -2050 ml   Filed Weights   07/14/19 1202 07/14/19 1554 07/15/19 0426  Weight: 98.9 kg 127 kg 94.7 kg    Examination:  General exam: Appears calm and comfortable, NAD Respiratory system: Clear to auscultation. Respiratory effort normal.  Cardiovascular system: S1 & S2 heard, RRR. No JVD, murmurs, rubs, gallops or clicks.  Gastrointestinal system: Abdomen is nondistended, soft and nontender.  Normal bowel sounds heard. Central nervous system: Alert and oriented.  Grossly intact Extremities: No edema Skin: Dry Psychiatry: Judgement and insight appear normal. Mood & affect appropriate.     Data Reviewed: I have personally reviewed following labs and imaging studies  CBC: Recent Labs  Lab 07/14/19 1206  WBC 18.1*  HGB 14.2  HCT 42.1  MCV 87.3  PLT 289   Basic Metabolic Panel: Recent Labs  Lab 07/14/19 1206  NA 139  K 4.2  CL 107  CO2 22  GLUCOSE 135*  BUN 22  CREATININE 1.46*  CALCIUM 8.9   GFR: Estimated Creatinine Clearance: 48.8 mL/min (A) (by C-G formula based on SCr of 1.46 mg/dL (H)). Liver Function Tests: No results for input(s): AST, ALT, ALKPHOS, BILITOT, PROT, ALBUMIN in the last 168 hours. No results for input(s): LIPASE, AMYLASE in the last 168 hours. No results for input(s): AMMONIA in the last 168 hours. Coagulation Profile: Recent Labs  Lab 07/14/19 1810  INR 1.1   Cardiac Enzymes: No results for input(s): CKTOTAL, CKMB, CKMBINDEX, TROPONINI in the last 168 hours. BNP (last 3 results) No results for input(s): PROBNP in the last 8760 hours. HbA1C: No results  for input(s): HGBA1C in the last 72 hours. CBG: Recent Labs  Lab 07/14/19 1555 07/14/19 1714 07/15/19 0801 07/15/19 1145  GLUCAP 130* 128* 157* 194*   Lipid Profile: Recent Labs    07/15/19 0614  CHOL 128  HDL 50  LDLCALC 64  TRIG 68  CHOLHDL 2.6   Thyroid Function Tests: No results for input(s): TSH, T4TOTAL, FREET4, T3FREE, THYROIDAB in the last 72 hours. Anemia Panel: No results for input(s): VITAMINB12, FOLATE, FERRITIN, TIBC, IRON, RETICCTPCT in the last 72 hours. Sepsis Labs: No results for input(s): PROCALCITON, LATICACIDVEN in the last 168 hours.  Recent Results (from the past 240 hour(s))   Respiratory Panel by RT PCR (Flu A&B, Covid) - Nasopharyngeal Swab     Status: None   Collection Time: 07/14/19  2:19 PM   Specimen: Nasopharyngeal Swab  Result Value Ref Range Status   SARS Coronavirus 2 by RT PCR NEGATIVE NEGATIVE Final    Comment: (NOTE) SARS-CoV-2 target nucleic acids are NOT DETECTED. The SARS-CoV-2 RNA is generally detectable in upper respiratoy specimens during the acute phase of infection. The lowest concentration of SARS-CoV-2 viral copies this assay can detect is 131 copies/mL. A negative result does not preclude SARS-Cov-2 infection and should not be used as the sole basis for treatment or other patient management decisions. A negative result may occur with  improper specimen collection/handling, submission of specimen other than nasopharyngeal swab, presence of viral mutation(s) within the areas targeted by this assay, and inadequate number of viral copies (<131 copies/mL). A negative result must be combined with clinical observations, patient history, and epidemiological information. The expected result is Negative. Fact Sheet for Patients:  PinkCheek.be Fact Sheet for Healthcare Providers:  GravelBags.it This test is not yet ap proved or cleared by the [place] FDA and  has been authorized for detection and/or diagnosis of SARS-CoV-2 by FDA under an Emergency Use Authorization (EUA). This EUA will remain  in effect (meaning this test can be used) for the duration of the COVID-19 declaration under Section 564(b)(1) of the Act, 21 U.S.C. section 360bbb-3(b)(1), unless the authorization is terminated or revoked sooner.    Influenza A by PCR NEGATIVE NEGATIVE Final   Influenza B by PCR NEGATIVE NEGATIVE Final    Comment: (NOTE) The Xpert Xpress SARS-CoV-2/FLU/RSV assay is intended as an aid in  the diagnosis of influenza from Nasopharyngeal swab specimens and  should not be used as a sole  basis for treatment. Nasal washings and  aspirates are unacceptable for Xpert Xpress SARS-CoV-2/FLU/RSV  testing. Fact Sheet for Patients: PinkCheek.be Fact Sheet for Healthcare Providers: GravelBags.it This test is not yet approved or cleared by the [place] FDA and  has been authorized for detection and/or diagnosis of SARS-CoV-2 by  FDA under an Emergency Use Authorization (EUA). This EUA will remain  in effect (meaning this test can be used) for the duration of the  Covid-19 declaration under Section 564(b)(1) of the Act, 21  U.S.C. section 360bbb-3(b)(1), unless the authorization is  terminated or revoked. Performed at [facility], [address], [place], [place] [address]          Radiology Studies: CARDIAC CATHETERIZATION  Result Date: 07/14/2019  Prox LAD lesion is 95% stenosed.  Ost Cx to Prox Cx lesion is 100% stenosed.  Mid RCA lesion is 60% stenosed.  Ost RPDA to RPDA lesion is 60% stenosed.  RPDA lesion is 50% stenosed.  Prox Graft lesion is 30% stenosed.  Prox Graft lesion is 100% stenosed.  LM patent LAD 95% proximal stenosis LCx occluded proximal RCA- 60% stenosis, RPDA 50% SVG to d2 patent LIMA to lad Patent SVG to om2 occluded. Medical management. Not amenable to pci   DG Chest Portable 1 View  Result Date: 07/14/2019 CLINICAL DATA:  Chest pain, cough EXAM: PORTABLE CHEST 1 VIEW COMPARISON:  03/27/2017 FINDINGS: Prior CABG. The heart size and mediastinal contours are within normal limits. Atherosclerotic calcification of the aortic knob. No focal airspace consolidation, pleural effusion, or pneumothorax. The visualized skeletal structures are unremarkable. IMPRESSION: No active disease. Electronically Signed   By: Duanne Guess D.O.   On: 07/14/2019 13:37   ECHOCARDIOGRAM COMPLETE  Result Date: 07/15/2019    ECHOCARDIOGRAM REPORT   Patient Name:   DANYELL AWBREY Date of Exam:  07/15/2019 Medical Rec #:  [id number]          Height:       68.0 in Accession #:    [id number]         Weight:       208.8 lb Date of Birth:  Aug 25, 1945          BSA:          2.081 m Patient Age:    73 years           BP:           134/72 mmHg Patient Gender: M                  HR:           74 bpm. Exam Location:  [facility] Procedure: 2D Echo, Color Doppler and Cardiac Doppler Indications:     I21.4 NSTEMI  History:         Patient has prior history of Echocardiogram examinations.                  Previous Myocardial Infarction and CAD, COPD and CKD; Risk                  Factors:Hypertension and Diabetes.  Sonographer:     Humphrey Rolls RDCS (AE) Referring Phys:  [id number] [facility] Diagnosing Phys: Harold Hedge MD  Sonographer Comments: No parasternal window. Image acquisition challenging due to patient body habitus and Image acquisition challenging due to COPD. IMPRESSIONS  1. Left ventricular ejection fraction, by estimation, is 50 to 55%. The left ventricle has low normal function. The left ventricle has no regional wall motion abnormalities. The left ventricular internal cavity size was mildly dilated. Left ventricular diastolic parameters were normal.  2. Right ventricular systolic function is normal. The right ventricular size is normal. There is normal pulmonary artery systolic pressure.  3. The mitral valve was not well visualized. Trivial mitral valve regurgitation.  4. The aortic valve was not well visualized. Aortic valve regurgitation is not visualized. FINDINGS  Left Ventricle: Left ventricular ejection fraction, by estimation, is 50 to 55%. The left ventricle has low normal function. The left ventricle has no regional wall motion abnormalities. The left ventricular internal cavity size was mildly dilated. There is borderline left ventricular hypertrophy. Left ventricular diastolic parameters were normal. Right Ventricle: The right ventricular size is normal. No increase in right ventricular wall thickness.  Right ventricular systolic function is normal. There is normal pulmonary artery systolic pressure. The tricuspid regurgitant velocity is 2.51 m/s, and  with an assumed right atrial pressure of 10 mmHg, the estimated right ventricular systolic pressure is 35.2 mmHg. Left Atrium: Left atrial size was  normal in size. Right Atrium: Right atrial size was normal in size. Pericardium: There is no evidence of pericardial effusion. Mitral Valve: The mitral valve was not well visualized. Trivial mitral valve regurgitation. MV peak gradient, 4.2 mmHg. The mean mitral valve gradient is 2.0 mmHg. Tricuspid Valve: The tricuspid valve is not well visualized. Tricuspid valve regurgitation is trivial. Aortic Valve: The aortic valve was not well visualized. Aortic valve regurgitation is not visualized. Aortic valve mean gradient measures 4.0 mmHg. Aortic valve peak gradient measures 8.5 mmHg. Pulmonic Valve: The pulmonic valve was not well visualized. Pulmonic valve regurgitation is trivial. Aorta: The aortic root is normal in size and structure. IAS/Shunts: The interatrial septum was not assessed.  LEFT VENTRICLE PLAX 2D LVIDd:         4.25 cm Diastology LVIDs:         3.81 cm LV e' lateral:   7.94 cm/s LV PW:         1.28 cm LV E/e' lateral: 11.7 LV IVS:        1.00 cm LV e' medial:    5.22 cm/s                        LV E/e' medial:  17.7  RIGHT VENTRICLE RV Basal diam:  3.03 cm LEFT ATRIUM             Index       RIGHT ATRIUM           Index LA Vol (A2C):   36.0 ml 17.30 ml/m RA Area:     20.30 cm LA Vol (A4C):   58.7 ml 28.20 ml/m RA Volume:   60.60 ml  29.11 ml/m LA Biplane Vol: 46.0 ml 22.10 ml/m  AORTIC VALVE AV Vmax:           146.00 cm/s AV Vmean:          93.200 cm/s AV VTI:            0.231 m AV Peak Grad:      8.5 mmHg AV Mean Grad:      4.0 mmHg LVOT Vmax:         88.70 cm/s LVOT Vmean:        55.300 cm/s LVOT VTI:          0.178 m LVOT/AV VTI ratio: 0.77 MITRAL VALVE                TRICUSPID VALVE MV Area (PHT):  4.60 cm     TR Peak grad:   25.2 mmHg MV Peak grad:  4.2 mmHg     TR Vmax:        251.00 cm/s MV Mean grad:  2.0 mmHg MV Vmax:       1.02 m/s     SHUNTS MV Vmean:      64.1 cm/s    Systemic VTI: 0.18 m MV Decel Time: 165 msec MV E velocity: 92.60 cm/s MV A velocity: 104.00 cm/s MV E/A ratio:  0.89 Harold Hedge MD Electronically signed by Harold Hedge MD Signature Date/Time: 07/15/2019/12:56:40 PM    Final    CT Angio Chest/Abd/Pel for Dissection W and/or Wo Contrast  Result Date: 07/14/2019 CLINICAL DATA:  Chest pain and left shoulder pain EXAM: CT ANGIOGRAPHY CHEST, ABDOMEN AND PELVIS TECHNIQUE: Non-contrast CT of the chest was initially obtained. Multidetector CT imaging through the chest, abdomen and pelvis was performed using the standard protocol during bolus administration of intravenous  contrast. Multiplanar reconstructed images and MIPs were obtained and reviewed to evaluate the vascular anatomy. CONTRAST:  OMNIPAQUE IOHEXOL 350 MG/ML SOLN COMPARISON:  None. FINDINGS: CTA CHEST FINDINGS Cardiovascular: Preferential opacification of the thoracic aorta. No evidence of thoracic aortic aneurysm or dissection. Atherosclerotic calcifications of the aorta and coronary arteries. Prior CABG and median sternotomy. Three vessel arch. Prominent atherosclerotic calcifications at the origin of the arch vessels without evidence of hemodynamically significant stenosis. Normal heart size. No pericardial effusion. Mediastinum/Nodes: No enlarged mediastinal, hilar, or axillary lymph nodes. Thyroid gland, trachea, and esophagus demonstrate no significant findings. Lungs/Pleura: Biapical scarring with mild paraseptal emphysema. Right greater than left bibasilar atelectasis. No focal airspace consolidation, pleural effusion, or pneumothorax. Musculoskeletal: No chest wall abnormality. No acute or significant osseous findings. Review of the MIP images confirms the above findings. CTA ABDOMEN AND PELVIS FINDINGS VASCULAR  Aorta: Extensive calcified and noncalcified atherosclerotic plaque. No aneurysm, dissection, or significant stenosis. Celiac: Calcified and noncalcified plaque at the celiac origin with approximately 50% stenosis. Celiac branch vessels are well opacified. No aneurysm or dissection. SMA: Atherosclerotic calcification without significant stenosis, aneurysm, or dissection. Renals: Atherosclerotic calcification without significant stenosis, aneurysm, or dissection. IMA: Patent. Inflow: Extensive calcified and noncalcified atherosclerotic plaque of the bilateral iliac arteries. Right common iliac artery measures 1.6 cm in diameter. Extensive calcified plaques at the bilateral common femoral arteries where there is at least 50% stenosis bilaterally. Veins: No obvious venous abnormality within the limitations of this arterial phase study. Review of the MIP images confirms the above findings. NON-VASCULAR Hepatobiliary: No focal liver abnormality is seen. No gallstones, gallbladder wall thickening, or biliary dilatation. Pancreas: Unremarkable. No pancreatic ductal dilatation or surrounding inflammatory changes. Spleen: Normal arterial enhancement pattern of the spleen. Adrenals/Urinary Tract: Unremarkable adrenal glands. Kidneys enhance symmetrically. No focal renal lesion, stone, or hydronephrosis. Ureters and urinary bladder within normal limits. Stomach/Bowel: Stomach is within normal limits. Appendix appears normal. No evidence of bowel wall thickening, distention, or inflammatory changes. Lymphatic: No abdominopelvic lymphadenopathy. Reproductive: Prostate is unremarkable. Other: No abdominal wall hernia or abnormality. No abdominopelvic ascites. Musculoskeletal: No acute or significant osseous findings. Review of the MIP images confirms the above findings. IMPRESSION: 1. No evidence of thoracic aortic aneurysm or dissection. 2. No acute findings within the chest, abdomen, or pelvis. 3. Extensive atherosclerosis.  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 At least 50% stenosis at the bilateral common femoral arteries. 4. Aortic Atherosclerosis (ICD10-I70.0) and Emphysema (ICD10-J43.9). Electronically Signed   By: Duanne Guess D.O.   On: 07/14/2019 14:13        Scheduled Meds: . aspirin EC  325 mg Oral Daily  . aspirin EC  81 mg Oral Daily  . atorvastatin  80 mg Oral Daily  . clopidogrel  75 mg Oral Q breakfast  . enalapril  20 mg Oral BID  . ezetimibe  10 mg Oral Daily  . finasteride  5 mg Oral Daily  . fluticasone furoate-vilanterol  1 puff Inhalation Daily   And  . umeclidinium bromide  1 puff Inhalation Daily  . insulin glargine  10 Units Subcutaneous Daily  . metoprolol tartrate  25 mg Oral BID  . multivitamin with minerals  1 tablet Oral Daily  . sodium chloride flush  3 mL Intravenous Q12H  . sodium chloride flush  3 mL Intravenous Q12H  . sodium chloride flush  3 mL Intravenous Q12H   Continuous Infusions: . sodium chloride      Assessment & Plan:   Active  Problems:   NSTEMI (non-ST elevated myocardial infarction) (HCC)   1. Acute MI- With EKG changes and elevated TP. Cardiology following, status post cath did not require PCI Continue with aspirin, Lipitor, beta-blocker Plavix continue TP decreasing Echocardiogram results pending Strict I's and O's    2.  Diabetes mellitus-hemoglobin A1c on 4/12 was 7.6 Check fingersticks R ISS Start with Lantus 10 and increase close to home dose  3.h/xo CAD/cabg and PCI Continue asa, statin, acei, and beta blk  4. HTN- continue ace, beta blk. Hold hydralazine. Increase beta blk as tolerated, if not, start with hydralazine   5.CKD stage IIIa- stable at baseline  6.Fever- possibly from acs v.s. infection. Wbc elevated on admission Will ck UA, ucx, bcx  DVT prophylaxis: heparin gtt  Code Status: full  Family Communication: daughter at bedside  Disposition Plan: back to home  in am if no overnight chest  pain Barrier: with ACS. Needs overnight moniting to make sure cp free if stable d/c in am when cleared by cards. Needs to be fever free 24hrs.      LOS: 1 day   Time spent: with >50 % on coc    Lynn Ito, MD [facility] Pager [phone]  If 7PM-7AM, please contact night-coverage [email or web address] Password [facility] 07/15/2019, 3:55 PM

## 2019-07-15 NOTE — Progress Notes (Signed)
[facility]    SUBJECTIVE: Benjamin Chan is a 74 year old male with a past medical history significant for coronary artery disease s/p CABG with a LIMA to the LAD and SVG to OM2 in 2006 and PCI to the SVG graft in 2019, type 2 diabetes, COPD, hyperlipidemia, and hypertension who presented to the ED on 07/14/19 for a few day history of chest pain.  The pain started this past weekend on the right side of his chest, migrated to the left side, and has remained relatively constant since.  He admits to associated anorexia and insomnia. He was seen by his PCP on Monday who prescribed prednisone with no relief in symptoms.  Workup in the ED has been significant for high sensitivity troponin elevated x 2 at 9,605 followed by 10,170, ECG revealing borderline ST changes in the inferior leads, and WBC of 18.  He was evaluated in the ED and underwent left heart catheterization with Dr. Harold Hedge on 07/05/19.    Catheterization revealed a patent LM, patent LIMA to the LAD, patent SVG to D2, and an occluded SVG to OM2.  LAD had 95% proximal stenosis, left circumflex was chronically occluded, RCA had 60% stenosis and RPDA had 50% stenosis.  Recommendations were for medically management as lesions were not amenable to PCI.   Since the procedure, Benjamin Chan denies any recurrent chest pain or chest pressure.  He denies shortness of breath, palpitations, lower extremity swelling, orthopnea, PND, or syncopal/presyncopal episodes.  He denies pain or swelling from femoral access site.    Vitals:   07/15/19 0400 07/15/19 0405 07/15/19 0426 07/15/19 0800  BP:   137/75 134/72  Pulse:   91 83  Resp: (!) 28 (!) 26 20 16   Temp:   99.5 F (37.5 C) 98.3 F (36.8 C)  TempSrc:   Oral   SpO2:   92% 95%  Weight:   94.7 kg   Height:         Intake/Output Summary (Last 24 hours) at 07/15/2019 07/17/2019 Last data filed at 07/15/2019 0731 Gross per 24 hour  Intake --  Output 1700 ml  Net -1700 ml      PHYSICAL  EXAM  General: Well developed, well nourished, in no acute distress HEENT:  Normocephalic and atramatic Neck:  No JVD.  Lungs: Clear bilaterally to auscultation and percussion. Heart: HRRR . Normal S1 and S2 without gallops or murmurs.  Abdomen: Bowel sounds are positive, abdomen soft and non-tender  Msk:  Back normal, normal gait. Normal strength and tone for age. Extremities: No clubbing, cyanosis or edema.   Neuro: Alert and oriented X 3. Psych:  Good affect, responds appropriately   LABS: Basic Metabolic Panel: Recent Labs    07/14/19 1206  NA 139  K 4.2  CL 107  CO2 22  GLUCOSE 135*  BUN 22  CREATININE 1.46*  CALCIUM 8.9   Liver Function Tests: No results for input(s): AST, ALT, ALKPHOS, BILITOT, PROT, ALBUMIN in the last 72 hours. No results for input(s): LIPASE, AMYLASE in the last 72 hours. CBC: Recent Labs    07/14/19 1206  WBC 18.1*  HGB 14.2  HCT 42.1  MCV 87.3  PLT 289   Cardiac Enzymes: No results for input(s): CKTOTAL, CKMB, CKMBINDEX, TROPONINI in the last 72 hours. BNP: Invalid input(s): POCBNP D-Dimer: No results for input(s): DDIMER in the last 72 hours. Hemoglobin A1C: No results for input(s): HGBA1C in the last 72 hours. Fasting Lipid Panel: Recent Labs  07/15/19 0614  CHOL 128  HDL 50  LDLCALC 64  TRIG 68  CHOLHDL 2.6   Thyroid Function Tests: No results for input(s): TSH, T4TOTAL, T3FREE, THYROIDAB in the last 72 hours.  Invalid input(s): FREET3 Anemia Panel: No results for input(s): VITAMINB12, FOLATE, FERRITIN, TIBC, IRON, RETICCTPCT in the last 72 hours.  CARDIAC CATHETERIZATION  Result Date: 07/14/2019  Prox LAD lesion is 95% stenosed.  Ost Cx to Prox Cx lesion is 100% stenosed.  Mid RCA lesion is 60% stenosed.  Ost RPDA to RPDA lesion is 60% stenosed.  RPDA lesion is 50% stenosed.  Prox Graft lesion is 30% stenosed.  Prox Graft lesion is 100% stenosed.  LM patent LAD 95% proximal stenosis LCx occluded proximal  RCA- 60% stenosis, RPDA 50% SVG to d2 patent LIMA to lad Patent SVG to om2 occluded. Medical management. Not amenable to pci   DG Chest Portable 1 View  Result Date: 07/14/2019 CLINICAL DATA:  Chest pain, cough EXAM: PORTABLE CHEST 1 VIEW COMPARISON:  03/27/2017 FINDINGS: Prior CABG. The heart size and mediastinal contours are within normal limits. Atherosclerotic calcification of the aortic knob. No focal airspace consolidation, pleural effusion, or pneumothorax. The visualized skeletal structures are unremarkable. IMPRESSION: No active disease. Electronically Signed   By: Duanne Guess D.O.   On: 07/14/2019 13:37   CT Angio Chest/Abd/Pel for Dissection W and/or Wo Contrast  Result Date: 07/14/2019 CLINICAL DATA:  Chest pain and left shoulder pain EXAM: CT ANGIOGRAPHY CHEST, ABDOMEN AND PELVIS TECHNIQUE: Non-contrast CT of the chest was initially obtained. Multidetector CT imaging through the chest, abdomen and pelvis was performed using the standard protocol during bolus administration of intravenous contrast. Multiplanar reconstructed images and MIPs were obtained and reviewed to evaluate the vascular anatomy. CONTRAST:  OMNIPAQUE IOHEXOL 350 MG/ML SOLN COMPARISON:  None. FINDINGS: CTA CHEST FINDINGS Cardiovascular: Preferential opacification of the thoracic aorta. No evidence of thoracic aortic aneurysm or dissection. Atherosclerotic calcifications of the aorta and coronary arteries. Prior CABG and median sternotomy. Three vessel arch. Prominent atherosclerotic calcifications at the origin of the arch vessels without evidence of hemodynamically significant stenosis. Normal heart size. No pericardial effusion. Mediastinum/Nodes: No enlarged mediastinal, hilar, or axillary lymph nodes. Thyroid gland, trachea, and esophagus demonstrate no significant findings. Lungs/Pleura: Biapical scarring with mild paraseptal emphysema. Right greater than left bibasilar atelectasis. No focal airspace  consolidation, pleural effusion, or pneumothorax. Musculoskeletal: No chest wall abnormality. No acute or significant osseous findings. Review of the MIP images confirms the above findings. CTA ABDOMEN AND PELVIS FINDINGS VASCULAR Aorta: Extensive calcified and noncalcified atherosclerotic plaque. No aneurysm, dissection, or significant stenosis. Celiac: Calcified and noncalcified plaque at the celiac origin with approximately 50% stenosis. Celiac branch vessels are well opacified. No aneurysm or dissection. SMA: Atherosclerotic calcification without significant stenosis, aneurysm, or dissection. Renals: Atherosclerotic calcification without significant stenosis, aneurysm, or dissection. IMA: Patent. Inflow: Extensive calcified and noncalcified atherosclerotic plaque of the bilateral iliac arteries. Right common iliac artery measures 1.6 cm in diameter. Extensive calcified plaques at the bilateral common femoral arteries where there is at least 50% stenosis bilaterally. Veins: No obvious venous abnormality within the limitations of this arterial phase study. Review of the MIP images confirms the above findings. NON-VASCULAR Hepatobiliary: No focal liver abnormality is seen. No gallstones, gallbladder wall thickening, or biliary dilatation. Pancreas: Unremarkable. No pancreatic ductal dilatation or surrounding inflammatory changes. Spleen: Normal arterial enhancement pattern of the spleen. Adrenals/Urinary Tract: Unremarkable adrenal glands. Kidneys enhance symmetrically. No focal renal lesion, stone, or hydronephrosis.  Ureters and urinary bladder within normal limits. Stomach/Bowel: Stomach is within normal limits. Appendix appears normal. No evidence of bowel wall thickening, distention, or inflammatory changes. Lymphatic: No abdominopelvic lymphadenopathy. Reproductive: Prostate is unremarkable. Other: No abdominal wall hernia or abnormality. No abdominopelvic ascites. Musculoskeletal: No acute or significant  osseous findings. Review of the MIP images confirms the above findings. IMPRESSION: 1. No evidence of thoracic aortic aneurysm or dissection. 2. No acute findings within the chest, abdomen, or pelvis. 3. Extensive atherosclerosis. 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 At least 50% stenosis at the bilateral common femoral arteries. 4. Aortic Atherosclerosis (ICD10-I70.0) and Emphysema (ICD10-J43.9). Electronically Signed   By: Davina Poke D.O.   On: 07/14/2019 14:13     Echo: Pending   TELEMETRY: Normal sinus rhythm   ASSESSMENT AND PLAN:  Active Problems:   NSTEMI (non-ST elevated myocardial infarction) ([facility])    1.  NSTEMI  -Underwent left heart catheterization on 07/14/19 - not amenable to PCI   -Medical management recommended; continue aspirin 81mg  daily, Plavix 75mg  daily, atorvastatin 80mg  daily, Zetia 10mg  daily, metoprolol 25mg  BID, and enalapril 20mg  daily   -Echocardiogram pending   The history, physical exam findings, and plan of care were all discussed with Dr. Bartholome Bill, and all decision making was made in collaboration.   Avie Arenas  PA-C 07/15/2019 8:21 AM

## 2019-07-15 NOTE — Progress Notes (Signed)
*  PRELIMINARY RESULTS* Echocardiogram 2D Echocardiogram has been performed.  Benjamin Chan 07/15/2019, 10:40 AM

## 2019-07-15 NOTE — Progress Notes (Signed)
Patients has both ASA 81 and ASA 325 ordered. Clarified with Dr. Marylu Lund which dose patient is supposed to be on. Per md give patient the 81 mg dose.

## 2019-07-16 DIAGNOSIS — E1110 Type 2 diabetes mellitus with ketoacidosis without coma: Secondary | ICD-10-CM

## 2019-07-16 LAB — BASIC METABOLIC PANEL
Anion gap: 9 (ref 5–15)
BUN: 30 mg/dL — ABNORMAL HIGH (ref 8–23)
CO2: 23 mmol/L (ref 22–32)
Calcium: 8.4 mg/dL — ABNORMAL LOW (ref 8.9–10.3)
Chloride: 103 mmol/L (ref 98–111)
Creatinine, Ser: 1.45 mg/dL — ABNORMAL HIGH (ref 0.61–1.24)
GFR calc Af Amer: 54 mL/min — ABNORMAL LOW (ref >60)
GFR calc non Af Amer: 47 mL/min — ABNORMAL LOW (ref >60)
Glucose, Bld: 174 mg/dL — ABNORMAL HIGH (ref 70–99)
Potassium: 4.5 mmol/L (ref 3.5–5.1)
Sodium: 135 mmol/L (ref 135–145)

## 2019-07-16 LAB — CBC
HCT: 41.8 % (ref 39.0–52.0)
Hemoglobin: 14.1 g/dL (ref 13.0–17.0)
MCH: 29.3 pg (ref 26.0–34.0)
MCHC: 33.7 g/dL (ref 30.0–36.0)
MCV: 86.9 fL (ref 80.0–100.0)
Platelets: 294 10*3/uL (ref 150–400)
RBC: 4.81 MIL/uL (ref 4.22–5.81)
RDW: 12.5 % (ref 11.5–15.5)
WBC: 15.6 10*3/uL — ABNORMAL HIGH (ref 4.0–10.5)
nRBC: 0 % (ref 0.0–0.2)

## 2019-07-16 LAB — GLUCOSE, CAPILLARY: Glucose-Capillary: 167 mg/dL — ABNORMAL HIGH (ref 70–99)

## 2019-07-16 MED ORDER — CLOPIDOGREL BISULFATE 75 MG PO TABS: 75.0000 mg | ORAL_TABLET | Freq: Every day | ORAL | 0 refills | Status: DC

## 2019-07-16 MED ORDER — AMLODIPINE BESYLATE 10 MG PO TABS: 5.0000 mg | ORAL_TABLET | Freq: Every day | ORAL | 0 refills | Status: DC

## 2019-07-16 MED ORDER — CARVEDILOL 12.5 MG PO TABS: 12.5000 mg | ORAL_TABLET | Freq: Two times a day (BID) | ORAL | Status: DC

## 2019-07-16 MED ORDER — CARVEDILOL 25 MG PO TABS: 25.0000 mg | ORAL_TABLET | Freq: Two times a day (BID) | ORAL | Status: DC

## 2019-07-16 MED ORDER — EZETIMIBE 10 MG PO TABS: 10.0000 mg | ORAL_TABLET | Freq: Every day | ORAL | 0 refills | Status: DC

## 2019-07-16 NOTE — Progress Notes (Signed)
Patient Name: Benjamin Chan Date of Encounter: 07/16/2019  Hospital Problem List     Active Problems:   NSTEMI (non-ST elevated myocardial infarction) [facility])    Patient Profile     74 year old male with a past medical history significant for coronary artery disease s/p CABG with a LIMA to the LAD and SVG to OM2 in 2006 and PCI to the SVG graft in 2019, type 2 diabetes, COPD, hyperlipidemia, and hypertension who presented to the ED on 4/29/21for a few day history of chest pain. The pain started this past weekend on the right side of his chest, migrated to the left side, and has remained relatively constant since. He admits to associated anorexia and insomnia. He was seen by his PCP on Monday who prescribed prednisone with no relief in symptoms. Workup in the ED has been significant for high sensitivity troponin elevated x 2 at 9,605 followed by 10,170, ECG revealing borderline ST changes in the inferior leads, and WBC of 18. He was evaluated in the ED and underwent left heart catheterization with Dr. Bartholome Bill on 07/05/19.    Catheterization revealed a patent LM, patent LIMA to the LAD, patent SVG to D2, and an occluded SVG to OM2.  LAD had 95% proximal stenosis, left circumflex was chronically occluded, RCA had 60% stenosis and RPDA had 50% stenosis.  Recommendations were for medically management as lesions were not amenable to PCI.   Since the procedure, Benjamin Chan denies any recurrent chest pain or chest pressure.  He denies shortness of breath, palpitations, lower extremity swelling, orthopnea, PND, or syncopal/presyncopal episodes.  He denies pain or swelling from femoral access site.   Subjective   Doing well this AM.  No pain in cath site.  No chest pain.  Complains of cold symptoms.  Inpatient Medications    . aspirin EC  325 mg Oral Daily  . aspirin EC  81 mg Oral Daily  . atorvastatin  80 mg Oral Daily  . carvedilol  25 mg Oral BID WC  . clopidogrel  75 mg Oral  Q breakfast  . enalapril  20 mg Oral BID  . ezetimibe  10 mg Oral Daily  . finasteride  5 mg Oral Daily  . fluticasone furoate-vilanterol  1 puff Inhalation Daily   And  . umeclidinium bromide  1 puff Inhalation Daily  . insulin glargine  10 Units Subcutaneous Daily  . multivitamin with minerals  1 tablet Oral Daily  . sodium chloride flush  3 mL Intravenous Q12H  . sodium chloride flush  3 mL Intravenous Q12H    Vital Signs    Vitals:   07/15/19 2224 07/15/19 2227 07/16/19 0405 07/16/19 0815  BP: (!) 159/73 (!) 159/73 (!) 141/67 (!) 156/70  Pulse:  88 76 95  Resp:   20   Temp:   98.7 F (37.1 C) 98.6 F (37 C)  TempSrc:   Oral   SpO2:   98% 95%  Weight:   93.6 kg   Height:        Intake/Output Summary (Last 24 hours) at 07/16/2019 0909 Last data filed at 07/16/2019 0718 Gross per 24 hour  Intake --  Output 1750 ml  Net -1750 ml   Filed Weights   07/14/19 1554 07/15/19 0426 07/16/19 0405  Weight: 127 kg 94.7 kg 93.6 kg    Physical Exam    GEN: Well nourished, well developed, in no acute distress.  HEENT: normal.  Neck: Supple, no JVD, carotid  bruits, or masses. Cardiac: RRR, no murmurs, rubs, or gallops. No clubbing, cyanosis, edema.  Radials/DP/PT 2+ and equal bilaterally.  Respiratory:  Respirations regular and unlabored, clear to auscultation bilaterally. GI: Soft, nontender, nondistended, BS + x 4. MS: no deformity or atrophy. Skin: warm and dry, no rash. Neuro:  Strength and sensation are intact. Psych: Normal affect.  Labs    CBC Recent Labs    07/14/19 1206 07/16/19 0507  WBC 18.1* 15.6*  HGB 14.2 14.1  HCT 42.1 41.8  MCV 87.3 86.9  PLT 289 294   Basic Metabolic Panel Recent Labs    74/08/14 1206 07/16/19 0507  NA 139 135  K 4.2 4.5  CL 107 103  CO2 22 23  GLUCOSE 135* 174*  BUN 22 30*  CREATININE 1.46* 1.45*  CALCIUM 8.9 8.4*   Liver Function Tests No results for input(s): AST, ALT, ALKPHOS, BILITOT, PROT, ALBUMIN in the last 72  hours. No results for input(s): LIPASE, AMYLASE in the last 72 hours. Cardiac Enzymes No results for input(s): CKTOTAL, CKMB, CKMBINDEX, TROPONINI in the last 72 hours. BNP No results for input(s): BNP in the last 72 hours. D-Dimer No results for input(s): DDIMER in the last 72 hours. Hemoglobin A1C No results for input(s): HGBA1C in the last 72 hours. Fasting Lipid Panel Recent Labs    07/15/19 0614  CHOL 128  HDL 50  LDLCALC 64  TRIG 68  CHOLHDL 2.6   Thyroid Function Tests No results for input(s): TSH, T4TOTAL, T3FREE, THYROIDAB in the last 72 hours.  Invalid input(s): FREET3  Telemetry    Sinus rhythm  ECG       Radiology    CARDIAC CATHETERIZATION  Result Date: 07/14/2019  Prox LAD lesion is 95% stenosed.  Ost Cx to Prox Cx lesion is 100% stenosed.  Mid RCA lesion is 60% stenosed.  Ost RPDA to RPDA lesion is 60% stenosed.  RPDA lesion is 50% stenosed.  Prox Graft lesion is 30% stenosed.  Prox Graft lesion is 100% stenosed.  LM patent LAD 95% proximal stenosis LCx occluded proximal RCA- 60% stenosis, RPDA 50% SVG to d2 patent LIMA to lad Patent SVG to om2 occluded. Medical management. Not amenable to pci   DG Chest Portable 1 View  Result Date: 07/14/2019 CLINICAL DATA:  Chest pain, cough EXAM: PORTABLE CHEST 1 VIEW COMPARISON:  03/27/2017 FINDINGS: Prior CABG. The heart size and mediastinal contours are within normal limits. Atherosclerotic calcification of the aortic knob. No focal airspace consolidation, pleural effusion, or pneumothorax. The visualized skeletal structures are unremarkable. IMPRESSION: No active disease. Electronically Signed   By: Duanne Guess D.O.   On: 07/14/2019 13:37   ECHOCARDIOGRAM COMPLETE  Result Date: 07/15/2019    ECHOCARDIOGRAM REPORT   Patient Name:   Benjamin Chan Date of Exam: 07/15/2019 Medical Rec #:  [id number]          Height:       68.0 in Accession #:    [id number]         Weight:       208.8 lb Date of Birth:   [birth year]          BSA:          2.081 m Patient Age:    73 years           BP:           134/72 mmHg Patient Gender: M  HR:           74 bpm. Exam Location:  [facility] Procedure: 2D Echo, Color Doppler and Cardiac Doppler Indications:     I21.4 NSTEMI  History:         Patient has prior history of Echocardiogram examinations.                  Previous Myocardial Infarction and CAD, COPD and CKD; Risk                  Factors:Hypertension and Diabetes.  Sonographer:     Humphrey Rolls RDCS (AE) Referring Phys:  [id number] [facility] AMERY Diagnosing Phys: Harold Hedge MD  Sonographer Comments: No parasternal window. Image acquisition challenging due to patient body habitus and Image acquisition challenging due to COPD. IMPRESSIONS  1. Left ventricular ejection fraction, by estimation, is 50 to 55%. The left ventricle has low normal function. The left ventricle has no regional wall motion abnormalities. The left ventricular internal cavity size was mildly dilated. Left ventricular diastolic parameters were normal.  2. Right ventricular systolic function is normal. The right ventricular size is normal. There is normal pulmonary artery systolic pressure.  3. The mitral valve was not well visualized. Trivial mitral valve regurgitation.  4. The aortic valve was not well visualized. Aortic valve regurgitation is not visualized. FINDINGS  Left Ventricle: Left ventricular ejection fraction, by estimation, is 50 to 55%. The left ventricle has low normal function. The left ventricle has no regional wall motion abnormalities. The left ventricular internal cavity size was mildly dilated. There is borderline left ventricular hypertrophy. Left ventricular diastolic parameters were normal. Right Ventricle: The right ventricular size is normal. No increase in right ventricular wall thickness. Right ventricular systolic function is normal. There is normal pulmonary artery systolic pressure. The tricuspid regurgitant velocity is 2.51  m/s, and  with an assumed right atrial pressure of 10 mmHg, the estimated right ventricular systolic pressure is 35.2 mmHg. Left Atrium: Left atrial size was normal in size. Right Atrium: Right atrial size was normal in size. Pericardium: There is no evidence of pericardial effusion. Mitral Valve: The mitral valve was not well visualized. Trivial mitral valve regurgitation. MV peak gradient, 4.2 mmHg. The mean mitral valve gradient is 2.0 mmHg. Tricuspid Valve: The tricuspid valve is not well visualized. Tricuspid valve regurgitation is trivial. Aortic Valve: The aortic valve was not well visualized. Aortic valve regurgitation is not visualized. Aortic valve mean gradient measures 4.0 mmHg. Aortic valve peak gradient measures 8.5 mmHg. Pulmonic Valve: The pulmonic valve was not well visualized. Pulmonic valve regurgitation is trivial. Aorta: The aortic root is normal in size and structure. IAS/Shunts: The interatrial septum was not assessed.  LEFT VENTRICLE PLAX 2D LVIDd:         4.25 cm Diastology LVIDs:         3.81 cm LV e' lateral:   7.94 cm/s LV PW:         1.28 cm LV E/e' lateral: 11.7 LV IVS:        1.00 cm LV e' medial:    5.22 cm/s                        LV E/e' medial:  17.7  RIGHT VENTRICLE RV Basal diam:  3.03 cm LEFT ATRIUM             Index       RIGHT ATRIUM  Index LA Vol (A2C):   36.0 ml 17.30 ml/m RA Area:     20.30 cm LA Vol (A4C):   58.7 ml 28.20 ml/m RA Volume:   60.60 ml  29.11 ml/m LA Biplane Vol: 46.0 ml 22.10 ml/m  AORTIC VALVE AV Vmax:           146.00 cm/s AV Vmean:          93.200 cm/s AV VTI:            0.231 m AV Peak Grad:      8.5 mmHg AV Mean Grad:      4.0 mmHg LVOT Vmax:         88.70 cm/s LVOT Vmean:        55.300 cm/s LVOT VTI:          0.178 m LVOT/AV VTI ratio: 0.77 MITRAL VALVE                TRICUSPID VALVE MV Area (PHT): 4.60 cm     TR Peak grad:   25.2 mmHg MV Peak grad:  4.2 mmHg     TR Vmax:        251.00 cm/s MV Mean grad:  2.0 mmHg MV Vmax:       1.02 m/s      SHUNTS MV Vmean:      64.1 cm/s    Systemic VTI: 0.18 m MV Decel Time: 165 msec MV E velocity: 92.60 cm/s MV A velocity: 104.00 cm/s MV E/A ratio:  0.89 Harold Hedge MD Electronically signed by Harold Hedge MD Signature Date/Time: 07/15/2019/12:56:40 PM    Final    CT Angio Chest/Abd/Pel for Dissection W and/or Wo Contrast  Result Date: 07/14/2019 CLINICAL DATA:  Chest pain and left shoulder pain EXAM: CT ANGIOGRAPHY CHEST, ABDOMEN AND PELVIS TECHNIQUE: Non-contrast CT of the chest was initially obtained. Multidetector CT imaging through the chest, abdomen and pelvis was performed using the standard protocol during bolus administration of intravenous contrast. Multiplanar reconstructed images and MIPs were obtained and reviewed to evaluate the vascular anatomy. CONTRAST:  OMNIPAQUE IOHEXOL 350 MG/ML SOLN COMPARISON:  None. FINDINGS: CTA CHEST FINDINGS Cardiovascular: Preferential opacification of the thoracic aorta. No evidence of thoracic aortic aneurysm or dissection. Atherosclerotic calcifications of the aorta and coronary arteries. Prior CABG and median sternotomy. Three vessel arch. Prominent atherosclerotic calcifications at the origin of the arch vessels without evidence of hemodynamically significant stenosis. Normal heart size. No pericardial effusion. Mediastinum/Nodes: No enlarged mediastinal, hilar, or axillary lymph nodes. Thyroid gland, trachea, and esophagus demonstrate no significant findings. Lungs/Pleura: Biapical scarring with mild paraseptal emphysema. Right greater than left bibasilar atelectasis. No focal airspace consolidation, pleural effusion, or pneumothorax. Musculoskeletal: No chest wall abnormality. No acute or significant osseous findings. Review of the MIP images confirms the above findings. CTA ABDOMEN AND PELVIS FINDINGS VASCULAR Aorta: Extensive calcified and noncalcified atherosclerotic plaque. No aneurysm, dissection, or significant stenosis. Celiac: Calcified and  noncalcified plaque at the celiac origin with approximately 50% stenosis. Celiac branch vessels are well opacified. No aneurysm or dissection. SMA: Atherosclerotic calcification without significant stenosis, aneurysm, or dissection. Renals: Atherosclerotic calcification without significant stenosis, aneurysm, or dissection. IMA: Patent. Inflow: Extensive calcified and noncalcified atherosclerotic plaque of the bilateral iliac arteries. Right common iliac artery measures 1.6 cm in diameter. Extensive calcified plaques at the bilateral common femoral arteries where there is at least 50% stenosis bilaterally. Veins: No obvious venous abnormality within the limitations of this arterial phase study. Review of the MIP  images confirms the above findings. NON-VASCULAR Hepatobiliary: No focal liver abnormality is seen. No gallstones, gallbladder wall thickening, or biliary dilatation. Pancreas: Unremarkable. No pancreatic ductal dilatation or surrounding inflammatory changes. Spleen: Normal arterial enhancement pattern of the spleen. Adrenals/Urinary Tract: Unremarkable adrenal glands. Kidneys enhance symmetrically. No focal renal lesion, stone, or hydronephrosis. Ureters and urinary bladder within normal limits. Stomach/Bowel: Stomach is within normal limits. Appendix appears normal. No evidence of bowel wall thickening, distention, or inflammatory changes. Lymphatic: No abdominopelvic lymphadenopathy. Reproductive: Prostate is unremarkable. Other: No abdominal wall hernia or abnormality. No abdominopelvic ascites. Musculoskeletal: No acute or significant osseous findings. Review of the MIP images confirms the above findings. IMPRESSION: 1. No evidence of thoracic aortic aneurysm or dissection. 2. No acute findings within the chest, abdomen, or pelvis. 3. Extensive atherosclerosis. 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 At least 50% stenosis at the bilateral common femoral arteries. 4. Aortic Atherosclerosis  (ICD10-I70.0) and Emphysema (ICD10-J43.9). Electronically Signed   By: Duanne Guess D.O.   On: 07/14/2019 14:13    Assessment & Plan    Active Problems:   NSTEMI (non-ST elevated myocardial infarction) ([facility])    1.  NSTEMI             -Underwent left heart catheterization on 07/14/19 - not amenable to PCI              -Medical management recommended; continue aspirin 81mg  daily, Plavix 75mg  daily, atorvastatin 80mg  daily, Zetia 10mg  daily, metoprolol 25mg  BID, and enalapril 20mg  daily              -Echocardiogram revealed preserved ejection fraction with an EF of 50 to 55% with no significant wall motion abnormalities.  No high-grade valvular disease.  -Okay for discharge from a cardiac standpoint today.  We will see as an outpatient in follow-up in 1 week.  Appointment made.  The history, physical exam findings, and plan of care were all discussed with Dr. Harold Hedge, and all decision making was made in collaboration.   Signed, Darlin Priestly Lisamarie Coke MD 07/16/2019, 9:09 AM  Pager: [phone]

## 2019-07-16 NOTE — Discharge Summary (Signed)
Benjamin Chan QQP:[id number] DOB: [birth year] DOA: 07/14/2019  PCP: [name], PA-C  Admit date: 07/14/2019 Discharge date: 07/16/2019  Admitted From: home Disposition:  home  Recommendations for Outpatient Follow-up:  1. Follow up with PCP in 1 week 2. Please obtain BMP/CBC in one week 3. Follow-up with cardiology Dr. Lady Gary next week 4. Follow up with cultures     Discharge Condition:Stable CODE STATUS: Full code Diet recommendation: Heart Healthy  Brief/Interim Summary: Benjamin Chan is a 74 y.o. male with medical history significant of CAD, status post CABG and PCI, hypertension, diabetes mellitus, chronic kidney disease presents complaining of couple of days of chest pressure with increased progressive shortness of breath with exertion.  EKG found with ST changes in the inferior leads.  Troponins were elevated.  He was started on heparin drip and management for NSTEMI.  Subsequently patient was taken to cardiac cath after cardiology had seen the patient in the ER.Catheterization revealed a patent LM, patent LIMA to the LAD, patent SVG to D2, and an occluded SVG to OM2. LAD had 95% proximal stenosis, left circumflex was chronically occluded, RCA had 60% stenosis and RPDA had 50% stenosis. Recommendations were for medically management as lesions were not amenable to PCI.  Patient has been chest pain-free.  Ambulating.  Per cardiology he is stable to be discharged home.   1. Acute MI/NSTEMI- With EKG changes and elevated TP. Cardiology following, status post cath did not require PCI Echo EF 50-55%   2.Diabetes mellitus-hemoglobin A1c on 4/12 was 7.6   3.h/xo CAD/cabg and PCI Continue asa, statin, acei, and beta blk  4. HTN- continue acei, beta blk, and amlodipine  5.CKD stage IIIa- stable at baseline  6.Fever- possibly from acs v.s. infection. Wbc elevated on admission Cultures pending Wbc decreasing, likely from stress. Will need cultures to be  followed up as outpt. No indication for abx at this time  Discharge Diagnoses:  Active Problems:   NSTEMI (non-ST elevated myocardial infarction) [facility])    Discharge Instructions  Discharge Instructions    AMB Referral to Cardiac Rehabilitation - Phase II   Complete by: As directed    Diagnosis: NSTEMI   After initial evaluation and assessments completed: Virtual Based Care may be provided alone or in conjunction with Phase 2 Cardiac Rehab based on patient barriers.: Yes   Call MD for:  difficulty breathing, headache or visual disturbances   Complete by: As directed    Diet - low sodium heart healthy   Complete by: As directed    Discharge instructions   Complete by: As directed    Be compliant with meds. Please eat heart healthy diet, no fried foods, cookies, and fatty foods Try to increase walking activity as tolerated dont lift anything heavy more than 10 pounds   Increase activity slowly   Complete by: As directed      Allergies as of 07/16/2019      Reactions   Brilinta [ticagrelor] Shortness Of Breath   D/c by cardiology      Medication List    STOP taking these medications   doxazosin 2 MG tablet Commonly known as: CARDURA   hydrALAZINE 25 MG tablet Commonly known as: APRESOLINE   predniSONE 10 MG tablet Commonly known as: DELTASONE     TAKE these medications   acetaminophen 325 MG tablet Commonly known as: TYLENOL Take 650 mg by mouth every 6 (six) hours as needed.   albuterol 108 (90 Base) MCG/ACT inhaler Commonly known as: VENTOLIN HFA  Inhale 2 puffs into the lungs every 6 (six) hours as needed for wheezing or shortness of breath.   albuterol (2.5 MG/3ML) 0.083% nebulizer solution Commonly known as: PROVENTIL Take 3 mLs (2.5 mg total) by nebulization every 6 (six) hours as needed for wheezing or shortness of breath.   amLODipine 10 MG tablet Commonly known as: NORVASC Take 0.5 tablets (5 mg total) by mouth daily. What changed: how much to take    aspirin EC 81 MG tablet Take 1 tablet (81 mg total) by mouth daily.   atorvastatin 80 MG tablet Commonly known as: LIPITOR Take 1 tablet (80 mg total) by mouth daily.   Basaglar KwikPen 100 UNIT/ML Inject 0.56 mLs (56 Units total) into the skin daily.   clopidogrel 75 MG tablet Commonly known as: PLAVIX Take 1 tablet (75 mg total) by mouth daily with breakfast. Start taking on: Jul 17, 2019   enalapril 20 MG tablet Commonly known as: VASOTEC Take 1 tablet (20 mg total) by mouth 2 (two) times daily.   ezetimibe 10 MG tablet Commonly known as: ZETIA Take 1 tablet (10 mg total) by mouth daily. What changed:   how much to take  when to take this   finasteride 5 MG tablet Commonly known as: PROSCAR Take 1 tablet (5 mg total) by mouth daily.   fluticasone 50 MCG/ACT nasal spray Commonly known as: FLONASE Place 1 spray into both nostrils 2 (two) times daily. Pt takes twice a day   glipiZIDE 5 MG tablet Commonly known as: GLUCOTROL Take 1 tablet (5 mg total) by mouth daily before breakfast.   metFORMIN 500 MG 24 hr tablet Commonly known as: GLUCOPHAGE-XR Take 2 tablets (1,000 mg total) by mouth 2 (two) times daily.   metoprolol tartrate 25 MG tablet Commonly known as: LOPRESSOR Take 1 tablet (25 mg total) by mouth 2 (two) times daily.   nitroGLYCERIN 0.4 MG SL tablet Commonly known as: NITROSTAT Place 1 tablet (0.4 mg total) under the tongue every 5 (five) minutes as needed for chest pain.   Trelegy Ellipta 100-62.5-25 MCG/INH Aepb Generic drug: Fluticasone-Umeclidin-Vilant Inhale 1 puff into the lungs daily.      Follow-up Information    Dalia Heading, MD Follow up in 1 week(s).   Specialty: Cardiology Contact information: [address] [place] [address] [phone]        [name], PA-C Follow up in 1 week(s).   Specialty: Family Medicine Contact information: [address] [place] [address] [phone]           Allergies  Allergen Reactions  . Brilinta [Ticagrelor] Shortness Of Breath    D/c by cardiology    Consultations:  Cardiology   Procedures/Studies: CARDIAC CATHETERIZATION  Result Date: 07/14/2019  Prox LAD lesion is 95% stenosed.  Ost Cx to Prox Cx lesion is 100% stenosed.  Mid RCA lesion is 60% stenosed.  Ost RPDA to RPDA lesion is 60% stenosed.  RPDA lesion is 50% stenosed.  Prox Graft lesion is 30% stenosed.  Prox Graft lesion is 100% stenosed.  LM patent LAD 95% proximal stenosis LCx occluded proximal RCA- 60% stenosis, RPDA 50% SVG to d2 patent LIMA to lad Patent SVG to om2 occluded. Medical management. Not amenable to pci   DG Chest Portable 1 View  Result Date: 07/14/2019 CLINICAL DATA:  Chest pain, cough EXAM: PORTABLE CHEST 1 VIEW COMPARISON:  03/27/2017 FINDINGS: Prior CABG. The heart size and mediastinal contours are within normal limits. Atherosclerotic calcification of  the aortic knob. No focal airspace consolidation, pleural effusion, or pneumothorax. The visualized skeletal structures are unremarkable. IMPRESSION: No active disease. Electronically Signed   By: Davina Poke D.O.   On: 07/14/2019 13:37   ECHOCARDIOGRAM COMPLETE  Result Date: 07/15/2019    ECHOCARDIOGRAM REPORT   Patient Name:   Benjamin Chan Date of Exam: 07/15/2019 Medical Rec #:  [id number]          Height:       68.0 in Accession #:    [id number]         Weight:       208.8 lb Date of Birth:  1945-08-20          BSA:          2.081 m Patient Age:    27 years           BP:           134/72 mmHg Patient Gender: M                  HR:           74 bpm. Exam Location:  [facility] Procedure: 2D Echo, Color Doppler and Cardiac Doppler Indications:     I21.4 NSTEMI  History:         Patient has prior history of Echocardiogram examinations.                  Previous Myocardial Infarction and CAD, COPD and CKD; Risk                  Factors:Hypertension and Diabetes.  Sonographer:     Charmayne Sheer RDCS (AE)  Referring Phys:  [id number] [place] Diagnosing Phys: Bartholome Bill MD  Sonographer Comments: No parasternal window. Image acquisition challenging due to patient body habitus and Image acquisition challenging due to COPD. IMPRESSIONS  1. Left ventricular ejection fraction, by estimation, is 50 to 55%. The left ventricle has low normal function. The left ventricle has no regional wall motion abnormalities. The left ventricular internal cavity size was mildly dilated. Left ventricular diastolic parameters were normal.  2. Right ventricular systolic function is normal. The right ventricular size is normal. There is normal pulmonary artery systolic pressure.  3. The mitral valve was not well visualized. Trivial mitral valve regurgitation.  4. The aortic valve was not well visualized. Aortic valve regurgitation is not visualized. FINDINGS  Left Ventricle: Left ventricular ejection fraction, by estimation, is 50 to 55%. The left ventricle has low normal function. The left ventricle has no regional wall motion abnormalities. The left ventricular internal cavity size was mildly dilated. There is borderline left ventricular hypertrophy. Left ventricular diastolic parameters were normal. Right Ventricle: The right ventricular size is normal. No increase in right ventricular wall thickness. Right ventricular systolic function is normal. There is normal pulmonary artery systolic pressure. The tricuspid regurgitant velocity is 2.51 m/s, and  with an assumed right atrial pressure of 10 mmHg, the estimated right ventricular systolic pressure is 73.4 mmHg. Left Atrium: Left atrial size was normal in size. Right Atrium: Right atrial size was normal in size. Pericardium: There is no evidence of pericardial effusion. Mitral Valve: The mitral valve was not well visualized. Trivial mitral valve regurgitation. MV peak gradient, 4.2 mmHg. The mean mitral valve gradient is 2.0 mmHg. Tricuspid Valve: The tricuspid valve is not well  visualized. Tricuspid valve regurgitation is trivial. Aortic Valve: The aortic valve was not well visualized. Aortic valve regurgitation is  not visualized. Aortic valve mean gradient measures 4.0 mmHg. Aortic valve peak gradient measures 8.5 mmHg. Pulmonic Valve: The pulmonic valve was not well visualized. Pulmonic valve regurgitation is trivial. Aorta: The aortic root is normal in size and structure. IAS/Shunts: The interatrial septum was not assessed.  LEFT VENTRICLE PLAX 2D LVIDd:         4.25 cm Diastology LVIDs:         3.81 cm LV e' lateral:   7.94 cm/s LV PW:         1.28 cm LV E/e' lateral: 11.7 LV IVS:        1.00 cm LV e' medial:    5.22 cm/s                        LV E/e' medial:  17.7  RIGHT VENTRICLE RV Basal diam:  3.03 cm LEFT ATRIUM             Index       RIGHT ATRIUM           Index LA Vol (A2C):   36.0 ml 17.30 ml/m RA Area:     20.30 cm LA Vol (A4C):   58.7 ml 28.20 ml/m RA Volume:   60.60 ml  29.11 ml/m LA Biplane Vol: 46.0 ml 22.10 ml/m  AORTIC VALVE AV Vmax:           146.00 cm/s AV Vmean:          93.200 cm/s AV VTI:            0.231 m AV Peak Grad:      8.5 mmHg AV Mean Grad:      4.0 mmHg LVOT Vmax:         88.70 cm/s LVOT Vmean:        55.300 cm/s LVOT VTI:          0.178 m LVOT/AV VTI ratio: 0.77 MITRAL VALVE                TRICUSPID VALVE MV Area (PHT): 4.60 cm     TR Peak grad:   25.2 mmHg MV Peak grad:  4.2 mmHg     TR Vmax:        251.00 cm/s MV Mean grad:  2.0 mmHg MV Vmax:       1.02 m/s     SHUNTS MV Vmean:      64.1 cm/s    Systemic VTI: 0.18 m MV Decel Time: 165 msec MV E velocity: 92.60 cm/s MV A velocity: 104.00 cm/s MV E/A ratio:  0.89 Harold Hedge MD Electronically signed by Harold Hedge MD Signature Date/Time: 07/15/2019/12:56:40 PM    Final    CT Angio Chest/Abd/Pel for Dissection W and/or Wo Contrast  Result Date: 07/14/2019 CLINICAL DATA:  Chest pain and left shoulder pain EXAM: CT ANGIOGRAPHY CHEST, ABDOMEN AND PELVIS TECHNIQUE: Non-contrast CT of the chest was  initially obtained. Multidetector CT imaging through the chest, abdomen and pelvis was performed using the standard protocol during bolus administration of intravenous contrast. Multiplanar reconstructed images and MIPs were obtained and reviewed to evaluate the vascular anatomy. CONTRAST:  OMNIPAQUE IOHEXOL 350 MG/ML SOLN COMPARISON:  None. FINDINGS: CTA CHEST FINDINGS Cardiovascular: Preferential opacification of the thoracic aorta. No evidence of thoracic aortic aneurysm or dissection. Atherosclerotic calcifications of the aorta and coronary arteries. Prior CABG and median sternotomy. Three vessel arch. Prominent atherosclerotic calcifications at the origin of the arch vessels without evidence  of hemodynamically significant stenosis. Normal heart size. No pericardial effusion. Mediastinum/Nodes: No enlarged mediastinal, hilar, or axillary lymph nodes. Thyroid gland, trachea, and esophagus demonstrate no significant findings. Lungs/Pleura: Biapical scarring with mild paraseptal emphysema. Right greater than left bibasilar atelectasis. No focal airspace consolidation, pleural effusion, or pneumothorax. Musculoskeletal: No chest wall abnormality. No acute or significant osseous findings. Review of the MIP images confirms the above findings. CTA ABDOMEN AND PELVIS FINDINGS VASCULAR Aorta: Extensive calcified and noncalcified atherosclerotic plaque. No aneurysm, dissection, or significant stenosis. Celiac: Calcified and noncalcified plaque at the celiac origin with approximately 50% stenosis. Celiac branch vessels are well opacified. No aneurysm or dissection. SMA: Atherosclerotic calcification without significant stenosis, aneurysm, or dissection. Renals: Atherosclerotic calcification without significant stenosis, aneurysm, or dissection. IMA: Patent. Inflow: Extensive calcified and noncalcified atherosclerotic plaque of the bilateral iliac arteries. Right common iliac artery measures 1.6 cm in diameter.  Extensive calcified plaques at the bilateral common femoral arteries where there is at least 50% stenosis bilaterally. Veins: No obvious venous abnormality within the limitations of this arterial phase study. Review of the MIP images confirms the above findings. NON-VASCULAR Hepatobiliary: No focal liver abnormality is seen. No gallstones, gallbladder wall thickening, or biliary dilatation. Pancreas: Unremarkable. No pancreatic ductal dilatation or surrounding inflammatory changes. Spleen: Normal arterial enhancement pattern of the spleen. Adrenals/Urinary Tract: Unremarkable adrenal glands. Kidneys enhance symmetrically. No focal renal lesion, stone, or hydronephrosis. Ureters and urinary bladder within normal limits. Stomach/Bowel: Stomach is within normal limits. Appendix appears normal. No evidence of bowel wall thickening, distention, or inflammatory changes. Lymphatic: No abdominopelvic lymphadenopathy. Reproductive: Prostate is unremarkable. Other: No abdominal wall hernia or abnormality. No abdominopelvic ascites. Musculoskeletal: No acute or significant osseous findings. Review of the MIP images confirms the above findings. IMPRESSION: 1. No evidence of thoracic aortic aneurysm or dissection. 2. No acute findings within the chest, abdomen, or pelvis. 3. Extensive atherosclerosis. 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 At least 50% stenosis at the bilateral common femoral arteries. 4. Aortic Atherosclerosis (ICD10-I70.0) and Emphysema (ICD10-J43.9). Electronically Signed   By: Duanne Guess D.O.   On: 07/14/2019 14:13      Subjective: No chest pain or shortness of breath  Discharge Exam: Vitals:   07/16/19 0815 07/16/19 1033  BP: (!) 156/70 133/66  Pulse: 95 73  Resp:  18  Temp: 98.6 F (37 C) 98.3 F (36.8 C)  SpO2: 95% 94%   Vitals:   07/15/19 2227 07/16/19 0405 07/16/19 0815 07/16/19 1033  BP: (!) 159/73 (!) 141/67 (!) 156/70 133/66  Pulse: 88 76 95 73  Resp:  20  18   Temp:  98.7 F (37.1 C) 98.6 F (37 C) 98.3 F (36.8 C)  TempSrc:  Oral  Oral  SpO2:  98% 95% 94%  Weight:  93.6 kg    Height:        General: Pt is alert, awake, not in acute distress Cardiovascular: RRR, S1/S2 +, no rubs, no gallops Respiratory: CTA bilaterally, no wheezing, no rhonchi Abdominal: Soft, NT, ND, bowel sounds + Extremities: no edema, no cyanosis    The results of significant diagnostics from this hospitalization (including imaging, microbiology, ancillary and laboratory) are listed below for reference.     Microbiology: Recent Results (from the past 240 hour(s))  Respiratory Panel by RT PCR (Flu A&B, Covid) - Nasopharyngeal Swab     Status: None   Collection Time: 07/14/19  2:19 PM   Specimen: Nasopharyngeal Swab  Result Value Ref Range Status   SARS  Coronavirus 2 by RT PCR NEGATIVE NEGATIVE Final    Comment: (NOTE) SARS-CoV-2 target nucleic acids are NOT DETECTED. The SARS-CoV-2 RNA is generally detectable in upper respiratoy specimens during the acute phase of infection. The lowest concentration of SARS-CoV-2 viral copies this assay can detect is 131 copies/mL. A negative result does not preclude SARS-Cov-2 infection and should not be used as the sole basis for treatment or other patient management decisions. A negative result may occur with  improper specimen collection/handling, submission of specimen other than nasopharyngeal swab, presence of viral mutation(s) within the areas targeted by this assay, and inadequate number of viral copies (<131 copies/mL). A negative result must be combined with clinical observations, patient history, and epidemiological information. The expected result is Negative. Fact Sheet for Patients:  https://www.moore.com/ Fact Sheet for Healthcare Providers:  https://www.young.biz/ This test is not yet ap proved or cleared by the [place] FDA and  has been authorized for  detection and/or diagnosis of SARS-CoV-2 by FDA under an Emergency Use Authorization (EUA). This EUA will remain  in effect (meaning this test can be used) for the duration of the COVID-19 declaration under Section 564(b)(1) of the Act, 21 U.S.C. section 360bbb-3(b)(1), unless the authorization is terminated or revoked sooner.    Influenza A by PCR NEGATIVE NEGATIVE Final   Influenza B by PCR NEGATIVE NEGATIVE Final    Comment: (NOTE) The Xpert Xpress SARS-CoV-2/FLU/RSV assay is intended as an aid in  the diagnosis of influenza from Nasopharyngeal swab specimens and  should not be used as a sole basis for treatment. Nasal washings and  aspirates are unacceptable for Xpert Xpress SARS-CoV-2/FLU/RSV  testing. Fact Sheet for Patients: https://www.moore.com/ Fact Sheet for Healthcare Providers: https://www.young.biz/ This test is not yet approved or cleared by the [place] FDA and  has been authorized for detection and/or diagnosis of SARS-CoV-2 by  FDA under an Emergency Use Authorization (EUA). This EUA will remain  in effect (meaning this test can be used) for the duration of the  Covid-19 declaration under Section 564(b)(1) of the Act, 21  U.S.C. section 360bbb-3(b)(1), unless the authorization is  terminated or revoked. Performed at [facility], [address] [place], [place] [address]   CULTURE, BLOOD (ROUTINE X 2) w Reflex to ID Panel     Status: None (Preliminary result)   Collection Time: 07/15/19  4:21 PM   Specimen: BLOOD  Result Value Ref Range Status   Specimen Description BLOOD BLOOD RIGHT HAND  Final   Special Requests   Final    BOTTLES DRAWN AEROBIC AND ANAEROBIC Blood Culture adequate volume   Culture   Final    NO GROWTH < 24 HOURS Performed at [facility], [address], [place], [place] [address]    Report Status PENDING  Incomplete  CULTURE, BLOOD (ROUTINE X 2) w Reflex to ID Panel      Status: None (Preliminary result)   Collection Time: 07/15/19  4:26 PM   Specimen: BLOOD  Result Value Ref Range Status   Specimen Description BLOOD RIGHT ANTECUBITAL  Final   Special Requests   Final    BOTTLES DRAWN AEROBIC AND ANAEROBIC Blood Culture adequate volume   Culture   Final    NO GROWTH < 24 HOURS Performed at [facility], [address] [place], [place] [address]    Report Status PENDING  Incomplete     Labs: BNP (last 3 results) No results for input(s): BNP in the last 8760 hours. Basic  Metabolic Panel: Recent Labs  Lab 07/14/19 1206 07/16/19 0507  NA 139 135  K 4.2 4.5  CL 107 103  CO2 22 23  GLUCOSE 135* 174*  BUN 22 30*  CREATININE 1.46* 1.45*  CALCIUM 8.9 8.4*   Liver Function Tests: No results for input(s): AST, ALT, ALKPHOS, BILITOT, PROT, ALBUMIN in the last 168 hours. No results for input(s): LIPASE, AMYLASE in the last 168 hours. No results for input(s): AMMONIA in the last 168 hours. CBC: Recent Labs  Lab 07/14/19 1206 07/16/19 0507  WBC 18.1* 15.6*  HGB 14.2 14.1  HCT 42.1 41.8  MCV 87.3 86.9  PLT 289 294   Cardiac Enzymes: No results for input(s): CKTOTAL, CKMB, CKMBINDEX, TROPONINI in the last 168 hours. BNP: Invalid input(s): POCBNP CBG: Recent Labs  Lab 07/15/19 0801 07/15/19 1145 07/15/19 1628 07/15/19 2024 07/16/19 0813  GLUCAP 157* 194* 172* 246* 167*   D-Dimer No results for input(s): DDIMER in the last 72 hours. Hgb A1c No results for input(s): HGBA1C in the last 72 hours. Lipid Profile Recent Labs    07/15/19 0614  CHOL 128  HDL 50  LDLCALC 64  TRIG 68  CHOLHDL 2.6   Thyroid function studies No results for input(s): TSH, T4TOTAL, T3FREE, THYROIDAB in the last 72 hours.  Invalid input(s): FREET3 Anemia work up No results for input(s): VITAMINB12, FOLATE, FERRITIN, TIBC, IRON, RETICCTPCT in the last 72 hours. Urinalysis    Component Value Date/Time   COLORURINE YELLOW (A) 07/15/2019 1642    APPEARANCEUR CLEAR (A) 07/15/2019 1642   APPEARANCEUR Turbid (A) 07/10/2017 0931   LABSPEC 1.023 07/15/2019 1642   PHURINE 5.0 07/15/2019 1642   GLUCOSEU >=500 (A) 07/15/2019 1642   HGBUR SMALL (A) 07/15/2019 1642   BILIRUBINUR NEGATIVE 07/15/2019 1642   BILIRUBINUR Negative 07/10/2017 0931   KETONESUR NEGATIVE 07/15/2019 1642   PROTEINUR 30 (A) 07/15/2019 1642   NITRITE NEGATIVE 07/15/2019 1642   LEUKOCYTESUR NEGATIVE 07/15/2019 1642   Sepsis Labs Invalid input(s): PROCALCITONIN,  WBC,  LACTICIDVEN Microbiology Recent Results (from the past 240 hour(s))  Respiratory Panel by RT PCR (Flu A&B, Covid) - Nasopharyngeal Swab     Status: None   Collection Time: 07/14/19  2:19 PM   Specimen: Nasopharyngeal Swab  Result Value Ref Range Status   SARS Coronavirus 2 by RT PCR NEGATIVE NEGATIVE Final    Comment: (NOTE) SARS-CoV-2 target nucleic acids are NOT DETECTED. The SARS-CoV-2 RNA is generally detectable in upper respiratoy specimens during the acute phase of infection. The lowest concentration of SARS-CoV-2 viral copies this assay can detect is 131 copies/mL. A negative result does not preclude SARS-Cov-2 infection and should not be used as the sole basis for treatment or other patient management decisions. A negative result may occur with  improper specimen collection/handling, submission of specimen other than nasopharyngeal swab, presence of viral mutation(s) within the areas targeted by this assay, and inadequate number of viral copies (<131 copies/mL). A negative result must be combined with clinical observations, patient history, and epidemiological information. The expected result is Negative. Fact Sheet for Patients:  https://www.moore.com/ Fact Sheet for Healthcare Providers:  https://www.young.biz/ This test is not yet ap proved or cleared by the [place] FDA and  has been authorized for detection and/or diagnosis of  SARS-CoV-2 by FDA under an Emergency Use Authorization (EUA). This EUA will remain  in effect (meaning this test can be used) for the duration of the COVID-19 declaration under Section 564(b)(1) of the Act, 21 U.S.C. section  360bbb-3(b)(1), unless the authorization is terminated or revoked sooner.    Influenza A by PCR NEGATIVE NEGATIVE Final   Influenza B by PCR NEGATIVE NEGATIVE Final    Comment: (NOTE) The Xpert Xpress SARS-CoV-2/FLU/RSV assay is intended as an aid in  the diagnosis of influenza from Nasopharyngeal swab specimens and  should not be used as a sole basis for treatment. Nasal washings and  aspirates are unacceptable for Xpert Xpress SARS-CoV-2/FLU/RSV  testing. Fact Sheet for Patients: https://www.moore.com/ Fact Sheet for Healthcare Providers: https://www.young.biz/ This test is not yet approved or cleared by the [place] FDA and  has been authorized for detection and/or diagnosis of SARS-CoV-2 by  FDA under an Emergency Use Authorization (EUA). This EUA will remain  in effect (meaning this test can be used) for the duration of the  Covid-19 declaration under Section 564(b)(1) of the Act, 21  U.S.C. section [id number](b)(1), unless the authorization is  terminated or revoked. Performed at [facility], [address] [place], [place] [address]   CULTURE, BLOOD (ROUTINE X 2) w Reflex to ID Panel     Status: None (Preliminary result)   Collection Time: 07/15/19  4:21 PM   Specimen: BLOOD  Result Value Ref Range Status   Specimen Description BLOOD BLOOD RIGHT HAND  Final   Special Requests   Final    BOTTLES DRAWN AEROBIC AND ANAEROBIC Blood Culture adequate volume   Culture   Final    NO GROWTH < 24 HOURS Performed at [facility], [address], [place], [place] [address]    Report Status PENDING  Incomplete  CULTURE, BLOOD (ROUTINE X 2) w Reflex to ID Panel     Status: None (Preliminary result)    Collection Time: 07/15/19  4:26 PM   Specimen: BLOOD  Result Value Ref Range Status   Specimen Description BLOOD RIGHT ANTECUBITAL  Final   Special Requests   Final    BOTTLES DRAWN AEROBIC AND ANAEROBIC Blood Culture adequate volume   Culture   Final    NO GROWTH < 24 HOURS Performed at [facility], [address] [place], [place] [address]    Report Status PENDING  Incomplete     Time coordinating discharge: Over 30 minutes  SIGNED:   Lynn Ito, MD  [facility] 07/16/2019, 11:31 AM Pager   If 7PM-7AM, please contact night-coverage [email or web address] Password [email or web address]

## 2019-07-16 NOTE — Progress Notes (Signed)
Discharge instructions explained to pt/ verbalized an understanding / iv and tele removed/ transported off unit via wheelchair.  

## 2019-07-18 ENCOUNTER — Telehealth: Payer: Self-pay

## 2019-07-18 LAB — URINE CULTURE: Culture: 30000 — AB

## 2019-07-18 NOTE — Telephone Encounter (Signed)
Transition Care Management Follow-up Telephone Call  Date of discharge and from where: 07/16/2019, [facility]  How have you been since you were released from the hospital? Patient states that he is feeling much better and has no complaints at this time.   Any questions or concerns? No   Items Reviewed:  Did the pt receive and understand the discharge instructions provided? Yes   Medications obtained and verified? Yes   Any new allergies since your discharge? No   Dietary orders reviewed? Yes  Do you have support at home? Yes   Functional Questionnaire: (I = Independent and D = Dependent) ADLs: I  Bathing/Dressing- I  Meal Prep- I  Eating- I  Maintaining continence- I  Transferring/Ambulation- I  Managing Meds- I  Follow up appointments reviewed:   PCP Hospital f/u appt confirmed? No , Only 30 appointments available on providers schedule in the next 2 weeks was on Tuesday and Thursday morning of next week. Patient can not do either one because he takes his wife to dialysis on those days in the mornings. He can do Monday, Wednesday, Friday or Tuesday and Thursday afternoons. Advised patient someone from the office will call him to get him worked in within the next 2 weeks.   Specialist Hospital f/u appt confirmed? advised to follow up with cardiology   Are transportation arrangements needed? No   If their condition worsens, is the pt aware to call PCP or go to the Emergency Dept.? Yes  Was the patient provided with contact information for the PCP's office or ED? Yes  Was to pt encouraged to call back with questions or concerns? Yes

## 2019-07-18 NOTE — Telephone Encounter (Signed)
I'm guessing this was marked high priority because they didn't schedule him due to slot availability? Looks like both me or Benjamin Chan could see him Monday if that works for him or just see about putting him wherever able next week

## 2019-07-18 NOTE — Telephone Encounter (Signed)
No HFU scheduled.  

## 2019-07-18 NOTE — Telephone Encounter (Signed)
Scheduled for 07/25/19 2:30

## 2019-07-20 ENCOUNTER — Telehealth: Payer: Self-pay

## 2019-07-20 ENCOUNTER — Ambulatory Visit: Payer: Medicare HMO

## 2019-07-20 LAB — CULTURE, BLOOD (ROUTINE X 2)
Culture: NO GROWTH
Culture: NO GROWTH
Special Requests: ADEQUATE
Special Requests: ADEQUATE

## 2019-07-22 ENCOUNTER — Ambulatory Visit (INDEPENDENT_AMBULATORY_CARE_PROVIDER_SITE_OTHER): Payer: Medicare HMO | Admitting: Pharmacist

## 2019-07-22 DIAGNOSIS — E1129 Type 2 diabetes mellitus with other diabetic kidney complication: Secondary | ICD-10-CM | POA: Diagnosis not present

## 2019-07-22 DIAGNOSIS — I25810 Atherosclerosis of coronary artery bypass graft(s) without angina pectoris: Secondary | ICD-10-CM | POA: Diagnosis not present

## 2019-07-22 DIAGNOSIS — E1165 Type 2 diabetes mellitus with hyperglycemia: Secondary | ICD-10-CM

## 2019-07-22 DIAGNOSIS — IMO0002 Reserved for concepts with insufficient information to code with codable children: Secondary | ICD-10-CM

## 2019-07-22 NOTE — Chronic Care Management (AMB) (Signed)
Chronic Care Management   Follow Up Note   07/22/2019 Name: Benjamin Chan MRN: [id number] DOB: [birth year]  Referred by: Benjamin American, PA-C Reason for referral : Chronic Care Management (Medication Management)   MACAULEY Chan is a 74 y.o. year old male who is a primary care patient of Benjamin Chan, [place]. The CCM team was consulted for assistance with chronic disease management and care coordination needs.    Contacted patient for medication management review. He was out mowing lawns, so I also called his home number and spoke with his wife.   Review of patient status, including review of consultants reports, relevant laboratory and other test results, and collaboration with appropriate care team members and the patient's provider was performed as part of comprehensive patient evaluation and provision of chronic care management services.    SDOH (Social Determinants of Health) assessments performed: Yes See Care Plan activities for detailed interventions related to Benjamin Chan)     Outpatient Encounter Medications as of 07/22/2019  Medication Sig Note  . amLODipine (NORVASC) 10 MG tablet Take 0.5 tablets (5 mg total) by mouth daily.   [name] aspirin EC 81 MG tablet Take 1 tablet (81 mg total) by mouth daily.   [name] atorvastatin (LIPITOR) 80 MG tablet Take 1 tablet (80 mg total) by mouth daily.   . clopidogrel (PLAVIX) 75 MG tablet Take 1 tablet (75 mg total) by mouth daily with breakfast.   . empagliflozin (JARDIANCE) 25 MG TABS tablet Take 25 mg by mouth daily.   . enalapril (VASOTEC) 20 MG tablet Take 1 tablet (20 mg total) by mouth 2 (two) times daily.   [name] ezetimibe (ZETIA) 10 MG tablet Take 1 tablet (10 mg total) by mouth daily.   . finasteride (PROSCAR) 5 MG tablet Take 1 tablet (5 mg total) by mouth daily.   [name] glipiZIDE (GLUCOTROL) 5 MG tablet Take 1 tablet (5 mg total) by mouth daily before breakfast.   . Insulin Glargine (BASAGLAR KWIKPEN) 100 UNIT/ML SOPN Inject  0.56 mLs (56 Units total) into the skin daily.   . metFORMIN (GLUCOPHAGE-XR) 500 MG 24 hr tablet Take 2 tablets (1,000 mg total) by mouth 2 (two) times daily.   . metoprolol tartrate (LOPRESSOR) 25 MG tablet Take 1 tablet (25 mg total) by mouth 2 (two) times daily.   [name] acetaminophen (TYLENOL) 325 MG tablet Take 650 mg by mouth every 6 (six) hours as needed.   [name] albuterol (PROVENTIL) (2.5 MG/3ML) 0.083% nebulizer solution Take 3 mLs (2.5 mg total) by nebulization every 6 (six) hours as needed for wheezing or shortness of breath.   [name] albuterol (VENTOLIN HFA) 108 (90 Base) MCG/ACT inhaler Inhale 2 puffs into the lungs every 6 (six) hours as needed for wheezing or shortness of breath. 05/25/2019: Using BID  . fluticasone (FLONASE) 50 MCG/ACT nasal spray Place 1 spray into both nostrils 2 (two) times daily. Pt takes twice a day 05/25/2019: Using PRN  . Fluticasone-Umeclidin-Vilant (TRELEGY ELLIPTA) 100-62.5-25 MCG/INH AEPB Inhale 1 puff into the lungs daily.   . nitroGLYCERIN (NITROSTAT) 0.4 MG SL tablet Place 1 tablet (0.4 mg total) under the tongue every 5 (five) minutes as needed for chest pain.    No facility-administered encounter medications on file as of 07/22/2019.     Objective:   Goals Addressed            This Visit's Progress     Patient Stated   . "We want to keep his blood sugars better controlled" (  pt-stated)       CARE PLAN ENTRY (see longtitudinal plan of care for additional care plan information)  Current Barriers:  . Diabetes: uncontrolled though improved, A1c 7.6% o Recent hospitalization for chest pain. D/c hydralazine, doxazosin. Wife reports that BP has been well controlled since coming home 120s/60s, but does not have readings written down. Patient and wife report that he is feeling well since discharge, with no recurrent of chest discomfort o Using adherence packing from [facility]jamin [facility]. Wife confirms that they took the packs to pharmacy to have discontinued  medications removed with recent medication changes  . Current antihyperglycemic regimen: Basaglar 56 units QAM, glipizide 5 mg QAM, metformin 1000 mg BID, Jardiance 25 mg  o APPROVED for Jardiance assistance through [employer] through 03/16/20 o APPROVED for [employer] assistance through [place] through 03/16/20 . Current meal patterns: o Wife confirms that he has been avoiding sodas and sweets . Current glucose readings:  o Patient has NOT been checking since he came home o Wife reports that patient sometimes wakes up overnight to snack, because he feels his blood sugar is low . Cardiovascular risk reduction (CAD s/p NSTEMI 1+ years ago, Followed by Dr. Gwen Chan); LHC did not show areas amenable to PCI, medical management recommended; ECHO showed EF 50-55% o Current hypertensive regimen: enalapril 20 mg BID, metoprolol tartrate 25 mg BID; amlodipine 5 mg daily; wife reports SBP well controlled ~120s, but has not been writing down readings.  o Current hyperlipidemia regimen: atorvastatin 80 mg daily, ezetimibe 10 mg daily; LDL at goal <70  Pharmacist Clinical Goal(s):  [name] Over the next 90 days, patient with work with PharmD and primary care provider to address optimized medication management  Interventions: . Comprehensive medication review performed, medication list updated in electronic medical record . Inter-disciplinary care team collaboration (see longitudinal plan of care) . Encouraged wife and patient to check BG and BP readings prior to f/u with PCP on Monday . Reviewed goal A1c, goal fasting, and goal 2 hour post prandial. . Discussed benefit of addition of GLP1, given the fact that A1c is not controlled, but patient reporting some symptoms of overnight hypoglycemia, so titration of Basaglar is limited. Recommend addition of Trulicity. Patient can receive for free through [facility] patient assistance, we would just need to send a prescription page. Will collaborate w/ PCP on this  . Will continue  to support adherence, given need to maximize medical management of risk factor reduction  Patient Self Care Activities:  . Patient will check blood glucose BID , document, and provide at future appointments . Patient will take medications as prescribed . Patient will report any questions or concerns to provider   Please see past updates related to this goal by clicking on the "Past Updates" button in the selected goal           Plan:  - Scheduled f/u call in ~10 weeks  Catie Feliz Beam, PharmD, [facility] Clinical Pharmacist [facility] [phone]

## 2019-07-22 NOTE — Patient Instructions (Signed)
Visit Information  Goals Addressed            This Visit's Progress     Patient Stated   . "We want to keep his blood sugars better controlled" (pt-stated)       CARE PLAN ENTRY (see longtitudinal plan of care for additional care plan information)  Current Barriers:  . Diabetes: uncontrolled though improved, A1c 7.6% o Recent hospitalization for chest pain. D/c hydralazine, doxazosin. Wife reports that BP has been well controlled since coming home 120s/60s, but does not have readings written down. Patient and wife report that he is feeling well since discharge, with no recurrent of chest discomfort o Using adherence packing from [facility]. Wife confirms that they took the packs to pharmacy to have discontinued medications removed with recent medication changes  . Current antihyperglycemic regimen: Basaglar 56 units QAM, glipizide 5 mg QAM, metformin 1000 mg BID, Jardiance 25 mg  o APPROVED for Jardiance assistance through [employer] through 03/16/20 o APPROVED for [employer] assistance through [place] through 03/16/20 . Current meal patterns: o Wife confirms that he has been avoiding sodas and sweets . Current glucose readings:  o Patient has NOT been checking since he came home o Wife reports that patient sometimes wakes up overnight to snack, because he feels his blood sugar is low . Cardiovascular risk reduction (CAD s/p NSTEMI 1+ years ago, Followed by Dr. Gwen Pounds); LHC did not show areas amenable to PCI, medical management recommended; ECHO showed EF 50-55% o Current hypertensive regimen: enalapril 20 mg BID, metoprolol tartrate 25 mg BID; amlodipine 5 mg daily; wife reports SBP well controlled ~120s, but has not been writing down readings.  o Current hyperlipidemia regimen: atorvastatin 80 mg daily, ezetimibe 10 mg daily; LDL at goal <70  Pharmacist Clinical Goal(s):  [name] Over the next 90 days, patient with work with PharmD and primary care provider to address optimized medication  management  Interventions: . Comprehensive medication review performed, medication list updated in electronic medical record . Inter-disciplinary care team collaboration (see longitudinal plan of care) . Encouraged wife and patient to check BG and BP readings prior to f/u with PCP on Monday . Reviewed goal A1c, goal fasting, and goal 2 hour post prandial. . Discussed benefit of addition of GLP1, given the fact that A1c is not controlled, but patient reporting some symptoms of overnight hypoglycemia, so titration of Basaglar is limited. Recommend addition of Trulicity. Patient can receive for free through [facility] patient assistance, we would just need to send a prescription page. Will collaborate w/ PCP on this  . Will continue to support adherence, given need to maximize medical management of risk factor reduction  Patient Self Care Activities:  . Patient will check blood glucose BID , document, and provide at future appointments . Patient will take medications as prescribed . Patient will report any questions or concerns to provider   Please see past updates related to this goal by clicking on the "Past Updates" button in the selected goal          Patient verbalizes understanding of instructions provided today.   Plan:  - Scheduled f/u call in ~10 weeks  Catie Feliz Beam, PharmD, [facility] Clinical Pharmacist [facility] [phone]

## 2019-07-25 ENCOUNTER — Ambulatory Visit (INDEPENDENT_AMBULATORY_CARE_PROVIDER_SITE_OTHER): Payer: Medicare HMO | Admitting: Family Medicine

## 2019-07-25 ENCOUNTER — Other Ambulatory Visit: Payer: Self-pay

## 2019-07-25 ENCOUNTER — Encounter: Payer: Self-pay | Admitting: Family Medicine

## 2019-07-25 VITALS — BP 131/69 | HR 63 | Temp 98.5°F | Ht 65.91 in | Wt 207.0 lb

## 2019-07-25 DIAGNOSIS — N400 Enlarged prostate without lower urinary tract symptoms: Secondary | ICD-10-CM

## 2019-07-25 DIAGNOSIS — I10 Essential (primary) hypertension: Secondary | ICD-10-CM | POA: Diagnosis not present

## 2019-07-25 DIAGNOSIS — J449 Chronic obstructive pulmonary disease, unspecified: Secondary | ICD-10-CM | POA: Diagnosis not present

## 2019-07-25 DIAGNOSIS — I25810 Atherosclerosis of coronary artery bypass graft(s) without angina pectoris: Secondary | ICD-10-CM | POA: Diagnosis not present

## 2019-07-25 DIAGNOSIS — E1165 Type 2 diabetes mellitus with hyperglycemia: Secondary | ICD-10-CM | POA: Diagnosis not present

## 2019-07-25 DIAGNOSIS — I214 Non-ST elevation (NSTEMI) myocardial infarction: Secondary | ICD-10-CM | POA: Diagnosis not present

## 2019-07-25 DIAGNOSIS — E1129 Type 2 diabetes mellitus with other diabetic kidney complication: Secondary | ICD-10-CM

## 2019-07-25 DIAGNOSIS — IMO0002 Reserved for concepts with insufficient information to code with codable children: Secondary | ICD-10-CM

## 2019-07-25 LAB — UA/M W/RFLX CULTURE, ROUTINE
Bilirubin, UA: NEGATIVE
Ketones, UA: NEGATIVE
Leukocytes,UA: NEGATIVE
Nitrite, UA: NEGATIVE
Protein,UA: NEGATIVE
RBC, UA: NEGATIVE
Specific Gravity, UA: 1.01 (ref 1.005–1.030)
Urobilinogen, Ur: 2 mg/dL — ABNORMAL HIGH (ref 0.2–1.0)
pH, UA: 5.5 (ref 5.0–7.5)

## 2019-07-25 MED ORDER — BASAGLAR KWIKPEN 100 UNIT/ML ~~LOC~~ SOPN: PEN_INJECTOR | SUBCUTANEOUS | 1 refills | Status: DC

## 2019-07-25 NOTE — Progress Notes (Signed)
BP 131/69   Pulse 63   Temp 98.5 F (36.9 C)   Ht 5' 5.91" (1.674 m)   Wt 207 lb (93.9 kg)   SpO2 93%   BMI 33.51 kg/m    Subjective:    Patient ID: Benjamin Chan, male    DOB: 09-09-1945, 74 y.o.   MRN: [id number]  HPI: Benjamin Chan is a 74 y.o. male  Chief Complaint  Patient presents with  . Hospitalization Follow-up    Heart attack   Presenting today for hospital f/u for NSTEMI, SOB, uncontrolled DM.   Underwent cardiac cath during admission without need for stent placement. On plavix, high dose lipitor, zetia, metoprolol, and prn nitroglycerin which he has not taken since d/c. Still some SOB but improved from time of presentation to ED.   COPD - on trelegy and prn albuterol which seems to help. Per patient currently feels stable with regard to that.   Having poor urine stream initiation since getting off doxazosin at discharge from hospital. Still taking finasteride. Able to eventually void but notes it takes a while for stream to get going. Denies hematuria, dysuria, abdominal pain.   DM - BSs have been inconsistent, 110-260s range. Getting up all through the night eating due to low blood sugars. Taking 56 units of basaglar along with his other medications. Trying to watch diet. Does not exercise.   HTN - home BP readings consistently around 120-140/70s-80s. Taking medicines faithfully without side effect.   Transition of Care Hospital Follow up.   Hospital/Facility: [facility] D/C Physician: Dr. Marylu Lund D/C Date: 07/16/19  Records Requested: available  Records Received: immediately Records Reviewed: 07/25/19  Diagnoses on Discharge: NSTEMI, HTN, DM2  Date of interactive Contact within 48 hours of discharge: 07/18/19 Contact was through: phone  Date of 7 day or 14 day face-to-face visit:  07/25/19  within 14 days  Outpatient Encounter Medications as of 07/25/2019  Medication Sig Note  . acetaminophen (TYLENOL) 325 MG tablet Take 650 mg by mouth every 6 (six)  hours as needed.   [name] albuterol (PROVENTIL) (2.5 MG/3ML) 0.083% nebulizer solution Take 3 mLs (2.5 mg total) by nebulization every 6 (six) hours as needed for wheezing or shortness of breath.   [name] albuterol (VENTOLIN HFA) 108 (90 Base) MCG/ACT inhaler Inhale 2 puffs into the lungs every 6 (six) hours as needed for wheezing or shortness of breath. 05/25/2019: Using BID  . aspirin EC 81 MG tablet Take 1 tablet (81 mg total) by mouth daily.   [name] atorvastatin (LIPITOR) 80 MG tablet Take 1 tablet (80 mg total) by mouth daily.   [name] ezetimibe (ZETIA) 10 MG tablet Take 1 tablet (10 mg total) by mouth daily.   . finasteride (PROSCAR) 5 MG tablet Take 1 tablet (5 mg total) by mouth daily.   . fluticasone (FLONASE) 50 MCG/ACT nasal spray Place 1 spray into both nostrils 2 (two) times daily. Pt takes twice a day 05/25/2019: Using PRN  . Fluticasone-Umeclidin-Vilant (TRELEGY ELLIPTA) 100-62.5-25 MCG/INH AEPB Inhale 1 puff into the lungs daily.   [name] glipiZIDE (GLUCOTROL) 5 MG tablet Take 1 tablet (5 mg total) by mouth daily before breakfast.   . Insulin Glargine (BASAGLAR KWIKPEN) 100 UNIT/ML Inject 45-50 units daily at bedtime   . metFORMIN (GLUCOPHAGE-XR) 500 MG 24 hr tablet Take 2 tablets (1,000 mg total) by mouth 2 (two) times daily.   . metoprolol tartrate (LOPRESSOR) 25 MG tablet Take 1 tablet (25 mg total) by mouth 2 (two) times daily.   [name]  nitroGLYCERIN (NITROSTAT) 0.4 MG SL tablet Place 1 tablet (0.4 mg total) under the tongue every 5 (five) minutes as needed for chest pain.   . [DISCONTINUED] amLODipine (NORVASC) 10 MG tablet Take 0.5 tablets (5 mg total) by mouth daily.   . [DISCONTINUED] clopidogrel (PLAVIX) 75 MG tablet Take 1 tablet (75 mg total) by mouth daily with breakfast.   . [DISCONTINUED] empagliflozin (JARDIANCE) 25 MG TABS tablet Take 25 mg by mouth daily.   . [DISCONTINUED] enalapril (VASOTEC) 20 MG tablet Take 1 tablet (20 mg total) by mouth 2 (two) times daily.   . [DISCONTINUED] Insulin  Glargine (BASAGLAR KWIKPEN) 100 UNIT/ML SOPN Inject 0.56 mLs (56 Units total) into the skin daily.    No facility-administered encounter medications on file as of 07/25/2019.    Diagnostic Tests Reviewed/Disposition: labs, cardiac catheterization, ekg  Consults: Cardiology  Discharge Instructions: follow up with Cardiology, d/c doxazosin  Disease/illness Education: given  Home Health/Community Services Discussions/Referrals: n/a  Establishment or re-establishment of referral orders for community resources: n/a  Discussion with other health care providers: none  Assessment and Support of treatment regimen adherence: good  Appointments Coordinated with: Cardiology information given  Education for self-management, independent living, and ADLs: reviewed  Relevant past medical, surgical, family and social history reviewed and updated as indicated. Interim medical history since our last visit reviewed. Allergies and medications reviewed and updated.  Review of Systems  Per HPI unless specifically indicated above     Objective:    BP 131/69   Pulse 63   Temp 98.5 F (36.9 C)   Ht 5' 5.91" (1.674 m)   Wt 207 lb (93.9 kg)   SpO2 93%   BMI 33.51 kg/m   Wt Readings from Last 3 Encounters:  07/25/19 207 lb (93.9 kg)  07/16/19 206 lb 4.8 oz (93.6 kg)  07/11/19 206 lb (93.4 kg)    Physical Exam Vitals and nursing note reviewed.  Constitutional:      Appearance: Normal appearance.  HENT:     Head: Atraumatic.  Eyes:     Extraocular Movements: Extraocular movements intact.     Conjunctiva/sclera: Conjunctivae normal.  Cardiovascular:     Rate and Rhythm: Normal rate and regular rhythm.  Pulmonary:     Effort: Pulmonary effort is normal.     Breath sounds: Normal breath sounds.  Abdominal:     General: Bowel sounds are normal. There is no distension.     Palpations: Abdomen is soft.     Tenderness: There is no abdominal tenderness.  Musculoskeletal:        General:  Normal range of motion.     Cervical back: Normal range of motion and neck supple.  Skin:    General: Skin is warm and dry.  Neurological:     General: No focal deficit present.  Psychiatric:        Mood and Affect: Mood normal.        Thought Content: Thought content normal.        Judgment: Judgment normal.     Results for orders placed or performed in visit on 07/25/19  Comprehensive metabolic panel  Result Value Ref Range   Glucose 183 (H) 65 - 99 mg/dL   BUN 24 8 - 27 mg/dL   Creatinine, Ser 1.54 (H) 0.76 - 1.27 mg/dL   GFR calc non Af Amer 43 (L) >59 mL/min/1.73   GFR calc Af Amer 50 (L) >59 mL/min/1.73   BUN/Creatinine Ratio 16 10 - 24  Sodium 140 134 - 144 mmol/L   Potassium 4.6 3.5 - 5.2 mmol/L   Chloride 102 96 - 106 mmol/L   CO2 23 20 - 29 mmol/L   Calcium 9.3 8.6 - 10.2 mg/dL   Total Protein 6.8 6.0 - 8.5 g/dL   Albumin 3.8 3.7 - 4.7 g/dL   Globulin, Total 3.0 1.5 - 4.5 g/dL   Albumin/Globulin Ratio 1.3 1.2 - 2.2   Bilirubin Total 0.2 0.0 - 1.2 mg/dL   Alkaline Phosphatase 101 39 - 117 IU/L   AST 15 0 - 40 IU/L   ALT 21 0 - 44 IU/L  UA/M w/rflx Culture, Routine   Specimen: Urine   URINE  Result Value Ref Range   Specific Gravity, UA 1.010 1.005 - 1.030   pH, UA 5.5 5.0 - 7.5   Color, UA Yellow Yellow   Appearance Ur Clear Clear   Leukocytes,UA Negative Negative   Protein,UA Negative Negative/Trace   Glucose, UA 3+ (A) Negative   Ketones, UA Negative Negative   RBC, UA Negative Negative   Bilirubin, UA Negative Negative   Urobilinogen, Ur 2.0 (H) 0.2 - 1.0 mg/dL   Nitrite, UA Negative Negative  PSA  Result Value Ref Range   Prostate Specific Ag, Serum 0.3 0.0 - 4.0 ng/mL      Assessment & Plan:   Problem List Items Addressed This Visit      Cardiovascular and Mediastinum   Non-ST elevation myocardial infarction (NSTEMI), subendocardial infarction, subsequent episode of care [facility])    S/p cardiac catheterization without need for stent placement  during recent admission. 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 Will check labs/a1c and may elect to reduce basaglar dose and add GLP 1      Relevant Medications   Insulin Glargine (BASAGLAR KWIKPEN) 100 UNIT/ML   Other Relevant Orders   Comprehensive metabolic panel (Completed)     Genitourinary   BPH (benign prostatic hyperplasia)    Exacerbated since being off doxazosin, may want to discuss with specialist if appropriate to re-add. Unable to trial cialis for his LUTS sxs due to prn use of nitroglycerin. F/u with Urology if worsening, ED if unable to void entirely at any point. Check U/A today      Relevant Orders   UA/M w/rflx Culture, Routine (Completed)   PSA (Completed)       Follow up plan: Return in about 3 months (around 10/25/2019) for dm.

## 2019-07-25 NOTE — Patient Instructions (Signed)
[phone] - call Dr. America Brown office and set up a hospital follow up appointment.

## 2019-07-26 ENCOUNTER — Ambulatory Visit: Payer: Self-pay | Admitting: Pharmacist

## 2019-07-26 DIAGNOSIS — IMO0002 Reserved for concepts with insufficient information to code with codable children: Secondary | ICD-10-CM

## 2019-07-26 DIAGNOSIS — E1129 Type 2 diabetes mellitus with other diabetic kidney complication: Secondary | ICD-10-CM

## 2019-07-26 LAB — COMPREHENSIVE METABOLIC PANEL
ALT: 21 IU/L (ref 0–44)
AST: 15 IU/L (ref 0–40)
Albumin/Globulin Ratio: 1.3 (ref 1.2–2.2)
Albumin: 3.8 g/dL (ref 3.7–4.7)
Alkaline Phosphatase: 101 IU/L (ref 39–117)
BUN/Creatinine Ratio: 16 (ref 10–24)
BUN: 24 mg/dL (ref 8–27)
Bilirubin Total: 0.2 mg/dL (ref 0.0–1.2)
CO2: 23 mmol/L (ref 20–29)
Calcium: 9.3 mg/dL (ref 8.6–10.2)
Chloride: 102 mmol/L (ref 96–106)
Creatinine, Ser: 1.54 mg/dL — ABNORMAL HIGH (ref 0.76–1.27)
GFR calc Af Amer: 50 mL/min/{1.73_m2} — ABNORMAL LOW (ref >59)
GFR calc non Af Amer: 43 mL/min/{1.73_m2} — ABNORMAL LOW (ref >59)
Globulin, Total: 3 g/dL (ref 1.5–4.5)
Glucose: 183 mg/dL — ABNORMAL HIGH (ref 65–99)
Potassium: 4.6 mmol/L (ref 3.5–5.2)
Sodium: 140 mmol/L (ref 134–144)
Total Protein: 6.8 g/dL (ref 6.0–8.5)

## 2019-07-26 LAB — PSA: Prostate Specific Ag, Serum: 0.3 ng/mL (ref 0.0–4.0)

## 2019-07-26 NOTE — Chronic Care Management (AMB) (Signed)
Chronic Care Management   Follow Up Note   07/26/2019 Name: Benjamin Chan MRN: [id number] DOB: 1945-11-06  Referred by: Benjamin Nearing, PA-C Reason for referral : Chronic Care Management (Medication Management)   Benjamin Chan is a 74 y.o. year old male who is a primary care patient of Benjamin Chan, [place]. The CCM team was consulted for assistance with chronic disease management and care coordination needs.    Care coordination completed today.   Review of patient status, including review of consultants reports, relevant laboratory and other test results, and collaboration with appropriate care team members and the patient's provider was performed as part of comprehensive patient evaluation and provision of chronic care management services.    SDOH (Social Determinants of Health) assessments performed: Yes See Care Plan activities for detailed interventions related to Benjamin Chan)     Outpatient Encounter Medications as of 07/26/2019  Medication Sig Note  . acetaminophen (TYLENOL) 325 MG tablet Take 650 mg by mouth every 6 (six) hours as needed.   Benjamin Chan albuterol (PROVENTIL) (2.5 MG/3ML) 0.083% nebulizer solution Take 3 mLs (2.5 mg total) by nebulization every 6 (six) hours as needed for wheezing or shortness of breath.   Benjamin Chan albuterol (VENTOLIN HFA) 108 (90 Base) MCG/ACT inhaler Inhale 2 puffs into the lungs every 6 (six) hours as needed for wheezing or shortness of breath. 05/25/2019: Using BID  . amLODipine (NORVASC) 10 MG tablet Take 0.5 tablets (5 mg total) by mouth daily.   Benjamin Chan aspirin EC 81 MG tablet Take 1 tablet (81 mg total) by mouth daily.   Benjamin Chan atorvastatin (LIPITOR) 80 MG tablet Take 1 tablet (80 mg total) by mouth daily.   . clopidogrel (PLAVIX) 75 MG tablet Take 1 tablet (75 mg total) by mouth daily with breakfast.   . empagliflozin (JARDIANCE) 25 MG TABS tablet Take 25 mg by mouth daily.   . enalapril (VASOTEC) 20 MG tablet Take 1 tablet (20 mg total) by mouth 2  (two) times daily.   Benjamin Chan ezetimibe (ZETIA) 10 MG tablet Take 1 tablet (10 mg total) by mouth daily.   . finasteride (PROSCAR) 5 MG tablet Take 1 tablet (5 mg total) by mouth daily.   . fluticasone (FLONASE) 50 MCG/ACT nasal spray Place 1 spray into both nostrils 2 (two) times daily. Pt takes twice a day 05/25/2019: Using PRN  . Fluticasone-Umeclidin-Vilant (TRELEGY ELLIPTA) 100-62.5-25 MCG/INH AEPB Inhale 1 puff into the lungs daily.   Benjamin Chan glipiZIDE (GLUCOTROL) 5 MG tablet Take 1 tablet (5 mg total) by mouth daily before breakfast.   . Insulin Glargine (BASAGLAR KWIKPEN) 100 UNIT/ML Inject 45-50 units daily at bedtime   . metFORMIN (GLUCOPHAGE-XR) 500 MG 24 hr tablet Take 2 tablets (1,000 mg total) by mouth 2 (two) times daily.   . metoprolol tartrate (LOPRESSOR) 25 MG tablet Take 1 tablet (25 mg total) by mouth 2 (two) times daily.   . nitroGLYCERIN (NITROSTAT) 0.4 MG SL tablet Place 1 tablet (0.4 mg total) under the tongue every 5 (five) minutes as needed for chest pain.    No facility-administered encounter medications on file as of 07/26/2019.     Objective:   Goals Addressed            This Visit's Progress     Patient Stated   . "We want to keep his blood sugars better controlled" (pt-stated)       CARE PLAN ENTRY (see longtitudinal plan of care for additional care plan information)  Current Barriers:  .  Diabetes: uncontrolled though improved, A1c 7.6% o Received message from PCP in agreement w/ addition of Trulicity weekly to reduce insulin burden. . Current antihyperglycemic regimen: Basaglar 56 units QAM, glipizide 5 mg QAM, metformin 1000 mg BID, Jardiance 25 mg  o APPROVED for Jardiance assistance through [place] through 03/16/20 o APPROVED for [employer] assistance through [place] through 03/16/20 . Cardiovascular risk reduction (CAD s/p NSTEMI 1+ years ago, Followed by Dr. Nehemiah Massed); [place]la did not show areas amenable to PCI, medical management recommended; ECHO showed EF  50-55% o Current hypertensive regimen: enalapril 20 mg BID, metoprolol tartrate 25 mg BID; amlodipine 5 mg daily o Current hyperlipidemia regimen: atorvastatin 80 mg daily, ezetimibe 10 mg daily; LDL at goal <70  Pharmacist Clinical Goal(s):  Benjamin Chan Over the next 90 days, patient with work with PharmD and primary care provider to address optimized medication management  Interventions: . Inter-disciplinary care team collaboration (see longitudinal plan of care) . Will collaborate w/ PCP to send prescription portion of [employer] application for Trulicity into [employer] to add to patient's current approval for [employer].   Patient Self Care Activities:  . Patient will check blood glucose BID , document, and provide at future appointments . Patient will take medications as prescribed . Patient will report any questions or concerns to provider   Please see past updates related to this goal by clicking on the "Past Updates" button in the selected goal           Plan:  - Will collaborate w/ patient and provider as above  [name], PharmD, [place] [phone]

## 2019-07-26 NOTE — Patient Instructions (Signed)
Visit Information  Goals Addressed            This Visit's Progress     Patient Stated   . "We want to keep his blood sugars better controlled" (pt-stated)       CARE PLAN ENTRY (see longtitudinal plan of care for additional care plan information)  Current Barriers:  . Diabetes: uncontrolled though improved, A1c 7.6% o Received message from PCP in agreement w/ addition of Trulicity weekly to reduce insulin burden. . Current antihyperglycemic regimen: Basaglar 56 units QAM, glipizide 5 mg QAM, metformin 1000 mg BID, Jardiance 25 mg  o APPROVED for Jardiance assistance through [place] through 03/16/20 o APPROVED for [employer] assistance through [place] through 03/16/20 . Cardiovascular risk reduction (CAD s/p NSTEMI 1+ years ago, Followed by Dr. Gwen Pounds); LHC did not show areas amenable to PCI, medical management recommended; ECHO showed EF 50-55% o Current hypertensive regimen: enalapril 20 mg BID, metoprolol tartrate 25 mg BID; amlodipine 5 mg daily o Current hyperlipidemia regimen: atorvastatin 80 mg daily, ezetimibe 10 mg daily; LDL at goal <70  Pharmacist Clinical Goal(s):  [name] Over the next 90 days, patient with work with PharmD and primary care provider to address optimized medication management  Interventions: . Inter-disciplinary care team collaboration (see longitudinal plan of care) . Will collaborate w/ PCP to send prescription portion of [employer] application for Trulicity into [employer] to add to patient's current approval for [employer].   Patient Self Care Activities:  . Patient will check blood glucose BID , document, and provide at future appointments . Patient will take medications as prescribed . Patient will report any questions or concerns to provider   Please see past updates related to this goal by clicking on the "Past Updates" button in the selected goal          Patient verbalizes understanding of instructions provided today.      Plan:  - Will  collaborate w/ patient and provider as above  Catie Feliz Beam, PharmD, [facility] Clinical Pharmacist [facility] [phone]

## 2019-07-28 ENCOUNTER — Ambulatory Visit: Payer: Medicare HMO

## 2019-08-01 ENCOUNTER — Other Ambulatory Visit: Payer: Self-pay | Admitting: Family Medicine

## 2019-08-01 MED ORDER — ENALAPRIL MALEATE 20 MG PO TABS: 20.0000 mg | ORAL_TABLET | Freq: Two times a day (BID) | ORAL | 1 refills | Status: DC

## 2019-08-01 NOTE — Telephone Encounter (Signed)
Requested Prescriptions  Pending Prescriptions Disp Refills  . amLODipine (NORVASC) 10 MG tablet 30 tablet 0    Sig: Take 0.5 tablets (5 mg total) by mouth daily.     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 clopidogrel (PLAVIX) 75 MG tablet 30 tablet 0    Sig: Take 1 tablet (75 mg total) by mouth daily with breakfast.     Hematology: Antiplatelets - clopidogrel Failed - 08/01/2019 10:57 AM      Failed - Evaluate AST, ALT within 2 months of therapy initiation.      Passed - ALT in normal range and within 360 days    ALT  Date Value Ref Range Status  07/25/2019 21 0 - 44 IU/L Final         Passed - AST in normal range and within 360 days    AST  Date Value Ref Range Status  07/25/2019 15 0 - 40 IU/L Final         Passed - HCT in normal range and within 180 days    HCT  Date Value Ref Range Status  07/16/2019 41.8 39.0 - 52.0 % Final   Hematocrit  Date Value Ref Range Status  07/10/2017 39.5 37.5 - 51.0 % Final          Passed - HGB in normal range and within 180 days    Hemoglobin  Date Value Ref Range Status  07/16/2019 14.1 13.0 - 17.0 g/dL Final  07/10/2017 13.9 13.0 - 17.7 g/dL Final         Passed - PLT in normal range and within 180 days    Platelets  Date Value Ref Range Status  07/16/2019 294 150 - 400 K/uL Final  07/10/2017 330 150 - 379 x10E3/uL Final         Passed - Valid encounter within last 6 months    Recent Outpatient Visits          1 week ago DM (diabetes mellitus), type 2, uncontrolled, with renal complications [facility])   [facility] Volney American, PA-C   3 weeks ago 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 empagliflozin (JARDIANCE) 25 MG TABS tablet 30 tablet     Sig: Take 25 mg by mouth daily.     Endocrinology:  Diabetes - SGLT2 Inhibitors Failed - 08/01/2019 10:57 AM      Failed - Cr in normal range and within 360 days    Creatinine, Ser  Date Value Ref Range Status  07/25/2019 1.54 (H) 0.76 - 1.27 mg/dL Final         Failed - eGFR in normal range and within 360 days    GFR calc Af Amer  Date Value Ref Range Status  07/25/2019 50 (L) >59 mL/min/1.73 Final    Comment:    **[facility] currently reports eGFR in compliance with the current**   recommendations of the [employer]. [facility] will   update reporting as new guidelines are published from the NKF-ASN   Task force.    GFR  calc non Af Amer  Date Value Ref Range Status  07/25/2019 43 (L) >59 mL/min/1.73 Final         Passed - LDL in normal range and within 360 days    LDL Chol Calc (NIH)  Date Value Ref Range Status  06/27/2019 64 0 - 99 mg/dL Final   LDL Cholesterol  Date Value Ref Range Status  07/15/2019 64 0 - 99 mg/dL Final    Comment:           Total Cholesterol/HDL:CHD Risk Coronary Heart Disease Risk Table                     Men   Women  1/2 Average Risk   3.4   3.3  Average Risk       5.0   4.4  2 X Average Risk   9.6   7.1  3 X Average Risk  23.4   11.0        Use the calculated Patient Ratio above and the CHD Risk Table to determine the patient's CHD Risk.        ATP III CLASSIFICATION (LDL):  <100     mg/dL   Optimal  100-129  mg/dL   Near or Above                    Optimal  130-159  mg/dL   Borderline  160-189  mg/dL   High  >190     mg/dL   Very High Performed at [facility], [place], [place] [id number]          Passed - HBA1C is between 0 and 7.9 and within 180 days    HB A1C (BAYER DCA - WAIVED)  Date Value Ref Range Status  04/07/2018 11.4 (H) <7.0 % Final    Comment:                                          Diabetic Adult            <  7.0                                       Healthy Adult        4.3 - 5.7                                                           (DCCT/NGSP) American Diabetes Association's Summary of Glycemic Recommendations for Adults with Diabetes: Hemoglobin A1c <7.0%. More stringent glycemic goals (A1c <6.0%) may further reduce complications at the cost of increased risk of hypoglycemia.    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 enalapril (VASOTEC) 20 MG tablet 90 tablet 1    Sig: Take 1 tablet (20 mg total) by mouth 2 (two) times daily.     Cardiovascular:  ACE Inhibitors Failed - 08/01/2019 10:57 AM      Failed - Cr in normal range and within 180 days    Creatinine, Ser  Date Value Ref Range Status  07/25/2019 1.54 (H) 0.76 - 1.27 mg/dL Final         Passed - K in normal range and within 180 days    Potassium  Date Value Ref Range Status  07/25/2019 4.6 3.5 - 5.2 mmol/L Final         Passed - Patient is not pregnant      Passed - Last BP in normal range    BP Readings from Last 1 Encounters:  07/25/19 131/69         Passed - Valid encounter within last 6 months    Recent Outpatient Visits          1 week ago DM (diabetes mellitus), type 2, uncontrolled, with renal complications [facility])   [place], [place], PA-C   3 weeks ago 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 classification ([place])  [place], [place], [place]   1 month ago Hypertension associated with diabetes [facility])   [place], [place], [place]   4 months ago Colon cancer screening   [place], [place], [place]   5  months ago Benign prostatic hyperplasia, unspecified whether lower urinary tract symptoms present   [facility], Lilia Argue, [place]      Future Appointments            In 2 months Orene Desanctis, Lilia Argue, [place], [facility]           . metoprolol tartrate (LOPRESSOR) 25 MG tablet 180 tablet 1    Sig: Take 1 tablet (25 mg total) by mouth 2 (two) times daily.     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 Merrie Roof [place], [place]   5 months ago Benign prostatic hyperplasia, unspecified whether lower urinary tract symptoms present   [facility], Lilia Argue, [place]      Future Appointments            In 2 months Orene Desanctis, Lilia Argue, [place], [place]

## 2019-08-01 NOTE — Telephone Encounter (Signed)
Routing to provider  

## 2019-08-01 NOTE — Telephone Encounter (Signed)
Requested medication (s) are due for refill today: Amlodipine,  yes  Requested medication (s) are on the active medication list: yes  Last refill: 07/14/19  Future visit scheduled: yes Notes to clinic:  refilled per Dr Nolberto Hanlon  Requested medication (s) are due for refill today: Clopidogrel, yes  Requested medication (s) are on the active medication list: yes  Last refill:  07/16/19  Future visit scheduled: yes  Notes to clinic: refilled per Dr Nolberto Hanlon  Requested medication (s) are due for refill today: Empagliflozine  Requested medication (s) are on the active medication list:yes  Last refill: 07/22/19  Future visit scheduled: yes  Notes to clinic:  historical provider  Requested Prescriptions  Pending Prescriptions Disp Refills   amLODipine (NORVASC) 10 MG tablet 30 tablet 0    Sig: Take 0.5 tablets (5 mg total) by mouth daily.      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 [facility])   [facility] Volney American, [place]   1 month ago Hypertension associated with diabetes [facility])   [facility] Volney American, [place]   4 months ago Colon cancer screening   [place], [place], [place]   5 months ago Benign prostatic hyperplasia, unspecified whether lower urinary tract symptoms present   [facility], Lilia Argue, [place]       Future Appointments             In 2 months Orene Desanctis, Lilia Argue, [place], [facility]              clopidogrel (PLAVIX) 75  MG tablet 30 tablet 0    Sig: Take 1 tablet (75 mg total) by mouth daily with breakfast.      Hematology: Antiplatelets - clopidogrel Failed - 08/01/2019 10:57 AM      Failed - Evaluate AST, ALT within 2 months of therapy initiation.      Passed - ALT in normal range and within 360 days    ALT  Date Value Ref Range Status  07/25/2019 21 0 - 44 IU/L Final          Passed - AST in normal range and within 360 days    AST  Date Value Ref Range Status  07/25/2019 15 0 - 40 IU/L Final          Passed - HCT in normal range and within 180 days    HCT  Date Value Ref Range Status  07/16/2019 41.8 39.0 - 52.0 % Final   Hematocrit  Date Value Ref Range Status  07/10/2017 39.5 37.5 - 51.0 % Final          Passed - HGB in normal range and within 180 days    Hemoglobin  Date Value Ref Range Status  07/16/2019 14.1 13.0 - 17.0 g/dL Final  07/10/2017 13.9 13.0 - 17.7 g/dL Final  Passed - PLT in normal range and within 180 days    Platelets  Date Value Ref Range Status  07/16/2019 294 150 - 400 K/uL Final  07/10/2017 330 150 - 379 x10E3/uL Final          Passed - Valid encounter within last 6 months    Recent Outpatient Visits           1 week ago DM (diabetes mellitus), type 2, uncontrolled, with renal complications [facility])   [facility], PA-C   3 weeks ago 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 classification [facility])   [facility] Volney American, [place]   1 month ago Hypertension associated with diabetes [facility])   [facility] Volney American, [place]   4 months ago Colon cancer screening   [place], [place], [place]   5 months ago Benign prostatic hyperplasia, unspecified whether lower urinary tract symptoms present   [facility], Lilia Argue, [place]       Future Appointments             In 2 months Orene Desanctis, Lilia Argue, [place],  [facility]              empagliflozin (JARDIANCE) 25 MG TABS tablet 30 tablet     Sig: Take 25 mg by mouth daily.      Endocrinology:  Diabetes - SGLT2 Inhibitors Failed - 08/01/2019 10:57 AM      Failed - Cr in normal range and within 360 days    Creatinine, Ser  Date Value Ref Range Status  07/25/2019 1.54 (H) 0.76 - 1.27 mg/dL Final          Failed - eGFR in normal range and within 360 days    GFR calc Af Amer  Date Value Ref Range Status  07/25/2019 50 (L) >59 mL/min/1.73 Final    Comment:    **[facility] currently reports eGFR in compliance with the current**   recommendations of the [employer]. [facility] will   update reporting as new guidelines are published from the NKF-ASN   Task force.    GFR calc non Af Amer  Date Value Ref Range Status  07/25/2019 43 (L) >59 mL/min/1.73 Final          Passed - LDL in normal range and within 360 days    LDL Chol Calc (NIH)  Date Value Ref Range Status  06/27/2019 64 0 - 99 mg/dL Final   LDL Cholesterol  Date Value Ref Range Status  07/15/2019 64 0 - 99 mg/dL Final    Comment:           Total Cholesterol/HDL:CHD Risk Coronary Heart Disease Risk Table                     Men   Women  1/2 Average Risk   3.4   3.3  Average Risk       5.0   4.4  2 X Average Risk   9.6   7.1  3 X Average Risk  23.4   11.0        Use the calculated Patient Ratio above and the CHD Risk Table to determine the patient's CHD Risk.        ATP III CLASSIFICATION (LDL):  <100     mg/dL   Optimal  100-129  mg/dL   Near or Above  Optimal  130-159  mg/dL   Borderline  160-189  mg/dL   High  >190     mg/dL   Very High Performed at [facility], [place], [place] [address]           Passed - HBA1C is between 0 and 7.9 and within 180 days    HB A1C (BAYER DCA - WAIVED)  Date Value Ref Range Status  04/07/2018 11.4 (H) <7.0 % Final    Comment:                                           Diabetic Adult            <7.0                                       Healthy Adult        4.3 - 5.7                                                           (DCCT/NGSP) American Diabetes Association's Summary of Glycemic Recommendations for Adults with Diabetes: Hemoglobin A1c <7.0%. More stringent glycemic goals (A1c <6.0%) may further reduce complications at the cost of increased risk of hypoglycemia.    Hgb A1c MFr Bld  Date Value Ref Range Status  06/27/2019 7.6 (H) 4.8 - 5.6 % Final    Comment:             Prediabetes: 5.7 - 6.4          Diabetes: >6.4          Glycemic control for adults with diabetes: <7.0           Passed - Valid encounter within last 6 months    Recent Outpatient Visits           1 week ago DM (diabetes mellitus), type 2, uncontrolled, with renal complications [facility])   [facility] Volney [facility], PA-C   3 weeks ago 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 classification [facility])   [facility] Volney American, [place]   1 month ago Hypertension associated with diabetes [facility])   [facility] Volney American, [place]   4 months ago Colon cancer screening   [place], [place], [place]   5 months ago Benign prostatic hyperplasia, unspecified whether lower urinary tract symptoms present   [facility], Lilia Argue, [place]       Future Appointments             In 2 months Orene Desanctis, Lilia Argue, [place], [facility]             Signed Prescriptions Disp Refills   enalapril (VASOTEC) 20 MG tablet 90 tablet 1    Sig: Take 1 tablet (20 mg total) by mouth 2 (two) times daily.      Cardiovascular:  ACE Inhibitors Failed - 08/01/2019 10:57 AM      Failed - Cr in normal range and within 180 days    Creatinine, Ser  Date Value  Ref Range Status  07/25/2019 1.54 (H) 0.76 - 1.27 mg/dL Final          Passed - K in normal range and within 180 days     Potassium  Date Value Ref Range Status  07/25/2019 4.6 3.5 - 5.2 mmol/L Final          Passed - Patient is not pregnant      Passed - Last BP in normal range    BP Readings from Last 1 Encounters:  07/25/19 131/69          Passed - Valid encounter within last 6 months    Recent Outpatient Visits           1 week ago DM (diabetes mellitus), type 2, uncontrolled, with renal complications [facility])   [facility] Volney American, PA-C   3 weeks ago 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 classification [facility])   [facility] Volney American, [place]   1 month ago Hypertension associated with diabetes [facility])   [facility] Volney American, [place]   4 months ago Colon cancer screening   [place], [place], [place]   5 months ago Benign prostatic hyperplasia, unspecified whether lower urinary tract symptoms present   [facility], Lilia Argue, [place]       Future Appointments             In 2 months Orene Desanctis, Lilia Argue, [place], [facility]             Refused Prescriptions Disp Refills   metoprolol tartrate (LOPRESSOR) 25 MG tablet 180 tablet 1    Sig: Take 1 tablet (25 mg total) by mouth 2 (two) times daily.      Cardiovascular:  Beta Blockers Passed - 08/01/2019 10:57 AM      Passed - Last BP in normal range    BP Readings from Last 1 Encounters:  07/25/19 131/69          Passed - Last Heart Rate in normal range    Pulse Readings from Last 1 Encounters:  07/25/19 63          Passed - Valid encounter within last 6 months    Recent Outpatient Visits           1 week ago DM (diabetes mellitus), type 2, uncontrolled, with renal complications [facility])   [facility], PA-C   3 weeks ago 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 classification [facility])   [facility] Volney American, [place]   1 month ago Hypertension associated with  diabetes [facility])   [facility] Vol[facility], [place]   4 months ago Colon cancer screening   [facility] Merrie Roof [place], [place]   5 months ago Benign prostatic hyperplasia, unspecified whether lower urinary tract symptoms present   [facility], Lilia Argue, [place]       Future Appointments             In 2 months Orene Desanctis, Lilia Argue, [place], [place]

## 2019-08-01 NOTE — Telephone Encounter (Signed)
RX RFILL metoprolol tartrate (LOPRESSOR) 25 MG tablet  enalapril (VASOTEC) 20 MG tablet [[id number]] amLODipine (NORVASC) 10 MG tablet [[id number]]  clopidogrel (PLAVIX) 75 MG tablet  empagliflozin (JARDIANCE) 25 MG TABS tablet [[id number]]

## 2019-08-02 MED ORDER — AMLODIPINE BESYLATE 10 MG PO TABS: 5.0000 mg | ORAL_TABLET | Freq: Every day | ORAL | 0 refills | Status: DC

## 2019-08-02 MED ORDER — CLOPIDOGREL BISULFATE 75 MG PO TABS: 75.0000 mg | ORAL_TABLET | Freq: Every day | ORAL | 0 refills | Status: DC

## 2019-08-02 MED ORDER — JARDIANCE 25 MG PO TABS: 25.0000 mg | ORAL_TABLET | Freq: Every day | ORAL | 1 refills | Status: DC

## 2019-08-02 NOTE — Assessment & Plan Note (Signed)
S/p cardiac catheterization without need for stent placement during recent admission. Continue aggressive med regimen for risk factor reduction, follow up with Cardiology as scheduled

## 2019-08-02 NOTE — Assessment & Plan Note (Signed)
Overall stable and well controlled, continue current regimen with close monitoring

## 2019-08-02 NOTE — Assessment & Plan Note (Signed)
Stable, continue inhaler regimen 

## 2019-08-02 NOTE — Assessment & Plan Note (Signed)
Labile BSs with low spells in the night. Will check labs/a1c and may elect to reduce basaglar dose and add GLP 1

## 2019-08-02 NOTE — Assessment & Plan Note (Signed)
Exacerbated since being off doxazosin, may want to discuss with specialist if appropriate to re-add. Unable to trial cialis for his LUTS sxs due to prn use of nitroglycerin. F/u with Urology if worsening, ED if unable to void entirely at any point. Check U/A today

## 2019-08-02 NOTE — Assessment & Plan Note (Signed)
Continue current regimen per Cardiology

## 2019-08-04 ENCOUNTER — Other Ambulatory Visit: Payer: Self-pay | Admitting: Family Medicine

## 2019-08-04 MED ORDER — OZEMPIC (0.25 OR 0.5 MG/DOSE) 2 MG/1.5ML ~~LOC~~ SOPN: 0.5000 mg | PEN_INJECTOR | SUBCUTANEOUS | 2 refills | Status: DC

## 2019-09-21 ENCOUNTER — Ambulatory Visit (INDEPENDENT_AMBULATORY_CARE_PROVIDER_SITE_OTHER): Payer: Medicare HMO | Admitting: Pharmacist

## 2019-09-21 ENCOUNTER — Other Ambulatory Visit: Payer: Self-pay | Admitting: Family Medicine

## 2019-09-21 DIAGNOSIS — I252 Old myocardial infarction: Secondary | ICD-10-CM

## 2019-09-21 DIAGNOSIS — E1129 Type 2 diabetes mellitus with other diabetic kidney complication: Secondary | ICD-10-CM

## 2019-09-21 DIAGNOSIS — N400 Enlarged prostate without lower urinary tract symptoms: Secondary | ICD-10-CM

## 2019-09-21 DIAGNOSIS — IMO0002 Reserved for concepts with insufficient information to code with codable children: Secondary | ICD-10-CM

## 2019-09-21 MED ORDER — BASAGLAR KWIKPEN 100 UNIT/ML ~~LOC~~ SOPN: PEN_INJECTOR | SUBCUTANEOUS | 1 refills | Status: DC

## 2019-09-21 NOTE — Chronic Care Management (AMB) (Signed)
Chronic Care Management   Follow Up Note   09/21/2019 Name: Benjamin Chan MRN: [id number] DOB: [birth year]  Referred by: [name], PA-C Reason for referral : Chronic Care Management (Medication Management)   Benjamin Chan is a 74 y.o. year old male who is a primary care patient of Particia Chan, [place]. The CCM team was consulted for assistance with chronic disease management and care coordination needs.    Contacted patient and his wife, Benjamin Chan, for medication management review.   Review of patient status, including review of consultants reports, relevant laboratory and other test results, and collaboration with appropriate care team members and the patient's provider was performed as part of comprehensive patient evaluation and provision of chronic care management services.    SDOH (Social Determinants of Health) assessments performed: Yes See Care Plan activities for detailed interventions related to SDOH)  SDOH Interventions     Most Recent Value  SDOH Interventions  Financial Strain Interventions Other (Comment)  [patient assistance evaluatoin]       Outpatient Encounter Medications as of 09/21/2019  Medication Sig Note  . albuterol (PROVENTIL) (2.5 MG/3ML) 0.083% nebulizer solution Take 3 mLs (2.5 mg total) by nebulization every 6 (six) hours as needed for wheezing or shortness of breath.   [name] amLODipine (NORVASC) 10 MG tablet Take 0.5 tablets (5 mg total) by mouth daily.   [name] aspirin EC 81 MG tablet Take 1 tablet (81 mg total) by mouth daily.   [name] atorvastatin (LIPITOR) 80 MG tablet Take 1 tablet (80 mg total) by mouth daily.   . clopidogrel (PLAVIX) 75 MG tablet Take 1 tablet (75 mg total) by mouth daily with breakfast.   . Dulaglutide (TRULICITY) 0.75 MG/0.5ML SOPN Inject 0.75 mg into the skin once a week.   . empagliflozin (JARDIANCE) 25 MG TABS tablet Take 25 mg by mouth daily.   . enalapril (VASOTEC) 20 MG tablet Take 1 tablet (20 mg total) by  mouth 2 (two) times daily.   [name] ezetimibe (ZETIA) 10 MG tablet Take 1 tablet (10 mg total) by mouth daily.   . finasteride (PROSCAR) 5 MG tablet Take 1 tablet (5 mg total) by mouth daily.   . fluticasone (FLONASE) 50 MCG/ACT nasal spray Place 1 spray into both nostrils 2 (two) times daily. Pt takes twice a day 05/25/2019: Using PRN  . Fluticasone-Umeclidin-Vilant (TRELEGY ELLIPTA) 100-62.5-25 MCG/INH AEPB Inhale 1 puff into the lungs daily.   [name] glipiZIDE (GLUCOTROL) 5 MG tablet Take 1 tablet (5 mg total) by mouth daily before breakfast.   . metFORMIN (GLUCOPHAGE-XR) 500 MG 24 hr tablet Take 2 tablets (1,000 mg total) by mouth 2 (two) times daily.   . metoprolol tartrate (LOPRESSOR) 25 MG tablet Take 1 tablet (25 mg total) by mouth 2 (two) times daily.   [name] acetaminophen (TYLENOL) 325 MG tablet Take 650 mg by mouth every 6 (six) hours as needed.   [name] albuterol (VENTOLIN HFA) 108 (90 Base) MCG/ACT inhaler Inhale 2 puffs into the lungs every 6 (six) hours as needed for wheezing or shortness of breath. (Patient not taking: Reported on 09/21/2019)   . nitroGLYCERIN (NITROSTAT) 0.4 MG SL tablet Place 1 tablet (0.4 mg total) under the tongue every 5 (five) minutes as needed for chest pain. (Patient not taking: Reported on 09/21/2019)   . [DISCONTINUED] Insulin Glargine (BASAGLAR KWIKPEN) 100 UNIT/ML Inject 45-50 units daily at bedtime (Patient not taking: Reported on 09/21/2019)   . [DISCONTINUED] Semaglutide,0.25 or 0.5MG /DOS, (OZEMPIC, 0.25 OR 0.5 MG/DOSE,)  2 MG/1.5ML SOPN Inject 0.5 mg into the skin once a week.    No facility-administered encounter medications on file as of 09/21/2019.     Objective:   Goals Addressed              This Visit's Progress     Patient Stated   .  "We want to keep his blood sugars better controlled" (pt-stated)        CARE PLAN ENTRY (see longtitudinal plan of care for additional care plan information)  Current Barriers:  . Diabetes: uncontrolled though improved, A1c  7.6% o Wife reports that since patient's last admission in April, he has been more tired/fatigued. She notes that they do check BG when he has "episodes" of weakness, but that readings are not low. Does not remember specific readings, and notes meter is not nearby . Current antihyperglycemic regimen: Basaglar 56 units QAM- patient reports he stopped when he started the Trulicity, as he thought it replaced the [employer]. Is completely out of Basaglar. glipizide 5 mg QAM, metformin 1000 mg BID, Jardiance 25 mg  o APPROVED for Jardiance assistance through [employer] through 03/16/20 o APPROVED for [employer] and Trulicity assistance through [employer] through 03/16/20 . Current glucose readings:  o Patient reports he has NOT been checking blood sugars  . Cardiovascular risk reduction (CAD s/p NSTEMI 1+ years ago, Followed by Dr. Gwen Pounds); LHC did not show areas amenable to PCI, medical management recommended; ECHO showed EF 50-55% o Current hypertensive regimen: enalapril 20 mg BID, metoprolol tartrate 25 mg BID; amlodipine 5 mg daily o Current hyperlipidemia regimen: atorvastatin 80 mg daily, ezetimibe 10 mg daily; LDL at goal <70 . COPD: Trelegy daily, albuterol HFA PRN. Reports that he needs to call [facility] to refill Trelegy . BPH: finasteride 5 mg daily   Pharmacist Clinical Goal(s):  [name] Over the next 90 days, patient with work with PharmD and primary care provider to address optimized medication management  Interventions: . Comprehensive medication review performed, medication list updated in electronic medical record . Inter-disciplinary care team collaboration (see longitudinal plan of care) . Reviewed that patient was to continue Basaglar and ADD Trulicity. They will call [name] to request refill on Basaglar. Also provided phone number for [name] - patient's wife will call to discuss that patient is out of Hospital doctor. They may be able to provide a free 30 day emergency supply,  while patient is waiting on shipment from [employer]. Collaborated w/ PCP to send script of Basaglar to [facility] for immediate use.  [name] Recommend patient reduce Basaglar to 40 units daily to reduce risk of hypoglycemia, as he was taking 56 units daily prior to start of Trulicity. Discussed this with patient and wife . Reviewed that he needs to be checking sugars BID, fasting and 2 hour post prandial, especially if he is having symptoms of "weakness". He and wife agree that he will restart checking sugars.  . Moving forward, assess total out of pocket spend to see if patient qualifies for [employer] assistance for Trelegy, or discuss switch to [employer] through [employer] assistance   Patient Self Care Activities:  . Patient will check blood glucose BID , document, and provide at future appointments . Patient will take medications as prescribed . Patient will report any questions or concerns to provider   Please see past updates related to this goal by clicking on the "Past Updates" button in the selected goal  Plan:  - Scheduled f/u phone call in ~2 days to ensure insulin was obtained  Catie Feliz Beam, PharmD, [facility] Clinical Pharmacist [facility] [phone]

## 2019-09-21 NOTE — Patient Instructions (Signed)
Visit Information  Goals Addressed              This Visit's Progress     Patient Stated   .  "We want to keep his blood sugars better controlled" (pt-stated)        CARE PLAN ENTRY (see longtitudinal plan of care for additional care plan information)  Current Barriers:  . Diabetes: uncontrolled though improved, A1c 7.6% o Wife reports that since patient's last admission in April, he has been more tired/fatigued. She notes that they do check BG when he has "episodes" of weakness, but that readings are not low. Does not remember specific readings, and notes meter is not nearby . Current antihyperglycemic regimen: Basaglar 56 units QAM- patient reports he stopped when he started the Trulicity, as he thought it replaced the [employer]. Is completely out of Basaglar. glipizide 5 mg QAM, metformin 1000 mg BID, Jardiance 25 mg  o APPROVED for Jardiance assistance through [employer] through 03/16/20 o APPROVED for [employer] and Trulicity assistance through [place] through 03/16/20 . Current glucose readings:  o Patient reports he has NOT been checking blood sugars  . Cardiovascular risk reduction (CAD s/p NSTEMI 1+ years ago, Followed by Dr. Gwen Pounds); LHC did not show areas amenable to PCI, medical management recommended; ECHO showed EF 50-55% o Current hypertensive regimen: enalapril 20 mg BID, metoprolol tartrate 25 mg BID; amlodipine 5 mg daily o Current hyperlipidemia regimen: atorvastatin 80 mg daily, ezetimibe 10 mg daily; LDL at goal <70 . COPD: Trelegy daily, albuterol HFA PRN. Reports that he needs to call [facility] to refill Trelegy . BPH: finasteride 5 mg daily   Pharmacist Clinical Goal(s):  [name] Over the next 90 days, patient with work with PharmD and primary care provider to address optimized medication management  Interventions: . Comprehensive medication review performed, medication list updated in electronic medical record . Inter-disciplinary care team collaboration (see longitudinal  plan of care) . Reviewed that patient was to continue Basaglar and ADD Trulicity. They will call [name] to request refill on Basaglar. Also provided phone number for [name] - patient's wife will call to discuss that patient is out of Hospital doctor. They may be able to provide a free 30 day emergency supply, while patient is waiting on shipment from [employer]. Collaborated w/ PCP to send script of Basaglar to [facility] for immediate use.  [name] Recommend patient reduce Basaglar to 40 units daily to reduce risk of hypoglycemia, as he was taking 56 units daily prior to start of Trulicity. Discussed this with patient and wife . Reviewed that he needs to be checking sugars BID, fasting and 2 hour post prandial, especially if he is having symptoms of "weakness". He and wife agree that he will restart checking sugars.  . Moving forward, assess total out of pocket spend to see if patient qualifies for [employer] assistance for Trelegy, or discuss switch to [employer] through [employer] assistance   Patient Self Care Activities:  . Patient will check blood glucose BID , document, and provide at future appointments . Patient will take medications as prescribed . Patient will report any questions or concerns to provider   Please see past updates related to this goal by clicking on the "Past Updates" button in the selected goal          The patient verbalized understanding of instructions provided today and declined a print copy of patient instruction materials.   Plan:  - Scheduled f/u phone call in ~  2 days to ensure insulin was obtained  Catie Feliz Beam, PharmD, [facility] Clinical Pharmacist [facility] [phone]

## 2019-09-22 ENCOUNTER — Other Ambulatory Visit: Payer: Self-pay | Admitting: Family Medicine

## 2019-09-22 MED ORDER — CLOPIDOGREL BISULFATE 75 MG PO TABS: 75.0000 mg | ORAL_TABLET | Freq: Every day | ORAL | 0 refills | Status: DC

## 2019-09-22 MED ORDER — METOPROLOL TARTRATE 25 MG PO TABS: 25.0000 mg | ORAL_TABLET | Freq: Two times a day (BID) | ORAL | 1 refills | Status: DC

## 2019-09-22 MED ORDER — ATORVASTATIN CALCIUM 80 MG PO TABS: 80.0000 mg | ORAL_TABLET | Freq: Every day | ORAL | 3 refills | Status: DC

## 2019-09-22 MED ORDER — FINASTERIDE 5 MG PO TABS: 5.0000 mg | ORAL_TABLET | Freq: Every day | ORAL | 1 refills | Status: DC

## 2019-09-22 MED ORDER — GLIPIZIDE 5 MG PO TABS: 5.0000 mg | ORAL_TABLET | Freq: Every day | ORAL | 1 refills | Status: DC

## 2019-09-22 MED ORDER — AMLODIPINE BESYLATE 10 MG PO TABS: 5.0000 mg | ORAL_TABLET | Freq: Every day | ORAL | 0 refills | Status: DC

## 2019-09-22 MED ORDER — ENALAPRIL MALEATE 20 MG PO TABS: 20.0000 mg | ORAL_TABLET | Freq: Two times a day (BID) | ORAL | 1 refills | Status: DC

## 2019-09-22 NOTE — Telephone Encounter (Signed)
Medication Refill - Medication: metoprolol tartrate (LOPRESSOR) 25 MG tablet,clopidogrel (PLAVIX) 75 MG tablet, amLODipine (NORVASC) 10 MG tablet,enalapril (VASOTEC) 20 MG tablet ,atorvastatin (LIPITOR) 80 MG tablet, glipiZIDE (GLUCOTROL) 5 MG tablet ,finasteride (PROSCAR) 5 MG tablet   Has the patient contacted their pharmacy? yes (Agent: If no, request that the patient contact the pharmacy for the refill.) (Agent: If yes, when and what did the pharmacy advise?)Contact PCP  Preferred Pharmacy (with phone number or street name):  [facility] [place], [place] - [address] [place] Phone:  [phone]  Fax:  [phone]       Agent: Please be advised that RX refills may take up to 3 business days. We ask that you follow-up with your pharmacy.

## 2019-09-23 ENCOUNTER — Telehealth: Payer: Self-pay

## 2019-09-23 ENCOUNTER — Ambulatory Visit: Payer: Medicare HMO | Admitting: Family Medicine

## 2019-09-23 ENCOUNTER — Ambulatory Visit: Payer: Medicare HMO

## 2019-09-23 ENCOUNTER — Telehealth: Payer: Self-pay | Admitting: Family Medicine

## 2019-09-23 ENCOUNTER — Ambulatory Visit: Payer: Self-pay | Admitting: Pharmacist

## 2019-09-23 DIAGNOSIS — N400 Enlarged prostate without lower urinary tract symptoms: Secondary | ICD-10-CM | POA: Diagnosis not present

## 2019-09-23 DIAGNOSIS — E1129 Type 2 diabetes mellitus with other diabetic kidney complication: Secondary | ICD-10-CM

## 2019-09-23 DIAGNOSIS — E1165 Type 2 diabetes mellitus with hyperglycemia: Secondary | ICD-10-CM

## 2019-09-23 DIAGNOSIS — I252 Old myocardial infarction: Secondary | ICD-10-CM

## 2019-09-23 DIAGNOSIS — IMO0002 Reserved for concepts with insufficient information to code with codable children: Secondary | ICD-10-CM

## 2019-09-23 NOTE — Telephone Encounter (Signed)
Called and spoke with patient's pharmacy, they need a PA on the Basaglar. They will send over the PA request.

## 2019-09-23 NOTE — Patient Instructions (Signed)
Visit Information  Goals Addressed              This Visit's Progress     Patient Stated   .  "We want to keep his blood sugars better controlled" (pt-stated)        CARE PLAN ENTRY (see longtitudinal plan of care for additional care plan information)  Current Barriers:  . Diabetes: uncontrolled though improved, A1c 7.6% o Following up on getting patient back on insulin regimen. Patient's wife was to call [name] to request refill on Basaglar, and then call [name] to request an immediate supply of Basaglar. Patient notes that she tried to request a refill but her call got cut off  . Current antihyperglycemic regimen: Basaglar 56 units QAM, glipizide 5 mg QAM, metformin 1000 mg BID, Jardiance 25 mg, Trulicity 0.75 mg weekly o APPROVED for Jardiance assistance through [employer] through 03/16/20 o APPROVED for [employer] and Trulicity assistance through [place] through 03/16/20 . Current glucose readings:  o Reports fastings of 226 and 202  . Cardiovascular risk reduction (CAD s/p NSTEMI 1+ years ago, Followed by Dr. Gwen Pounds); LHC did not show areas amenable to PCI, medical management recommended; ECHO showed EF 50-55% o Current hypertensive regimen: enalapril 20 mg BID, metoprolol tartrate 25 mg BID; amlodipine 5 mg daily o Current hyperlipidemia regimen: atorvastatin 80 mg daily, ezetimibe 10 mg daily; LDL at goal <70 . COPD: Trelegy daily, albuterol HFA PRN.  . BPH: finasteride 5 mg daily   Pharmacist Clinical Goal(s):  [name] Over the next 90 days, patient with work with PharmD and primary care provider to address optimized medication management  Interventions: . Comprehensive medication review performed, medication list updated in electronic medical record . Inter-disciplinary care team collaboration (see longitudinal plan of care) . Completed a three way call with patient's wife and [facility]. Unfortunately, since he has approval for Basaglar through [employer], they will not provide an immediate supply. They noted they would request his Basaglar refill be expedited.  Geradine Girt [name] to request an expedited refill. The company will be able to ship today to have delivered tomorrow. Notified patient's wife. Reiterated recommendation to restart w/ Basaglar 40 units daily (dose reduction d/t now being on Trulicity). She verbalized understanding.   Patient Self Care Activities:  . Patient will check blood glucose BID , document, and provide at future appointments . Patient will take medications as prescribed . Patient will report any questions or concerns to provider   Please see past updates related to this goal by clicking on the "Past Updates" button in the selected goal          The patient verbalized understanding of instructions provided today and declined a print copy of patient instruction materials.    Plan:  - Will f/u in ~2 weeks on BG   Catie Feliz Beam, PharmD, [facility] Clinical Pharmacist [facility] Practice/[facility] [phone]

## 2019-09-23 NOTE — Chronic Care Management (AMB) (Signed)
Chronic Care Management   Follow Up Note   09/23/2019 Name: ARISTOTELIS VILARDI MRN: [id number] DOB: 1945/09/17  Referred by: [name], PA-C Reason for referral : Chronic Care Management (Medication Management)   ASHVIK GRUNDMAN is a 74 y.o. year old male who is a primary care patient of [name], [place]. The CCM team was consulted for assistance with chronic disease management and care coordination needs.    Care coordination completed today   Review of patient status, including review of consultants reports, relevant laboratory and other test results, and collaboration with appropriate care team members and the patient's provider was performed as part of comprehensive patient evaluation and provision of chronic care management services.    SDOH (Social Determinants of Health) assessments performed: Yes See Care Plan activities for detailed interventions related to SDOH)  SDOH Interventions     Most Recent Value  SDOH Interventions  Financial Strain Interventions Other (Comment)  [medication assistance]       Outpatient Encounter Medications as of 09/23/2019  Medication Sig Note   acetaminophen (TYLENOL) 325 MG tablet Take 650 mg by mouth every 6 (six) hours as needed.    albuterol (PROVENTIL) (2.5 MG/3ML) 0.083% nebulizer solution Take 3 mLs (2.5 mg total) by nebulization every 6 (six) hours as needed for wheezing or shortness of breath.    albuterol (VENTOLIN HFA) 108 (90 Base) MCG/ACT inhaler Inhale 2 puffs into the lungs every 6 (six) hours as needed for wheezing or shortness of breath. (Patient not taking: Reported on 09/21/2019)    amLODipine (NORVASC) 10 MG tablet Take 0.5 tablets (5 mg total) by mouth daily.    aspirin EC 81 MG tablet Take 1 tablet (81 mg total) by mouth daily.    atorvastatin (LIPITOR) 80 MG tablet Take 1 tablet (80 mg total) by mouth daily.    clopidogrel (PLAVIX) 75 MG tablet Take 1 tablet (75 mg total) by mouth daily with  breakfast.    Dulaglutide (TRULICITY) 0.75 MG/0.5ML SOPN Inject 0.75 mg into the skin once a week.    empagliflozin (JARDIANCE) 25 MG TABS tablet Take 25 mg by mouth daily.    enalapril (VASOTEC) 20 MG tablet Take 1 tablet (20 mg total) by mouth 2 (two) times daily.    ezetimibe (ZETIA) 10 MG tablet Take 1 tablet (10 mg total) by mouth daily.    finasteride (PROSCAR) 5 MG tablet Take 1 tablet (5 mg total) by mouth daily.    fluticasone (FLONASE) 50 MCG/ACT nasal spray Place 1 spray into both nostrils 2 (two) times daily. Pt takes twice a day 05/25/2019: Using PRN   Fluticasone-Umeclidin-Vilant (TRELEGY ELLIPTA) 100-62.5-25 MCG/INH AEPB Inhale 1 puff into the lungs daily.    glipiZIDE (GLUCOTROL) 5 MG tablet Take 1 tablet (5 mg total) by mouth daily before breakfast.    Insulin Glargine (BASAGLAR KWIKPEN) 100 UNIT/ML Inject 45-50 units daily at bedtime    metFORMIN (GLUCOPHAGE-XR) 500 MG 24 hr tablet Take 2 tablets (1,000 mg total) by mouth 2 (two) times daily.    metoprolol tartrate (LOPRESSOR) 25 MG tablet Take 1 tablet (25 mg total) by mouth 2 (two) times daily.    nitroGLYCERIN (NITROSTAT) 0.4 MG SL tablet Place 1 tablet (0.4 mg total) under the tongue every 5 (five) minutes as needed for chest pain. (Patient not taking: Reported on 09/21/2019)    No facility-administered encounter medications on file as of 09/23/2019.     Objective:   Goals Addressed  This Visit's Progress     Patient Stated     "We want to keep his blood sugars better controlled" (pt-stated)        CARE PLAN ENTRY (see longtitudinal plan of care for additional care plan information)  Current Barriers:   Diabetes: uncontrolled though improved, A1c 7.6% o Following up on getting patient back on insulin regimen. Patient's wife was to call [name] to request refill on Basaglar, and then call [name] to request an immediate supply of Basaglar. Patient notes that she tried to  request a refill but her call got cut off   Current antihyperglycemic regimen: Basaglar 56 units QAM, glipizide 5 mg QAM, metformin 1000 mg BID, Jardiance 25 mg, Trulicity 0.75 mg weekly o APPROVED for Jardiance assistance through [employer] through 03/16/20 o APPROVED for [employer] and Trulicity assistance through [employer] through 03/16/20  Current glucose readings:  o Reports fastings of 226 and 202   Cardiovascular risk reduction (CAD s/p NSTEMI 1+ years ago, Followed by Dr. Gwen Pounds); LHC did not show areas amenable to PCI, medical management recommended; ECHO showed EF 50-55% o Current hypertensive regimen: enalapril 20 mg BID, metoprolol tartrate 25 mg BID; amlodipine 5 mg daily o Current hyperlipidemia regimen: atorvastatin 80 mg daily, ezetimibe 10 mg daily; LDL at goal <70  COPD: Trelegy daily, albuterol HFA PRN.   BPH: finasteride 5 mg daily   Pharmacist Clinical Goal(s):   Over the next 90 days, patient with work with PharmD and primary care provider to address optimized medication management  Interventions:  Comprehensive medication review performed, medication list updated in electronic medical record  Inter-disciplinary care team collaboration (see longitudinal plan of care)  Completed a three way call with patient's wife and [facility]. Unfortunately, since he has approval for Basaglar through [employer], they will not provide an immediate supply. They noted they would request his Basaglar refill be expedited.   Contacted [name] to request an expedited refill. The company will be able to ship today to have delivered tomorrow. Notified patient's wife. Reiterated recommendation to restart w/ Basaglar 40 units daily (dose reduction d/t now being on Trulicity). She verbalized understanding.   Patient Self Care Activities:   Patient will check blood glucose BID , document, and provide at future appointments  Patient will take medications as prescribed  Patient  will report any questions or concerns to provider   Please see past updates related to this goal by clicking on the "Past Updates" button in the selected goal           Plan:  - Will f/u in ~2 weeks on BG   Catie Feliz Beam, PharmD, [facility] Clinical Pharmacist [facility] Practice/[facility] [phone]

## 2019-09-23 NOTE — Telephone Encounter (Signed)
[name] with [facility] is calling and requesting the nurse to return her call concerning the patient insulin. Pt seen Benjamin Chan today

## 2019-09-23 NOTE — Telephone Encounter (Signed)
This nurse attempted to call patient in order to perform scheduled AWV. Person that answered phone stated that patient was not home. Message left for patient to call back in order to reschedule.

## 2019-09-26 ENCOUNTER — Ambulatory Visit: Payer: Medicare HMO | Admitting: Family Medicine

## 2019-09-26 NOTE — Telephone Encounter (Signed)
PA for Basaglar initiated and submitted via Cover My Meds. Key: [id number]  Preferred alternatives are Lantus, Levemir. Toujeo, Tresiba, or Victoza

## 2019-09-28 NOTE — Telephone Encounter (Signed)
PA for Basaglar denied. See alternatives listed below.

## 2019-09-30 MED ORDER — TRESIBA FLEXTOUCH 100 UNIT/ML ~~LOC~~ SOPN: 45.0000 [IU] | PEN_INJECTOR | Freq: Every day | SUBCUTANEOUS | 5 refills | Status: DC

## 2019-09-30 NOTE — Addendum Note (Signed)
Addended by: Roosvelt Maser E on: 09/30/2019 03:20 PM   Modules accepted: Orders

## 2019-09-30 NOTE — Telephone Encounter (Signed)
Sent in tresiba instead
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
## 2019-10-04 ENCOUNTER — Other Ambulatory Visit: Payer: Self-pay

## 2019-10-04 ENCOUNTER — Emergency Department
Admission: EM | Admit: 2019-10-04 | Discharge: 2019-10-04 | Disposition: A | Discharge status: Home / Self Care | Payer: Medicare HMO | Attending: Emergency Medicine | Admitting: Emergency Medicine

## 2019-10-04 ENCOUNTER — Emergency Department: Payer: Medicare HMO

## 2019-10-04 DIAGNOSIS — Z7982 Long term (current) use of aspirin: Secondary | ICD-10-CM | POA: Insufficient documentation

## 2019-10-04 DIAGNOSIS — E1122 Type 2 diabetes mellitus with diabetic chronic kidney disease: Secondary | ICD-10-CM | POA: Diagnosis not present

## 2019-10-04 DIAGNOSIS — M25512 Pain in left shoulder: Secondary | ICD-10-CM | POA: Diagnosis not present

## 2019-10-04 DIAGNOSIS — Y9389 Activity, other specified: Secondary | ICD-10-CM | POA: Insufficient documentation

## 2019-10-04 DIAGNOSIS — I252 Old myocardial infarction: Secondary | ICD-10-CM | POA: Diagnosis not present

## 2019-10-04 DIAGNOSIS — Y92410 Unspecified street and highway as the place of occurrence of the external cause: Secondary | ICD-10-CM | POA: Insufficient documentation

## 2019-10-04 DIAGNOSIS — Z79899 Other long term (current) drug therapy: Secondary | ICD-10-CM | POA: Insufficient documentation

## 2019-10-04 DIAGNOSIS — Y998 Other external cause status: Secondary | ICD-10-CM | POA: Diagnosis not present

## 2019-10-04 DIAGNOSIS — Z951 Presence of aortocoronary bypass graft: Secondary | ICD-10-CM | POA: Insufficient documentation

## 2019-10-04 DIAGNOSIS — Z794 Long term (current) use of insulin: Secondary | ICD-10-CM | POA: Diagnosis not present

## 2019-10-04 DIAGNOSIS — M19012 Primary osteoarthritis, left shoulder: Secondary | ICD-10-CM | POA: Diagnosis not present

## 2019-10-04 DIAGNOSIS — I129 Hypertensive chronic kidney disease with stage 1 through stage 4 chronic kidney disease, or unspecified chronic kidney disease: Secondary | ICD-10-CM | POA: Diagnosis not present

## 2019-10-04 DIAGNOSIS — M79602 Pain in left arm: Secondary | ICD-10-CM | POA: Diagnosis not present

## 2019-10-04 DIAGNOSIS — Z87891 Personal history of nicotine dependence: Secondary | ICD-10-CM | POA: Insufficient documentation

## 2019-10-04 DIAGNOSIS — N183 Chronic kidney disease, stage 3 unspecified: Secondary | ICD-10-CM | POA: Insufficient documentation

## 2019-10-04 DIAGNOSIS — J449 Chronic obstructive pulmonary disease, unspecified: Secondary | ICD-10-CM | POA: Insufficient documentation

## 2019-10-04 DIAGNOSIS — I2510 Atherosclerotic heart disease of native coronary artery without angina pectoris: Secondary | ICD-10-CM | POA: Insufficient documentation

## 2019-10-04 DIAGNOSIS — S4992XA Unspecified injury of left shoulder and upper arm, initial encounter: Secondary | ICD-10-CM | POA: Diagnosis not present

## 2019-10-04 MED ORDER — TRAMADOL HCL 50 MG PO TABS: 50.0000 mg | ORAL_TABLET | Freq: Three times a day (TID) | ORAL | 0 refills | Status: DC | PRN
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
## 2019-10-04 NOTE — Discharge Instructions (Addendum)
Follow-up with your primary care provider if any continued problems or concerns.  You may use ice to your shoulder as needed for discomfort.  A prescription for pain medication was sent to your pharmacy.  Be aware that if you take this medicine it could cause drowsiness and increase your risk for falling.  Do not drive or operate machinery while taking this medication.  Your x-rays were negative for fractures.
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
## 2019-10-13 ENCOUNTER — Other Ambulatory Visit: Payer: Self-pay | Admitting: Family Medicine

## 2019-10-21 ENCOUNTER — Ambulatory Visit (INDEPENDENT_AMBULATORY_CARE_PROVIDER_SITE_OTHER): Payer: Medicare HMO

## 2019-10-21 VITALS — Ht 66.0 in | Wt 210.0 lb

## 2019-10-21 DIAGNOSIS — Z0000 Encounter for general adult medical examination without abnormal findings: Secondary | ICD-10-CM

## 2019-10-21 NOTE — Progress Notes (Signed)
I connected with Benjamin Chan today by telephone and verified that I am speaking with the correct person using two identifiers. Location patient: home Location provider: work Persons participating in the virtual visit: Benjamin Chan, Elisha Ponder LPN.   I discussed the limitations, risks, security and privacy concerns of performing an evaluation and management service by telephone and the availability of in person appointments. I also discussed with the patient that there may be a patient responsible charge related to this service. The patient expressed understanding and verbally consented to this telephonic visit.    Interactive audio and video telecommunications were attempted between this provider and patient, however failed, due to patient having technical difficulties OR patient did not have access to video capability.  We continued and completed visit with audio only.  Vital signs may be patient reported or missing.    Subjective:   Benjamin Chan is a 74 y.o. male who presents for Medicare Annual/Subsequent preventive examination.  Review of Systems     Cardiac Risk Factors include: advanced age (>40men, >54 women);diabetes mellitus;hypertension;dyslipidemia;male gender;obesity (BMI >30kg/m2)     Objective:    Today's Vitals   10/21/19 0812  Weight: 210 lb (95.3 kg)  Height: 5\' 6"  (1.676 m)   Body mass index is 33.89 kg/m.  Advanced Directives 10/21/2019 10/04/2019 07/14/2019 05/02/2019 07/19/2018 07/10/2017 04/24/2017  Does Patient Have a Medical Advance Directive? No No No Yes No No No  Type of Advance Directive - - 06/22/2017;Living will - - -  Does patient want to make changes to medical advance directive? - - - No - Patient declined - - -  Copy of Healthcare Power of Attorney in Chart? - - - No - copy requested - - -  Would patient like information on creating a medical advance directive? - No - Patient declined No - Guardian declined - Yes  (MAU/Ambulatory/Procedural Areas - Information given) No - Patient declined No - Patient declined    Current Medications (verified) Outpatient Encounter Medications as of 10/21/2019  Medication Sig  . acetaminophen (TYLENOL) 325 MG tablet Take 650 mg by mouth every 6 (six) hours as needed.  12/21/2019 albuterol (PROVENTIL) (2.5 MG/3ML) 0.083% nebulizer solution Take 3 mLs (2.5 mg total) by nebulization every 6 (six) hours as needed for wheezing or shortness of breath.  [name] albuterol (VENTOLIN HFA) 108 (90 Base) MCG/ACT inhaler Inhale 2 puffs into the lungs every 6 (six) hours as needed for wheezing or shortness of breath.  [name] amLODipine (NORVASC) 10 MG tablet Take 0.5 tablets (5 mg total) by mouth daily.  [name] aspirin EC 81 MG tablet Take 1 tablet (81 mg total) by mouth daily.  [name] atorvastatin (LIPITOR) 80 MG tablet Take 1 tablet (80 mg total) by mouth daily.  . clopidogrel (PLAVIX) 75 MG tablet Take 1 tablet (75 mg total) by mouth daily with breakfast.  . Dulaglutide (TRULICITY) 0.75 MG/0.5ML SOPN Inject 0.75 mg into the skin once a week.  . empagliflozin (JARDIANCE) 25 MG TABS tablet Take 25 mg by mouth daily.  . enalapril (VASOTEC) 20 MG tablet Take 1 tablet (20 mg total) by mouth 2 (two) times daily.  [name] ezetimibe (ZETIA) 10 MG tablet Take 1 tablet (10 mg total) by mouth daily.  . finasteride (PROSCAR) 5 MG tablet Take 1 tablet (5 mg total) by mouth daily.  . fluticasone (FLONASE) 50 MCG/ACT nasal spray Place 1 spray into both nostrils 2 (two) times daily. Pt takes twice a day  . Fluticasone-Umeclidin-Vilant (  TRELEGY ELLIPTA) 100-62.5-25 MCG/INH AEPB Inhale 1 puff into the lungs daily.  [name] glipiZIDE (GLUCOTROL) 5 MG tablet Take 1 tablet (5 mg total) by mouth daily before breakfast.  . insulin degludec (TRESIBA FLEXTOUCH) 100 UNIT/ML FlexTouch Pen Inject 0.45 mLs (45 Units total) into the skin daily.  . metFORMIN (GLUCOPHAGE-XR) 500 MG 24 hr tablet Take 2 tablets (1,000 mg total) by mouth 2 (two) times daily.    . metoprolol tartrate (LOPRESSOR) 25 MG tablet Take 1 tablet (25 mg total) by mouth 2 (two) times daily.  . nitroGLYCERIN (NITROSTAT) 0.4 MG SL tablet Place 1 tablet (0.4 mg total) under the tongue every 5 (five) minutes as needed for chest pain.  . traMADol (ULTRAM) 50 MG tablet Take 1 tablet (50 mg total) by mouth every 8 (eight) hours as needed.   No facility-administered encounter medications on file as of 10/21/2019.    Allergies (verified) Brilinta [ticagrelor]   History: Past Medical History:  Diagnosis Date  . Arthritis    shoulder  . BPH (benign prostatic hyperplasia)   . CAD (coronary artery disease)   . Chronic kidney disease   . COPD (chronic obstructive pulmonary disease) (HCC)   . Diabetes mellitus without complication (HCC)   . Heart attack (HCC) 06/2019  . Hypertension   . MI (myocardial infarction) (HCC) 03/2017   Past Surgical History:  Procedure Laterality Date  . COLONOSCOPY WITH PROPOFOL N/A 05/02/2019   Procedure: COLONOSCOPY WITH PROPOFOL;  Surgeon: Midge Minium, MD;  Location: [facility];  Service: Endoscopy;  Laterality: N/A;  Diabetic - insulin and oral meds  . CORONARY ARTERY BYPASS GRAFT  2006   2 vessel  . CORONARY STENT INTERVENTION N/A 03/30/2017   Procedure: CORONARY STENT INTERVENTION;  Surgeon: Alwyn Pea, MD;  Location: [facility];  Service: Cardiovascular;  Laterality: N/A;  . INGUINAL HERNIA REPAIR     right  . LEFT HEART CATH AND CORONARY ANGIOGRAPHY N/A 07/14/2019   Procedure: LEFT HEART CATH AND CORONARY ANGIOGRAPHY;  Surgeon: Dalia Heading, MD;  Location: [facility] CV [facility];  Service: Cardiovascular;  Laterality: N/A;  . LEFT HEART CATH AND CORS/GRAFTS ANGIOGRAPHY N/A 03/30/2017   Procedure: LEFT HEART CATH AND CORS/GRAFTS ANGIOGRAPHY;  Surgeon: Lamar Blinks, MD;  Location: [facility];  Service: Cardiovascular;  Laterality: N/A;   Family History  Problem Relation Age of Onset  . Hypertension  Father   . Hypertension Mother   . Hypertension Sister   . Hypertension Brother   . Hypertension Brother   . Hypertension Brother   . Hypertension Brother   . Hypertension Brother   . Hypertension Sister   . Hypertension Sister   . Hypertension Sister   . Hypertension Sister   . Hypertension Sister    Social History   Socioeconomic History  . Marital status: Married    Spouse name: Not on file  . Number of children: Not on file  . Years of education: Not on file  . Highest education level: 9th grade  Occupational History  . Occupation: retired  Tobacco Use  . Smoking status: Former Smoker    Years: 40.00    Types: Cigarettes    Quit date: 2010    Years since quitting: 11.6  . Smokeless tobacco: Never Used  Vaping Use  . Vaping Use: Never used  Substance and Sexual Activity  . Alcohol use: No  . Drug use: No  . Sexual activity: Yes  Other Topics Concern  .  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 Difficulty of Paying Living Expenses: Not hard at all  Food Insecurity: No Food Insecurity  . Worried About Programme researcher, broadcasting/film/video in the Last Year: Never true  . Ran Out of Food in the Last Year: Never true  Transportation Needs: No Transportation Needs  . Lack of Transportation (Medical): No  . Lack of Transportation (Non-Medical): No  Physical Activity: Inactive  . Days of Exercise per Week: 0 days  . Minutes of Exercise per Session: 0 min  Stress: No Stress Concern Present  . Feeling of Stress : Not at all  Social Connections:   . Frequency of Communication with Friends and Family:   . Frequency of Social Gatherings with Friends and Family:   . Attends Religious Services:   . Active Member of Clubs or Organizations:   . Attends Banker Meetings:   [name] Marital Status:     Tobacco Counseling Counseling given: Not  Answered   Clinical Intake:  Pre-visit preparation completed: Yes  Pain : No/denies pain     Nutritional Status: BMI > 30  Obese Nutritional Risks: None Diabetes: Yes  How often do you need to have someone help you when you read instructions, pamphlets, or other written materials from your doctor or pharmacy?: 1 - Never What is the last grade level you completed in school?: 8th grade  Diabetic? Yes Nutrition Risk Assessment:  Has the patient had any N/V/D within the last 2 months?  No  Does the patient have any non-healing wounds?  No  Has the patient had any unintentional weight loss or weight gain?  No   Diabetes:  Is the patient diabetic?  Yes  If diabetic, was a CBG obtained today?  No  Did the patient bring in their glucometer from home?  No  How often do you monitor your CBG's? daily.   Financial Strains and Diabetes Management:  Are you having any financial strains with the device, your supplies or your medication? No .  Does the patient want to be seen by Chronic Care Management for management of their diabetes?  No  Would the patient like to be referred to a Nutritionist or for Diabetic Management?  No   Diabetic Exams:  Diabetic Eye Exam: Overdue for diabetic eye exam. Pt has been advised about the importance in completing this exam. Patient advised to call and schedule an eye exam. Diabetic Foot Exam: Overdue, Pt has been advised about the importance in completing this exam. Pt is scheduled for diabetic foot exam on next appointment.  Interpreter Needed?: No  Information entered by :: NAllen LPN   Activities of Daily Living In your present state of health, do you have any difficulty performing the following activities: 10/21/2019 07/14/2019  Hearing? N -  Vision? N -  Difficulty concentrating or making decisions? N -  Walking or climbing stairs? N -  Dressing or bathing? N -  Doing errands, shopping? N N  Preparing Food and eating ? N -  Using the  Toilet? N -  In the past six months, have you accidently leaked urine? N -  Do you have problems with loss of bowel control? N -  Managing your Medications? N -  Managing your Finances? N -  Housekeeping or managing your Housekeeping? N -  Some recent data might be hidden    Patient Care Team: [name], PA-C as PCP - General (Family Medicine) Lourena Simmonds, [facility] as Pharmacist (Pharmacist)  Indicate any recent Medical Services you may have received from other than [facility] providers in the past year (date may be approximate).     Assessment:   This is a routine wellness examination for Zackrey.  Hearing/Vision screen No exam data present  Dietary issues and exercise activities discussed: Current Exercise Habits: The patient has a physically strenuous job, but has no regular exercise apart from work.  Goals    .  "We want to keep his blood sugars better controlled" (pt-stated)      CARE PLAN ENTRY (see longtitudinal plan of care for additional care plan information)  Current Barriers:  . Diabetes: uncontrolled though improved, A1c 7.6% o Following up on getting patient back on insulin regimen. Patient's wife was to call [name] to request refill on Basaglar, and then call [name] to request an immediate supply of Basaglar. Patient notes that she tried to request a refill but her call got cut off  . Current antihyperglycemic regimen: Basaglar 56 units QAM, glipizide 5 mg QAM, metformin 1000 mg BID, Jardiance 25 mg, Trulicity 0.75 mg weekly o APPROVED for Jardiance assistance through [employer] through 03/16/20 o APPROVED for [employer] and Trulicity assistance through [place] through 03/16/20 . Current glucose readings:  o Reports fastings of 226 and 202  . Cardiovascular risk reduction (CAD s/p NSTEMI 1+ years ago, Followed by Dr. Gwen Pounds); LHC did not show areas amenable to PCI, medical management recommended; ECHO showed EF 50-55% o Current hypertensive  regimen: enalapril 20 mg BID, metoprolol tartrate 25 mg BID; amlodipine 5 mg daily o Current hyperlipidemia regimen: atorvastatin 80 mg daily, ezetimibe 10 mg daily; LDL at goal <70 . COPD: Trelegy daily, albuterol HFA PRN.  . BPH: finasteride 5 mg daily   Pharmacist Clinical Goal(s):  [name] Over the next 90 days, patient with work with PharmD and primary care provider to address optimized medication management  Interventions: . Comprehensive medication review performed, medication list updated in electronic medical record . Inter-disciplinary care team collaboration (see longitudinal plan of care) . Completed a three way call with patient's wife and [facility]. Unfortunately, since he has approval for Basaglar through [employer], they will not provide an immediate supply. They noted they would request his Basaglar refill be expedited.  Geradine Girt [name] to request an expedited refill. The company will be able to ship today to have delivered tomorrow. Notified patient's wife. Reiterated recommendation to restart w/ Basaglar 40 units daily (dose reduction d/t now being on Trulicity). She verbalized understanding.   Patient Self Care Activities:  . Patient will check blood glucose BID , document, and provide at future appointments . Patient will take medications as prescribed . Patient will report any questions or concerns to provider   Please see past updates related to this goal by clicking on the "Past Updates" button in the selected goal       .  DIET - INCREASE WATER INTAKE      Recommend drinking at least 6-8 glasses of water a day    .  Patient Stated      10/21/2019, no goals      Depression Screen PHQ 2/9 Scores 10/21/2019 07/25/2019 07/19/2018 07/10/2017 05/18/2017 04/27/2017 04/06/2017  PHQ - 2 Score 0 0 0 0 0 0 0  PHQ- 9 Score 1 - - - - - -  Fall Risk Fall Risk  10/21/2019 07/25/2019 07/19/2018 07/10/2017 05/18/2017  Falls in the past year? 0 0 0 No No  Number falls in  past yr: - 0 - - -  Injury with Fall? - 0 - - -  Risk for fall due to : Medication side effect - - - -  Follow up Falls evaluation completed;Education provided;Falls prevention discussed - - - -    Any stairs in or around the home? Yes  If so, are there any without handrails? Yes  Home free of loose throw rugs in walkways, pet beds, electrical cords, etc? Yes  Adequate lighting in your home to reduce risk of falls? Yes   ASSISTIVE DEVICES UTILIZED TO PREVENT FALLS:  Life alert? No  Use of a cane, walker or w/c? No  Grab bars in the bathroom? Yes  Shower chair or bench in shower? Yes  Elevated toilet seat or a handicapped toilet? Yes   TIMED UP AND GO:  Was the test performed? No .     Cognitive Function:     6CIT Screen 10/21/2019 07/19/2018 07/10/2017  What Year? 0 points 0 points 4 points  What month? 3 points 0 points 3 points  What time? 0 points 0 points 0 points  Count back from 20 4 points 0 points 0 points  Months in reverse 4 points 0 points 0 points  Repeat phrase 10 points 0 points 2 points  Total Score 21 0 9    Immunizations Immunization History  Administered Date(s) Administered  . Influenza Inj Mdck Quad Pf 12/28/2018  . Influenza, High Dose Seasonal PF 12/31/2016, 11/23/2017  . Influenza, Seasonal, Injecte, Preservative Fre 01/22/2006, 01/10/2008, 02/05/2009, 02/19/2010, 03/28/2011, 12/25/2011  . Influenza,inj,Quad PF,6+ Mos 12/13/2013, 11/21/2014, 01/25/2016  . Influenza-Unspecified 02/15/2013, 12/31/2016, 12/28/2018  . Pneumococcal Conjugate-13 08/15/2014  . Pneumococcal Polysaccharide-23 05/02/2003, 02/19/2010    TDAP status: Due, Education has been provided regarding the importance of this vaccine. Advised may receive this vaccine at local pharmacy or Health Dept. Aware to provide a copy of the vaccination record if obtained from local pharmacy or Health Dept. Verbalized acceptance and understanding. Flu Vaccine status: Up to date Pneumococcal vaccine  status: Up to date Covid-19 vaccine status: Completed vaccines  Qualifies for Shingles Vaccine? Yes   Zostavax completed No   Shingrix Completed?: No.    Education has been provided regarding the importance of this vaccine. Patient has been advised to call insurance company to determine out of pocket expense if they have not yet received this vaccine. Advised may also receive vaccine at local pharmacy or Health Dept. Verbalized acceptance and understanding.  Screening Tests Health Maintenance  Topic Date Due  . FOOT EXAM  Never done  . OPHTHALMOLOGY EXAM  Never done  . COVID-19 Vaccine (1) Never done  . TETANUS/TDAP  Never done  . INFLUENZA VACCINE  10/16/2019  . HEMOGLOBIN A1C  12/27/2019  . COLONOSCOPY  05/01/2029  . Hepatitis C Screening  Completed  . PNA vac Low Risk Adult  Completed    Health Maintenance  Health Maintenance Due  Topic Date Due  . FOOT EXAM  Never done  . OPHTHALMOLOGY EXAM  Never done  . COVID-19 Vaccine (1) Never done  . TETANUS/TDAP  Never done  . INFLUENZA VACCINE  10/16/2019    Colorectal cancer screening: No longer required.   Lung Cancer Screening: (Low Dose CT Chest recommended if Age 20-80 years, 30 pack-year currently smoking OR have quit w/in 15years.) does not qualify.  Lung Cancer Screening Referral: no  Additional Screening:  Hepatitis C Screening: does qualify; Completed 07/10/2017  Vision Screening: Recommended annual ophthalmology exams for early detection of glaucoma and other disorders of the eye. Is the patient up to date with their annual eye exam?  No  Who is the provider or what is the name of the office in which the patient attends annual eye exams? Doesn't have an appointment If pt is not established with a provider, would they like to be referred to a provider to establish care? No .   Dental Screening: Recommended annual dental exams for proper oral hygiene  Community Resource Referral / Chronic Care Management: CRR  required this visit?  No   CCM required this visit?  No      Plan:     I have personally reviewed and noted the following in the patient's chart:   . Medical and social history . Use of alcohol, tobacco or illicit drugs  . Current medications and supplements . Functional ability and status . Nutritional status . Physical activity . Advanced directives . List of other physicians . Hospitalizations, surgeries, and ER visits in previous 12 months . Vitals . Screenings to include cognitive, depression, and falls . Referrals and appointments  In addition, I have reviewed and discussed with patient certain preventive protocols, quality metrics, and best practice recommendations. A written personalized care plan for preventive services as well as general preventive health recommendations were provided to patient.     Barb Merino, LPN   09/18/9447   Nurse Notes: 6 CIT was 21. Patient did not attempt to count or do the months of the year. Will bring covid vaccine dates to appointment.

## 2019-10-21 NOTE — Patient Instructions (Signed)
[name] , Thank you for taking time to come for your Medicare Wellness Visit. I appreciate your ongoing commitment to your health goals. Please review the following plan we discussed and let me know if I can assist you in the future.   Screening recommendations/referrals: Colonoscopy: completed 05/02/2019 Recommended yearly ophthalmology/optometry visit for glaucoma screening and checkup Recommended yearly dental visit for hygiene and checkup  Vaccinations: Influenza vaccine: due Pneumococcal vaccine: completed 08/15/2014 Tdap vaccine: due Shingles vaccine: discussed   Covid-19: will bring dates to next appointment  Advanced directives: Advance directive discussed with you today.    Conditions/risks identified: 6 CIT was 21. Did not attempt a few of the questions.  Next appointment: Follow up in one year for your annual wellness visit.   Preventive Care 41 Years and Older, Male Preventive care refers to lifestyle choices and visits with your health care provider that can promote health and wellness. What does preventive care include?  A yearly physical exam. This is also called an annual well check.  Dental exams once or twice a year.  Routine eye exams. Ask your health care provider how often you should have your eyes checked.  Personal lifestyle choices, including:  Daily care of your teeth and gums.  Regular physical activity.  Eating a healthy diet.  Avoiding tobacco and drug use.  Limiting alcohol use.  Practicing safe sex.  Taking low doses of aspirin every day.  Taking vitamin and mineral supplements as recommended by your health care provider. What happens during an annual well check? The services and screenings done by your health care provider during your annual well check will depend on your age, overall health, lifestyle risk factors, and family history of disease. Counseling  Your health care provider may ask you questions about your:  Alcohol  use.  Tobacco use.  Drug use.  Emotional well-being.  Home and relationship well-being.  Sexual activity.  Eating habits.  History of falls.  Memory and ability to understand (cognition).  Work and work Astronomer. Screening  You may have the following tests or measurements:  Height, weight, and BMI.  Blood pressure.  Lipid and cholesterol levels. These may be checked every 5 years, or more frequently if you are over 32 years old.  Skin check.  Lung cancer screening. You may have this screening every year starting at age 24 if you have a 30-pack-year history of smoking and currently smoke or have quit within the past 15 years.  Fecal occult blood test (FOBT) of the stool. You may have this test every year starting at age 58.  Flexible sigmoidoscopy or colonoscopy. You may have a sigmoidoscopy every 5 years or a colonoscopy every 10 years starting at age 54.  Prostate cancer screening. Recommendations will vary depending on your family history and other risks.  Hepatitis C blood test.  Hepatitis B blood test.  Sexually transmitted disease (STD) testing.  Diabetes screening. This is done by checking your blood sugar (glucose) after you have not eaten for a while (fasting). You may have this done every 1-3 years.  Abdominal aortic aneurysm (AAA) screening. You may need this if you are a current or former smoker.  Osteoporosis. You may be screened starting at age 72 if you are at high risk. Talk with your health care provider about your test results, treatment options, and if necessary, the need for more tests. Vaccines  Your health care provider may recommend certain vaccines, such as:  Influenza vaccine. This is recommended every  year.  Tetanus, diphtheria, and acellular pertussis (Tdap, Td) vaccine. You may need a Td booster every 10 years.  Zoster vaccine. You may need this after age 42.  Pneumococcal 13-valent conjugate (PCV13) vaccine. One dose is  recommended after age 19.  Pneumococcal polysaccharide (PPSV23) vaccine. One dose is recommended after age 72. Talk to your health care provider about which screenings and vaccines you need and how often you need them. This information is not intended to replace advice given to you by your health care provider. Make sure you discuss any questions you have with your health care provider. Document Released: 03/30/2015 Document Revised: 11/21/2015 Document Reviewed: 01/02/2015 Elsevier Interactive Patient Education  2017 [employer] Prevention in the Home Falls can cause injuries. They can happen to people of all ages. There are many things you can do to make your home safe and to help prevent falls. What can I do on the outside of my home?  Regularly fix the edges of walkways and driveways and fix any cracks.  Remove anything that might make you trip as you walk through a door, such as a raised step or threshold.  Trim any bushes or trees on the path to your home.  Use bright outdoor lighting.  Clear any walking paths of anything that might make someone trip, such as rocks or tools.  Regularly check to see if handrails are loose or broken. Make sure that both sides of any steps have handrails.  Any raised decks and porches should have guardrails on the edges.  Have any leaves, snow, or ice cleared regularly.  Use sand or salt on walking paths during winter.  Clean up any spills in your garage right away. This includes oil or grease spills. What can I do in the bathroom?  Use night lights.  Install grab bars by the toilet and in the tub and shower. Do not use towel bars as grab bars.  Use non-skid mats or decals in the tub or shower.  If you need to sit down in the shower, use a plastic, non-slip stool.  Keep the floor dry. Clean up any water that spills on the floor as soon as it happens.  Remove soap buildup in the tub or shower regularly.  Attach bath mats  securely with double-sided non-slip rug tape.  Do not have throw rugs and other things on the floor that can make you trip. What can I do in the bedroom?  Use night lights.  Make sure that you have a light by your bed that is easy to reach.  Do not use any sheets or blankets that are too big for your bed. They should not hang down onto the floor.  Have a firm chair that has side arms. You can use this for support while you get dressed.  Do not have throw rugs and other things on the floor that can make you trip. What can I do in the kitchen?  Clean up any spills right away.  Avoid walking on wet floors.  Keep items that you use a lot in easy-to-reach places.  If you need to reach something above you, use a strong step stool that has a grab bar.  Keep electrical cords out of the way.  Do not use floor polish or wax that makes floors slippery. If you must use wax, use non-skid floor wax.  Do not have throw rugs and other things on the floor that can make you trip. What can  I do with my stairs?  Do not leave any items on the stairs.  Make sure that there are handrails on both sides of the stairs and use them. Fix handrails that are broken or loose. Make sure that handrails are as long as the stairways.  Check any carpeting to make sure that it is firmly attached to the stairs. Fix any carpet that is loose or worn.  Avoid having throw rugs at the top or bottom of the stairs. If you do have throw rugs, attach them to the floor with carpet tape.  Make sure that you have a light switch at the top of the stairs and the bottom of the stairs. If you do not have them, ask someone to add them for you. What else can I do to help prevent falls?  Wear shoes that:  Do not have high heels.  Have rubber bottoms.  Are comfortable and fit you well.  Are closed at the toe. Do not wear sandals.  If you use a stepladder:  Make sure that it is fully opened. Do not climb a closed  stepladder.  Make sure that both sides of the stepladder are locked into place.  Ask someone to hold it for you, if possible.  Clearly mark and make sure that you can see:  Any grab bars or handrails.  First and last steps.  Where the edge of each step is.  Use tools that help you move around (mobility aids) if they are needed. These include:  Canes.  Walkers.  Scooters.  Crutches.  Turn on the lights when you go into a dark area. Replace any light bulbs as soon as they burn out.  Set up your furniture so you have a clear path. Avoid moving your furniture around.  If any of your floors are uneven, fix them.  If there are any pets around you, be aware of where they are.  Review your medicines with your doctor. Some medicines can make you feel dizzy. This can increase your chance of falling. Ask your doctor what other things that you can do to help prevent falls. This information is not intended to replace advice given to you by your health care provider. Make sure you discuss any questions you have with your health care provider. Document Released: 12/28/2008 Document Revised: 08/09/2015 Document Reviewed: 04/07/2014 Elsevier Interactive Patient Education  2017 [employer].

## 2019-10-28 ENCOUNTER — Ambulatory Visit: Payer: Medicare HMO | Admitting: Family Medicine

## 2019-11-01 ENCOUNTER — Encounter: Payer: Self-pay | Admitting: Family Medicine

## 2019-11-01 ENCOUNTER — Ambulatory Visit (INDEPENDENT_AMBULATORY_CARE_PROVIDER_SITE_OTHER): Payer: Medicare HMO | Admitting: Family Medicine

## 2019-11-01 ENCOUNTER — Other Ambulatory Visit: Payer: Self-pay

## 2019-11-01 VITALS — BP 118/68 | HR 61 | Temp 98.2°F | Wt 197.0 lb

## 2019-11-01 DIAGNOSIS — E1129 Type 2 diabetes mellitus with other diabetic kidney complication: Secondary | ICD-10-CM | POA: Diagnosis not present

## 2019-11-01 DIAGNOSIS — IMO0002 Reserved for concepts with insufficient information to code with codable children: Secondary | ICD-10-CM

## 2019-11-01 DIAGNOSIS — N400 Enlarged prostate without lower urinary tract symptoms: Secondary | ICD-10-CM

## 2019-11-01 DIAGNOSIS — E1165 Type 2 diabetes mellitus with hyperglycemia: Secondary | ICD-10-CM | POA: Diagnosis not present

## 2019-11-01 NOTE — Progress Notes (Signed)
BP 118/68    Pulse 61    Temp 98.2 F (36.8 C) (Oral)    Wt 197 lb (89.4 kg)    SpO2 98%    BMI 31.80 kg/m    Subjective:    Patient ID: Benjamin Chan, male    DOB: 1946/01/26, 73 y.o.   MRN: [id number]  HPI: Benjamin Chan is a 74 y.o. male  Chief Complaint  Patient presents with   Diabetes   Here today for DM f/u. BSs running around 80 - 108 range when he checks at home. No low blood sugar spells noted. Taking tresiba 45 units daily, metformin, jardiance, trulicity faithfully without side effects. Not exercising, but trying to watch diet.   Relevant past medical, surgical, family and social history reviewed and updated as indicated. Interim medical history since our last visit reviewed. Allergies and medications reviewed and updated.  Review of Systems  Per HPI unless specifically indicated above     Objective:    BP 118/68    Pulse 61    Temp 98.2 F (36.8 C) (Oral)    Wt 197 lb (89.4 kg)    SpO2 98%    BMI 31.80 kg/m   Wt Readings from Last 3 Encounters:  11/01/19 197 lb (89.4 kg)  10/21/19 210 lb (95.3 kg)  10/04/19 206 lb (93.4 kg)    Physical Exam Vitals and nursing note reviewed.  Constitutional:      Appearance: Normal appearance.  HENT:     Head: Atraumatic.  Eyes:     Extraocular Movements: Extraocular movements intact.     Conjunctiva/sclera: Conjunctivae normal.  Cardiovascular:     Rate and Rhythm: Normal rate and regular rhythm.  Pulmonary:     Effort: Pulmonary effort is normal.     Breath sounds: Normal breath sounds.  Musculoskeletal:        General: Normal range of motion.     Cervical back: Normal range of motion and neck supple.  Skin:    General: Skin is warm and dry.  Neurological:     General: No focal deficit present.     Mental Status: He is oriented to person, place, and time.  Psychiatric:        Mood and Affect: Mood normal.        Thought Content: Thought content normal.        Judgment: Judgment normal.      Results for orders placed or performed in visit on 11/01/19  Comprehensive metabolic panel  Result Value Ref Range   Glucose 84 65 - 99 mg/dL   BUN 18 8 - 27 mg/dL   Creatinine, Ser 2.97 (H) 0.76 - 1.27 mg/dL   GFR calc non Af Amer 54 (L) >59 mL/min/1.73   GFR calc Af Amer 63 >59 mL/min/1.73   BUN/Creatinine Ratio 14 10 - 24   Sodium 138 134 - 144 mmol/L   Potassium 4.7 3.5 - 5.2 mmol/L   Chloride 103 96 - 106 mmol/L   CO2 23 20 - 29 mmol/L   Calcium 9.1 8.6 - 10.2 mg/dL   Total Protein 6.9 6.0 - 8.5 g/dL   Albumin 4.1 3.7 - 4.7 g/dL   Globulin, Total 2.8 1.5 - 4.5 g/dL   Albumin/Globulin Ratio 1.5 1.2 - 2.2   Bilirubin Total 0.5 0.0 - 1.2 mg/dL   Alkaline Phosphatase 87 48 - 121 IU/L   AST 16 0 - 40 IU/L   ALT 13 0 - 44 IU/L  HgB  A1c  Result Value Ref Range   Hgb A1c MFr Bld 7.5 (H) 4.8 - 5.6 %   Est. average glucose Bld gHb Est-mCnc 169 mg/dL      Assessment & Plan:   Problem List Items Addressed This Visit      Endocrine   DM (diabetes mellitus), type 2, uncontrolled, with renal complications (HCC) - Primary    Recommended reducing insulin but 2-5 units and continuing to monitor home BSs closely due to readings on the lower end of normal. He notes he sometimes forgets a meal here and there, concern for hypoglycemic episodes with sugars sometimes running in the 80s. Continue remainder of medications as before, exercise as tolerated, diet discussed for increased stability and control      Relevant Orders   Comprehensive metabolic panel (Completed)   HgB A1c (Completed)     Genitourinary   BPH (benign prostatic hyperplasia)    Worsening urinary hesitancy. Continue proscar, declines Urology referral at this time but will call back if changing mind          Follow up plan: Return in about 3 months (around 02/01/2020) for 6 month f/u.

## 2019-11-01 NOTE — Assessment & Plan Note (Signed)
Worsening urinary hesitancy. Continue proscar, declines Urology referral at this time but will call back if changing mind

## 2019-11-01 NOTE — Patient Instructions (Signed)
Decrease tresiba by 3-5 units and see how your blood sugars do with that - just wanting to ensure no low blood sugar spells.

## 2019-11-02 LAB — COMPREHENSIVE METABOLIC PANEL
ALT: 13 IU/L (ref 0–44)
AST: 16 IU/L (ref 0–40)
Albumin/Globulin Ratio: 1.5 (ref 1.2–2.2)
Albumin: 4.1 g/dL (ref 3.7–4.7)
Alkaline Phosphatase: 87 IU/L (ref 48–121)
BUN/Creatinine Ratio: 14 (ref 10–24)
BUN: 18 mg/dL (ref 8–27)
Bilirubin Total: 0.5 mg/dL (ref 0.0–1.2)
CO2: 23 mmol/L (ref 20–29)
Calcium: 9.1 mg/dL (ref 8.6–10.2)
Chloride: 103 mmol/L (ref 96–106)
Creatinine, Ser: 1.28 mg/dL — ABNORMAL HIGH (ref 0.76–1.27)
GFR calc Af Amer: 63 mL/min/{1.73_m2} (ref >59)
GFR calc non Af Amer: 54 mL/min/{1.73_m2} — ABNORMAL LOW (ref >59)
Globulin, Total: 2.8 g/dL (ref 1.5–4.5)
Glucose: 84 mg/dL (ref 65–99)
Potassium: 4.7 mmol/L (ref 3.5–5.2)
Sodium: 138 mmol/L (ref 134–144)
Total Protein: 6.9 g/dL (ref 6.0–8.5)

## 2019-11-02 LAB — HEMOGLOBIN A1C
Est. average glucose Bld gHb Est-mCnc: 169 mg/dL
Hgb A1c MFr Bld: 7.5 % — ABNORMAL HIGH (ref 4.8–5.6)

## 2019-11-07 NOTE — Assessment & Plan Note (Signed)
Recommended reducing insulin but 2-5 units and continuing to monitor home BSs closely due to readings on the lower end of normal. He notes he sometimes forgets a meal here and there, concern for hypoglycemic episodes with sugars sometimes running in the 80s. Continue remainder of medications as before, exercise as tolerated, diet discussed for increased stability and control

## 2019-11-15 ENCOUNTER — Other Ambulatory Visit: Payer: Self-pay | Admitting: Family Medicine

## 2019-11-23 ENCOUNTER — Ambulatory Visit: Payer: Self-pay | Admitting: Pharmacist

## 2019-11-23 DIAGNOSIS — IMO0002 Reserved for concepts with insufficient information to code with codable children: Secondary | ICD-10-CM

## 2019-11-23 DIAGNOSIS — E1129 Type 2 diabetes mellitus with other diabetic kidney complication: Secondary | ICD-10-CM

## 2019-11-23 DIAGNOSIS — I10 Essential (primary) hypertension: Secondary | ICD-10-CM

## 2019-11-24 NOTE — Chronic Care Management (AMB) (Signed)
Chronic Care Management   Follow Up Note   11/24/2019 Name: Benjamin Chan MRN: [id number] DOB: 11/25/1945  Referred by: [name], PA-C Reason for referral : Chronic Care Management (follow-up (DM, HTN))   KIYON Chan is a 74 y.o. year old male who is a primary care patient of [name], [place]. The CCM team was consulted for assistance with chronic disease management and care coordination needs.    Review of patient status, including review of consultants reports, relevant laboratory and other test results, and collaboration with appropriate care team members and the patient's provider was performed as part of comprehensive patient evaluation and provision of chronic care management services.    SDOH (Social Determinants of Health) assessments performed: No See Care Plan activities for detailed interventions related to [facility])     Outpatient Encounter Medications as of 11/23/2019  Medication Sig Note  . amLODipine (NORVASC) 10 MG tablet Take 0.5 tablets (5 mg total) by mouth daily.   [name] atorvastatin (LIPITOR) 80 MG tablet Take 1 tablet (80 mg total) by mouth daily.   . clopidogrel (PLAVIX) 75 MG tablet Take 1 tablet (75 mg total) by mouth daily with breakfast.   . Dulaglutide (TRULICITY) 0.75 MG/0.5ML SOPN Inject 0.75 mg into the skin once a week.   . empagliflozin (JARDIANCE) 25 MG TABS tablet Take 25 mg by mouth daily.   . enalapril (VASOTEC) 20 MG tablet Take 1 tablet (20 mg total) by mouth 2 (two) times daily.   [name] ezetimibe (ZETIA) 10 MG tablet Take 1 tablet (10 mg total) by mouth daily.   . finasteride (PROSCAR) 5 MG tablet Take 1 tablet (5 mg total) by mouth daily.   . fluticasone (FLONASE) 50 MCG/ACT nasal spray Place 1 spray into both nostrils 2 (two) times daily. Pt takes twice a day 05/25/2019: Using PRN  . glipiZIDE (GLUCOTROL) 5 MG tablet TAKE ONE TABLET BY MOUTH EVERY MORNING BEFORE BREAKFAST   . Insulin Glargine (BASAGLAR KWIKPEN) 100 UNIT/ML  Inject 40 Units into the skin in the morning.   . metFORMIN (GLUCOPHAGE-XR) 500 MG 24 hr tablet Take 2 tablets (1,000 mg total) by mouth 2 (two) times daily.   . metoprolol tartrate (LOPRESSOR) 25 MG tablet Take 1 tablet (25 mg total) by mouth 2 (two) times daily.   . Multiple Vitamin (MULTIVITAMIN PO) Take by mouth daily. Gnp century mature multivitamin tab 125c   . nitroGLYCERIN (NITROSTAT) 0.4 MG SL tablet Place 1 tablet (0.4 mg total) under the tongue every 5 (five) minutes as needed for chest pain.   [name] acetaminophen (TYLENOL) 325 MG tablet Take 650 mg by mouth every 6 (six) hours as needed.   [name] albuterol (PROVENTIL) (2.5 MG/3ML) 0.083% nebulizer solution Take 3 mLs (2.5 mg total) by nebulization every 6 (six) hours as needed for wheezing or shortness of breath.   [name] albuterol (VENTOLIN HFA) 108 (90 Base) MCG/ACT inhaler Inhale 2 puffs into the lungs every 6 (six) hours as needed for wheezing or shortness of breath.   [name] aspirin EC 81 MG tablet Take 1 tablet (81 mg total) by mouth daily.   . Fluticasone-Umeclidin-Vilant (TRELEGY ELLIPTA) 100-62.5-25 MCG/INH AEPB Inhale 1 puff into the lungs daily. (Patient not taking: Reported on 11/24/2019)   . insulin degludec (TRESIBA FLEXTOUCH) 100 UNIT/ML FlexTouch Pen Inject 0.45 mLs (45 Units total) into the skin daily. (Patient not taking: Reported on 11/24/2019) 11/24/2019: Patient receives Basaglar through PAP   No facility-administered encounter medications on file as of  11/23/2019.     Objective:   Goals Addressed              This Visit's Progress   .  PharmD "We want to keep his blood sugars better controlled" (pt-stated)        CARE PLAN ENTRY (see longtitudinal plan of care for additional care plan information)  Current Barriers:  . Diabetes: uncontrolled though improved, A1c 7.5% . Current antihyperglycemic regimen: Basaglar 40 units QAM, glipizide 5 mg QAM, metformin 1000 mg BID, Jardiance 25 mg, Trulicity 0.75 mg weekly o APPROVED for  Jardiance assistance through [facility] through 03/16/20 o APPROVED for [employer] and Trulicity assistance through [place] through 03/16/20 . Current glucose readings:  o Reports he is picking up strips today and has been out for about one week and not checking. Reports one BG reading in the 80's around 8/17 but otherwise none below 100-110. He reports previously feeling "nervous" when he thought his sugar was low. He reports usually skipping breakfast and is having his first meal of the day during our meeting at 13:32.  . Cardiovascular risk reduction (CAD s/p NSTEMI 1+ years ago, Followed by Dr. Gwen Pounds); LHC did not show areas amenable to PCI, medical management recommended; ECHO showed EF 50-55% o Current hypertensive regimen: enalapril 20 mg BID, metoprolol tartrate 25 mg BID; amlodipine 5 mg daily o Current hyperlipidemia regimen: atorvastatin 80 mg daily, ezetimibe 10 mg daily; LDL at goal <70 . COPD: Trelegy daily, albuterol HFA PRN. He is not taking Trelegy and denies and shortness of breath. He states this is too expensive. Will pursue PAP for this medication. . BPH: finasteride 5 mg daily   Pharmacist Clinical Goal(s):  [name] Over the next 90 days, patient with work with PharmD and primary care provider to address optimized medication management  Interventions: . Comprehensive medication review performed, medication list updated in electronic medical record . Inter-disciplinary care team collaboration (see longitudinal plan of care) . Spoke with patient and spouse today. Patient verified he is taking 40 units of Basaglar daily. Encouraged patient to resume testing BG bid and to request refills prior to using last strip in future. He denies symptoms of hypoglycemia and encouraged him to check BG when he feels "nervous" in the future and report any readings below 70 to MD. . Encouraged patient to eat regular meals to help avoid lows. He voiced understanding. . Patient and wife were out to lunch and  could not verify amount of Trulicity and Basaglar on hand. Doesn't think he needs refills at this time. Will ask CMA to call [name] for verification.   Patient Self Care Activities:  . Patient will check blood glucose BID , document, and provide at future appointments . Patient will take medications as prescribed . Patient will report any questions or concerns to provider   Please see past updates related to this goal by clicking on the "Past Updates" button in the selected goal           Plan:   Telephone follow up appointment with care management team member scheduled for: 3 months   Mercer Pod. Tiburcio Pea PharmD, BCPS Clinical Pharmacist [facility] [phone]

## 2019-11-24 NOTE — Patient Instructions (Addendum)
Visit Information  It was a pleasure speaking with you today. Thank you for letting me be part of your clinical team. Please call with any questions or concerns.   Goals Addressed              This Visit's Progress   .  PharmD "We want to keep his blood sugars better controlled" (pt-stated)        CARE PLAN ENTRY (see longtitudinal plan of care for additional care plan information)  Current Barriers:  . Diabetes: uncontrolled though improved, A1c 7.5% . Current antihyperglycemic regimen: Basaglar 40 units QAM, glipizide 5 mg QAM, metformin 1000 mg BID, Jardiance 25 mg, Trulicity 0.75 mg weekly o APPROVED for Jardiance assistance through [employer] through 03/16/20 o APPROVED for [employer] and Trulicity assistance through [employer] through 03/16/20 . Current glucose readings:  o Reports he is picking up strips today and has been out for about one week and not checking. Reports one BG reading in the 80's around 8/17 but otherwise none below 100-110. He reports previously feeling "nervous" when he thought his sugar was low. He reports usually skipping breakfast and is having his first meal of the day during our meeting at 13:32.  . Cardiovascular risk reduction (CAD s/p NSTEMI 1+ years ago, Followed by Dr. Gwen Pounds); LHC did not show areas amenable to PCI, medical management recommended; ECHO showed EF 50-55% o Current hypertensive regimen: enalapril 20 mg BID, metoprolol tartrate 25 mg BID; amlodipine 5 mg daily o Current hyperlipidemia regimen: atorvastatin 80 mg daily, ezetimibe 10 mg daily; LDL at goal <70 . COPD: Trelegy daily, albuterol HFA PRN. He is not taking Trelegy and denies and shortness of breath. He states this is too expensive. Will pursue PAP for this medication. . BPH: finasteride 5 mg daily   Pharmacist Clinical Goal(s):  [name] Over the next 90 days, patient with work with PharmD and primary care provider to address optimized medication management  Interventions: . Comprehensive  medication review performed, medication list updated in electronic medical record . Inter-disciplinary care team collaboration (see longitudinal plan of care) . Spoke with patient and spouse today. Patient verified he is taking 40 units of Basaglar daily. Encouraged patient to resume testing BG bid and to request refills prior to using last strip in future. He denies symptoms of hypoglycemia and encouraged him to check BG when he feels "nervous" in the future and report any readings below 70 to MD. . Encouraged patient to eat regular meals to help avoid lows. He voiced understanding. . Patient and wife were out to lunch and could not verify amount of Trulicity and Basaglar on hand. Doesn't think he needs refills at this time. Will ask CMA to call [name] for verification.   Patient Self Care Activities:  . Patient will check blood glucose BID , document, and provide at future appointments . Patient will take medications as prescribed . Patient will report any questions or concerns to provider   Please see past updates related to this goal by clicking on the "Past Updates" button in the selected goal          The patient verbalized understanding of instructions provided today and agreed to receive a mailed copy of patient instruction and/or educational materials.  Telephone follow up appointment with pharmacy team member scheduled for: 3 months  Mercer Pod. Jericha Bryden PharmD, BCPS Clinical Pharmacist [phone]  Type 2 Diabetes Mellitus, Diagnosis, Adult Type 2 diabetes (type 2 diabetes mellitus) is a long-term (chronic) disease. It may be  caused by one or both of these problems:  Your pancreas does not make enough of a hormone called insulin.  Your body does not react in a normal way to insulin that it makes. Insulin lets sugars (glucose) go into cells in your body. This gives you energy. If you have type 2 diabetes, sugars cannot get into cells. This causes high blood sugar  (hyperglycemia). Your doctor will set treatment goals for you. Generally, you should have these blood sugar levels:  Before meals (preprandial): 80-130 mg/dL (2.4-4.0 mmol/L).  After meals (postprandial): below 180 mg/dL (10 mmol/L).  A1c (hemoglobin A1c) level: less than 7%. Follow these instructions at home: Questions to ask your doctor  You may want to ask these questions: ? Do I need to meet with a diabetes educator? ? Where can I find a support group for people with diabetes? ? What equipment will I need to care for myself at home? ? What diabetes medicines do I need? When should I take them? ? How often do I need to check my blood sugar? ? What number can I call if I have questions? ? When is my next doctor's visit? General instructions  Take over-the-counter and prescription medicines only as told by your doctor.  Keep all follow-up visits as told by your doctor. This is important. Contact a doctor if:  Your blood sugar is at or above 240 mg/dL (10.2 mmol/L) for 2 days in a row.  You have been sick for 2 days or more, and you are not getting better.  You have had a fever for 2 days or more, and you are not getting better.  You have any of these problems for more than 6 hours: ? You cannot eat or drink. ? You feel sick to your stomach (nauseous). ? You throw up (vomit). ? You have watery poop (diarrhea). Get help right away if:  Your blood sugar is lower than 54 mg/dL (3 mmol/L).  You get confused.  You have trouble: ? Thinking clearly. ? Breathing.  You have moderate or large ketone levels in your pee (urine). Summary  Type 2 diabetes is a long-term (chronic) disease. Your pancreas may not make enough of a hormone called insulin, or your body may not react normally to insulin that it makes.  Take over-the-counter and prescription medicines only as told by your doctor.  Keep all follow-up visits as told by your doctor. This is important. This information  is not intended to replace advice given to you by your health care provider. Make sure you discuss any questions you have with your health care provider. Document Revised: 05/01/2017 Document Reviewed: 04/06/2015 Elsevier Patient Education  2020 [employer].

## 2019-12-02 ENCOUNTER — Other Ambulatory Visit: Payer: Self-pay

## 2019-12-02 NOTE — Telephone Encounter (Signed)
[facility] faxed a Rx refill request on Aspirin 81 mg and metformin 500 mg tab

## 2019-12-05 ENCOUNTER — Telehealth: Payer: Self-pay | Admitting: Pharmacist

## 2019-12-05 NOTE — Progress Notes (Signed)
12/05/19 - Spoke with [employer] to verify when next refills for Basaglar 100 UNIT/ML and Trulicity 0.75 MG/0.5ML can be ordered. Last shipment for Basaglar was on 09/24/19 and Trulicity was on 10/28/19. Next refill of Basaglar 100 UNIT/ML will be on 12/25/19 and Trulicity 0.75 MG/0.5ML will be on 01/13/20 per [employer].

## 2019-12-06 MED ORDER — ASPIRIN EC 81 MG PO TBEC: 81.0000 mg | DELAYED_RELEASE_TABLET | Freq: Every day | ORAL | 3 refills | Status: DC

## 2019-12-06 MED ORDER — METFORMIN HCL ER 500 MG PO TB24: 1000.0000 mg | ORAL_TABLET | Freq: Two times a day (BID) | ORAL | 0 refills | Status: DC

## 2019-12-12 ENCOUNTER — Telehealth: Payer: Self-pay | Admitting: Pharmacist

## 2019-12-12 ENCOUNTER — Other Ambulatory Visit: Payer: Self-pay | Admitting: Family Medicine

## 2019-12-12 NOTE — Progress Notes (Signed)
°  Chronic Care Management   Note  12/12/2019 Name: Benjamin Chan MRN: [id number] DOB: February 13, 1946    Spoke with patient's spouse, Blenda Peals. She requested "fluid pills" stating Mr. Bilton's legs have been swollen for a couple of days. I informed her that he does not have an active diuretic prescription in his profile.  I gave her the number advised her to call the office and schedule an appointment for him to be evaluated. She voiced understanding. Will also reach out to front office staff for assistance in scheduling.   Mercer Pod. Tiburcio Pea PharmD, BCPS Clinical Pharmacist [facility] [phone]

## 2019-12-12 NOTE — Telephone Encounter (Signed)
Received voicemail from patient's wife requesting a return call with medication question. Routing to patient's current embedded CCM pharmacist.

## 2019-12-12 NOTE — Telephone Encounter (Signed)
Error. Encounter opened in wrong context

## 2019-12-13 ENCOUNTER — Other Ambulatory Visit: Payer: Self-pay | Admitting: Family Medicine

## 2019-12-13 NOTE — Telephone Encounter (Signed)
Routing to provider  

## 2019-12-13 NOTE — Telephone Encounter (Signed)
[facility] requesting the following the medications:  Multiple Vitamin (MULTIVITAMIN PO, enalapril (VASOTEC) 20 MG tablet, clopidogrel (PLAVIX) 75 MG tablet , metoprolol tartrate (LOPRESSOR) 25 MG tablet , metFORMIN (GLUCOPHAGE-XR) 500 MG    Informed please allow 48 to 72 hour turn around time   [facility] [place], [place] - [address] Phone:  [phone]  Fax:  [phone]

## 2019-12-13 NOTE — Telephone Encounter (Signed)
Requested medication (s) are due for refill today - no - most Rx filled 2 months ago  Requested medication (s) are on the active medication list -yes  Future visit scheduled -yes- in 2 days  Last refill: 2 months ago or within the week  Notes to clinic: Not sure why getting this request- patient has appointment in 2 days and all these medications have been filled and are not due yet. No change in pharmacy- too soon for RF on all  Requested Prescriptions  Pending Prescriptions Disp Refills   clopidogrel (PLAVIX) 75 MG tablet 90 tablet 0    Sig: Take 1 tablet (75 mg total) by mouth daily with breakfast.      Hematology: Antiplatelets - clopidogrel Failed - 12/13/2019  9:43 AM      Failed - Evaluate AST, ALT within 2 months of therapy initiation.      Passed - ALT in normal range and within 360 days    ALT  Date Value Ref Range Status  11/01/2019 13 0 - 44 IU/L Final          Passed - AST in normal range and within 360 days    AST  Date Value Ref Range Status  11/01/2019 16 0 - 40 IU/L Final          Passed - HCT in normal range and within 180 days    HCT  Date Value Ref Range Status  07/16/2019 41.8 39 - 52 % Final   Hematocrit  Date Value Ref Range Status  07/10/2017 39.5 37.5 - 51.0 % Final          Passed - HGB in normal range and within 180 days    Hemoglobin  Date Value Ref Range Status  07/16/2019 14.1 13.0 - 17.0 g/dL Final  07/10/2017 13.9 13.0 - 17.7 g/dL Final          Passed - PLT in normal range and within 180 days    Platelets  Date Value Ref Range Status  07/16/2019 294 150 - 400 K/uL Final  07/10/2017 330 150 - 379 x10E3/uL Final          Passed - Valid encounter within last 6 months    Recent Outpatient Visits           1 month ago DM (diabetes mellitus), type 2, uncontrolled, with renal complications ([place])   [place], [place], PA-C   4 months ago DM (diabetes mellitus), type 2, uncontrolled, with renal  complications [facility])   [facility] [place], [place]   5 months ago 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 classification [facility])   [facility] [place], [place]   5 months ago Hypertension associated with diabetes [facility])   [facility] [place], [place]   8 months ago Colon cancer screening   [place], Lilia Argue, [place]       Future Appointments             In 2 days Kathrine Haddock, NP [facility], [facility]   In 1 month Johnson, Megan P, DO [name], [facility]   In 10 months  [name], [facility]              enalapril (VASOTEC) 20 MG tablet 90 tablet 1    Sig: Take 1 tablet (20 mg total) by mouth 2 (two) times daily.      Cardiovascular:  ACE Inhibitors Failed -  12/13/2019  9:43 AM      Failed - Cr in normal range and within 180 days    Creatinine, Ser  Date Value Ref Range Status  11/01/2019 1.28 (H) 0.76 - 1.27 mg/dL Final          Passed - K in normal range and within 180 days    Potassium  Date Value Ref Range Status  11/01/2019 4.7 3.5 - 5.2 mmol/L Final          Passed - Patient is not pregnant      Passed - Last BP in normal range    BP Readings from Last 1 Encounters:  11/01/19 118/68          Passed - Valid encounter within last 6 months    Recent Outpatient Visits           1 month ago DM (diabetes mellitus), type 2, uncontrolled, with renal complications [facility])   [place], [place], PA-C   4 months ago DM (diabetes mellitus), type 2, uncontrolled, with renal complications [facility])   [facility] [place], [place]   5 months ago 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 classification [facility])   [facility] [place], [place]   5 months ago Hypertension associated with diabetes [facility])   [facility] [place], [place]   8 months ago Colon cancer screening    [place], Lilia Argue, [place]       Future Appointments             In 2 days Kathrine Haddock, NP [facility], [facility]   In 1 month Johnson, Megan P, DO [name], [facility]   In 10 months  [employer], [facility]              metFORMIN (GLUCOPHAGE-XR) 500 MG 24 hr tablet 360 tablet 0    Sig: Take 2 tablets (1,000 mg total) by mouth 2 (two) times daily.      Endocrinology:  Diabetes - Biguanides Failed - 12/13/2019  9:43 AM      Failed - Cr in normal range and within 360 days    Creatinine, Ser  Date Value Ref Range Status  11/01/2019 1.28 (H) 0.76 - 1.27 mg/dL Final          Passed - HBA1C is between 0 and 7.9 and within 180 days    HB A1C (BAYER DCA - WAIVED)  Date Value Ref Range Status  04/07/2018 11.4 (H) <7.0 % Final    Comment:                                          Diabetic Adult            <7.0                                       Healthy Adult        4.3 - 5.7                                                           (  DCCT/NGSP) American Diabetes Association's Summary of Glycemic Recommendations for Adults with Diabetes: Hemoglobin A1c <7.0%. More stringent glycemic goals (A1c <6.0%) may further reduce complications at the cost of increased risk of hypoglycemia.    Hgb A1c MFr Bld  Date Value Ref Range Status  11/01/2019 7.5 (H) 4.8 - 5.6 % Final    Comment:             Prediabetes: 5.7 - 6.4          Diabetes: >6.4          Glycemic control for adults with diabetes: <7.0           Passed - eGFR in normal range and within 360 days    GFR calc Af Amer  Date Value Ref Range Status  11/01/2019 63 >59 mL/min/1.73 Final    Comment:    **[facility] currently reports eGFR in compliance with the current**   recommendations of the [employer]. [facility] will   update reporting as new guidelines are published from the NKF-ASN   Task force.    GFR calc non Af Amer  Date Value Ref Range Status   11/01/2019 54 (L) >59 mL/min/1.73 Final          Passed - Valid encounter within last 6 months    Recent Outpatient Visits           1 month ago DM (diabetes mellitus), type 2, uncontrolled, with renal complications [facility])   [place], [place], PA-C   4 months ago DM (diabetes mellitus), type 2, uncontrolled, with renal complications [facility])   [facility] [place], [place]   5 months ago 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 classification [facility])   [place], [place], [place]   5 months ago Hypertension associated with diabetes [facility])   [facility] [place], [place]   8 months ago Colon cancer screening   [place], Lilia Argue, [place]       Future Appointments             In 2 days Kathrine Haddock, NP [facility], [facility]   In 1 month Johnson, Megan P, DO [name], [facility]   In 10 months  [name], [facility]              metoprolol tartrate (LOPRESSOR) 25 MG tablet 180 tablet 1    Sig: Take 1 tablet (25 mg total) by mouth 2 (two) times daily.      Cardiovascular:  Beta Blockers Passed - 12/13/2019  9:43 AM      Passed - Last BP in normal range    BP Readings from Last 1 Encounters:  11/01/19 118/68          Passed - Last Heart Rate in normal range    Pulse Readings from Last 1 Encounters:  11/01/19 61          Passed - Valid encounter within last 6 months    Recent Outpatient Visits           1 month ago DM (diabetes mellitus), type 2, uncontrolled, with renal complications [facility])   [place], [place], PA-C   4 months ago DM (diabetes mellitus), type 2, uncontrolled, with renal complications [facility])   [facility] [place], [place]   5 months ago 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 classification [facility])   [facility] Merrie Roof  Benjamine Mola, PA-C   5 months ago  Hypertension associated with diabetes [facility])   [place], [place], [place]   8 months ago Colon cancer screening   [facility] [place], [place]       Future Appointments             In 2 days Kathrine Haddock, NP [facility], [facility]   In 1 month Johnson, Megan P, DO [place], [facility]   In 10 months  [employer], [facility]              Multiple Vitamin (MULTIVITAMIN) TABS 30 tablet     Sig: Take by mouth daily. Gnp century mature multivitamin tab 125c      There is no refill protocol information for this order        Requested Prescriptions  Pending Prescriptions Disp Refills   clopidogrel (PLAVIX) 75 MG tablet 90 tablet 0    Sig: Take 1 tablet (75 mg total) by mouth daily with breakfast.      Hematology: Antiplatelets - clopidogrel Failed - 12/13/2019  9:43 AM      Failed - Evaluate AST, ALT within 2 months of therapy initiation.      Passed - ALT in normal range and within 360 days    ALT  Date Value Ref Range Status  11/01/2019 13 0 - 44 IU/L Final          Passed - AST in normal range and within 360 days    AST  Date Value Ref Range Status  11/01/2019 16 0 - 40 IU/L Final          Passed - HCT in normal range and within 180 days    HCT  Date Value Ref Range Status  07/16/2019 41.8 39 - 52 % Final   Hematocrit  Date Value Ref Range Status  07/10/2017 39.5 37.5 - 51.0 % Final          Passed - HGB in normal range and within 180 days    Hemoglobin  Date Value Ref Range Status  07/16/2019 14.1 13.0 - 17.0 g/dL Final  07/10/2017 13.9 13.0 - 17.7 g/dL Final          Passed - PLT in normal range and within 180 days    Platelets  Date Value Ref Range Status  07/16/2019 294 150 - 400 K/uL Final  07/10/2017 330 150 - 379 x10E3/uL Final          Passed - Valid encounter within last 6 months    Recent Outpatient Visits           1 month ago DM (diabetes mellitus), type  2, uncontrolled, with renal complications [facility])   [place], [place], PA-C   4 months ago DM (diabetes mellitus), type 2, uncontrolled, with renal complications [facility])   [facility] [place], [place]   5 months ago 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 classification [facility])   [place], [place], [place]   5 months ago Hypertension associated with diabetes [facility])   [facility] [place], [place]   8 months ago Colon cancer screening   [facility] [place], [place]       Future Appointments             In 2 days Kathrine Haddock, NP [facility], [place]   In 1 month [place], [place],  DO [name], [facility]   In 10 months  [name], [facility]              enalapril (VASOTEC) 20 MG tablet 90 tablet 1    Sig: Take 1 tablet (20 mg total) by mouth 2 (two) times daily.      Cardiovascular:  ACE Inhibitors Failed - 12/13/2019  9:43 AM      Failed - Cr in normal range and within 180 days    Creatinine, Ser  Date Value Ref Range Status  11/01/2019 1.28 (H) 0.76 - 1.27 mg/dL Final          Passed - K in normal range and within 180 days    Potassium  Date Value Ref Range Status  11/01/2019 4.7 3.5 - 5.2 mmol/L Final          Passed - Patient is not pregnant      Passed - Last BP in normal range    BP Readings from Last 1 Encounters:  11/01/19 118/68          Passed - Valid encounter within last 6 months    Recent Outpatient Visits           1 month ago DM (diabetes mellitus), type 2, uncontrolled, with renal complications [facility])   [place], [place], PA-C   4 months ago DM (diabetes mellitus), type 2, uncontrolled, with renal complications [facility])   [facility] [place], [place]   5 months ago 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 classification [facility])   [facility] [place], [place]   5 months ago Hypertension associated with diabetes [facility])   [facility] [place], [place]   8 months ago Colon cancer screening   [place], Lilia Arg[place], [place]       Future Appointments             In 2 days Kathrine Haddock, NP [facility], [facility]   In 1 month Johnson, Megan P, DO [name], [facility]   In 10 months  [employer], [facility]              metFORMIN (GLUCOPHAGE-XR) 500 MG 24 hr tablet 360 tablet 0    Sig: Take 2 tablets (1,000 mg total) by mouth 2 (two) times daily.      Endocrinology:  Diabetes - Biguanides Failed - 12/13/2019  9:43 AM      Failed - Cr in normal range and within 360 days    Creatinine, Ser  Date Value Ref Range Status  11/01/2019 1.28 (H) 0.76 - 1.27 mg/dL Final          Passed - HBA1C is between 0 and 7.9 and within 180 days    HB A1C (BAYER DCA - WAIVED)  Date Value Ref Range Status  04/07/2018 11.4 (H) <7.0 % Final    Comment:                                          Diabetic Adult            <7.0                                       Healthy  Adult        4.3 - 5.7                                                           (DCCT/NGSP) American Diabetes Association's Summary of Glycemic Recommendations for Adults with Diabetes: Hemoglobin A1c <7.0%. More stringent glycemic goals (A1c <6.0%) may further reduce complications at the cost of increased risk of hypoglycemia.    Hgb A1c MFr Bld  Date Value Ref Range Status  11/01/2019 7.5 (H) 4.8 - 5.6 % Final    Comment:             Prediabetes: 5.7 - 6.4          Diabetes: >6.4          Glycemic control for adults with diabetes: <7.0           Passed - eGFR in normal range and within 360 days    GFR calc Af Amer  Date Value Ref Range Status  11/01/2019 63 >59 mL/min/1.73 Final    Comment:    **[facility] currently reports eGFR in compliance with the current**   recommendations of the [facility]. [facility] will   update reporting as new guidelines are published from the NKF-ASN   Task force.    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 classification [facility])   [place], [place], [place]   5 months ago Hypertension associated with diabetes [facility])   [facility] [place], [place]   8 months ago Colon cancer screening   [place], Lilia Argue, [place]       Future Appointments             In 2 days Kathrine Haddock, NP [facility], [facility]   In 1 month Johnson, Megan P, DO [name], [facility]   In 10 months  [name], [facility]              metoprolol tartrate (LOPRESSOR) 25 MG tablet 180 tablet 1    Sig: Take 1 tablet (25 mg total) by mouth 2 (two) times daily.      Cardiovascular:  Beta Blockers Passed - 12/13/2019  9:43 AM      Passed - Last BP in normal range    BP Readings from Last 1 Encounters:  11/01/19 118/68          Passed - Last Heart Rate in normal range    Pulse Readings from Last 1 Encounters:  11/01/19 61          Passed - Valid encounter within last 6 months    Recent Outpatient Visits  1 month ago DM (diabetes mellitus), type 2, uncontrolled, with renal complications [facility])   [place], [place], [place]   4 months ago DM (diabetes mellitus), type 2, uncontrolled, with renal complications [facility])   [facility] [place], [place]   5 months ago 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 [place], [place]       Future Appointments             In 2 days Kathrine Haddock, NP [employer], [facility]   In 1 month Johnson, Megan P, DO [place], [facility]   In 10 months  [employer], [facility]              Multiple Vitamin (MULTIVITAMIN) TABS 30 tablet     Sig: Take by mouth daily. Gnp century mature multivitamin tab 125c      There is no refill protocol information for this order

## 2019-12-14 ENCOUNTER — Other Ambulatory Visit: Payer: Self-pay

## 2019-12-14 MED ORDER — MULTIVITAMIN PO TABS
1.0000 | ORAL_TABLET | Freq: Every day | ORAL | 6 refills | Status: DC
Start: 2019-12-14 — End: 2020-07-23

## 2019-12-14 NOTE — Telephone Encounter (Signed)
Fax from [employer]

## 2019-12-15 ENCOUNTER — Ambulatory Visit: Payer: Medicare HMO | Admitting: Unknown Physician Specialty

## 2019-12-24 DIAGNOSIS — J440 Chronic obstructive pulmonary disease with acute lower respiratory infection: Secondary | ICD-10-CM | POA: Diagnosis not present

## 2019-12-24 DIAGNOSIS — J209 Acute bronchitis, unspecified: Secondary | ICD-10-CM | POA: Diagnosis not present

## 2019-12-26 DIAGNOSIS — N189 Chronic kidney disease, unspecified: Secondary | ICD-10-CM | POA: Diagnosis not present

## 2019-12-26 DIAGNOSIS — I2510 Atherosclerotic heart disease of native coronary artery without angina pectoris: Secondary | ICD-10-CM | POA: Diagnosis not present

## 2019-12-26 DIAGNOSIS — I110 Hypertensive heart disease with heart failure: Secondary | ICD-10-CM | POA: Diagnosis not present

## 2019-12-26 DIAGNOSIS — R600 Localized edema: Secondary | ICD-10-CM | POA: Diagnosis not present

## 2019-12-26 DIAGNOSIS — R0600 Dyspnea, unspecified: Secondary | ICD-10-CM | POA: Diagnosis not present

## 2019-12-26 DIAGNOSIS — I5020 Unspecified systolic (congestive) heart failure: Secondary | ICD-10-CM | POA: Insufficient documentation

## 2019-12-26 DIAGNOSIS — I272 Pulmonary hypertension, unspecified: Secondary | ICD-10-CM | POA: Diagnosis not present

## 2019-12-26 DIAGNOSIS — N183 Chronic kidney disease, stage 3 unspecified: Secondary | ICD-10-CM | POA: Diagnosis not present

## 2019-12-26 DIAGNOSIS — J449 Chronic obstructive pulmonary disease, unspecified: Secondary | ICD-10-CM | POA: Diagnosis not present

## 2019-12-26 DIAGNOSIS — I081 Rheumatic disorders of both mitral and tricuspid valves: Secondary | ICD-10-CM | POA: Diagnosis not present

## 2019-12-26 DIAGNOSIS — I5023 Acute on chronic systolic (congestive) heart failure: Secondary | ICD-10-CM | POA: Diagnosis not present

## 2019-12-26 DIAGNOSIS — I130 Hypertensive heart and chronic kidney disease with heart failure and stage 1 through stage 4 chronic kidney disease, or unspecified chronic kidney disease: Secondary | ICD-10-CM | POA: Diagnosis not present

## 2019-12-26 DIAGNOSIS — E1122 Type 2 diabetes mellitus with diabetic chronic kidney disease: Secondary | ICD-10-CM | POA: Diagnosis not present

## 2019-12-26 DIAGNOSIS — Z794 Long term (current) use of insulin: Secondary | ICD-10-CM | POA: Diagnosis not present

## 2019-12-26 DIAGNOSIS — Z20822 Contact with and (suspected) exposure to covid-19: Secondary | ICD-10-CM | POA: Diagnosis not present

## 2019-12-26 DIAGNOSIS — J9601 Acute respiratory failure with hypoxia: Secondary | ICD-10-CM | POA: Diagnosis not present

## 2019-12-26 DIAGNOSIS — I509 Heart failure, unspecified: Secondary | ICD-10-CM | POA: Diagnosis not present

## 2019-12-26 DIAGNOSIS — R918 Other nonspecific abnormal finding of lung field: Secondary | ICD-10-CM | POA: Diagnosis not present

## 2019-12-26 DIAGNOSIS — E1121 Type 2 diabetes mellitus with diabetic nephropathy: Secondary | ICD-10-CM | POA: Diagnosis not present

## 2019-12-26 DIAGNOSIS — N400 Enlarged prostate without lower urinary tract symptoms: Secondary | ICD-10-CM | POA: Diagnosis not present

## 2019-12-27 DIAGNOSIS — N189 Chronic kidney disease, unspecified: Secondary | ICD-10-CM | POA: Diagnosis not present

## 2019-12-27 DIAGNOSIS — I272 Pulmonary hypertension, unspecified: Secondary | ICD-10-CM | POA: Diagnosis not present

## 2019-12-27 DIAGNOSIS — I2510 Atherosclerotic heart disease of native coronary artery without angina pectoris: Secondary | ICD-10-CM | POA: Diagnosis not present

## 2019-12-27 DIAGNOSIS — E1122 Type 2 diabetes mellitus with diabetic chronic kidney disease: Secondary | ICD-10-CM | POA: Diagnosis not present

## 2019-12-27 DIAGNOSIS — J449 Chronic obstructive pulmonary disease, unspecified: Secondary | ICD-10-CM | POA: Diagnosis not present

## 2019-12-27 DIAGNOSIS — J9601 Acute respiratory failure with hypoxia: Secondary | ICD-10-CM | POA: Diagnosis not present

## 2019-12-27 DIAGNOSIS — I5023 Acute on chronic systolic (congestive) heart failure: Secondary | ICD-10-CM | POA: Diagnosis not present

## 2019-12-27 DIAGNOSIS — I081 Rheumatic disorders of both mitral and tricuspid valves: Secondary | ICD-10-CM | POA: Diagnosis not present

## 2019-12-28 DIAGNOSIS — J9601 Acute respiratory failure with hypoxia: Secondary | ICD-10-CM | POA: Diagnosis not present

## 2019-12-28 DIAGNOSIS — N189 Chronic kidney disease, unspecified: Secondary | ICD-10-CM | POA: Diagnosis not present

## 2019-12-28 DIAGNOSIS — E1122 Type 2 diabetes mellitus with diabetic chronic kidney disease: Secondary | ICD-10-CM | POA: Diagnosis not present

## 2019-12-28 DIAGNOSIS — I2510 Atherosclerotic heart disease of native coronary artery without angina pectoris: Secondary | ICD-10-CM | POA: Diagnosis not present

## 2019-12-28 DIAGNOSIS — I5023 Acute on chronic systolic (congestive) heart failure: Secondary | ICD-10-CM | POA: Diagnosis not present

## 2019-12-28 DIAGNOSIS — J449 Chronic obstructive pulmonary disease, unspecified: Secondary | ICD-10-CM | POA: Diagnosis not present

## 2019-12-29 ENCOUNTER — Telehealth: Payer: Self-pay | Admitting: Pharmacist

## 2019-12-29 NOTE — Progress Notes (Deleted)
Chronic Care Management Pharmacy Assistant   Name: Benjamin Chan  MRN: [id number] DOB: Nov 09, 1945  Reason for Encounter: Patient Assistance Coordination  Spoke with Antony Odea to request refills of Basaglar Kwikpen 100 Unit/ML.  PCP : [name], PA-C  Allergies:   Allergies  Allergen Reactions   Brilinta [Ticagrelor] Shortness Of Breath    D/c by cardiology    Medications: Outpatient Encounter Medications as of 12/29/2019  Medication Sig Note   acetaminophen (TYLENOL) 325 MG tablet Take 650 mg by mouth every 6 (six) hours as needed.    albuterol (PROVENTIL) (2.5 MG/3ML) 0.083% nebulizer solution Take 3 mLs (2.5 mg total) by nebulization every 6 (six) hours as needed for wheezing or shortness of breath.    albuterol (VENTOLIN HFA) 108 (90 Base) MCG/ACT inhaler Inhale 2 puffs into the lungs every 6 (six) hours as needed for wheezing or shortness of breath.    amLODipine (NORVASC) 10 MG tablet Take 0.5 tablets (5 mg total) by mouth daily.    aspirin EC 81 MG tablet Take 1 tablet (81 mg total) by mouth daily.    atorvastatin (LIPITOR) 80 MG tablet Take 1 tablet (80 mg total) by mouth daily.    clopidogrel (PLAVIX) 75 MG tablet Take 1 tablet (75 mg total) by mouth daily with breakfast.    Dulaglutide (TRULICITY) 0.75 MG/0.5ML SOPN Inject 0.75 mg into the skin once a week.    enalapril (VASOTEC) 20 MG tablet Take 1 tablet (20 mg total) by mouth 2 (two) times daily.    ezetimibe (ZETIA) 10 MG tablet Take 1 tablet (10 mg total) by mouth daily.    finasteride (PROSCAR) 5 MG tablet Take 1 tablet (5 mg total) by mouth daily.    fluticasone (FLONASE) 50 MCG/ACT nasal spray Place 1 spray into both nostrils 2 (two) times daily. Pt takes twice a day 05/25/2019: Using PRN   Fluticasone-Umeclidin-Vilant (TRELEGY ELLIPTA) 100-62.5-25 MCG/INH AEPB Inhale 1 puff into the lungs daily. (Patient not taking: Reported on 11/24/2019)    glipiZIDE (GLUCOTROL) 5 MG tablet  TAKE ONE TABLET BY MOUTH EVERY MORNING BEFORE BREAKFAST    insulin degludec (TRESIBA FLEXTOUCH) 100 UNIT/ML FlexTouch Pen Inject 0.45 mLs (45 Units total) into the skin daily. (Patient not taking: Reported on 11/24/2019) 11/24/2019: Patient receives Basaglar through PAP   Insulin Glargine (BASAGLAR KWIKPEN) 100 UNIT/ML Inject 40 Units into the skin in the morning.    JARDIANCE 25 MG TABS tablet TAKE ONE TABLET BY MOUTH ONCE DAILY    metFORMIN (GLUCOPHAGE-XR) 500 MG 24 hr tablet Take 2 tablets (1,000 mg total) by mouth 2 (two) times daily.    metoprolol tartrate (LOPRESSOR) 25 MG tablet Take 1 tablet (25 mg total) by mouth 2 (two) times daily.    Multiple Vitamin (MULTIVITAMIN) TABS Take 1 tablet by mouth daily. Gnp century mature multivitamin tab 125c    nitroGLYCERIN (NITROSTAT) 0.4 MG SL tablet Place 1 tablet (0.4 mg total) under the tongue every 5 (five) minutes as needed for chest pain.    No facility-administered encounter medications on file as of 12/29/2019.    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 heart 04/14/2017   Benign essential HTN 04/14/2017   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 classification (HCC) 04/13/2017   BPH (benign prostatic hyperplasia) 04/13/2017   Benign hypertensive renal disease 04/13/2017   DM (diabetes mellitus), type 2, uncontrolled, with renal complications (HCC) 04/13/2017   History  of myocardial infarction 04/13/2017   Diabetic neuropathy (HCC) 06/12/2016   Essential hypertension 08/26/2012   CAD (coronary artery disease) 08/26/2012   ASD (atrial septal defect), ostium secundum 10/24/1997     Follow-Up:  Patient Assistance Coordination and Pharmacist Review-Per [employer]; refill form for [employer] located on website ([email or web address]) can be completed by prescriber and faxed to  [phone]. Patient's end date for [name] assistance is 03/16/20 and will need to re-enroll by 01/16/2020 to continue assistance for both Basaglar and Trulicity.

## 2020-01-03 NOTE — Chronic Care Management (AMB) (Addendum)
Chronic Care Management Pharmacy Assistant   Name: Benjamin Chan  MRN: [id number] DOB: 13-Jun-1945  Reason for Encounter:Diabetes Disease State Call  Patient Questions:  1.  Have you seen any other providers since your last visit? Yes, the patient was admitted in [facility] on Monday 12/26/19 and was discharged on Wednesday 12/28/19 due to fluid building up in his lungs and complications with his COPD.      2.  Any changes in your medicines or health? Yes, the patient's wife was unable to voice these changes as Benjamin Chan was not home at this time.  PCP : [name], PA-C  Allergies:   Allergies  Allergen Reactions   Brilinta [Ticagrelor] Shortness Of Breath    D/c by cardiology    Medications: Outpatient Encounter Medications as of 12/29/2019  Medication Sig Note   acetaminophen (TYLENOL) 325 MG tablet Take 650 mg by mouth every 6 (six) hours as needed.    albuterol (PROVENTIL) (2.5 MG/3ML) 0.083% nebulizer solution Take 3 mLs (2.5 mg total) by nebulization every 6 (six) hours as needed for wheezing or shortness of breath.    albuterol (VENTOLIN HFA) 108 (90 Base) MCG/ACT inhaler Inhale 2 puffs into the lungs every 6 (six) hours as needed for wheezing or shortness of breath.    amLODipine (NORVASC) 10 MG tablet Take 0.5 tablets (5 mg total) by mouth daily.    aspirin EC 81 MG tablet Take 1 tablet (81 mg total) by mouth daily.    atorvastatin (LIPITOR) 80 MG tablet Take 1 tablet (80 mg total) by mouth daily.    clopidogrel (PLAVIX) 75 MG tablet Take 1 tablet (75 mg total) by mouth daily with breakfast.    Dulaglutide (TRULICITY) 0.75 MG/0.5ML SOPN Inject 0.75 mg into the skin once a week.    enalapril (VASOTEC) 20 MG tablet Take 1 tablet (20 mg total) by mouth 2 (two) times daily.    ezetimibe (ZETIA) 10 MG tablet Take 1 tablet (10 mg total) by mouth daily.    finasteride (PROSCAR) 5 MG tablet Take 1 tablet (5 mg total) by mouth daily.     fluticasone (FLONASE) 50 MCG/ACT nasal spray Place 1 spray into both nostrils 2 (two) times daily. Pt takes twice a day 05/25/2019: Using PRN   Fluticasone-Umeclidin-Vilant (TRELEGY ELLIPTA) 100-62.5-25 MCG/INH AEPB Inhale 1 puff into the lungs daily. (Patient not taking: Reported on 11/24/2019)    glipiZIDE (GLUCOTROL) 5 MG tablet TAKE ONE TABLET BY MOUTH EVERY MORNING BEFORE BREAKFAST    insulin degludec (TRESIBA FLEXTOUCH) 100 UNIT/ML FlexTouch Pen Inject 0.45 mLs (45 Units total) into the skin daily. (Patient not taking: Reported on 11/24/2019) 11/24/2019: Patient receives Basaglar through PAP   Insulin Glargine (BASAGLAR KWIKPEN) 100 UNIT/ML Inject 40 Units into the skin in the morning.    JARDIANCE 25 MG TABS tablet TAKE ONE TABLET BY MOUTH ONCE DAILY    metFORMIN (GLUCOPHAGE-XR) 500 MG 24 hr tablet Take 2 tablets (1,000 mg total) by mouth 2 (two) times daily.    metoprolol tartrate (LOPRESSOR) 25 MG tablet Take 1 tablet (25 mg total) by mouth 2 (two) times daily.    Multiple Vitamin (MULTIVITAMIN) TABS Take 1 tablet by mouth daily. Gnp century mature multivitamin tab 125c    nitroGLYCERIN (NITROSTAT) 0.4 MG SL tablet Place 1 tablet (0.4 mg total) under the tongue every 5 (five) minutes as needed for chest pain.    No facility-administered encounter medications on file as of 12/29/2019.  Current Diagnosis: Patient Active Problem List   Diagnosis Date Noted   Diabetic ketoacidosis without coma associated with type 2 diabetes mellitus (HCC)    NSTEMI (non-ST elevated myocardial infarction) (HCC) 07/14/2019   Special screening for malignant neoplasms, colon    CKD stage 3 due to type 2 diabetes mellitus (HCC) 11/23/2017   Hyperlipidemia 07/10/2017   Venous insufficiency of both lower extremities 06/10/2017   Non-ST elevation myocardial infarction (NSTEMI), subendocardial infarction, subsequent episode of care (HCC) 04/14/2017   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  heart 04/14/2017   Benign essential HTN 04/14/2017   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 classification (HCC) 04/13/2017   BPH (benign prostatic hyperplasia) 04/13/2017   Benign hypertensive renal disease 04/13/2017   DM (diabetes mellitus), type 2, uncontrolled, with renal complications (HCC) 04/13/2017   History of myocardial infarction 04/13/2017   Diabetic neuropathy (HCC) 06/12/2016   Essential hypertension 08/26/2012   CAD (coronary artery disease) 08/26/2012   ASD (atrial septal defect), ostium secundum 10/24/1997   Recent Relevant Labs: Lab Results  Component Value Date/Time   HGBA1C 7.5 (H) 11/01/2019 08:37 AM   HGBA1C 7.6 (H) 06/27/2019 10:25 AM   HGBA1C 11.4 (H) 04/07/2018 10:24 AM   HGBA1C 9.5 (H) 11/23/2017 08:48 AM    Kidney Function Lab Results  Component Value Date/Time   CREATININE 1.28 (H) 11/01/2019 08:37 AM   CREATININE 1.54 (H) 07/25/2019 03:12 PM   GFRNONAA 54 (L) 11/01/2019 08:37 AM   GFRAA 63 11/01/2019 08:37 AM    Current antihyperglycemic regimen:  Basaglar 40 units (injected into the skin in the morning), Glipizide 5mg  (take 1 tablet by mouth every morning before breakfast), Metformin 500mg  24 hr Tablet (take 2 tablets, 2 times daily), Jardiance 25mg  (once daily), Trulicity 0.75mg  (inject into the skin once a week).  What recent interventions/DTPs have been made to improve glycemic control:  Per office visit on 11/23/19 with , CPP; encouraged patient to resume testing glucose 2 times a day and to request refills prior to using last strip in future, encouraged patient to eat regular meals to help avoid lows.  Have there been any recent hospitalizations or ED visits since last visit with CPP? Yes   Patient denies hypoglycemic symptoms, including Pale, Sweaty, Shaky, Hungry, Nervous/irritable and Vision changes.   Patient denies hyperglycemic symptoms, including blurry vision, excessive thirst, fatigue, polyuria and weakness.  How often are you  checking your blood sugar? twice daily, in the morning before eating or drinking and at bedtime   What are your blood sugars ranging?  Fasting: 159 on 01/03/20 Before meals: n/a After meals: n/a Bedtime: 124 on 01/02/20  During the week, how often does your blood glucose drop below 70? Never   Are you checking your feet daily/regularly? Patient does check his feet daily with no issues at this time  Adherence Review: Is the patient currently on a STATIN medication? Yes Is the patient currently on ACE/ARB medication? Yes Does the patient have >5 day gap between last estimated fill dates? No   Goals Addressed               This Visit's Progress     PharmD "We want to keep his blood sugars better controlled" (pt-stated)   On track     CARE PLAN ENTRY (see longtitudinal plan of care for additional care plan information)  Current Barriers:  Diabetes: uncontrolled though improved, A1c 7.5% Current antihyperglycemic regimen: Basaglar 40 units QAM, glipizide 5 mg  QAM, metformin 1000 mg BID, Jardiance 25 mg, Trulicity 0.75 mg weekly APPROVED for Jardiance assistance through [employer] through 03/16/20 APPROVED for Basaglar and Trulicity assistance through [place] through 03/16/20 Current glucose readings:  Reports he is picking up strips today and has been out for about one week and not checking. Reports one BG reading in the 80's around 8/17 but otherwise none below 100-110. He reports previously feeling "nervous" when he thought his sugar was low. He reports usually skipping breakfast and is having his first meal of the day during our meeting at 13:32.  Cardiovascular risk reduction (CAD s/p NSTEMI 1+ years ago, Followed by Dr. Gwen Pounds); LHC did not show areas amenable to PCI, medical management recommended; ECHO showed EF 50-55% Current hypertensive regimen: enalapril 20 mg BID, metoprolol tartrate 25 mg BID; amlodipine 5 mg daily Current hyperlipidemia regimen: atorvastatin 80 mg daily,  ezetimibe 10 mg daily; LDL at goal <70 COPD: Trelegy daily, albuterol HFA PRN. He is not taking Trelegy and denies and shortness of breath. He states this is too expensive. Will pursue PAP for this medication. BPH: finasteride 5 mg daily   Pharmacist Clinical Goal(s):  Over the next 90 days, patient with work with PharmD and primary care provider to address optimized medication management  Interventions: Comprehensive medication review performed, medication list updated in electronic medical record Inter-disciplinary care team collaboration (see longitudinal plan of care) Spoke with patient and spouse today. Patient verified he is taking 40 units of Basaglar daily. Encouraged patient to resume testing BG bid and to request refills prior to using last strip in future. He denies symptoms of hypoglycemia and encouraged him to check BG when he feels "nervous" in the future and report any readings below 70 to MD. Encouraged patient to eat regular meals to help avoid lows. He voiced understanding. Patient and wife were out to lunch and could not verify amount of Trulicity and Basaglar on hand. Doesn't think he needs refills at this time. Will ask CMA to call [name] for verification.   Patient Self Care Activities:  Patient will check blood glucose BID , document, and provide at future appointments Patient will take medications as prescribed Patient will report any questions or concerns to provider   Please see past updates related to this goal by clicking on the "Past Updates" button in the selected goal          01/03/20- CPA spoke with the patient's wife regarding the patient's blood sugar monitoring today. The patient's wife voiced the patient's glucose has been ranging at 124, 141, 107, 167, 97, 157, and 137. Mrs.Stumpo also states the patient was admitted to [facility] on 12/26/19 and discharged on Wednesday 12/28/19 due to fluid building up in his lungs and complications with  his COPD. Medication changes was made while in hospital however the patient's wife could not go over and stated she will bring in list at next visit with Dr. Laural Benes on 01/05/20. The patient's wife states Mr. Petrides eats once or twice a day and has been eating less. Mrs.Turton mentioned the patient had cheese crackers for breakfast today. CPA went over medications with Mrs.Hugh and verified the patient has an adequate supply of injectables (Trulicity 0.75mg  and Basaglar 40 units) that he receives through [employer]. The patient's wife confirmed the patient does not have complaints with his blood sugars since his last visit with [name], CPP. The patient's wife explained Mr. Balthazor has no complications while taking his current regimen of Glipizide 5mg  (  1 tablet by mouth every morning before breakfast), Metformin 500mg  24 hr Tablet (take 2 tablets, 2 times daily), Jardiance 25mg  (once daily), and injectables of Trulicity 0.75mg  (inject into the skin once a week), and Basaglar 40 units (injected into the skin in the morning). , CPP notified.    12/29/19-Per [name]; refill form for [name] located on website ([email or web address]) can be completed by prescriber and faxed to  [phone]. Patient's end date for [name] assistance is 03/16/20 and will need to re-enroll after 01/16/2020 to continue assistance for both Basaglar and Trulicity.   03/18/20, CMA Clinical Pharmicist Assistant [phone]   Follow-Up:  Patient Assistance Coordination and Pharmacist Review

## 2020-01-04 ENCOUNTER — Telehealth: Payer: Self-pay | Admitting: Pharmacist

## 2020-01-05 ENCOUNTER — Other Ambulatory Visit: Payer: Self-pay

## 2020-01-05 ENCOUNTER — Ambulatory Visit
Admission: RE | Admit: 2020-01-05 | Discharge: 2020-01-05 | Disposition: A | Discharge status: Home / Self Care | Payer: Medicare HMO | Source: Ambulatory Visit | Attending: Family Medicine | Admitting: Family Medicine

## 2020-01-05 ENCOUNTER — Encounter: Payer: Self-pay | Admitting: Family Medicine

## 2020-01-05 ENCOUNTER — Ambulatory Visit
Admission: RE | Admit: 2020-01-05 | Discharge: 2020-01-05 | Disposition: A | Discharge status: Home / Self Care | Payer: Medicare HMO | Attending: Family Medicine | Admitting: Family Medicine

## 2020-01-05 ENCOUNTER — Ambulatory Visit (INDEPENDENT_AMBULATORY_CARE_PROVIDER_SITE_OTHER): Payer: Medicare HMO | Admitting: Family Medicine

## 2020-01-05 VITALS — BP 92/59 | HR 84 | Temp 98.2°F | Resp 16 | Ht 66.0 in | Wt 190.2 lb

## 2020-01-05 DIAGNOSIS — N183 Chronic kidney disease, stage 3 unspecified: Secondary | ICD-10-CM

## 2020-01-05 DIAGNOSIS — R0602 Shortness of breath: Secondary | ICD-10-CM

## 2020-01-05 DIAGNOSIS — J449 Chronic obstructive pulmonary disease, unspecified: Secondary | ICD-10-CM

## 2020-01-05 DIAGNOSIS — E1122 Type 2 diabetes mellitus with diabetic chronic kidney disease: Secondary | ICD-10-CM | POA: Diagnosis not present

## 2020-01-05 DIAGNOSIS — E1129 Type 2 diabetes mellitus with other diabetic kidney complication: Secondary | ICD-10-CM | POA: Diagnosis not present

## 2020-01-05 DIAGNOSIS — I509 Heart failure, unspecified: Secondary | ICD-10-CM | POA: Insufficient documentation

## 2020-01-05 DIAGNOSIS — I152 Hypertension secondary to endocrine disorders: Secondary | ICD-10-CM | POA: Diagnosis not present

## 2020-01-05 DIAGNOSIS — R0989 Other specified symptoms and signs involving the circulatory and respiratory systems: Secondary | ICD-10-CM | POA: Diagnosis not present

## 2020-01-05 DIAGNOSIS — J9601 Acute respiratory failure with hypoxia: Secondary | ICD-10-CM | POA: Diagnosis not present

## 2020-01-05 DIAGNOSIS — E785 Hyperlipidemia, unspecified: Secondary | ICD-10-CM

## 2020-01-05 DIAGNOSIS — I517 Cardiomegaly: Secondary | ICD-10-CM | POA: Diagnosis not present

## 2020-01-05 DIAGNOSIS — E1165 Type 2 diabetes mellitus with hyperglycemia: Secondary | ICD-10-CM | POA: Diagnosis not present

## 2020-01-05 DIAGNOSIS — E1159 Type 2 diabetes mellitus with other circulatory complications: Secondary | ICD-10-CM | POA: Diagnosis not present

## 2020-01-05 DIAGNOSIS — IMO0002 Reserved for concepts with insufficient information to code with codable children: Secondary | ICD-10-CM

## 2020-01-05 LAB — BAYER DCA HB A1C WAIVED: HB A1C (BAYER DCA - WAIVED): 6.6 % (ref <7.0)

## 2020-01-05 NOTE — Assessment & Plan Note (Signed)
Rechecking labs today. Await results. Continue current regimen. Continue to monitor. Call with any concerns.  

## 2020-01-05 NOTE — Progress Notes (Signed)
BP (!) 92/59 (BP Location: Left Arm, Patient Position: Sitting, Cuff Size: Normal)    Pulse 84    Temp 98.2 F (36.8 C) (Oral)    Resp 16    Ht 5\' 6"  (1.676 m)    Wt 190 lb 3.2 oz (86.3 kg)    SpO2 94%    BMI 30.70 kg/m    Subjective:    Patient ID: Benjamin Chan, male    DOB: 03/30/1945, 74 y.o.   MRN: [id number]  HPI: Benjamin Chan is a 74 y.o. male  Chief Complaint  Patient presents with   Hospitalization Follow-up   Transition of Care Hospital Follow up.   Hospital/Facility: [facility] D/C Physician: Dr. 73  D/C Date: 12/28/19  Records Requested: 01/05/20 Records Received: 01/05/20 Records Reviewed: 01/05/20  Diagnoses on Discharge: Acute on chronic congestive heart failure, unspecified heart failure type (CMS-HCC) (Primary Dx); Type 2 diabetes mellitus with diabetic neuropathy, with long-term current use of insulin (CMS-HCC);  HFrEF (heart failure with reduced ejection fraction) (CMS-HCC)  Date of interactive Contact within 48 hours of discharge: 12/29/19 Contact was through: phone  Date of 7 day or 14 day face-to-face visit: 01/05/20   within 14 days  Outpatient Encounter Medications as of 01/05/2020  Medication Sig Note   acetaminophen (TYLENOL) 325 MG tablet Take 650 mg by mouth every 6 (six) hours as needed.    aspirin EC 81 MG tablet Take 1 tablet (81 mg total) by mouth daily.    atorvastatin (LIPITOR) 80 MG tablet Take 1 tablet (80 mg total) by mouth daily.    clopidogrel (PLAVIX) 75 MG tablet Take 1 tablet (75 mg total) by mouth daily with breakfast.    Dulaglutide (TRULICITY) 0.75 MG/0.5ML SOPN Inject 0.75 mg into the skin once a week.    ezetimibe (ZETIA) 10 MG tablet Take 1 tablet (10 mg total) by mouth daily.    finasteride (PROSCAR) 5 MG tablet Take 1 tablet (5 mg total) by mouth daily.    fluticasone (FLONASE) 50 MCG/ACT nasal spray Place 1 spray into both nostrils 2 (two) times daily. Pt takes twice a day 05/25/2019: Using PRN    Fluticasone-Umeclidin-Vilant (TRELEGY ELLIPTA) 100-62.5-25 MCG/INH AEPB Inhale 1 puff into the lungs daily.    furosemide (LASIX) 40 MG tablet Take by mouth.    glipiZIDE (GLUCOTROL) 5 MG tablet TAKE ONE TABLET BY MOUTH EVERY MORNING BEFORE BREAKFAST    insulin degludec (TRESIBA FLEXTOUCH) 100 UNIT/ML FlexTouch Pen Inject 0.45 mLs (45 Units total) into the skin daily. 11/24/2019: Patient receives Basaglar through P[facility]   Insulin Glargine (BASAGLAR KWIKPEN) 100 UNIT/ML Inject 40 Units into the skin in the morning.    JARDIANCE 25 MG TABS tablet TAKE ONE TABLET BY MOUTH ONCE DAILY    losartan (COZAAR) 100 MG tablet Take by mouth.    metFORMIN (GLUCOPHAGE-XR) 500 MG 24 hr tablet Take 2 tablets (1,000 mg total) by mouth 2 (two) times daily.    metoprolol tartrate (LOPRESSOR) 25 MG tablet Take 1 tablet (25 mg total) by mouth 2 (two) times daily.    Multiple Vitamin (MULTIVITAMIN) TABS Take 1 tablet by mouth daily. Gnp century mature multivitamin tab 125c    nitroGLYCERIN (NITROSTAT) 0.4 MG SL tablet Place 1 tablet (0.4 mg total) under the tongue every 5 (five) minutes as needed for chest pain.    spironolactone (ALDACTONE) 25 MG tablet Take by mouth.    albuterol (PROVENTIL) (2.5 MG/3ML) 0.083% nebulizer solution Take 3 mLs (2.5 mg total) by nebulization  every 6 (six) hours as needed for wheezing or shortness of breath. (Patient not taking: Reported on 01/05/2020)    albuterol (VENTOLIN HFA) 108 (90 Base) MCG/ACT inhaler Inhale 2 puffs into the lungs every 6 (six) hours as needed for wheezing or shortness of breath. (Patient not taking: Reported on 01/05/2020)    [DISCONTINUED] amLODipine (NORVASC) 10 MG tablet Take 0.5 tablets (5 mg total) by mouth daily. (Patient not taking: Reported on 01/05/2020)    [DISCONTINUED] enalapril (VASOTEC) 20 MG tablet Take 1 tablet (20 mg total) by mouth 2 (two) times daily. (Patient not taking: Reported on 01/05/2020)    No facility-administered encounter  medications on file as of 01/05/2020.  Per Hospitalist: "Hospital Course: KALID GHAN is a 74 y.o. male with a PMHx of HTN, DM2, HLD, who presents for 2 weeks of shortness of breath,orthopnea,PND,DOE.Found to have new HFrEF (35%) with moderate MR & pHTN.   HFrEF (EF 35%)  acute hypoxic respiratory failure 2 weeks of SOB/DOE with LE edema, orthopnea c/w HF exacerbation. Symptomatically improved with diuresis, Lasix naive. Formal TTE with newly reduced EF of 35%, down from reported 50% in April at [facility] in [place]. Endorses CP several months ago which may have represented missed MI. Unable to obtain LHC films from OSH. Starting patient on additional GDMT for CHF. Patient was diuresed well, comfortable on room air with much improved volume exam at discharge. -Start spironolactone 12.5 mg daily -Start losartan 100 mg daily -Change metoprolol to succinate 25 mg daily -Lasix 40 mg twice daily until follow-up in CHF clinic -Entresto would be ideal, however patient likely to have to pay > $90 per month for this medication. Can consider in the future if it becomes affordable. -Referred to CHF clinic -Discontinue amlodipine, BP well controlled on HFrEF meds  COPD Patient without wheezing on exam adherent with Spiriva at home not needing significantly more albuterol. Suspect that dyspnea more related to volume overload given initial exam. Weaning O2 off on 10/12. -Continue home Ellipta  DM2 Patient reports taking Metformin, 40 units of Lantus nightly, glipizide, ozempic for diabetes,most recent A1c 7.5.Spoke with pharmacy and they were not able to find fill history for Lantus. Requiring much less insulin while inpatient, will dc home dose given that patient is on Ozempic and glipizide which she has not been getting here.  -Discontinue home Lantus  CKD Creatinine on presentation improved from CKD in 2017. Likely diabetic nephropathy. On enalapril 20 twice daily, may transition to  [facility] in the future, however for now transitioning to valsartan.   CAD Remote history ofCABG. History ofmultiplePCIs. Most recently had NSTEMI in April 2021 with cath that found 95% proximal stenosis of LAD that was not amenable to without PCI and patent LIMA. Continue home aspirin, Plavix, statin -Discontinue enalapril, replacing with valsartan -Change metoprolol to 25 mg XL  HTN:  -Discontinue enalapril -metoprolol succinate 25 twice daily,  -discontinue amlodipine"   Diagnostic Tests Reviewed:  XR Chest Portable  Result Date: 12/26/2019 EXAM: XR CHEST PORTABLE DATE: 12/26/2019 9:59 AM ACCESSION: [id number] UN DICTATED: 12/26/2019 10:04 AM INTERPRETATION LOCATION: [facility] CLINICAL INDICATION: 74 years old Male with DYSPNEA COMPARISON: 03/25/2011 chest radiograph. TECHNIQUE: Portable Chest Radiograph. FINDINGS: Similar sequelae of sternotomy and CABG. Sternotomy wires are intact Bilateral streaky interstitial pulmonary opacities. Mild cephalization of the perihilar vasculature. No pleural effusion or pneumothorax. Stable cardiomediastinal silhouette.   Bilateral streaky interstitial pulmonary opacities likely representing pulmonary edema. Differential also includes infection.  Echocardiogram W Colorflow Spectral Doppler  Result Date:  12/27/2019 Patient Info Name: TEKOA AMON Age: 34 years DOB: [birth year] Gender: Male MRN: [id number] Accession #: [id number] Ht: 163 cm Wt: 88 kg BSA: 2.03 m2 Technical Quality: Poor Exam Date: 12/27/2019 9:26 AM Site Location: [facility] Exam Location: [facility] Admit Date: 12/26/2019 Exam Type: ECHOCARDIOGRAM W COLORFLOW SPECTRAL DOPPLER Study Info Indications - HFrEF exacerbation Complete two-dimensional, color flow and Doppler transthoracic echocardiogram is performed. Staff Referring Physician: Warnell Forester; Reading Fellow: Wendi Snipes Sonographer: Rupert Stacks Ordering Physician: Emeline Gins Z Account #: [id number] Reason for  Poor Study: poor echocardiographic windows Summary 1. The left ventricle is moderately dilated in size with normal wall thickness. 2. The left ventricular systolic function is moderately decreased, LVEF is visually estimated at 35%. 3. There is decreased contractile function involving the basilar inferior/posterior and lateral walls. 4. The mitral valve leaflets are mildly thickened with normal leaflet mobility. 5. There is moderate mitral valve regurgitation. 6. The aortic valve is probably trileaflet with mildly thickened leaflets with mildly reduced excursion. 7. The left atrium is moderately dilated in size. 8. There is moderate pulmonary hypertension. 9. The right ventricle is mildly dilated in size, with moderately reduced systolic function. 10. There is moderate tricuspid regurgitation. 11. The right atrium is moderately dilated in size. Left Ventricle The left ventricle is moderately dilated in size with normal wall thickness. The left ventricular systolic function is moderately decreased, LVEF is visually estimated at 35%. There is decreased contractile function involving the basilar inferior/posterior and lateral walls. Left ventricular diastolic function cannot be accurately assessed. Right Ventricle The right ventricle is mildly dilated in size, with moderately reduced systolic function. Left Atrium The left atrium is moderately dilated in size. Right Atrium The right atrium is moderately dilated in size. Aortic Valve The aortic valve is probably trileaflet with mildly thickened leaflets with mildly reduced excursion. There is no significant aortic regurgitation. There is no evidence of a significant transvalvular gradient. Pulmonic Valve The pulmonic valve is poorly visualized, but probably normal. There is no significant pulmonic regurgitation. There is no evidence of a significant transvalvular gradient. Mitral Valve The mitral valve leaflets are mildly thickened with normal leaflet mobility. There is  moderate mitral valve regurgitation. Tricuspid Valve The tricuspid valve leaflets are normal, with normal leaflet mobility. There is moderate tricuspid regurgitation. There is moderate pulmonary hypertension. Maximum TR velocity: 3.2 m/s. Other Findings Rhythm: Sinus Rhythm. Pericardium/Pleural There is no pericardial effusion. Inferior Vena Cava IVC size and inspiratory change suggest elevated right atrial pressure. (10-20 mmHg). Aorta The aorta is normal in size in the visualized segments. Pulmonic Valve ---------------------------------------------------------------------- Name Value Normal ---------------------------------------------------------------------- PV Doppler ---------------------------------------------------------------------- PV Peak Velocity 0.8 m/s Mitral Valve ---------------------------------------------------------------------- Name Value Normal ---------------------------------------------------------------------- MV Regurgitation Doppler ---------------------------------------------------------------------- MR Peak Velocity 5.1 m/s MR VTI 153 cm MR PISA Radius 0.3 cm MR PISA Alias Velocity 35 cm/s MR ERO (PISA) 0.04 cm2 MR Volume (PISA) 6 ml MV Diastolic Function ---------------------------------------------------------------------- MV E Peak Velocity 149 cm/s MV A Peak Velocity 69 cm/s MV E/A 2.2 Tricuspid Valve ---------------------------------------------------------------------- Name Value Normal ---------------------------------------------------------------------- TV Regurgitation Doppler ---------------------------------------------------------------------- TR Peak Velocity 3 m/s Estimated PAP/RSVP ---------------------------------------------------------------------- RA Pressure 15 mmHg <=5 RV Systolic Pressure 56 mmHg <36 Aorta ---------------------------------------------------------------------- Name Value Normal  ---------------------------------------------------------------------- Ascending Aorta ---------------------------------------------------------------------- Ao Root Diameter (2D) 3.0 cm Ao Root Diam Index (2D) 14.8 cm/m2 Venous ---------------------------------------------------------------------- Name Value Normal ---------------------------------------------------------------------- IVC/SVC ---------------------------------------------------------------------- IVC Diameter (Exp 2D) 2.7 cm <=2.1 Aortic Valve ---------------------------------------------------------------------- Name Value Normal ---------------------------------------------------------------------- AV Doppler ---------------------------------------------------------------------- AV Peak Velocity 2.0 m/s  Ventricles ---------------------------------------------------------------------- Name Value Normal ---------------------------------------------------------------------- LV Dimensions 2D/MM ---------------------------------------------------------------------- IVS Diastolic Thickness (2D) 0.9 cm 0.6-1.0 LVID Diastole (2D) 7.2 cm 4.2-5.8 LVPW Diastolic Thickness (2D) 0.9 cm 0.6-1.0 LVID Systole (2D) 5.7 cm 2.5-4.0 LV Function ---------------------------------------------------------------------- LV EF (4C MOD) 29 % RV Dimensions 2D/MM ---------------------------------------------------------------------- RV Basal Diastolic Dimension 4.4 cm 2.5-4.1 TAPSE 1.6 cm >=1.7 Atria ---------------------------------------------------------------------- Name Value Normal ---------------------------------------------------------------------- LA Dimensions ---------------------------------------------------------------------- LA Dimension (2D) 5.0 cm 3.0-4.1 LA Volume (BP MOD) 53 ml LA Volume Index (BP MOD) 26.33 ml/m2 16.00-34.00 RA Dimensions ---------------------------------------------------------------------- RA Area (4C) 21.1 cm2 <=18.0 RA Area (4C) Index  10.4 cm2/m2 Report Signatures Finalized by Andrey Campanile MD on 12/27/2019 02:37 PM Resident Collins Scotland MD on 12/27/2019 01:11 PM  Disposition: Home  Consults: Cardiology  Discharge Instructions: Follow up here, -Patient with reduced EF to 30-35%, would benefit from [facility], however may be cost prohibitive, discharged on losartan. -Discharged with Lasix 40 twice daily, can most likely down titrate at first follow-up visit  Disease/illness Education: Discussed today  Home Health/Community Services Discussions/Referrals: N/A  Establishment or re-establishment of referral orders for community resources: N/A  Discussion with other health care providers: None  Assessment and Support of treatment regimen adherence: Good  Appointments Coordinated with: Patient  Education for self-management, independent living, and ADLs: Discussed today  Since getting out of the hospital, Ronson states that he's doing OK. He's still feeling really SOB. He feels out of breath with most any activity, feeling tired too. No dizziness. He has not had any swelling in his legs, but he did until today. He has been having trouble sleeping as he's getting up multiple times a night to pee due to the lasix. He is seeing the heart failure clinic next Friday. He has been able to lay flat. He is otherwise doing OK.   DIABETES Hypoglycemic episodes:no Polydipsia/polyuria: yes Visual disturbance: no Chest pain: no Paresthesias: no Glucose Monitoring: no  Accucheck frequency: Not Checking Taking Insulin?: yes Blood Pressure Monitoring: not checking Retinal Examination: Up to Date Foot Exam: Up to Date Diabetic Education: Completed Pneumovax: Up to Date Influenza: Up to Date Aspirin: yes  HYPERTENSION / HYPERLIPIDEMIA Satisfied with current treatment? no Duration of hypertension: chronic BP monitoring frequency: not checking BP range: running low  BP medication side effects: no Duration of  hyperlipidemia: chronic Cholesterol medication side effects: no Past cholesterol medications: atorvastatin Medication compliance: excellent compliance Aspirin: yes Recent stressors: no Recurrent headaches: no Visual changes: no Palpitations: no Dyspnea: yes Chest pain: no Lower extremity edema: yes Dizzy/lightheaded: no   Relevant past medical, surgical, family and social history reviewed and updated as indicated. Interim medical history since our last visit reviewed. Allergies and medications reviewed and updated.  Review of Systems  Constitutional: Positive for fatigue. Negative for activity change, appetite change, chills, diaphoresis, fever and unexpected weight change.  HENT: Negative.   Cardiovascular: Negative.   Gastrointestinal: Negative.   Genitourinary: Positive for frequency. Negative for decreased urine volume, difficulty urinating, discharge, dysuria, enuresis, flank pain, genital sores, hematuria, penile pain, penile swelling, scrotal swelling, testicular pain and urgency.  Musculoskeletal: Negative.   Neurological: Negative.   Psychiatric/Behavioral: Negative.     Per HPI unless specifically indicated above     Objective:    BP (!) 92/59 (BP Location: Left Arm, Patient Position: Sitting, Cuff Size: Normal)    Pulse 84    Temp 98.2 F (36.8 C) (Oral)    Resp 16    Ht 5\' 6"  (1.676 m)  Wt 190 lb 3.2 oz (86.3 kg)    SpO2 94%    BMI 30.70 kg/m   Wt Readings from Last 3 Encounters:  01/05/20 190 lb 3.2 oz (86.3 kg)  11/01/19 197 lb (89.4 kg)  10/21/19 210 lb (95.3 kg)    Physical Exam Vitals and nursing note reviewed.  Constitutional:      General: He is not in acute distress.    Appearance: Normal appearance. He is not ill-appearing, toxic-appearing or diaphoretic.  HENT:     Head: Normocephalic and atraumatic.     Right Ear: External ear normal.     Left Ear: External ear normal.     Nose: Nose normal.     Mouth/Throat:     Mouth: Mucous membranes  are moist.     Pharynx: Oropharynx is clear.  Eyes:     General: No scleral icterus.       Right eye: No discharge.        Left eye: No discharge.     Extraocular Movements: Extraocular movements intact.     Conjunctiva/sclera: Conjunctivae normal.     Pupils: Pupils are equal, round, and reactive to light.  Cardiovascular:     Rate and Rhythm: Normal rate and regular rhythm.     Pulses: Normal pulses.     Heart sounds: Normal heart sounds. No murmur heard.  No friction rub. No gallop.   Pulmonary:     Effort: Pulmonary effort is normal. No respiratory distress.     Breath sounds: No stridor. No wheezing, rhonchi or rales.     Comments: Decreased breath sounds LLL, otherwise CTA Chest:     Chest wall: No tenderness.  Musculoskeletal:        General: Normal range of motion.     Cervical back: Normal range of motion and neck supple.  Skin:    General: Skin is warm and dry.     Capillary Refill: Capillary refill takes less than 2 seconds.     Coloration: Skin is not jaundiced or pale.     Findings: No bruising, erythema, lesion or rash.  Neurological:     General: No focal deficit present.     Mental Status: He is alert and oriented to person, place, and time. Mental status is at baseline.  Psychiatric:        Mood and Affect: Mood normal.        Behavior: Behavior normal.        Thought Content: Thought content normal.        Judgment: Judgment normal.     Results for orders placed or performed in visit on 01/05/20  Bayer DCA Hb A1c Waived  Result Value Ref Range   HB A1C (BAYER DCA - WAIVED) 6.6 <7.0 %      Assessment & Plan:   Problem List Items Addressed This Visit      Cardiovascular and Mediastinum   Hypertension associated with diabetes (HCC)    Running low. Will change lasix to 40mg  BID to 40mg  qAM and 40mg  qPM every other day. Labs drawn today. Await results. Call with any concerns.       Relevant Medications   furosemide (LASIX) 40 MG tablet   losartan  (COZAAR) 100 MG tablet   spironolactone (ALDACTONE) 25 MG tablet   Other Relevant Orders   CBC with Differential/Platelet   Comprehensive metabolic panel   Chronic congestive heart failure (HCC) - Primary    To see heart failure clinic next week. Euvolemic  today. Checking CXR and labs today. Await results. Treat as needed.       Relevant Medications   furosemide (LASIX) 40 MG tablet   losartan (COZAAR) 100 MG tablet   spironolactone (ALDACTONE) 25 MG tablet   Other Relevant Orders   Referral to Chronic Care Management Services   DG Chest 2 View     Respiratory   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 classification (HCC)    Continue inhalers. No wheezing. Continue current regimen. Continue to monitor. Call with any concerns.         Endocrine   DM (diabetes mellitus), type 2, uncontrolled, with renal complications (HCC)    Rechecking labs today. Await results. Continue current regimen. Continue to monitor. Call with any concerns.       Relevant Medications   losartan (COZAAR) 100 MG tablet   Other Relevant Orders   CBC with Differential/Platelet   Bayer DCA Hb A1c Waived (Completed)   Comprehensive metabolic panel   CKD stage 3 due to type 2 diabetes mellitus (HCC)    Rechecking labs today. Await results. Continue current regimen. Continue to monitor. Call with any concerns.       Relevant Medications   losartan (COZAAR) 100 MG tablet     Other   Hyperlipidemia    Rechecking labs today. Await results. Continue current regimen. Continue to monitor. Call with any concerns.       Relevant Medications   furosemide (LASIX) 40 MG tablet   losartan (COZAAR) 100 MG tablet   spironolactone (ALDACTONE) 25 MG tablet   Other Relevant Orders   CBC with Differential/Platelet   Comprehensive metabolic panel   Lipid Panel w/o Chol/HDL Ratio    Other Visit Diagnoses    SOB (shortness of breath)       Unclear if due to CHF or COPD, no wheezes, but no rales and euvolemic. Checking labs and  CXR. Await results.    Relevant Orders   DG Chest 2 View   Acute respiratory failure with hypoxia (HCC)       Resolved. Lungs clear except decreased breath sounds LLL. Checking CXR. Call with any concerns.        Follow up plan: Return early next week.  >29minutes spent with patient today.

## 2020-01-05 NOTE — Assessment & Plan Note (Signed)
Running low. Will change lasix to 40mg  BID to 40mg  qAM and 40mg  qPM every other day. Labs drawn today. Await results. Call with any concerns.

## 2020-01-05 NOTE — Assessment & Plan Note (Signed)
To see heart failure clinic next week. Euvolemic today. Checking CXR and labs today. Await results. Treat as needed.

## 2020-01-05 NOTE — Assessment & Plan Note (Signed)
Continue inhalers. No wheezing. Continue current regimen. Continue to monitor. Call with any concerns.

## 2020-01-06 ENCOUNTER — Other Ambulatory Visit: Payer: Self-pay

## 2020-01-06 LAB — CBC WITH DIFFERENTIAL/PLATELET
Basophils Absolute: 0.1 10*3/uL (ref 0.0–0.2)
Basos: 1 %
EOS (ABSOLUTE): 0.1 10*3/uL (ref 0.0–0.4)
Eos: 1 %
Hematocrit: 39.6 % (ref 37.5–51.0)
Hemoglobin: 13.4 g/dL (ref 13.0–17.7)
Immature Grans (Abs): 0 10*3/uL (ref 0.0–0.1)
Immature Granulocytes: 0 %
Lymphocytes Absolute: 1.7 10*3/uL (ref 0.7–3.1)
Lymphs: 16 %
MCH: 28.6 pg (ref 26.6–33.0)
MCHC: 33.8 g/dL (ref 31.5–35.7)
MCV: 84 fL (ref 79–97)
Monocytes Absolute: 0.8 10*3/uL (ref 0.1–0.9)
Monocytes: 8 %
Neutrophils Absolute: 8.2 10*3/uL — ABNORMAL HIGH (ref 1.4–7.0)
Neutrophils: 74 %
Platelets: 334 10*3/uL (ref 150–450)
RBC: 4.69 x10E6/uL (ref 4.14–5.80)
RDW: 12.9 % (ref 11.6–15.4)
WBC: 10.9 10*3/uL — ABNORMAL HIGH (ref 3.4–10.8)

## 2020-01-06 LAB — COMPREHENSIVE METABOLIC PANEL
ALT: 18 IU/L (ref 0–44)
AST: 21 IU/L (ref 0–40)
Albumin/Globulin Ratio: 1.6 (ref 1.2–2.2)
Albumin: 4.2 g/dL (ref 3.7–4.7)
Alkaline Phosphatase: 96 IU/L (ref 44–121)
BUN/Creatinine Ratio: 17 (ref 10–24)
BUN: 34 mg/dL — ABNORMAL HIGH (ref 8–27)
Bilirubin Total: 0.3 mg/dL (ref 0.0–1.2)
CO2: 27 mmol/L (ref 20–29)
Calcium: 9.7 mg/dL (ref 8.6–10.2)
Chloride: 98 mmol/L (ref 96–106)
Creatinine, Ser: 2 mg/dL — ABNORMAL HIGH (ref 0.76–1.27)
GFR calc Af Amer: 36 mL/min/{1.73_m2} — ABNORMAL LOW (ref >59)
GFR calc non Af Amer: 31 mL/min/{1.73_m2} — ABNORMAL LOW (ref >59)
Globulin, Total: 2.7 g/dL (ref 1.5–4.5)
Glucose: 96 mg/dL (ref 65–99)
Potassium: 4 mmol/L (ref 3.5–5.2)
Sodium: 141 mmol/L (ref 134–144)
Total Protein: 6.9 g/dL (ref 6.0–8.5)

## 2020-01-06 LAB — LIPID PANEL W/O CHOL/HDL RATIO
Cholesterol, Total: 115 mg/dL (ref 100–199)
HDL: 34 mg/dL — ABNORMAL LOW (ref >39)
LDL Chol Calc (NIH): 63 mg/dL (ref 0–99)
Triglycerides: 92 mg/dL (ref 0–149)
VLDL Cholesterol Cal: 18 mg/dL (ref 5–40)

## 2020-01-06 MED ORDER — TRELEGY ELLIPTA 100-62.5-25 MCG/INH IN AEPB: 1.0000 | INHALATION_SPRAY | Freq: Every day | RESPIRATORY_TRACT | 11 refills | Status: DC

## 2020-01-06 MED ORDER — ONETOUCH DELICA LANCETS 30G MISC: 1.0000 | Freq: Every day | 12 refills | Status: AC

## 2020-01-10 ENCOUNTER — Encounter: Payer: Self-pay | Admitting: Family Medicine

## 2020-01-10 ENCOUNTER — Other Ambulatory Visit: Payer: Self-pay

## 2020-01-10 ENCOUNTER — Ambulatory Visit (INDEPENDENT_AMBULATORY_CARE_PROVIDER_SITE_OTHER): Payer: Medicare HMO | Admitting: Family Medicine

## 2020-01-10 VITALS — BP 106/69 | HR 87 | Temp 98.7°F | Ht 66.0 in | Wt 190.0 lb

## 2020-01-10 DIAGNOSIS — I509 Heart failure, unspecified: Secondary | ICD-10-CM

## 2020-01-10 DIAGNOSIS — I129 Hypertensive chronic kidney disease with stage 1 through stage 4 chronic kidney disease, or unspecified chronic kidney disease: Secondary | ICD-10-CM

## 2020-01-10 NOTE — Progress Notes (Signed)
BP 106/69   Pulse 87   Temp 98.7 F (37.1 C) (Oral)   Ht 5\' 6"  (1.676 m)   Wt 190 lb (86.2 kg)   SpO2 95%   BMI 30.67 kg/m    Subjective:    Patient ID: Benjamin Chan, male    DOB: 20-Nov-1945, 74 y.o.   MRN: [id number]  HPI: Benjamin Chan is a 74 y.o. male  Chief Complaint  Patient presents with  . Congestive Heart Failure    Follow up from 1 week ago. Low Bp 1 week ago- follow up.   Feeling much better. He is no longer dizzy. He has not been as tired. No SOB. No swelling. He is tolerating his lasix well. He is due to see cardiology tomorrow. He is otherwise feeling well with no other concerns or complaints at this time.   Relevant past medical, surgical, family and social history reviewed and updated as indicated. Interim medical history since our last visit reviewed. Allergies and medications reviewed and updated.  Review of Systems  Constitutional: Negative.   HENT: Negative.   Respiratory: Negative.   Cardiovascular: Negative.   Musculoskeletal: Negative.   Psychiatric/Behavioral: Negative.     Per HPI unless specifically indicated above     Objective:    BP 106/69   Pulse 87   Temp 98.7 F (37.1 C) (Oral)   Ht 5\' 6"  (1.676 m)   Wt 190 lb (86.2 kg)   SpO2 95%   BMI 30.67 kg/m   Wt Readings from Last 3 Encounters:  01/10/20 190 lb (86.2 kg)  01/05/20 190 lb 3.2 oz (86.3 kg)  11/01/19 197 lb (89.4 kg)    Physical Exam Vitals and nursing note reviewed.  Constitutional:      General: He is not in acute distress.    Appearance: Normal appearance. He is not ill-appearing, toxic-appearing or diaphoretic.  HENT:     Head: Normocephalic and atraumatic.     Right Ear: External ear normal.     Left Ear: External ear normal.     Nose: Nose normal.     Mouth/Throat:     Mouth: Mucous membranes are moist.     Pharynx: Oropharynx is clear.  Eyes:     General: No scleral icterus.       Right eye: No discharge.        Left eye: No discharge.      Extraocular Movements: Extraocular movements intact.     Conjunctiva/sclera: Conjunctivae normal.     Pupils: Pupils are equal, round, and reactive to light.  Cardiovascular:     Rate and Rhythm: Normal rate and regular rhythm.     Pulses: Normal pulses.     Heart sounds: Normal heart sounds. No murmur heard.  No friction rub. No gallop.   Pulmonary:     Effort: Pulmonary effort is normal. No respiratory distress.     Breath sounds: Normal breath sounds. No stridor. No wheezing, rhonchi or rales.  Chest:     Chest wall: No tenderness.  Musculoskeletal:        General: Normal range of motion.     Cervical back: Normal range of motion and neck supple.     Right lower leg: No edema.     Left lower leg: No edema.  Skin:    General: Skin is warm and dry.     Capillary Refill: Capillary refill takes less than 2 seconds.     Coloration: Skin is not jaundiced or  pale.     Findings: No bruising, erythema, lesion or rash.  Neurological:     General: No focal deficit present.     Mental Status: He is alert and oriented to person, place, and time. Mental status is at baseline.  Psychiatric:        Mood and Affect: Mood normal.        Behavior: Behavior normal.        Thought Content: Thought content normal.        Judgment: Judgment normal.     Results for orders placed or performed in visit on 01/05/20  CBC with Differential/Platelet  Result Value Ref Range   WBC 10.9 (H) 3.4 - 10.8 x10E3/uL   RBC 4.69 4.14 - 5.80 x10E6/uL   Hemoglobin 13.4 13.0 - 17.7 g/dL   Hematocrit 93.7 90.2 - 51.0 %   MCV 84 79 - 97 fL   MCH 28.6 26.6 - 33.0 pg   MCHC 33.8 31 - 35 g/dL   RDW 40.9 73.5 - 32.9 %   Platelets 334 150 - 450 x10E3/uL   Neutrophils 74 Not Estab. %   Lymphs 16 Not Estab. %   Monocytes 8 Not Estab. %   Eos 1 Not Estab. %   Basos 1 Not Estab. %   Neutrophils Absolute 8.2 (H) 1.40 - 7.00 x10E3/uL   Lymphocytes Absolute 1.7 0 - 3 x10E3/uL   Monocytes Absolute 0.8 0 - 0 x10E3/uL    EOS (ABSOLUTE) 0.1 0.0 - 0.4 x10E3/uL   Basophils Absolute 0.1 0 - 0 x10E3/uL   Immature Granulocytes 0 Not Estab. %   Immature Grans (Abs) 0.0 0.0 - 0.1 x10E3/uL  Bayer DCA Hb A1c Waived  Result Value Ref Range   HB A1C (BAYER DCA - WAIVED) 6.6 <7.0 %  Comprehensive metabolic panel  Result Value Ref Range   Glucose 96 65 - 99 mg/dL   BUN 34 (H) 8 - 27 mg/dL   Creatinine, Ser 9.24 (H) 0.76 - 1.27 mg/dL   GFR calc non Af Amer 31 (L) >59 mL/min/1.73   GFR calc Af Amer 36 (L) >59 mL/min/1.73   BUN/Creatinine Ratio 17 10 - 24   Sodium 141 134 - 144 mmol/L   Potassium 4.0 3.5 - 5.2 mmol/L   Chloride 98 96 - 106 mmol/L   CO2 27 20 - 29 mmol/L   Calcium 9.7 8.6 - 10.2 mg/dL   Total Protein 6.9 6.0 - 8.5 g/dL   Albumin 4.2 3.7 - 4.7 g/dL   Globulin, Total 2.7 1.5 - 4.5 g/dL   Albumin/Globulin Ratio 1.6 1.2 - 2.2   Bilirubin Total 0.3 0.0 - 1.2 mg/dL   Alkaline Phosphatase 96 44 - 121 IU/L   AST 21 0 - 40 IU/L   ALT 18 0 - 44 IU/L  Lipid Panel w/o Chol/HDL Ratio  Result Value Ref Range   Cholesterol, Total 115 100 - 199 mg/dL   Triglycerides 92 0 - 149 mg/dL   HDL 34 (L) >26 mg/dL   VLDL Cholesterol Cal 18 5 - 40 mg/dL   LDL Chol Calc (NIH) 63 0 - 99 mg/dL      Assessment & Plan:   Problem List Items Addressed This Visit      Cardiovascular and Mediastinum   Chronic congestive heart failure (HCC)    Euvolemic again today. Feeling much better. Continue 40mg  lasix daily with additional pill every other day until he sees cardiology. Rechecking BMP today. Call with any concerns.  Relevant Medications   metoprolol succinate (TOPROL-XL) 25 MG 24 hr tablet     Genitourinary   Benign hypertensive renal disease - Primary   Relevant Orders   Basic metabolic panel       Follow up plan: Return in about 3 months (around 04/11/2020).  >15 minutes spent with patient today

## 2020-01-10 NOTE — Assessment & Plan Note (Signed)
Euvolemic again today. Feeling much better. Continue 40mg  lasix daily with additional pill every other day until he sees cardiology. Rechecking BMP today. Call with any concerns.

## 2020-01-11 DIAGNOSIS — Z794 Long term (current) use of insulin: Secondary | ICD-10-CM | POA: Diagnosis not present

## 2020-01-11 DIAGNOSIS — E1122 Type 2 diabetes mellitus with diabetic chronic kidney disease: Secondary | ICD-10-CM | POA: Diagnosis not present

## 2020-01-11 DIAGNOSIS — N1831 Chronic kidney disease, stage 3a: Secondary | ICD-10-CM | POA: Diagnosis not present

## 2020-01-11 DIAGNOSIS — I10 Essential (primary) hypertension: Secondary | ICD-10-CM | POA: Diagnosis not present

## 2020-01-11 DIAGNOSIS — J410 Simple chronic bronchitis: Secondary | ICD-10-CM | POA: Diagnosis not present

## 2020-01-11 DIAGNOSIS — I5020 Unspecified systolic (congestive) heart failure: Secondary | ICD-10-CM | POA: Diagnosis not present

## 2020-01-11 DIAGNOSIS — I2510 Atherosclerotic heart disease of native coronary artery without angina pectoris: Secondary | ICD-10-CM | POA: Diagnosis not present

## 2020-01-11 LAB — BASIC METABOLIC PANEL
BUN/Creatinine Ratio: 16 (ref 10–24)
BUN: 26 mg/dL (ref 8–27)
CO2: 24 mmol/L (ref 20–29)
Calcium: 8.9 mg/dL (ref 8.6–10.2)
Chloride: 100 mmol/L (ref 96–106)
Creatinine, Ser: 1.63 mg/dL — ABNORMAL HIGH (ref 0.76–1.27)
GFR calc Af Amer: 47 mL/min/{1.73_m2} — ABNORMAL LOW (ref >59)
GFR calc non Af Amer: 40 mL/min/{1.73_m2} — ABNORMAL LOW (ref >59)
Glucose: 218 mg/dL — ABNORMAL HIGH (ref 65–99)
Potassium: 4.3 mmol/L (ref 3.5–5.2)
Sodium: 138 mmol/L (ref 134–144)

## 2020-01-13 NOTE — Progress Notes (Signed)
Chronic Care Management Pharmacy Assistant   Name: Benjamin Chan  MRN: [id number] DOB: 1945/04/29  Reason for Encounter: Patient Assistance Follow Up Call (Attempted)   PCP : [name], PA-C  Allergies:   Allergies  Allergen Reactions  . Brilinta [Ticagrelor] Shortness Of Breath    D/c by cardiology    Medications: Outpatient Encounter Medications as of 01/04/2020  Medication Sig Note  . acetaminophen (TYLENOL) 325 MG tablet Take 650 mg by mouth every 6 (six) hours as needed.   [name] aspirin EC 81 MG tablet Take 1 tablet (81 mg total) by mouth daily.   [name] atorvastatin (LIPITOR) 80 MG tablet Take 1 tablet (80 mg total) by mouth daily.   . clopidogrel (PLAVIX) 75 MG tablet Take 1 tablet (75 mg total) by mouth daily with breakfast.   . Dulaglutide (TRULICITY) 0.75 MG/0.5ML SOPN Inject 0.75 mg into the skin once a week.   . ezetimibe (ZETIA) 10 MG tablet Take 1 tablet (10 mg total) by mouth daily.   . finasteride (PROSCAR) 5 MG tablet Take 1 tablet (5 mg total) by mouth daily.   . fluticasone (FLONASE) 50 MCG/ACT nasal spray Place 1 spray into both nostrils 2 (two) times daily. Pt takes twice a day 05/25/2019: Using PRN  . glipiZIDE (GLUCOTROL) 5 MG tablet TAKE ONE TABLET BY MOUTH EVERY MORNING BEFORE BREAKFAST   . insulin degludec (TRESIBA FLEXTOUCH) 100 UNIT/ML FlexTouch Pen Inject 0.45 mLs (45 Units total) into the skin daily. 11/24/2019: Patient receives Basaglar through PAP  . Insulin Glargine (BASAGLAR KWIKPEN) 100 UNIT/ML Inject 40 Units into the skin in the morning.   [name] JARDIANCE 25 MG TABS tablet TAKE ONE TABLET BY MOUTH ONCE DAILY   . metFORMIN (GLUCOPHAGE-XR) 500 MG 24 hr tablet Take 2 tablets (1,000 mg total) by mouth 2 (two) times daily.   . metoprolol tartrate (LOPRESSOR) 25 MG tablet Take 1 tablet (25 mg total) by mouth 2 (two) times daily.   . Multiple Vitamin (MULTIVITAMIN) TABS Take 1 tablet by mouth daily. Gnp century mature multivitamin tab 125c   .  nitroGLYCERIN (NITROSTAT) 0.4 MG SL tablet Place 1 tablet (0.4 mg total) under the tongue every 5 (five) minutes as needed for chest pain.   . [DISCONTINUED] albuterol (PROVENTIL) (2.5 MG/3ML) 0.083% nebulizer solution Take 3 mLs (2.5 mg total) by nebulization every 6 (six) hours as needed for wheezing or shortness of breath. (Patient not taking: Reported on 01/05/2020)   . [DISCONTINUED] albuterol (VENTOLIN HFA) 108 (90 Base) MCG/ACT inhaler Inhale 2 puffs into the lungs every 6 (six) hours as needed for wheezing or shortness of breath. (Patient not taking: Reported on 01/05/2020)   . [DISCONTINUED] amLODipine (NORVASC) 10 MG tablet Take 0.5 tablets (5 mg total) by mouth daily. (Patient not taking: Reported on 01/05/2020)   . [DISCONTINUED] enalapril (VASOTEC) 20 MG tablet Take 1 tablet (20 mg total) by mouth 2 (two) times daily. (Patient not taking: Reported on 01/05/2020)   . [DISCONTINUED] Fluticasone-Umeclidin-Vilant (TRELEGY ELLIPTA) 100-62.5-25 MCG/INH AEPB Inhale 1 puff into the lungs daily.    No facility-administered encounter medications on file as of 01/04/2020.    Current Diagnosis: Patient Active Problem List   Diagnosis Date Noted  . Chronic congestive heart failure (HCC) 01/05/2020  . Diabetic ketoacidosis without coma associated with type 2 diabetes mellitus (HCC)   . NSTEMI (non-ST elevated myocardial infarction) (HCC) 07/14/2019  . Special screening for malignant neoplasms, colon   . CKD stage 3 due to type  2 diabetes mellitus (HCC) 11/23/2017  . Hyperlipidemia 07/10/2017  . Venous insufficiency of both lower extremities 06/10/2017  . Non-ST elevation myocardial infarction (NSTEMI), subendocardial infarction, subsequent episode of care (HCC) 04/14/2017  . 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 BPH (benign prostatic hyperplasia) 04/13/2017  . Benign hypertensive  renal disease 04/13/2017  . DM (diabetes mellitus), type 2, uncontrolled, with renal complications (HCC) 04/13/2017  . History of myocardial infarction 04/13/2017  . Diabetic neuropathy (HCC) 06/12/2016  . Hypertension associated with diabetes (HCC) 08/26/2012  . CAD (coronary artery disease) 08/26/2012  . ASD (atrial septal defect), ostium secundum 10/24/1997    Goals Addressed   None    Attempted to contact patient to follow up on recent patient assistance application. No answer. Left HIPAA compliant voicemail requesting call back.   [name], CMA Clinical Pharmicist Assistant [phone]   Follow-Up:  CPA to continue to follow up.

## 2020-02-01 ENCOUNTER — Ambulatory Visit: Payer: Medicare HMO | Admitting: Family Medicine

## 2020-02-06 ENCOUNTER — Telehealth: Payer: Self-pay | Admitting: Family Medicine

## 2020-02-06 ENCOUNTER — Other Ambulatory Visit: Payer: Self-pay | Admitting: Family Medicine

## 2020-02-06 NOTE — Telephone Encounter (Signed)
[name] pharm with [facility] is calling and she said she has sent a refill request for plavix 75 mg  Back in oct 2021. Pt last seen rachel 11-01-2019

## 2020-02-06 NOTE — Telephone Encounter (Signed)
Requested medication (s) are due for refill today: yes  Requested medication (s) are on the active medication list: yes  Last refill:  09/22/19  #90  0 refills  Future visit scheduled: No  Notes to clinic: Last ordered by Benjamin Chan    Requested Prescriptions  Pending Prescriptions Disp Refills   clopidogrel (PLAVIX) 75 MG tablet [Pharmacy Med Name: clopidogrel 75 mg tablet] 90 tablet 0    Sig: TAKE ONE TABLET BY MOUTH EVERY MORNING WITH BREAKFAST      Hematology: Antiplatelets - clopidogrel Failed - 02/06/2020  2:42 PM      Failed - Evaluate AST, ALT within 2 months of therapy initiation.      Passed - ALT in normal range and within 360 days    ALT  Date Value Ref Range Status  01/05/2020 18 0 - 44 IU/L Final          Passed - AST in normal range and within 360 days    AST  Date Value Ref Range Status  01/05/2020 21 0 - 40 IU/L Final          Passed - HCT in normal range and within 180 days    Hematocrit  Date Value Ref Range Status  01/05/2020 39.6 37.5 - 51.0 % Final          Passed - HGB in normal range and within 180 days    Hemoglobin  Date Value Ref Range Status  01/05/2020 13.4 13.0 - 17.7 g/dL Final          Passed - PLT in normal range and within 180 days    Platelets  Date Value Ref Range Status  01/05/2020 334 150 - 450 x10E3/uL Final          Passed - Valid encounter within last 6 months    Recent Outpatient Visits           3 weeks ago Benign hypertensive renal disease   [name] Sheldon, Megan P, DO   1 month ago Chronic congestive heart failure, unspecified heart failure type (HCC)   [name] Johnson, Megan P, DO   3 months ago DM (diabetes mellitus), type 2, uncontrolled, with renal complications [facility])   [name] LeChee, Sobieski, PA-C   6 months ago DM (diabetes mellitus), type 2, uncontrolled, with renal complications [facility])   [facility] [name], [place]    7 months ago 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 classification [facility])   [facility] [name], [place]       Future Appointments             In 8 months [facility]

## 2020-02-06 NOTE — Telephone Encounter (Signed)
Will route note to the office, as Clopidogrel is pending PCP approval, sent today through Interface, Surescripts.

## 2020-02-06 NOTE — Telephone Encounter (Signed)
RX already sent in.  

## 2020-02-15 ENCOUNTER — Telehealth: Payer: Self-pay | Admitting: Pharmacist

## 2020-02-15 NOTE — Chronic Care Management (AMB) (Signed)
Chronic Care Management Pharmacy Assistant   Name: Benjamin Chan  MRN: [id number] DOB: 05/13/1945  Reason for Encounter: Patient Assistance Required Documentation Follow-up   PCP : [name], PA-C  Allergies:   Allergies  Allergen Reactions  . Brilinta [Ticagrelor] Shortness Of Breath    D/c by cardiology    Medications: Outpatient Encounter Medications as of 02/15/2020  Medication Sig Note  . acetaminophen (TYLENOL) 325 MG tablet Take 650 mg by mouth every 6 (six) hours as needed.   [name] aspirin EC 81 MG tablet Take 1 tablet (81 mg total) by mouth daily.   [name] atorvastatin (LIPITOR) 80 MG tablet Take 1 tablet (80 mg total) by mouth daily.   . clopidogrel (PLAVIX) 75 MG tablet TAKE ONE TABLET BY MOUTH EVERY MORNING WITH BREAKFAST   . Dulaglutide (TRULICITY) 0.75 MG/0.5ML SOPN Inject 0.75 mg into the skin once a week.   . ezetimibe (ZETIA) 10 MG tablet Take 1 tablet (10 mg total) by mouth daily.   . finasteride (PROSCAR) 5 MG tablet Take 1 tablet (5 mg total) by mouth daily.   . fluticasone (FLONASE) 50 MCG/ACT nasal spray Place 1 spray into both nostrils 2 (two) times daily. Pt takes twice a day 05/25/2019: Using PRN  . Fluticasone-Umeclidin-Vilant (TRELEGY ELLIPTA) 100-62.5-25 MCG/INH AEPB Inhale 1 puff into the lungs daily.   . furosemide (LASIX) 40 MG tablet Take by mouth.   [name] glipiZIDE (GLUCOTROL) 5 MG tablet TAKE ONE TABLET BY MOUTH EVERY MORNING BEFORE BREAKFAST   . insulin degludec (TRESIBA FLEXTOUCH) 100 UNIT/ML FlexTouch Pen Inject 0.45 mLs (45 Units total) into the skin daily. 11/24/2019: Patient receives Basaglar through PAP  . Insulin Glargine (BASAGLAR KWIKPEN) 100 UNIT/ML Inject 40 Units into the skin in the morning.   [name] JARDIANCE 25 MG TABS tablet TAKE ONE TABLET BY MOUTH ONCE DAILY   . losartan (COZAAR) 100 MG tablet Take by mouth.   . metFORMIN (GLUCOPHAGE-XR) 500 MG 24 hr tablet Take 2 tablets (1,000 mg total) by mouth 2 (two) times daily.   .  metoprolol succinate (TOPROL-XL) 25 MG 24 hr tablet    . metoprolol tartrate (LOPRESSOR) 25 MG tablet Take 1 tablet (25 mg total) by mouth 2 (two) times daily.   . Multiple Vitamin (MULTIVITAMIN) TABS Take 1 tablet by mouth daily. Gnp century mature multivitamin tab 125c   . nitroGLYCERIN (NITROSTAT) 0.4 MG SL tablet Place 1 tablet (0.4 mg total) under the tongue every 5 (five) minutes as needed for chest pain.   Letta Pate Delica Lancets 30G MISC 1 each by Does not apply route daily.   [name] spironolactone (ALDACTONE) 25 MG tablet Take by mouth. (Patient not taking: Reported on 01/10/2020)    No facility-administered encounter medications on file as of 02/15/2020.    Current Diagnosis: Patient Active Problem List   Diagnosis Date Noted  . Chronic congestive heart failure (HCC) 01/05/2020  . Diabetic ketoacidosis without coma associated with type 2 diabetes mellitus (HCC)   . NSTEMI (non-ST elevated myocardial infarction) (HCC) 07/14/2019  . Special screening for malignant neoplasms, colon   . CKD stage 3 due to type 2 diabetes mellitus (HCC) 11/23/2017  . Hyperlipidemia 07/10/2017  . Venous insufficiency of both lower extremities 06/10/2017  . Non-ST elevation myocardial infarction (NSTEMI), subendocardial infarction, subsequent episode of care (HCC) 04/14/2017  . 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 BPH (benign  prostatic hyperplasia) 04/13/2017  . Benign hypertensive renal disease 04/13/2017  . DM (diabetes mellitus), type 2, uncontrolled, with renal complications (HCC) 04/13/2017  . History of myocardial infarction 04/13/2017  . Diabetic neuropathy (HCC) 06/12/2016  . Hypertension associated with diabetes (HCC) 08/26/2012  . CAD (coronary artery disease) 08/26/2012  . ASD (atrial septal defect), ostium secundum 10/24/1997    Goals Addressed   None    02/15/2020-Related to  [facility] Patient Assistance: CPA called and spoke to the patient's wife in regards of proof of income needed to complete and process application for his [facility] doctor. Per patient's wife; contacted [facility] requesting a document of how much the patient has spent on his medications for the year but the pharmacy said they will get back to her once it is available for pick up however she has not heard back from pharmacy as of yet. CPA advised her to outreach the pharmacy as soon as possible in order to fax over application for processing. The patient verbalized understanding.  02/17/2020- CPA called and spoke to the patient's wife regarding additional documentation needed to complete patient assitstance program application. The patient explained she has not been contacted by [facility] pharmacy to pick up a print out of out of pocket co-pays for medications. CPA asked if the patient would mind if I attempted to call [facility]. The patient's wife kindly said "yes, that will be ok."   CPA called and spoke with [name] at [facility] and was able to have her fax a print out of out of pocket co-pays for patient's medications for the year. [name] was able to successfully fax a total of 6 pages to [facility] [phone] attention to Benjamin Chan, [facility]. CPA called the patient's wife and advised her of the process. Patient and patient's wife verbalized understanding.  [name], CMA Clinical Pharmicist Assistant [phone]   Follow-Up:  CPA to continue follow-up with Mr.and Mrs. Gunnels.

## 2020-02-21 ENCOUNTER — Telehealth: Payer: Self-pay | Admitting: Family Medicine

## 2020-02-21 MED ORDER — "PEN NEEDLES 5/16"" 30G X 8 MM MISC": 1.0000 | Freq: Every day | 12 refills | Status: AC

## 2020-02-21 NOTE — Telephone Encounter (Signed)
Pharmacy requesting script for pt Pen/needles 31 gauge 3/16th. Pt has not had on file as script and paying out of pocket. Pls send a script   [facility] - [place], [place] - [address] [place] Phone:  [phone]  Fax:  [phone]

## 2020-02-21 NOTE — Telephone Encounter (Signed)
Routing to [facility] for script for Pen needles

## 2020-02-21 NOTE — Telephone Encounter (Signed)
Routing to provider  

## 2020-02-21 NOTE — Addendum Note (Signed)
Addended by: Dorcas Carrow on: 02/21/2020 12:51 PM   Modules accepted: Orders

## 2020-02-23 ENCOUNTER — Telehealth: Payer: Self-pay | Admitting: Pharmacist

## 2020-02-23 NOTE — Chronic Care Management (AMB) (Addendum)
Chronic Care Management Pharmacy Assistant   Name: Benjamin Chan  MRN: [id number] DOB: Jul 22, 1945  Reason for Encounter: Medication Review/Initial Questions for Pharmacist visit on 02/24/20.  Patient Questions:  1.  Have you seen any other providers since your last visit? No.-per wife. Per office visit 01/11/20 with Oletta Darter at [facility].   2.  Any changes in your medicines or health? No-per wife. Per office visit 01/11/20 with Montez Hageman, AGNP- Lasix was reduced to every other day by PCP, now with ankle edema. Increase to 40mg  daily. Lasix will be kept out of pill packs so we can titrate dose.      PCP : , PA-C  Allergies:   Allergies  Allergen Reactions   Brilinta [Ticagrelor] Shortness Of Breath    D/c by cardiology    Medications: Outpatient Encounter Medications as of 02/23/2020  Medication Sig Note   acetaminophen (TYLENOL) 325 MG tablet Take 650 mg by mouth every 6 (six) hours as needed.    aspirin EC 81 MG tablet Take 1 tablet (81 mg total) by mouth daily.    atorvastatin (LIPITOR) 80 MG tablet Take 1 tablet (80 mg total) by mouth daily.    clopidogrel (PLAVIX) 75 MG tablet TAKE ONE TABLET BY MOUTH EVERY MORNING WITH BREAKFAST    Dulaglutide (TRULICITY) 0.75 MG/0.5ML SOPN Inject 0.75 mg into the skin once a week.    ezetimibe (ZETIA) 10 MG tablet Take 1 tablet (10 mg total) by mouth daily.    finasteride (PROSCAR) 5 MG tablet Take 1 tablet (5 mg total) by mouth daily.    fluticasone (FLONASE) 50 MCG/ACT nasal spray Place 1 spray into both nostrils 2 (two) times daily. Pt takes twice a day 05/25/2019: Using PRN   Fluticasone-Umeclidin-Vilant (TRELEGY ELLIPTA) 100-62.5-25 MCG/INH AEPB Inhale 1 puff into the lungs daily.    furosemide (LASIX) 40 MG tablet Take by mouth.    glipiZIDE (GLUCOTROL) 5 MG tablet TAKE ONE TABLET BY MOUTH EVERY MORNING BEFORE BREAKFAST    insulin degludec (TRESIBA FLEXTOUCH) 100 UNIT/ML  FlexTouch Pen Inject 0.45 mLs (45 Units total) into the skin daily. 11/24/2019: Patient receives Basaglar through PAP   Insulin Glargine (BASAGLAR KWIKPEN) 100 UNIT/ML Inject 40 Units into the skin in the morning.    Insulin Pen Needle (PEN NEEDLES 5/16") 30G X 8 MM MISC 1 each by Does not apply route daily.    JARDIANCE 25 MG TABS tablet TAKE ONE TABLET BY MOUTH ONCE DAILY    losartan (COZAAR) 100 MG tablet Take by mouth.    metFORMIN (GLUCOPHAGE-XR) 500 MG 24 hr tablet Take 2 tablets (1,000 mg total) by mouth 2 (two) times daily.    metoprolol succinate (TOPROL-XL) 25 MG 24 hr tablet     metoprolol tartrate (LOPRESSOR) 25 MG tablet Take 1 tablet (25 mg total) by mouth 2 (two) times daily.    Multiple Vitamin (MULTIVITAMIN) TABS Take 1 tablet by mouth daily. Gnp century mature multivitamin tab 125c    nitroGLYCERIN (NITROSTAT) 0.4 MG SL tablet Place 1 tablet (0.4 mg total) under the tongue every 5 (five) minutes as needed for chest pain.    OneTouch Delica Lancets 30G MISC 1 each by Does not apply route daily.    spironolactone (ALDACTONE) 25 MG tablet Take by mouth. (Patient not taking: Reported on 01/10/2020)    No facility-administered encounter medications on file as of 02/23/2020.    Current Diagnosis: Patient Active Problem List   Diagnosis  Date Noted   Chronic congestive heart failure (HCC) 01/05/2020   Diabetic ketoacidosis without coma associated with type 2 diabetes mellitus (HCC)    NSTEMI (non-ST elevated myocardial infarction) (HCC) 07/14/2019   Special screening for malignant neoplasms, colon    CKD stage 3 due to type 2 diabetes mellitus (HCC) 11/23/2017   Hyperlipidemia 07/10/2017   Venous insufficiency of both lower extremities 06/10/2017   Non-ST elevation myocardial infarction (NSTEMI), subendocardial infarction, subsequent episode of care [facility]) 04/14/2017   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 No-per wife. Per office visit 01/11/20 with Oletta Darter at [facility].  Any changes in your medications or health? No-per wife. Per office visit 01/11/20 with Montez Hageman, AGNP- Increase Lasix to 40mg  daily.   Any side effects from any medications? No-per wife.  Do you have an symptoms or problems not managed by your medications? No-per wife.  Any concerns about your health right now? No-per wife.  Has your provider asked that you check blood pressure, blood sugar, or follow special diet at home? Per wife; Low sugar diet and Low salt. Patient's wife states he checks his blood pressure and blood sugar daily. Patient's wife states he is running low on blood glucose strips.   Do you get any type of exercise on a regular basis? Patient mows the yard when needed.  Can you think of a goal you would like to reach for your health? No-per wife. Patient's wife states he would like to get better.   Do you have any problems getting your medications? Patient's wife verbalized he has no issues and pharmacy always gives a courtesy call to let him know prescriptions are ready.  Is there anything that you would like to discuss during the appointment? No-per wife.  Please bring medications and supplements to appointment.  , CMA Clinical Pharmicist Assistant [phone]  Follow-Up:  Pharmacist Review  I have reviewed the care management and care  coordination activities outlined in this encounter and I am certifying that I agree with the content of this note.  [phone]. Mercer Pod PharmD, BCPS Clinical Pharmacist [facility] [phone]

## 2020-02-24 ENCOUNTER — Telehealth: Payer: Self-pay | Admitting: Pharmacist

## 2020-02-24 ENCOUNTER — Telehealth: Payer: Self-pay

## 2020-02-24 NOTE — Progress Notes (Signed)
02-24-2020: Attempted to contact the patient for a disease management follow up call. The patient did not answer phone. Left HIPAA compliant voicemail requesting a call back. Will continue to attempt to contact patient two more additional times. [name], [facility] made aware and verbalized an understanding of information.   [name], LPN Clinical Pharmacist Assistant  [phone]

## 2020-03-02 ENCOUNTER — Other Ambulatory Visit: Payer: Self-pay | Admitting: Family Medicine

## 2020-03-06 ENCOUNTER — Telehealth: Payer: Self-pay | Admitting: Pharmacist

## 2020-03-06 NOTE — Chronic Care Management (AMB) (Signed)
03/06/20- CPA called and spoke to the patient and patient's wife regarding the patient assistance applications that are ready to be signed for Basaglar, Trulicity, and to provide proof of income documentation. The patient verbalized "that will be no problem". Per patient's wife; "patient receives a Nurse, children's on what he receives throughout the year". CPA communicated with the patient that should be fine and should help his application process. CPA also advised the patient's wife to come into PCP's office tomorrow 03/07/20 to have this completed. The patient verbalized " we will be there tomorrow". CPA verbalized thank you to both patient and patient's wife.   [name], CMA Clinical Pharmicist Assistant [phone]

## 2020-04-03 ENCOUNTER — Other Ambulatory Visit: Payer: Self-pay | Admitting: Family Medicine

## 2020-04-03 MED ORDER — METFORMIN HCL ER 500 MG PO TB24: 1000.0000 mg | ORAL_TABLET | Freq: Two times a day (BID) | ORAL | 1 refills | Status: DC

## 2020-04-03 MED ORDER — CLOPIDOGREL BISULFATE 75 MG PO TABS: ORAL_TABLET | ORAL | 1 refills | Status: DC

## 2020-04-03 MED ORDER — EMPAGLIFLOZIN 25 MG PO TABS: 25.0000 mg | ORAL_TABLET | Freq: Every day | ORAL | 1 refills | Status: DC

## 2020-04-03 MED ORDER — EZETIMIBE 10 MG PO TABS: 10.0000 mg | ORAL_TABLET | Freq: Every day | ORAL | 2 refills | Status: DC

## 2020-04-03 MED ORDER — GLIPIZIDE 5 MG PO TABS: 5.0000 mg | ORAL_TABLET | Freq: Every day | ORAL | 1 refills | Status: DC

## 2020-04-03 NOTE — Telephone Encounter (Signed)
Medication:  JARDIANCE 25 MG TABS tablet [[id number]]  metFORMIN (GLUCOPHAGE-XR) 500 MG 24 hr tablet [[id number]]  ezetimibe (ZETIA) 10 MG tablet [[id number]]  clopidogrel (PLAVIX) 75 MG tablet [[id number]]  glipiZIDE (GLUCOTROL) 5 MG tablet [[id number]]  Has the patient contacted their pharmacy? Yes   (Agent: If yes, when and what did the pharmacy advise?) Pharmacy calling   Preferred Pharmacy (with phone number or street name):   [facility] - [place], [place] - [address]  [address] [place] [address]  Phone:  [phone] Fax:  [phone] Agent: Please be advised that RX refills may take up to 3 business days. We ask that you follow-up with your pharmacy.

## 2020-05-04 ENCOUNTER — Telehealth: Payer: Self-pay | Admitting: Pharmacist

## 2020-05-08 NOTE — Progress Notes (Addendum)
Chronic Care Management Pharmacy Assistant   Name: Benjamin Chan  MRN: [id number] DOB: 1945-11-27  Reason for Encounter: General Follow Up  Patient Questions:  1.  Have you seen any other providers since your last visit? No  2.  Any changes in your medicines or health? No     PCP : Benjamin Grooms, NP  Allergies:   Allergies  Allergen Reactions   Brilinta [Ticagrelor] Shortness Of Breath    D/c by cardiology    Medications: Outpatient Encounter Medications as of 05/04/2020  Medication Sig Note   acetaminophen (TYLENOL) 325 MG tablet Take 650 mg by mouth every 6 (six) hours as needed.    aspirin EC 81 MG tablet Take 1 tablet (81 mg total) by mouth daily.    atorvastatin (LIPITOR) 80 MG tablet Take 1 tablet (80 mg total) by mouth daily.    clopidogrel (PLAVIX) 75 MG tablet TAKE ONE TABLET BY MOUTH EVERY MORNING WITH BREAKFAST    Dulaglutide (TRULICITY) 0.75 MG/0.5ML SOPN Inject 0.75 mg into the skin once a week.    empagliflozin (JARDIANCE) 25 MG TABS tablet Take 1 tablet (25 mg total) by mouth daily.    ezetimibe (ZETIA) 10 MG tablet Take 1 tablet (10 mg total) by mouth daily.    finasteride (PROSCAR) 5 MG tablet TAKE ONE TABLET BY MOUTH ONCE DAILY    fluticasone (FLONASE) 50 MCG/ACT nasal spray Place 1 spray into both nostrils 2 (two) times daily. Pt takes twice a day 05/25/2019: Using PRN   Fluticasone-Umeclidin-Vilant (TRELEGY ELLIPTA) 100-62.5-25 MCG/INH AEPB Inhale 1 puff into the lungs daily.    furosemide (LASIX) 40 MG tablet Take by mouth.    glipiZIDE (GLUCOTROL) 5 MG tablet Take 1 tablet (5 mg total) by mouth daily with breakfast.    insulin degludec (TRESIBA FLEXTOUCH) 100 UNIT/ML FlexTouch Pen Inject 0.45 mLs (45 Units total) into the skin daily. 11/24/2019: Patient receives Basaglar through PAP   Insulin Glargine (BASAGLAR KWIKPEN) 100 UNIT/ML Inject 40 Units into the skin in the morning.    Insulin Pen Needle (PEN NEEDLES 5/16") 30G X 8 MM MISC 1 each by  Does not apply route daily.    metFORMIN (GLUCOPHAGE-XR) 500 MG 24 hr tablet Take 2 tablets (1,000 mg total) by mouth 2 (two) times daily.    metoprolol succinate (TOPROL-XL) 25 MG 24 hr tablet     metoprolol tartrate (LOPRESSOR) 25 MG tablet Take 1 tablet (25 mg total) by mouth 2 (two) times daily.    Multiple Vitamin (MULTIVITAMIN) TABS Take 1 tablet by mouth daily. Gnp century mature multivitamin tab 125c    nitroGLYCERIN (NITROSTAT) 0.4 MG SL tablet Place 1 tablet (0.4 mg total) under the tongue every 5 (five) minutes as needed for chest pain.    OneTouch Delica Lancets 30G MISC 1 each by Does not apply route daily.    No facility-administered encounter medications on file as of 05/04/2020.    Current Diagnosis: Patient Active Problem List   Diagnosis Date Noted   Chronic congestive heart failure (HCC) 01/05/2020   Diabetic ketoacidosis without coma associated with type 2 diabetes mellitus (HCC)    NSTEMI (non-ST elevated myocardial infarction) (HCC) 07/14/2019   Special screening for malignant neoplasms, colon    CKD stage 3 due to type 2 diabetes mellitus (HCC) 11/23/2017   Hyperlipidemia 07/10/2017   Venous insufficiency of both lower extremities 06/10/2017   Non-ST elevation myocardial infarction (NSTEMI), subendocardial infarction, subsequent episode of care [facility]) 04/14/2017   Coronary artery  disease involving coronary bypass graft of native heart 04/14/2017   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 classification (HCC) 04/13/2017   BPH (benign prostatic hyperplasia) 04/13/2017   Benign hypertensive renal disease 04/13/2017   DM (diabetes mellitus), type 2, uncontrolled, with renal complications (HCC) 04/13/2017   History of myocardial infarction 04/13/2017   Diabetic neuropathy (HCC) 06/12/2016   Hypertension associated with diabetes (HCC) 08/26/2012   CAD (coronary artery disease) 08/26/2012   ASD (atrial septal defect), ostium secundum 10/24/1997    Spoke to the patient's wife to  check in on the patient's health. Per the wife the patient has been doing well. Blood sugars have been running 120-170. No reports of any blood sugars below 70. The wife reported no acute needs related to medications. Reminded the wife of the patient's follow up visit with the PCP on 05-11-2020. She verbalized understanding.   Benjamin Leigh, LPN Clinical Pharmacist Assistant  [phone]    Follow-Up:  Pharmacist Review  I have reviewed the care management and care coordination activities outlined in this encounter and I am certifying that I agree with the content of this note.  Benjamin Chan. Tiburcio Pea PharmD, BCPS Clinical Pharmacist [facility] [phone]

## 2020-05-10 ENCOUNTER — Encounter: Payer: Self-pay | Admitting: Nurse Practitioner

## 2020-05-10 NOTE — Progress Notes (Deleted)
There were no vitals taken for this visit.   Subjective:    Patient ID: Benjamin Chan, male    DOB: Jul 22, 1945, 75 y.o.   MRN: [id number]  HPI: Benjamin Chan is a 75 y.o. male  No chief complaint on file.  HYPERTENSION / HYPERLIPIDEMIA - managed by Cardiology Patient saw Cardiology in October medication changes included: - Increase Lasix to 40mg  daily. You can take an extra Lasix pill as needed for weight gain >2lbs overnight or 5lbs in a week - Entresto instead of losartan.  for diabetes and your heart - Continue metoprolol 25mg  daily, spironolactone 25mg  daily, losartan 100mg  daily Patient did not keep 2 week follow up.  Has not been seen by anyone since October. Satisfied with current treatment? yes Duration of hypertension: years BP monitoring frequency: {Blank single:19197::"not checking","rarely","daily","weekly","monthly","a few times a day","a few times a week","a few times a month"} BP range:  BP medication side effects: {Blank single:19197::"yes","no"} Past BP meds: Lasix, metoprolol, losartan (cozaar), spironolactone. Duration of hyperlipidemia: years Cholesterol medication side effects: no Cholesterol supplements: none Past cholesterol medications: atorvastain (lipitor) and ezetimide (zetia) Medication compliance: excellent compliance Aspirin: yes Recent stressors: {Blank single:19197::"yes","no"} Recurrent headaches: {Blank single:19197::"yes","no"} Visual changes: {Blank single:19197::"yes","no"} Palpitations: {Blank single:19197::"yes","no"} Dyspnea: {Blank single:19197::"yes","no"} Chest pain: {Blank single:19197::"yes","no"} Lower extremity edema: {Blank single:19197::"yes","no"} Dizzy/lightheaded: {Blank single:19197::"yes","no"}  DIABETES Hypoglycemic episodes:{Blank single:19197::"yes","no"} Polydipsia/polyuria: {Blank single:19197::"yes","no"} Visual disturbance: {Blank single:19197::"yes","no"} Chest pain: {Blank  single:19197::"yes","no"} Paresthesias: {Blank single:19197::"yes","no"} Glucose Monitoring: {Blank single:19197::"yes","no"}  Accucheck frequency: {Blank single:19197::"Not Checking","Daily","BID","TID"}  Fasting glucose:  Post prandial:  Evening:  Before meals: Taking Insulin?: {Blank single:19197::"yes","no"}  Long acting insulin:  Short acting insulin: Blood Pressure Monitoring: {Blank single:19197::"not checking","rarely","daily","weekly","monthly","a few times a day","a few times a week","a few times a month"} Retinal Examination: {Blank single:19197::"Up to Date","Not up to Date"} Foot Exam: {Blank single:19197::"Up to Date","Not up to Date"} Diabetic Education: {Blank single:19197::"Completed","Not Completed"} Pneumovax: {Blank single:19197::"Up to Date","Not up to Date","unknown"} Influenza: {Blank single:19197::"Up to Date","Not up to Date","unknown"} Aspirin: {Blank single:19197::"yes","no"}  COPD COPD status: {Blank single:19197::"controlled","uncontrolled","better","worse","exacerbated","stable"} Satisfied with current treatment?: {Blank single:19197::"yes","no"} Oxygen use: {Blank single:19197::"yes","no"} Dyspnea frequency:  Cough frequency:  Rescue inhaler frequency:   Limitation of activity: {Blank single:19197::"yes","no"} Productive cough:  Last Spirometry:  Pneumovax: {Blank single:19197::"Up to Date","Not up to Date","unknown"} Influenza: {Blank single:19197::"Up to Date","Not up to Date","unknown"}  Relevant past medical, surgical, family and social history reviewed and updated as indicated. Interim medical history since our last visit reviewed. Allergies and medications reviewed and updated.  Review of Systems  Per HPI unless specifically indicated above     Objective:    There were no vitals taken for this visit.  Wt Readings from Last 3 Encounters:  01/10/20 190 lb (86.2 kg)  01/05/20 190 lb 3.2 oz (86.3 kg)  11/01/19 197 lb (89.4 kg)     Physical Exam  Results for orders placed or performed in visit on 01/10/20  Basic metabolic panel  Result Value Ref Range   Glucose 218 (H) 65 - 99 mg/dL   BUN 26 8 - 27 mg/dL   Creatinine, Ser 01/12/20 (H) 0.76 - 1.27 mg/dL   GFR calc non Af Amer 40 (L) >59 mL/min/1.73   GFR calc Af Amer 47 (L) >59 mL/min/1.73   BUN/Creatinine Ratio 16 10 - 24   Sodium 138 134 - 144 mmol/L   Potassium 4.3 3.5 - 5.2 mmol/L   Chloride 100 96 - 106 mmol/L   CO2 24 20 - 29 mmol/L   Calcium 8.9 8.6 - 10.2  mg/dL      Assessment & Plan:   Problem List Items Addressed This Visit   None      Follow up plan: No follow-ups on file.

## 2020-05-11 ENCOUNTER — Ambulatory Visit: Payer: Medicare HMO | Admitting: Nurse Practitioner

## 2020-05-11 DIAGNOSIS — I25810 Atherosclerosis of coronary artery bypass graft(s) without angina pectoris: Secondary | ICD-10-CM

## 2020-05-11 DIAGNOSIS — E1159 Type 2 diabetes mellitus with other circulatory complications: Secondary | ICD-10-CM

## 2020-05-11 DIAGNOSIS — J449 Chronic obstructive pulmonary disease, unspecified: Secondary | ICD-10-CM

## 2020-05-11 DIAGNOSIS — E1142 Type 2 diabetes mellitus with diabetic polyneuropathy: Secondary | ICD-10-CM

## 2020-05-11 DIAGNOSIS — E1122 Type 2 diabetes mellitus with diabetic chronic kidney disease: Secondary | ICD-10-CM

## 2020-05-11 DIAGNOSIS — E785 Hyperlipidemia, unspecified: Secondary | ICD-10-CM

## 2020-05-11 DIAGNOSIS — I5020 Unspecified systolic (congestive) heart failure: Secondary | ICD-10-CM

## 2020-05-11 DIAGNOSIS — E1129 Type 2 diabetes mellitus with other diabetic kidney complication: Secondary | ICD-10-CM

## 2020-05-14 ENCOUNTER — Other Ambulatory Visit: Payer: Self-pay | Admitting: Nurse Practitioner

## 2020-05-14 NOTE — Telephone Encounter (Signed)
Please get patient scheduled for a follow up 

## 2020-05-14 NOTE — Telephone Encounter (Signed)
Medication Refill - Medication: nitroGLYCERIN (NITROSTAT) 0.4 MG SL tablet     Preferred Pharmacy (with phone number or street name):  [facility] - [place], [place] - [address] [place] Phone:  [phone]  Fax:  [phone]       Agent: Please be advised that RX refills may take up to 3 business days. We ask that you follow-up with your pharmacy.

## 2020-05-14 NOTE — Telephone Encounter (Signed)
Pt will call back to make this apt. Pt;s wife has radiation and dyaslis so they will call to make this apt once she gets her schedule as pt drives wife to apts.

## 2020-05-14 NOTE — Telephone Encounter (Signed)
Pt was scheduled 2/25, called to r/s no answer, left vm

## 2020-05-18 ENCOUNTER — Other Ambulatory Visit: Payer: Self-pay | Admitting: Family Medicine

## 2020-06-06 DIAGNOSIS — I700 Atherosclerosis of aorta: Secondary | ICD-10-CM | POA: Insufficient documentation

## 2020-06-25 ENCOUNTER — Other Ambulatory Visit: Payer: Self-pay | Admitting: Family Medicine

## 2020-06-26 ENCOUNTER — Other Ambulatory Visit: Payer: Self-pay | Admitting: Family Medicine

## 2020-06-26 NOTE — Telephone Encounter (Signed)
Call to patient- wife answered and scheduled appointment for patient- #30 courtesy RF given- passes protocol

## 2020-07-02 ENCOUNTER — Telehealth: Payer: Self-pay | Admitting: Pharmacist

## 2020-07-02 ENCOUNTER — Telehealth: Payer: Self-pay

## 2020-07-02 ENCOUNTER — Ambulatory Visit: Payer: Self-pay | Admitting: Nurse Practitioner

## 2020-07-02 NOTE — Progress Notes (Signed)
Chronic Care Management Pharmacy Assistant   Name: Benjamin Chan  MRN: [id number] DOB: [birth year]  Reason for Encounter: DM Disease State/ CPP Reschedule Call   Recent office visits:  No visits noted  Recent consult visits:  No visits noted  Hospital visits:  None in previous 6 months  Medications: Outpatient Encounter Medications as of 07/02/2020  Medication Sig Note  . acetaminophen (TYLENOL) 325 MG tablet Take 650 mg by mouth every 6 (six) hours as needed.   [name] aspirin EC 81 MG tablet Take 1 tablet (81 mg total) by mouth daily.   [name] atorvastatin (LIPITOR) 80 MG tablet Take 1 tablet (80 mg total) by mouth daily.   . clopidogrel (PLAVIX) 75 MG tablet TAKE ONE TABLET BY MOUTH EVERY MORNING WITH BREAKFAST   . Dulaglutide (TRULICITY) 0.75 MG/0.5ML SOPN Inject 0.75 mg into the skin once a week.   . empagliflozin (JARDIANCE) 25 MG TABS tablet Take 1 tablet (25 mg total) by mouth daily.   [name] ezetimibe (ZETIA) 10 MG tablet Take 1 tablet (10 mg total) by mouth daily.   . finasteride (PROSCAR) 5 MG tablet TAKE ONE TABLET BY MOUTH ONCE DAILY   . fluticasone (FLONASE) 50 MCG/ACT nasal spray Place 1 spray into both nostrils 2 (two) times daily. Pt takes twice a day 05/25/2019: Using PRN  . Fluticasone-Umeclidin-Vilant (TRELEGY ELLIPTA) 100-62.5-25 MCG/INH AEPB Inhale 1 puff into the lungs daily.   . furosemide (LASIX) 40 MG tablet Take by mouth.   [name] glipiZIDE (GLUCOTROL) 5 MG tablet Take 1 tablet (5 mg total) by mouth daily with breakfast.   . insulin degludec (TRESIBA FLEXTOUCH) 100 UNIT/ML FlexTouch Pen Inject 0.45 mLs (45 Units total) into the skin daily. 11/24/2019: Patient receives Basaglar through PAP  . Insulin Glargine (BASAGLAR KWIKPEN) 100 UNIT/ML Inject 40 Units into the skin in the morning.   . Insulin Pen Needle (PEN NEEDLES 5/16") 30G X 8 MM MISC 1 each by Does not apply route daily.   [name] losartan (COZAAR) 100 MG tablet Take by mouth.   . metFORMIN (GLUCOPHAGE-XR) 500 MG 24 hr  tablet Take 2 tablets (1,000 mg total) by mouth 2 (two) times daily.   . metoprolol succinate (TOPROL-XL) 25 MG 24 hr tablet    . metoprolol tartrate (LOPRESSOR) 25 MG tablet Take 1 tablet (25 mg total) by mouth 2 (two) times daily.   . Multiple Vitamin (MULTIVITAMIN) TABS Take 1 tablet by mouth daily. Gnp century mature multivitamin tab 125c   . nitroGLYCERIN (NITROSTAT) 0.4 MG SL tablet Place 1 tablet (0.4 mg total) under the tongue every 5 (five) minutes as needed for chest pain.   Letta Pate Delica Lancets 30G MISC 1 each by Does not apply route daily.   [name] spironolactone (ALDACTONE) 25 MG tablet Take by mouth. (Patient not taking: Reported on 01/10/2020)    No facility-administered encounter medications on file as of 07/02/2020.   Recent Relevant Labs: Lab Results  Component Value Date/Time   HGBA1C 6.6 01/05/2020 03:28 PM   HGBA1C 7.5 (H) 11/01/2019 08:37 AM   HGBA1C 7.6 (H) 06/27/2019 10:25 AM   HGBA1C 11.4 (H) 04/07/2018 10:24 AM    Kidney Function Lab Results  Component Value Date/Time   CREATININE 1.63 (H) 01/10/2020 11:13 AM   CREATININE 2.00 (H) 01/05/2020 03:30 PM   GFRNONAA 40 (L) 01/10/2020 11:13 AM   GFRAA 47 (L) 01/10/2020 11:13 AM    Current antihyperglycemic regimen:  Basaglar 40 units QAM glipizide 5 mg QAM metformin 1000  mg BID  Jardiance 25 mg Trulicity 0.75 mg weekly  What recent interventions/DTPs have been made to improve glycemic control:  No recent interventions  Have there been any recent hospitalizations or ED visits since last visit with CPP? No  Patient denies hypoglycemic symptoms, including Pale, Sweaty, Shaky, Hungry, Nervous/irritable and Vision changes  Patient reports hyperglycemic symptoms, including excessive sleepiness  How often are you checking your blood sugar?  Patient's wife states that her husband does not usually check his BS but they have been running a bit high.  What are your blood sugars ranging?  o Fasting:  171-200 o Before meals: n/a o After meals: n/a o Bedtime: n/a  During the week, how often does your blood glucose drop below 70? Never   Are you checking your feet daily/regularly?  Patient's wife stated that her husband has been in good health   Adherence Review: Is the patient currently on a STATIN medication? Yes Is the patient currently on ACE/ARB medication? Yes  Does the patient have >5 day gap between last estimated fill dates? No Patient's wife stated that all rx were refilled 06/29/20 for 30 DS. Last filled on chart 05/14/20 Basaglar 40 units  glipizide 5 mg metformin 1000 mg Jardiance 25 mg  Trulicity 0.75 mg   Star Rating Drugs: Atorvastatin 80 mg Losartan 100 mg  Rescheduled CPP visit for 05/09 @ 9:00 am  Purvis Kilts CPA, [place]

## 2020-07-02 NOTE — Telephone Encounter (Signed)
Benjamin Chan relayed to CPA that he missed his appointment today and would like help rescheduling.  Thank You.

## 2020-07-02 NOTE — Progress Notes (Deleted)
There were no vitals taken for this visit.   Subjective:    Patient ID: Benjamin Chan, male    DOB: 07-06-1945, 75 y.o.   MRN: [id number]  HPI: Benjamin Chan is a 75 y.o. male  No chief complaint on file.  HYPERTENSION / HYPERLIPIDEMIA Satisfied with current treatment? {Blank single:19197::"yes","no"} Duration of hypertension: {Blank single:19197::"chronic","months","years"} BP monitoring frequency: {Blank single:19197::"not checking","rarely","daily","weekly","monthly","a few times a day","a few times a week","a few times a month"} BP range:  BP medication side effects: {Blank single:19197::"yes","no"} Past BP meds: {Blank multiple:19196::"none","amlodipine","amlodipine/benazepril","atenolol","benazepril","benazepril/HCTZ","bisoprolol (bystolic)","carvedilol","chlorthalidone","clonidine","diltiazem","exforge HCT","HCTZ","irbesartan (avapro)","labetalol","lisinopril","lisinopril-HCTZ","losartan (cozaar)","methyldopa","nifedipine","olmesartan (benicar)","olmesartan-HCTZ","quinapril","ramipril","spironalactone","tekturna","valsartan","valsartan-HCTZ","verapamil"} Duration of hyperlipidemia: {Blank single:19197::"chronic","months","years"} Cholesterol medication side effects: {Blank single:19197::"yes","no"} Cholesterol supplements: {Blank multiple:19196::"none","fish oil","niacin","red yeast rice"} Past cholesterol medications: {Blank multiple:19196::"none","atorvastain (lipitor)","lovastatin (mevacor)","pravastatin (pravachol)","rosuvastatin (crestor)","simvastatin (zocor)","vytorin","fenofibrate (tricor)","gemfibrozil","ezetimide (zetia)","niaspan","lovaza"} Medication compliance: {Blank single:19197::"excellent compliance","good compliance","fair compliance","poor compliance"} Aspirin: {Blank single:19197::"yes","no"} Recent stressors: {Blank single:19197::"yes","no"} Recurrent headaches: {Blank single:19197::"yes","no"} Visual changes: {Blank  single:19197::"yes","no"} Palpitations: {Blank single:19197::"yes","no"} Dyspnea: {Blank single:19197::"yes","no"} Chest pain: {Blank single:19197::"yes","no"} Lower extremity edema: {Blank single:19197::"yes","no"} Dizzy/lightheaded: {Blank single:19197::"yes","no"}  DIABETES Hypoglycemic episodes:{Blank single:19197::"yes","no"} Polydipsia/polyuria: {Blank single:19197::"yes","no"} Visual disturbance: {Blank single:19197::"yes","no"} Chest pain: {Blank single:19197::"yes","no"} Paresthesias: {Blank single:19197::"yes","no"} Glucose Monitoring: {Blank single:19197::"yes","no"}  Accucheck frequency: {Blank single:19197::"Not Checking","Daily","BID","TID"}  Fasting glucose:  Post prandial:  Evening:  Before meals: Taking Insulin?: {Blank single:19197::"yes","no"}  Long acting insulin:  Short acting insulin: Blood Pressure Monitoring: {Blank single:19197::"not checking","rarely","daily","weekly","monthly","a few times a day","a few times a week","a few times a month"} Retinal Examination: {Blank single:19197::"Up to Date","Not up to Date"} Foot Exam: {Blank single:19197::"Up to Date","Not up to Date"} Diabetic Education: {Blank single:19197::"Completed","Not Completed"} Pneumovax: {Blank single:19197::"Up to Date","Not up to Date","unknown"} Influenza: {Blank single:19197::"Up to Date","Not up to Date","unknown"} Aspirin: {Blank single:19197::"yes","no"}  CONGESTIVE HEART FAILURE   Relevant past medical, surgical, family and social history reviewed and updated as indicated. Interim medical history since our last visit reviewed. Allergies and medications reviewed and updated.  Review of Systems  Per HPI unless specifically indicated above     Objective:    There were no vitals taken for this visit.  Wt Readings from Last 3 Encounters:  01/10/20 190 lb (86.2 kg)  01/05/20 190 lb 3.2 oz (86.3 kg)  11/01/19 197 lb (89.4 kg)    Physical Exam  Results for orders placed or  performed in visit on 01/10/20  Basic metabolic panel  Result Value Ref Range   Glucose 218 (H) 65 - 99 mg/dL   BUN 26 8 - 27 mg/dL   Creatinine, Ser 7.65 (H) 0.76 - 1.27 mg/dL   GFR calc non Af Amer 40 (L) >59 mL/min/1.73   GFR calc Af Amer 47 (L) >59 mL/min/1.73   BUN/Creatinine Ratio 16 10 - 24   Sodium 138 134 - 144 mmol/L   Potassium 4.3 3.5 - 5.2 mmol/L   Chloride 100 96 - 106 mmol/L   CO2 24 20 - 29 mmol/L   Calcium 8.9 8.6 - 10.2 mg/dL      Assessment & Plan:   Problem List Items Addressed This Visit      Cardiovascular and Mediastinum   Hypertension associated with diabetes (HCC)   Chronic congestive heart failure (HCC) - Primary   HFrEF (heart failure with reduced ejection fraction) (HCC)     Respiratory   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 classification (HCC)     Endocrine   DM (diabetes mellitus), type 2, uncontrolled, with renal complications (HCC)   CKD stage 3 due to type 2 diabetes mellitus (HCC)       Follow up plan: No follow-ups on file.

## 2020-07-04 ENCOUNTER — Encounter: Payer: Self-pay | Admitting: Nurse Practitioner

## 2020-07-04 ENCOUNTER — Other Ambulatory Visit: Payer: Self-pay

## 2020-07-04 ENCOUNTER — Ambulatory Visit (INDEPENDENT_AMBULATORY_CARE_PROVIDER_SITE_OTHER): Payer: Medicare HMO | Admitting: Nurse Practitioner

## 2020-07-04 VITALS — BP 107/66 | HR 74 | Temp 97.9°F

## 2020-07-04 DIAGNOSIS — I152 Hypertension secondary to endocrine disorders: Secondary | ICD-10-CM | POA: Diagnosis not present

## 2020-07-04 DIAGNOSIS — E1165 Type 2 diabetes mellitus with hyperglycemia: Secondary | ICD-10-CM | POA: Diagnosis not present

## 2020-07-04 DIAGNOSIS — I5020 Unspecified systolic (congestive) heart failure: Secondary | ICD-10-CM | POA: Diagnosis not present

## 2020-07-04 DIAGNOSIS — N183 Chronic kidney disease, stage 3 unspecified: Secondary | ICD-10-CM

## 2020-07-04 DIAGNOSIS — J449 Chronic obstructive pulmonary disease, unspecified: Secondary | ICD-10-CM

## 2020-07-04 DIAGNOSIS — I509 Heart failure, unspecified: Secondary | ICD-10-CM | POA: Diagnosis not present

## 2020-07-04 DIAGNOSIS — E1129 Type 2 diabetes mellitus with other diabetic kidney complication: Secondary | ICD-10-CM

## 2020-07-04 DIAGNOSIS — E1122 Type 2 diabetes mellitus with diabetic chronic kidney disease: Secondary | ICD-10-CM | POA: Diagnosis not present

## 2020-07-04 DIAGNOSIS — E1159 Type 2 diabetes mellitus with other circulatory complications: Secondary | ICD-10-CM | POA: Diagnosis not present

## 2020-07-04 DIAGNOSIS — IMO0002 Reserved for concepts with insufficient information to code with codable children: Secondary | ICD-10-CM

## 2020-07-04 DIAGNOSIS — I700 Atherosclerosis of aorta: Secondary | ICD-10-CM | POA: Diagnosis not present

## 2020-07-04 DIAGNOSIS — I25810 Atherosclerosis of coronary artery bypass graft(s) without angina pectoris: Secondary | ICD-10-CM

## 2020-07-04 DIAGNOSIS — D519 Vitamin B12 deficiency anemia, unspecified: Secondary | ICD-10-CM | POA: Diagnosis not present

## 2020-07-04 DIAGNOSIS — E785 Hyperlipidemia, unspecified: Secondary | ICD-10-CM

## 2020-07-04 MED ORDER — FLUTICASONE PROPIONATE 50 MCG/ACT NA SUSP: 1.0000 | Freq: Two times a day (BID) | NASAL | 2 refills | Status: DC

## 2020-07-04 MED ORDER — ALBUTEROL SULFATE HFA 108 (90 BASE) MCG/ACT IN AERS: 2.0000 | INHALATION_SPRAY | Freq: Four times a day (QID) | RESPIRATORY_TRACT | 2 refills | Status: DC | PRN

## 2020-07-04 MED ORDER — AZITHROMYCIN 250 MG PO TABS: ORAL_TABLET | ORAL | 0 refills | Status: DC

## 2020-07-04 NOTE — Assessment & Plan Note (Signed)
Rechecking labs today. Await results. Continue current regimen. Continue to monitor. Call with any concerns.  

## 2020-07-04 NOTE — Assessment & Plan Note (Signed)
Chronic.  Controlled on current regimen.  Runs 120/80s at home.  Has not seen Cardiology.  Information for Cardiologist given to patient during visit.  Encouraged to make appointment.  Follow up in 3 months.  Labs drawn today.

## 2020-07-04 NOTE — Assessment & Plan Note (Signed)
Chronic.  Endorses SOB due to experiencing a cold. Not using Albuterol nebulizer but states he is using Trelegy.  Albuterol inhaler sent for patient today.  Follow up in 3 months.  Call if symptoms do not resolve.

## 2020-07-04 NOTE — Addendum Note (Signed)
Addended by: Larae Grooms on: 07/04/2020 09:43 AM   Modules accepted: Orders

## 2020-07-04 NOTE — Assessment & Plan Note (Signed)
Chronic.  Controlled on current regimen.  Has not seen Cardiology.  Information for Cardiologist given to patient during visit.  Encouraged to make appointment.  Follow up in 3 months.  Labs drawn today.  Unsure of which Metoprolol he is taking.  Will call the pharmacy to see which one he is taking.  

## 2020-07-04 NOTE — Assessment & Plan Note (Signed)
Rechecking labs today. Await results. Continue current regimen. Continue to monitor. Call with any concerns.

## 2020-07-04 NOTE — Progress Notes (Signed)
BP 107/66   Pulse 74   Temp 97.9 F (36.6 C)   SpO2 99%    Subjective:    Patient ID: Benjamin Chan, male    DOB: Aug 08, 1945, 75 y.o.   MRN: [id number]  HPI: Benjamin Chan is a 75 y.o. male  Chief Complaint  Patient presents with  . Diabetes   HYPERTENSION / HYPERLIPIDEMIA Satisfied with current treatment? yes Duration of hypertension: years BP monitoring frequency: daily BP range: 120/80 BP medication side effects: no Past BP meds: Lasix, Losartan, Metoprolol Duration of hyperlipidemia: years Cholesterol medication side effects: no Cholesterol supplements: none Past cholesterol medications: atorvastatin and Zetia Medication compliance: excellent compliance Aspirin: Plavix Recent stressors: no Recurrent headaches: no Visual changes: no Palpitations: no Dyspnea: yes Chest pain: no Lower extremity edema: no Dizzy/lightheaded: no  DIABETES Hypoglycemic episodes:no Polydipsia/polyuria: no Visual disturbance: no Chest pain: no Paresthesias: no Glucose Monitoring: no  Accucheck frequency: Daily  Fasting glucose: 130  Post prandial:  Evening:  Before meals: Taking Insulin?: yes  Long acting insulin: 45  Short acting insulin: Blood Pressure Monitoring: daily Retinal Examination: Not up to Date Foot Exam: Not up to Date Diabetic Education: Not Completed Pneumovax: Up to Date Influenza: Up to Date Aspirin: Plavix  CONGESTIVE HEART FAILURE Has not seen any Cardiologist since his last hospitalization in October.  Patient was transferred to [facility].  Not sure of who his Cardiologist is.  Not positive on medications, he gets them in bubble packs and takes what is given to him. Does not have any swelling or chest pain.  But states he has some SOB.  Has been suffering from a cold that won't go away.  Does not have a albuterol inhaler only the nebulizer which he hasn't been using.   Relevant past medical, surgical, family and social history reviewed and updated  as indicated. Interim medical history since our last visit reviewed. Allergies and medications reviewed and updated.  Review of Systems  Eyes: Negative for visual disturbance.  Respiratory: Positive for shortness of breath. Negative for chest tightness.   Cardiovascular: Negative for chest pain, palpitations and leg swelling.  Endocrine: Negative for polydipsia and polyuria.  Neurological: Negative for dizziness, light-headedness, numbness and headaches.    Per HPI unless specifically indicated above     Objective:    BP 107/66   Pulse 74   Temp 97.9 F (36.6 C)   SpO2 99%   Wt Readings from Last 3 Encounters:  01/10/20 190 lb (86.2 kg)  01/05/20 190 lb 3.2 oz (86.3 kg)  11/01/19 197 lb (89.4 kg)    Physical Exam Vitals and nursing note reviewed.  Constitutional:      General: He is not in acute distress.    Appearance: Normal appearance. He is not ill-appearing, toxic-appearing or diaphoretic.  HENT:     Head: Normocephalic.     Right Ear: External ear normal.     Left Ear: External ear normal.     Nose: Nose normal. No congestion or rhinorrhea.     Mouth/Throat:     Mouth: Mucous membranes are moist.  Eyes:     General:        Right eye: No discharge.        Left eye: No discharge.     Extraocular Movements: Extraocular movements intact.     Conjunctiva/sclera: Conjunctivae normal.     Pupils: Pupils are equal, round, and reactive to light.  Cardiovascular:     Rate and Rhythm: Normal rate  and regular rhythm.     Heart sounds: No murmur heard.   Pulmonary:     Effort: Pulmonary effort is normal. No tachypnea, accessory muscle usage or respiratory distress.     Breath sounds: Decreased air movement present. No wheezing, rhonchi or rales.  Abdominal:     General: Abdomen is flat. Bowel sounds are normal.  Musculoskeletal:     Cervical back: Normal range of motion and neck supple.     Right lower leg: No edema.     Left lower leg: No edema.  Skin:     General: Skin is warm and dry.     Capillary Refill: Capillary refill takes less than 2 seconds.  Neurological:     General: No focal deficit present.     Mental Status: He is alert and oriented to person, place, and time.  Psychiatric:        Mood and Affect: Mood normal.        Behavior: Behavior normal.        Thought Content: Thought content normal.        Judgment: Judgment normal.     Results for orders placed or performed in visit on 62/56/38  Basic metabolic panel  Result Value Ref Range   Glucose 218 (H) 65 - 99 mg/dL   BUN 26 8 - 27 mg/dL   Creatinine, Ser 1.63 (H) 0.76 - 1.27 mg/dL   GFR calc non Af Amer 40 (L) >59 mL/min/1.73   GFR calc Af Amer 47 (L) >59 mL/min/1.73   BUN/Creatinine Ratio 16 10 - 24   Sodium 138 134 - 144 mmol/L   Potassium 4.3 3.5 - 5.2 mmol/L   Chloride 100 96 - 106 mmol/L   CO2 24 20 - 29 mmol/L   Calcium 8.9 8.6 - 10.2 mg/dL      Assessment & Plan:   Problem List Items Addressed This Visit      Cardiovascular and Mediastinum   Hypertension associated with diabetes ([place]) - Primary    Chronic.  Controlled on current regimen.  Runs 120/80s at home.  Has not seen Cardiology.  Information for Cardiologist given to patient during visit.  Encouraged to make appointment.  Follow up in 3 months.  Labs drawn today.        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  Controlled on current regimen.  Has not seen Cardiology.  Information for Cardiologist given to patient during visit.  Encouraged to make appointment.  Follow up in 3 months.  Labs drawn today.  Unsure of which Metoprolol he is taking.  Will call the pharmacy to see which one he is taking.       Chronic congestive heart failure (HCC)    Chronic.  Controlled on current regimen.  Has not seen Cardiology.  Information for Cardiologist given to patient during visit.  Encouraged to make appointment.   Follow up in 3 months.  Labs drawn today.  Unsure of which Metoprolol he is taking.  Will call the pharmacy to see which one he is taking.       Relevant Orders   Comp Met (CMET)   HFrEF (heart failure with reduced ejection fraction) (HCC)    Chronic.  Controlled on current regimen.  Has not seen Cardiology.  Information for Cardiologist given to patient during visit.  Encouraged to make appointment.  Follow up in  3 months.  Labs drawn today.  Unsure of which Metoprolol he is taking.  Will call the pharmacy to see which one he is taking.       Aortic atherosclerosis (HCC)    Chronic.  Controlled.  Lipids drawn today.  Continue with atorvastatin and zetia.  Will adjust as needed.         Respiratory   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 classification (HCC)    Chronic.  Endorses SOB due to experiencing a cold. Not using Albuterol nebulizer but states he is using Trelegy.  Albuterol inhaler sent for patient today.  Follow up in 3 months.  Call if symptoms do not resolve.       Relevant Medications   fluticasone (FLONASE) 50 MCG/ACT nasal spray   albuterol (VENTOLIN HFA) 108 (90 Base) MCG/ACT inhaler   azithromycin (ZITHROMAX) 250 MG tablet     Endocrine   DM (diabetes mellitus), type 2, uncontrolled, with renal complications ([place])    Rechecking labs today. Await results. Continue current regimen. Continue to monitor. Call with any concerns.       Relevant Orders   Vitamin B12   HgB A1c   CKD stage 3 due to type 2 diabetes mellitus ([place])    Rechecking labs today. Await results. Continue current regimen. Continue to monitor. Call with any concerns.         Other   Hyperlipidemia    Rechecking labs today. Await results. Continue current regimen. Continue to monitor. Call with any concerns.       Relevant Orders   Lipid panel       Follow up plan: Return in about 3 months (around 10/03/2020) for HTN, HLD, DM2 FU.

## 2020-07-04 NOTE — Assessment & Plan Note (Signed)
Chronic.  Controlled on current regimen.  Has not seen Cardiology.  Information for Cardiologist given to patient during visit.  Encouraged to make appointment.  Follow up in 3 months.  Labs drawn today.  Unsure of which Metoprolol he is taking.  Will call the pharmacy to see which one he is taking.

## 2020-07-04 NOTE — Assessment & Plan Note (Signed)
Chronic.  Controlled.  Lipids drawn today.  Continue with atorvastatin and zetia.  Will adjust as needed.

## 2020-07-05 LAB — COMPREHENSIVE METABOLIC PANEL WITH GFR
ALT: 18 IU/L (ref 0–44)
AST: 18 IU/L (ref 0–40)
Albumin/Globulin Ratio: 1.1 — ABNORMAL LOW (ref 1.2–2.2)
Albumin: 3.7 g/dL (ref 3.7–4.7)
Alkaline Phosphatase: 80 IU/L (ref 44–121)
BUN/Creatinine Ratio: 14 (ref 10–24)
BUN: 24 mg/dL (ref 8–27)
Bilirubin Total: 0.3 mg/dL (ref 0.0–1.2)
CO2: 21 mmol/L (ref 20–29)
Calcium: 8.9 mg/dL (ref 8.6–10.2)
Chloride: 100 mmol/L (ref 96–106)
Creatinine, Ser: 1.69 mg/dL — ABNORMAL HIGH (ref 0.76–1.27)
Globulin, Total: 3.3 g/dL (ref 1.5–4.5)
Glucose: 137 mg/dL — ABNORMAL HIGH (ref 65–99)
Potassium: 4.9 mmol/L (ref 3.5–5.2)
Sodium: 138 mmol/L (ref 134–144)
Total Protein: 7 g/dL (ref 6.0–8.5)
eGFR: 42 mL/min/1.73 — ABNORMAL LOW

## 2020-07-05 LAB — HEMOGLOBIN A1C
Est. average glucose Bld gHb Est-mCnc: 209 mg/dL
Hgb A1c MFr Bld: 8.9 % — ABNORMAL HIGH (ref 4.8–5.6)

## 2020-07-05 LAB — VITAMIN B12: Vitamin B-12: 240 pg/mL (ref 232–1245)

## 2020-07-05 LAB — LIPID PANEL
Chol/HDL Ratio: 3.5 ratio (ref 0.0–5.0)
Cholesterol, Total: 116 mg/dL (ref 100–199)
HDL: 33 mg/dL — ABNORMAL LOW (ref >39)
LDL Chol Calc (NIH): 69 mg/dL (ref 0–99)
Triglycerides: 64 mg/dL (ref 0–149)
VLDL Cholesterol Cal: 14 mg/dL (ref 5–40)

## 2020-07-05 MED ORDER — TRULICITY 1.5 MG/0.5ML ~~LOC~~ SOAJ: 1.5000 mg | SUBCUTANEOUS | 1 refills | Status: DC

## 2020-07-05 NOTE — Progress Notes (Signed)
Please let patient know that his cholesterol is well controlled. His kidney function remains stable.  However, his A1c increased from 7.5 to 8.9.  Due to this we need to increase his Trulicity from .75mg  weekly to 1.5.mg weekly.  I have sent a new prescription to the pharmacy for the patient.  Plesae let me know if he has any questions.

## 2020-07-05 NOTE — Addendum Note (Signed)
Addended by: Larae Grooms on: 07/05/2020 08:29 AM   Modules accepted: Orders

## 2020-07-09 DIAGNOSIS — J410 Simple chronic bronchitis: Secondary | ICD-10-CM | POA: Diagnosis not present

## 2020-07-09 DIAGNOSIS — I5020 Unspecified systolic (congestive) heart failure: Secondary | ICD-10-CM | POA: Diagnosis not present

## 2020-07-09 DIAGNOSIS — N183 Chronic kidney disease, stage 3 unspecified: Secondary | ICD-10-CM | POA: Diagnosis not present

## 2020-07-09 DIAGNOSIS — I2510 Atherosclerotic heart disease of native coronary artery without angina pectoris: Secondary | ICD-10-CM | POA: Diagnosis not present

## 2020-07-09 DIAGNOSIS — N1831 Chronic kidney disease, stage 3a: Secondary | ICD-10-CM | POA: Diagnosis not present

## 2020-07-09 DIAGNOSIS — E1122 Type 2 diabetes mellitus with diabetic chronic kidney disease: Secondary | ICD-10-CM | POA: Diagnosis not present

## 2020-07-09 DIAGNOSIS — Z794 Long term (current) use of insulin: Secondary | ICD-10-CM | POA: Diagnosis not present

## 2020-07-09 DIAGNOSIS — I10 Essential (primary) hypertension: Secondary | ICD-10-CM | POA: Diagnosis not present

## 2020-07-12 ENCOUNTER — Telehealth: Payer: Self-pay

## 2020-07-12 NOTE — Chronic Care Management (AMB) (Signed)
    Chronic Care Management Pharmacy Assistant   Name: Benjamin Chan  MRN: [id number] DOB: 1945-10-11   Reason for Encounter: Patient Assistance Application      Medications: Outpatient Encounter Medications as of 07/12/2020  Medication Sig  . acetaminophen (TYLENOL) 325 MG tablet Take 650 mg by mouth every 6 (six) hours as needed.  [name] albuterol (VENTOLIN HFA) 108 (90 Base) MCG/ACT inhaler Inhale 2 puffs into the lungs every 6 (six) hours as needed for wheezing or shortness of breath.  [name] aspirin EC 81 MG tablet Take 1 tablet (81 mg total) by mouth daily.  [name] atorvastatin (LIPITOR) 80 MG tablet Take 1 tablet (80 mg total) by mouth daily.  [name] azithromycin (ZITHROMAX) 250 MG tablet Take as directed  . clopidogrel (PLAVIX) 75 MG tablet TAKE ONE TABLET BY MOUTH EVERY MORNING WITH BREAKFAST  . Dulaglutide (TRULICITY) 1.5 MG/0.5ML SOPN Inject 1.5 mg into the skin once a week.  . empagliflozin (JARDIANCE) 25 MG TABS tablet Take 1 tablet (25 mg total) by mouth daily.  [name] ezetimibe (ZETIA) 10 MG tablet Take 1 tablet (10 mg total) by mouth daily.  . finasteride (PROSCAR) 5 MG tablet TAKE ONE TABLET BY MOUTH ONCE DAILY  . fluticasone (FLONASE) 50 MCG/ACT nasal spray Place 1 spray into both nostrils 2 (two) times daily. Pt takes twice a day  . Fluticasone-Umeclidin-Vilant (TRELEGY ELLIPTA) 100-62.5-25 MCG/INH AEPB Inhale 1 puff into the lungs daily.  . furosemide (LASIX) 40 MG tablet Take by mouth.  [name] glipiZIDE (GLUCOTROL) 5 MG tablet Take 1 tablet (5 mg total) by mouth daily with breakfast.  . Insulin Glargine (BASAGLAR KWIKPEN) 100 UNIT/ML Inject 40 Units into the skin in the morning.  . Insulin Pen Needle (PEN NEEDLES 5/16") 30G X 8 MM MISC 1 each by Does not apply route daily.  [name] losartan (COZAAR) 100 MG tablet Take by mouth.  . metFORMIN (GLUCOPHAGE-XR) 500 MG 24 hr tablet Take 2 tablets (1,000 mg total) by mouth 2 (two) times daily.  . metoprolol succinate (TOPROL-XL) 25 MG 24 hr tablet   .  Multiple Vitamin (MULTIVITAMIN) TABS Take 1 tablet by mouth daily. Gnp century mature multivitamin tab 125c  . nitroGLYCERIN (NITROSTAT) 0.4 MG SL tablet Place 1 tablet (0.4 mg total) under the tongue every 5 (five) minutes as needed for chest pain.  Letta Pate Delica Lancets 30G MISC 1 each by Does not apply route daily.  [name] spironolactone (ALDACTONE) 25 MG tablet Take by mouth.   No facility-administered encounter medications on file as of 07/12/2020.    Called [employer] for [employer] and [employer]. Patients assistance application expired on 03/16/20. [name] has sent a new application to patient.  Lura Em Clinical Pharmacist Assistant [phone]

## 2020-07-20 ENCOUNTER — Other Ambulatory Visit: Payer: Self-pay | Admitting: Nurse Practitioner

## 2020-07-21 NOTE — Telephone Encounter (Signed)
Requested medication (s) are due for refill today: yes  Requested medication (s) are on the active medication list: yes  Last refill:  12/14/19  Future visit scheduled: yes  Notes to clinic:  no refill protocol information for this med   Requested Prescriptions  Pending Prescriptions Disp Refills   Multiple Vitamin (MULTIVITAMIN) TABS [Pharmacy Med Name: Gnp Century Mature Multivitamin Tab 125c] 30 tablet 6    Sig: TAKE ONE TABLET BY MOUTH ONCE DAILY      There is no refill protocol information for this order

## 2020-07-23 ENCOUNTER — Other Ambulatory Visit: Payer: Self-pay

## 2020-07-23 ENCOUNTER — Ambulatory Visit: Payer: Self-pay

## 2020-07-23 MED ORDER — FINASTERIDE 5 MG PO TABS: 5.0000 mg | ORAL_TABLET | Freq: Every day | ORAL | 1 refills | Status: DC

## 2020-07-23 NOTE — Progress Notes (Deleted)
 Chronic Care Management Pharmacy Note  07/23/2020 Name:  Benjamin Chan MRN:  [id number] DOB:  03/09/1946  Subjective: Benjamin Chan is an 74 y.o. year old male who is a primary patient of Holdsworth, Karen, NP.  The CCM team was consulted for assistance with disease management and care coordination needs.    Engaged with patient face to face for follow up visit in response to provider referral for pharmacy case management and/or care coordination services.   Consent to Services:  The patient was given information about Chronic Care Management services, agreed to services, and gave verbal consent prior to initiation of services.  Please see initial visit note for detailed documentation.   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  Objective:  Lab Results  Component Value Date   CREATININE 1.69 (H) 07/04/2020   BUN 24 07/04/2020   GFRNONAA 40 (L) 01/10/2020   GFRAA 47 (L) 01/10/2020   NA 138 07/04/2020   K 4.9 07/04/2020   CALCIUM 8.9 07/04/2020   CO2 21 07/04/2020   GLUCOSE 137 (H) 07/04/2020    Lab Results  Component Value Date/Time   HGBA1C 8.9 (H) 07/04/2020 08:37 AM   HGBA1C 6.6 01/05/2020 03:28 PM   HGBA1C 7.5 (H) 11/01/2019 08:37 AM   HGBA1C 11.4 (H) 04/07/2018 10:24 AM    Last diabetic Eye exam: No results found for: HMDIABEYEEXA  Last diabetic Foot exam: No results found for: HMDIABFOOTEX   Lab Results  Component Value Date   CHOL 116 07/04/2020   HDL 33 (L) 07/04/2020   LDLCALC 69 07/04/2020   TRIG 64 07/04/2020   CHOLHDL 3.5 07/04/2020    Hepatic Function Latest Ref  Rng & Units 07/04/2020 01/05/2020 11/01/2019  Total Protein 6.0 - 8.5 g/dL 7.0 6.9 6.9  Albumin 3.7 - 4.7 g/dL 3.7 4.2 4.1  AST 0 - 40 IU/L 18 21 16  ALT 0 - 44 IU/L 18 18 13  Alk Phosphatase 44 - 121 IU/L 80 96 87  Total Bilirubin 0.0 - 1.2 mg/dL 0.3 0.3 0.5    Lab Results  Component Value Date/Time   TSH 0.363 06/29/2015 09:39 PM    CBC Latest Ref Rng & Units 01/05/2020 07/16/2019 07/14/2019  WBC 3.4 - 10.8 x10E3/uL 10.9(H) 15.6(H) 18.1(H)  Hemoglobin 13.0 - 17.7 g/dL 13.4 14.1 14.2  Hematocrit 37.5 - 51.0 % 39.6 41.8 42.1  Platelets 150 - 450 x10E3/uL 334 294 289    No results found for: VD25OH  Clinical ASCVD: Yes  The ASCVD Risk score (Goff DC Jr., et al., 2013) failed to calculate for the following reasons:   The patient has a prior MI or stroke diagnosis    Depression screen PHQ 2/9 01/10/2020 10/21/2019 07/25/2019  Decreased Interest 0 0 0  Down, Depressed, Hopeless 0 0 0  PHQ - 2 Score 0 0 0  Altered sleeping 0 1 -  Tired, decreased energy 0 0 -  Change in appetite 0 0 -  Feeling bad or failure about yourself  0 0 -  Trouble concentrating 0 0 -  Moving slowly or fidgety/restless 0 0 -  Suicidal thoughts 0 0 -    PHQ-9 Score 0 1 -  Difficult doing work/chores Not difficult at all Not difficult at all -     ***Other: (CHADS2VASc if Afib, MMRC or CAT for COPD, ACT, DEXA)  Social History   Tobacco Use  Smoking Status Former Smoker  . Years: 40.00  . Types: Cigarettes  . Quit date: 2010  . Years since quitting: 12.3  Smokeless Tobacco Never Used   BP Readings from Last 3 Encounters:  07/04/20 107/66  01/10/20 106/69  01/05/20 (!) 92/59   Pulse Readings from Last 3 Encounters:  07/04/20 74  01/10/20 87  01/05/20 84   Wt Readings from Last 3 Encounters:  01/10/20 190 lb (86.2 kg)  01/05/20 190 lb 3.2 oz (86.3 kg)  11/01/19 197 lb (89.4 kg)   BMI Readings from Last 3 Encounters:  01/10/20 30.67 kg/m  01/05/20 30.70 kg/m  11/01/19 31.80 kg/m     Assessment/Interventions: Review of patient past medical history, allergies, medications, health status, including review of consultants reports, laboratory and other test data, was performed as part of comprehensive evaluation and provision of chronic care management services.   SDOH:  (Social Determinants of Health) assessments and interventions performed: {yes/no:20286}  SDOH Screenings   Alcohol Screen: Not on file  Depression (PHQ2-9): Low Risk   . PHQ-2 Score: 0  Financial Resource Strain: Low Risk   . Difficulty of Paying Living Expenses: Not hard at all  Food Insecurity: No Food Insecurity  . Worried About Running Out of Food in the Last Year: Never true  . Ran Out of Food in the Last Year: Never true  Housing: Not on file  Physical Activity: Inactive  . Days of Exercise per Week: 0 days  . Minutes of Exercise per Session: 0 min  Social Connections: Not on file  Stress: No Stress Concern Present  . Feeling of Stress : Not at all  Tobacco Use: Medium Risk  . Smoking Tobacco Use: Former Smoker  . Smokeless Tobacco Use: Never Used  Transportation Needs: No Transportation Needs  . Lack of Transportation (Medical): No  . Lack of Transportation (Non-Medical): No     Immunization History  Administered Date(s) Administered  . Influenza Inj Mdck Quad Pf 12/28/2018  . Influenza, High Dose Seasonal PF 12/31/2016, 11/23/2017  . Influenza, Seasonal, Injecte, Preservative Fre 01/22/2006, 01/10/2008, 02/05/2009, 02/19/2010, 03/28/2011, 12/25/2011  . Influenza,inj,Quad PF,6+ Mos 12/13/2013, 11/21/2014, 01/25/2016, 12/28/2019  . Influenza-Unspecified 02/15/2013, 12/31/2016, 12/28/2018  . Moderna Sars-Covid-2 Vaccination 04/21/2019, 05/19/2019, 03/18/2020  . Pneumococcal Conjugate-13 08/15/2014  . Pneumococcal Polysaccharide-23 05/02/2003, 02/19/2010    Conditions to be addressed/monitored:  Hypertension, Hyperlipidemia, Diabetes, Heart Failure, Coronary Artery Disease, COPD  and Chronic Kidney Disease  There are no care plans that you recently modified to display for this patient.    Medication Assistance: {MEDASSISTANCEINFO:25044}  Patient's preferred pharmacy is:  [facility] - [place], [place] - [address] [place] [address] Phone: [phone] Fax: [phone]  Uses pill box? {Yes or If no, why not?:20788} Pt endorses ***% compliance  We discussed: {Pharmacy options:24294} Patient decided to: {US Pharmacy Plan:23885}  Care Plan and Follow Up Patient Decision:  {FOLLOWUP:24991}  Plan: {CM FOLLOW UP PLAN:25073}  ***     

## 2020-07-23 NOTE — Telephone Encounter (Signed)
Pt has apt on 07/04/2020 next apt on 10/03/2020 per note in chart Return in about 3 months (around 10/03/2020) for HTN, HLD, DM2 FU. Would he need another apt?

## 2020-07-25 ENCOUNTER — Ambulatory Visit (INDEPENDENT_AMBULATORY_CARE_PROVIDER_SITE_OTHER): Payer: Medicare HMO | Admitting: Pharmacist

## 2020-07-25 DIAGNOSIS — J449 Chronic obstructive pulmonary disease, unspecified: Secondary | ICD-10-CM

## 2020-07-25 DIAGNOSIS — E1165 Type 2 diabetes mellitus with hyperglycemia: Secondary | ICD-10-CM

## 2020-07-25 DIAGNOSIS — IMO0002 Reserved for concepts with insufficient information to code with codable children: Secondary | ICD-10-CM

## 2020-07-25 DIAGNOSIS — E1129 Type 2 diabetes mellitus with other diabetic kidney complication: Secondary | ICD-10-CM

## 2020-07-25 DIAGNOSIS — I5020 Unspecified systolic (congestive) heart failure: Secondary | ICD-10-CM

## 2020-07-25 NOTE — Progress Notes (Addendum)
Chronic Care Management Pharmacy Note  07/26/2020 Name:  Benjamin Chan MRN:  [id number] DOB:  26-Jun-1945  Subjective: Benjamin Chan is an 75 y.o. year old male who is a primary patient of Benjamin Billings, NP.  The CCM team was consulted for assistance with disease management and care coordination needs.    Engaged with patient face to face for follow up visit in response to provider referral for pharmacy case management and/or care coordination services.   Consent to Services:  The patient was given information about Chronic Care Management services, agreed to services, and gave verbal consent prior to initiation of services.  Please see initial visit note for detailed documentation.   Patient Care Team: Benjamin Billings, NP as PCP - Benjamin Chan, Benjamin Chan, [facility] as Pharmacist (Pharmacist)  Recent office visits: 4/20/22[name], NP- (PCP)-blood work, albuterol inhaler rx  Recent consult visits: 07/09/20-Benjamin Chan(cardiology HF clinic)- lasix changed to 1.5 tabs (60 mg) qd, return in 3 weeks, continue clopidogrel -Benjamin Chan visits: 10/11 - 10/13//22- Benjamin Chan- Dr. 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%, losartan 100 mg ,  amlodipine d/c, toprol Xl 25 mg qd, spironolactone 25 mg , continue plavix,  Objective:  Lab Results  Component Value Date   CREATININE 1.69 (H) 07/04/2020   BUN 24 07/04/2020   GFRNONAA 40 (L) 01/10/2020   GFRAA 47 (L) 01/10/2020   NA 138 07/04/2020   K 4.9 07/04/2020   CALCIUM 8.9 07/04/2020   CO2 21 07/04/2020   GLUCOSE 137 (H) 07/04/2020    Lab Results  Component Value Date/Time   HGBA1C 8.9 (H) 07/04/2020 08:37 AM   HGBA1C 6.6 01/05/2020 03:28 PM   HGBA1C 7.5 (H) 11/01/2019 08:37 AM   HGBA1C 11.4 (H) 04/07/2018 10:24 AM    Last diabetic Eye exam: No results found for: HMDIABEYEEXA  Last diabetic Foot exam: No results found for: HMDIABFOOTEX   Lab Results  Component Value Date   CHOL 116 07/04/2020   HDL 33 (L) 07/04/2020   LDLCALC  69 07/04/2020   TRIG 64 07/04/2020   CHOLHDL 3.5 07/04/2020    Hepatic Function Latest Ref Rng & Units 07/04/2020 01/05/2020 11/01/2019  Total Protein 6.0 - 8.5 g/dL 7.0 6.9 6.9  Albumin 3.7 - 4.7 g/dL 3.7 4.2 4.1  AST 0 - 40 IU/L $Remov'18 21 16  'cGArcf$ ALT 0 - 44 IU/L $Remov'18 18 13  'nNwfxk$ Alk Phosphatase 44 - 121 IU/L 80 96 87  Total Bilirubin 0.0 - 1.2 mg/dL 0.3 0.3 0.5    Lab Results  Component Value Date/Time   TSH 0.363 06/29/2015 09:39 PM    CBC Latest Ref Rng & Units 01/05/2020 07/16/2019 07/14/2019  WBC 3.4 - 10.8 x10E3/uL 10.9(H) 15.6(H) 18.1(H)  Hemoglobin 13.0 - 17.7 g/dL 13.4 14.1 14.2  Hematocrit 37.5 - 51.0 % 39.6 41.8 42.1  Platelets 150 - 450 x10E3/uL 334 294 289    No results found for: VD25OH  Clinical ASCVD: Yes  The ASCVD Risk score Benjamin Chan., et al., 2013) failed to calculate for the following reasons:   The patient has a prior MI or stroke diagnosis    Depression screen [facility] 2/9 01/10/2020 10/21/2019 07/25/2019  Decreased Interest 0 0 0  Down, Depressed, Hopeless 0 0 0  PHQ - 2 Score 0 0 0  Altered sleeping 0 1 -  Tired, decreased energy 0 0 -  Change in appetite 0 0 -  Feeling bad or failure about yourself  0 0 -  Trouble concentrating 0 0 -  Moving slowly or fidgety/restless 0 0 -  Suicidal thoughts 0 0 -  PHQ-9 Score 0 1 -  Difficult doing work/chores Not difficult at all Not difficult at all -       Social History   Tobacco Use  Smoking Status Former Smoker  . Years: 40.00  . Types: Cigarettes  . Quit date: 2010  . Years since quitting: 12.3  Smokeless Tobacco Never Used   BP Readings from Last 3 Encounters:  07/04/20 107/66  01/10/20 106/69  01/05/20 (!) 92/59   Pulse Readings from Last 3 Encounters:  07/04/20 74  01/10/20 87  01/05/20 84   Wt Readings from Last 3 Encounters:  01/10/20 190 lb (86.2 kg)  01/05/20 190 lb 3.2 oz (86.3 kg)  11/01/19 197 lb (89.4 kg)   BMI Readings from Last 3 Encounters:  01/10/20 30.67 kg/m  01/05/20 30.70 kg/m   11/01/19 31.80 kg/m    Assessment/Interventions: Review of patient past medical history, allergies, medications, health status, including review of consultants reports, laboratory and other test data, was performed as part of comprehensive evaluation and provision of chronic care management services.   SDOH:  (Social Determinants of Health) assessments and interventions performed: No  SDOH Screenings   Alcohol Screen: Not on file  Depression (PHQ2-9): Low Risk   . PHQ-2 Score: 0  Financial Resource Strain: Low Risk   . Difficulty of Paying Living Expenses: Not hard at all  Food Insecurity: No Food Insecurity  . Worried About Charity fundraiser in the Last Year: Never true  . Ran Out of Food in the Last Year: Never true  Housing: Not on file  Physical Activity: Inactive  . Days of Exercise per Week: 0 days  . Minutes of Exercise per Session: 0 min  Social Connections: Not on file  Stress: No Stress Concern Present  . Feeling of Stress : Not at all  Tobacco Use: Medium Risk  . Smoking Tobacco Use: Former Smoker  . Smokeless Tobacco Use: Never Used  Transportation Needs: No Transportation Needs  . Lack of Transportation (Medical): No  . Lack of Transportation (Non-Medical): No      Immunization History  Administered Date(s) Administered  . Influenza Inj Mdck Quad Pf 12/28/2018  . Influenza, High Dose Seasonal PF 12/31/2016, 11/23/2017  . Influenza, Seasonal, Injecte, Preservative Fre 01/22/2006, 01/10/2008, 02/05/2009, 02/19/2010, 03/28/2011, 12/25/2011  . Influenza,inj,Quad PF,6+ Mos 12/13/2013, 11/21/2014, 01/25/2016, 12/28/2019  . Influenza-Unspecified 02/15/2013, 12/31/2016, 12/28/2018  . Moderna Sars-Covid-2 Vaccination 04/21/2019, 05/19/2019, 03/18/2020  . Pneumococcal Conjugate-13 08/15/2014  . Pneumococcal Polysaccharide-23 05/02/2003, 02/19/2010    Conditions to be addressed/monitored:  Hypertension, Hyperlipidemia, Diabetes, Heart Failure, Coronary Artery  Disease, COPD, Chronic Kidney Disease and BPH  Care Plan : [place]  Updates made by Benjamin Chan, Benjamin Chan since 07/26/2020 12:00 AM    Problem: DM2, HTN, CKD, HFrEF, CAD, HLD, COPD, BPH   Priority: High    Goal: disease Management   Start Date: 07/25/2020  This Visit's Progress: Not on track  Priority: High  Note:   Current Barriers:  . Unable to independently afford treatment regimen . Unable to independently monitor therapeutic efficacy . Unable to achieve control of diabetes, heart failure  . Unable to maintain control of HTN, CKD . Unable to self administer medications as prescribed . Does not adhere to prescribed medication regimen . Does not maintain contact with provider office . Does not contact provider office for questions/concerns  Pharmacist Clinical Goal(s):  [name] Patient will  verbalize ability to afford treatment regimen . achieve adherence to monitoring guidelines and medication adherence to achieve therapeutic efficacy . achieve control of diabetes and heart failure as evidenced by lab values and self monitoring . achieve ability to self administer medications as prescribed through use of pill packaging as evidenced by patient report . adhere to prescribed medication regimen as evidenced by lab valures, reduced symptoms . contact provider office for questions/concerns as evidenced notation of same in electronic health record through collaboration with PharmD and provider.   Interventions: . 1:1 collaboration with Benjamin Billings, NP regarding development and update of comprehensive plan of care as evidenced by provider attestation and co-signature . Inter-disciplinary care team collaboration (see longitudinal plan of care) . Comprehensive medication review performed; medication list updated in electronic medical record .  BP Readings from Last 3 Encounters:  07/04/20 107/66  01/10/20 106/69  01/05/20 (!) 92/59   Hypertension/ CKD III (BP goal  <130/80) -Controlled -Current treatment: . Losartan 100 mg qd . Metoprolol succinate 25 mg qd . Furosemide 60 mg qd . Spironolactone 25 mg qd -Medications previously tried: NA  -Current home readings: Hasn't checked in a while. Wife recalls it was "good" -Current dietary habits: has cut out most soda, < one ginger-ale per week, drinks sweet tea, does not watch diet.. eats dhips, onion rings, vienna sausages for lunch/snacks, wife cooks dinner most nighs , chicken and rice last night -Current exercise habits: No structured regimen, he mows yards several days per week -Denies hypotensive/hypertensive symptoms -Educated on BP goals and benefits of medications for prevention of heart attack, stroke and kidney damage; Daily salt intake goal < 2300 mg; Exercise goal of 150 minutes per week; Importance of home blood pressure monitoring; Symptoms of hypotension and importance of maintaining adequate hydration; -Counseled to monitor BP at home 2-3 times weekly, document, and provide log at future appointments -Counseled on diet and exercise extensively Recommended to continue current medication Recommended Patient use wife's BP cuff and monitor regularly Educated on the importance of maintaining hydration when out mowing lawns  Lab Results  Component Value Date   CHOL 116 07/04/2020   HDL 33 (L) 07/04/2020   LDLCALC 69 07/04/2020   TRIG 64 07/04/2020   CHOLHDL 3.5 07/04/2020    Hyperlipidemia:/CAD/ H/O MI, atrial septal defect (LDL goal < 70) -Controlled -Current treatment: . Atorvastatin 80 mg qd . Aspirin 81 mg EC qd . ezetemibe 10 mg qd . Nitroglycerin  0.4 mg sl prn  . Clopidogrel 75 mg qd ( unsure if he is taking -- not in pill packs, cardiology not 4/25/states to continue--informed patient) -Medications previously tried: NA --Educated on Cholesterol goals;  Importance of limiting foods high in cholesterol; Exercise goal of 150 minutes per week; -Counseled on diet and exercise  extensively Recommended to continue current medication Counseled on taking clopidogrel daily per cardiology notes Collaborated with PCP for new SL NTG prescription  Lab Results  Component Value Date   HGBA1C 8.9 (H) 07/04/2020    Diabetes (A1c goal <7%) -Uncontrolled -Current medications: [name] Metformin xr 500 mg bid (self adjusted due to GI effects) . Trulicity 6.96 mg q week ( did not start increased dosage) . Jardiance 25 mg qd . Glipizide 5 mg qd . Basaglar 40 units BID -Medications previously tried:  -Current home glucose readings . fasting glucose: 103 this am . post prandial glucose: 220,230 -Denies hypoglycemic/hyperglycemic symptoms -Educated on A1c and blood sugar goals; Complications of diabetes including kidney damage, retinal damage, and cardiovascular  disease; Exercise goal of 150 minutes per week; Prevention and management of hypoglycemic episodes; Benefits of routine self-monitoring of blood sugar; Discussed recent A1c and non-compliance. Patient has been taking medications consistently as listed since last visit with PCP. He reports having mutliple boxed of  Basaglar and Trulicty at home from patient assistance . Patient enrollment ended 03/16/20 he should be out.. -Counseled to check feet daily and get yearly eye exams -Counseled on diet and exercise extensively Recommended to continue current medication Recommended patient continue with his regimen now that he is compliant and recheck a1c in 3 months.  Collaborated with PCP to send new RX for Metformin XR 500 mg bid so patient doesn''t have ot remove from packaging.  Assessed patient finances. Basaglar, Trulicity and jardiance PAP's will be reinitiated today.  Counseled patient to call PCP or PharmD if BG <70 or > 200 consistently (3 days in a row)  Heart Failure (Goal: manage symptoms and prevent exacerbations) -Not ideally controlled -Last ejection fraction: 35% (Date: 10/21) -HF type: Diastolic -NYHA Class:  II (slight limitation of activity) -AHA HF Stage: C (Heart disease and symptoms present) -Current treatment: . Furosemide 60 mg qd . toprol XL 25 mg qd . Losartan 100 mg qd . Spironolactone 25 mg qd -Medications previously tried: NA  -Current home BP/HR readings: unable to provide -Patient is weighing daily -Educated on Benefits of medications for managing symptoms and prolonging life Importance of weighing daily; if you gain more than 3 pounds in one day or 5 pounds in one week,  Counseled patient to keep scheduled visits with cardiology and heart failure clinic -Counseled on diet and exercise extensively Recommended to continue current medication Counseled on checking feet and legs for swelling, taking furosemide daily. 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   COPD (Goal: control symptoms and prevent exacerbations) -Controlled -Current treatment  . Trelegy Ellipta 1 puff daily . Albuterol prn -Medications previously tried: NA  -Gold Grade: Gold 3 (FEV1 30-49%) -Current COPD Classification:  B (high sx, <2 exacerbations/yr) -MMRC/CAT score: not done today -Pulmonary function testing: 2019 -Exacerbations requiring treatment in last 6 months: 0 -Patient reports consistent use of maintenance inhaler -Frequency of rescue inhaler use: several times weekly -Counseled on Benefits of consistent maintenance inhaler use When to use rescue inhaler Differences between maintenance and rescue inhalers -Counseled on diet and exercise extensively Recommended to continue current medication Recommended patient follow up with pulmonology Assessed patient finances. Trelegy PAP in process   Patient Goals/Self-Care Activities . Patient will:  - take medications as prescribed focus on medication adherence by taking medications as prescribed check glucose twice daily, document, and provide at future appointments check blood  pressure weekly, document, and provide at future appointments weigh daily, and contact provider if weight gain of 2 lbs overnight of 5 lbs in a week collaborate with provider on medication access solutions target a minimum of 150 minutes of moderate intensity exercise weekly engage in dietary modifications by reducing sodium, cutting back on sweet tea  Follow Up Plan: Telephone follow up appointment with care management team member scheduled for: CPA 2-4 weeks, PharmD 6-8 weeks.        Medication Assistance: Application for Trulicity, basaglar, Trelegy and jaridance  medication assistance program. in process.  Anticipated assistance start date 08/15/20.  See plan of care for additional detail.  Printed all new applications today.  Patient's preferred pharmacy is:  [facility],  [place] [address] Phone: [phone] Fax: [phone]  Uses pill box? No - pill packaging at [facility] Pt endorses 80% compliance  We discussed: Current pharmacy is preferred with insurance plan and patient is satisfied with pharmacy services Patient decided to: Continue current medication management strategy  Care Plan and Follow Up Patient Decision:  Patient agrees to Care Plan and Follow-up.  Plan: Telephone follow up appointment with care management team member scheduled for:  2-4 weeks CPA. PharmD 6-8 weeks  Benjamin Chan. Kenton Kingfisher PharmD, [facility] [phone]

## 2020-07-26 ENCOUNTER — Telehealth: Payer: Self-pay | Admitting: Pharmacist

## 2020-07-26 MED ORDER — LOSARTAN POTASSIUM 100 MG PO TABS: 100.0000 mg | ORAL_TABLET | Freq: Every day | ORAL | 1 refills | Status: AC

## 2020-07-26 MED ORDER — NITROGLYCERIN 0.4 MG SL SUBL: 0.4000 mg | SUBLINGUAL_TABLET | SUBLINGUAL | 12 refills | Status: AC | PRN

## 2020-07-26 MED ORDER — METFORMIN HCL ER 500 MG PO TB24: 1000.0000 mg | ORAL_TABLET | Freq: Two times a day (BID) | ORAL | 1 refills | Status: AC

## 2020-07-26 MED ORDER — SPIRONOLACTONE 25 MG PO TABS: 25.0000 mg | ORAL_TABLET | Freq: Every day | ORAL | 1 refills | Status: AC

## 2020-07-26 NOTE — Patient Instructions (Addendum)
Visit Information  It was a pleasure speaking with you today. Thank you for letting me be part of your clinical team. Please call with any questions or concerns.   Goals Addressed            This Visit's Progress   . Monitor and Manage My Blood Sugar-Diabetes Type 2       Timeframe:  Short-Term Goal Priority:  High Start Date:                             Expected End Date:                       Follow Up Date 08/27/20    - check blood sugar at prescribed times - check blood sugar if I feel it is too high or too low - enter blood sugar readings and medication or insulin into daily log - take the blood sugar meter to all doctor visits    Why is this important?    Checking your blood sugar at home helps to keep it from getting very high or very low.   Writing the results in a diary or log helps the doctor know how to care for you.   Your blood sugar log should have the time, date and the results.   Also, write down the amount of insulin or other medicine that you take.   Other information, like what you ate, exercise done and how you were feeling, will also be helpful.     Notes:     . Obtain Eye Exam-Diabetes Type 2       Follow Up Date 08/27/20    - schedule appointment with eye doctor    Why is this important?    Eye check-ups are important when you have diabetes.   Vision loss can be prevented.    Notes:     . Perform Foot Care-Diabetes Type 2       Timeframe:  Short-Term Goal Priority:  High Start Date:                             Expected End Date:                       Follow Up Date 08/27/20  - check feet daily for cuts, sores or redness - do heel pump exercise 2 to 3 times each day - keep feet up while sitting - trim toenails straight across - wear comfortable, cotton socks - wear comfortable, well-fitting shoes    Why is this important?    Good foot care is very important when you have diabetes.   There are many things you can do to keep your  feet healthy and catch a problem early.    Notes:     . Track and Manage Fluids and Swelling-Heart Failure       Timeframe:  Short-Term Goal Priority:  High Start Date:                             Expected End Date:                       Follow Up Date 08/27/20   - call office if I gain more than 2 pounds in one day  or 5 pounds in one week - keep legs up while sitting - track weight in diary - use salt in moderation - watch for swelling in feet, ankles and legs every day - weigh myself daily    Why is this important?    It is important to check your weight daily and watch how much salt and liquids you have.   It will help you to manage your heart failure.    Notes:        The patient verbalized understanding of instructions, educational materials, and care plan provided today and agreed to receive a mailed copy of patient instructions, educational materials, and care plan.   Face to Face appointment with pharmacist scheduled for:  08/27/20  [facility] Pod. Marialy Urbanczyk PharmD, BCPS Clinical Pharmacist [phone]  Preventing Diabetes Mellitus Complications You can help to prevent or slow down problems that are caused by diabetes (diabetes mellitus). Following your diabetes plan and taking care of yourself can reduce your risk of serious or life-threatening complications. What actions can I take to prevent diabetes complications? Diabetes management  Follow instructions from your health care providers about managing your diabetes. Your diabetes may be managed by a team of health care providers who can teach you how to care for yourself and can answer questions that you have.  Educate yourself about your condition so you can make healthy choices about eating and physical activity.  Know your target range for your blood sugar (glucose), and check your blood glucose level as often as told. Your health care provider will help you decide how often to check your blood glucose level  depending on your treatment goals and how well you are meeting them.  Ask your health care provider if you should take low-dose aspirin daily and what dose is recommended for you. Taking low-dose aspirin daily is recommended to help prevent cardiovascular disease.   Controlling your blood pressure and cholesterol Your personal target blood pressure is determined based on:  Your age.  Your medicines.  How long you have had diabetes.  Any other medical conditions you have. To control your blood pressure:  Follow instructions from your health care provider about meal planning, exercise, and medicines.  Make sure your health care provider checks your blood pressure at every medical visit.  Monitor your blood pressure at home as told by your health care provider. To control your cholesterol:  Follow instructions from your health care provider about meal planning, exercise, and medicines.  Have your cholesterol checked at least once a year.  You may be prescribed medicine to lower cholesterol (statin). If you are not taking a statin, ask your health care provider if you should be. Controlling your cholesterol may:  Help prevent heart disease and stroke. These are the most common health problems for people with diabetes.  Improve your blood flow.   Medical appointments and vaccines Schedule and keep yearly physical exams and eye exams. Your health care provider will tell you how often you need medical visits depending on your diabetes management plan. Keep all follow-up visits as told. This is important so possible problems can be identified early and complications can be avoided or treated.  Every visit with your health care provider should include measuring your: ? Weight. ? Blood pressure. ? Blood glucose control.  Your A1C (hemoglobin A1C) level should be checked: ? At least 2 times a year, if you are meeting your treatment goals. ? 4 times a year, if you are not meeting  treatment goals or if your treatment goals have changed.  Your blood lipids (lipid profile) should be checked yearly. You should also be checked yearly for protein in your urine (urine microalbumin).  If you have type 1 diabetes, get an eye exam 3-5 years after you are diagnosed, and then once a year after your first exam.  If you have type 2 diabetes, get an eye exam as soon as you are diagnosed, and then once a year after your first exam. It is also important to keep your vaccines current. It is recommended that you receive:  A flu (influenza) vaccine every year.  A pneumonia (pneumococcal) vaccine and a hepatitis B vaccine. If you are age 29 or older, you may get the pneumonia vaccine as a series of two separate shots. Ask your health care provider which other vaccines may be recommended. Lifestyle  Do not use any products that contain nicotine or tobacco, such as cigarettes, e-cigarettes, and chewing tobacco. If you need help quitting, ask your health care provider. By avoiding nicotine and tobacco: ? You will lower your risk for heart attack, stroke, nerve disease, and kidney disease. ? Your cholesterol and blood pressure may improve. ? Your blood circulation will improve.  If you drink alcohol: ? Limit how much you use to:  0-1 drink a day for women who are not pregnant.  0-2 drinks a day for men. ? Be aware of how much alcohol is in your drink. In the [place] one drink equals one 12 oz bottle of beer (355 mL), one 5 oz glass of wine (148 mL), or one 11?2 oz glass of hard liquor (44 mL). Taking care of your feet Diabetes may cause you to have poor blood circulation to your legs and feet. Because of this, taking care of your feet is very important. Diabetes can cause:  The skin on the feet to get thinner, break more easily, and heal more slowly.  Nerve damage in your legs and feet, which results in decreased feeling. You may not notice minor injuries that could lead to serious  problems. To avoid foot problems:  Check your skin and feet every day for cuts, bruises, redness, blisters, or sores.  Schedule a foot exam with your health care provider once every year. This exam includes: ? Inspecting the structure and skin of your feet. ? Checking the pulses and sensation in your feet.  Make sure that your health care provider performs a visual foot exam at every medical visit.   Taking care of your teeth People with poorly controlled diabetes are more likely to have gum (periodontal) disease. Diabetes can make periodontal diseases harder to control. If not treated, periodontal diseases can lead to tooth loss. To prevent this:  Brush your teeth twice a day.  Floss at least once a day.  Visit your dentist 2 times a year. Managing stress Living with diabetes can be stressful. When you are experiencing stress, your blood glucose may be affected in two ways:  Stress hormones may cause your blood glucose to rise.  You may be distracted from taking good care of yourself. Be aware of your stress level and make changes to help you manage challenging situations. To lower your stress levels:  Consider joining a support group.  Do planned relaxation or meditation.  Do a hobby that you enjoy.  Maintain healthy relationships.  Exercise regularly.  Work with your health care provider or a mental health professional. Where to find more information  American Diabetes  Association: www.diabetes.org  Association of Diabetes Care and Education Specialists: www.diabeteseducator.org Summary  You can take action to prevent or slow down problems that are caused by diabetes (diabetes mellitus). Following your diabetes plan and taking care of yourself can reduce your risk of serious or life-threatening complications.  Follow instructions from your health care providers about managing your diabetes. Your diabetes may be managed by a team of health care providers who can teach  you how to care for yourself and can answer questions that you have.  Know your target range for your blood sugar (glucose), and check your blood glucose levels as often as told. Your health care provider will help you decide how often you should check your blood glucose level depending on your treatment goals and how well you are meeting them.  Your health care provider will tell you how often you need medical visits depending on your diabetes management plan. Keep all follow-up visits as directed. This is important so possible problems can be identified early and complications can be avoided or treated. This information is not intended to replace advice given to you by your health care provider. Make sure you discuss any questions you have with your health care provider. Document Revised: 04/22/2019 Document Reviewed: 04/22/2019 Elsevier Patient Education  2021 [employer].

## 2020-07-26 NOTE — Telephone Encounter (Signed)
Medications sent to the pharmacy.

## 2020-07-26 NOTE — Addendum Note (Signed)
Addended by: Larae Grooms on: 07/26/2020 04:09 PM   Modules accepted: Orders

## 2020-07-26 NOTE — Telephone Encounter (Signed)
Would you send new Rx's for nitrostat 0.4 mg,  Metformin XR 500 mg bid, spironolactone 25 mg qd and losartan 100 mg qd to [facility] please?

## 2020-07-26 NOTE — Telephone Encounter (Signed)
Thank You.

## 2020-08-02 ENCOUNTER — Telehealth: Payer: Self-pay | Admitting: Pharmacist

## 2020-08-02 NOTE — Telephone Encounter (Signed)
[employer] patient assistance requires a hard copy prescription. Clydie Braun, would you please print a prescription for Trelegy and ask one of the clinical ladies  to fax to [phone] please?  Application has been faxed.

## 2020-08-02 NOTE — Telephone Encounter (Signed)
Patient assistance applications faxed for [employer] and Jardiance. Will ask CPA to follow up in 14 days.

## 2020-08-03 MED ORDER — TRELEGY ELLIPTA 100-62.5-25 MCG/INH IN AEPB: 1.0000 | INHALATION_SPRAY | Freq: Every day | RESPIRATORY_TRACT | 11 refills | Status: AC

## 2020-08-03 NOTE — Telephone Encounter (Signed)
Faxed

## 2020-08-03 NOTE — Telephone Encounter (Cosign Needed)
Left voicemail to return call to ask patient to send in a 2022 co pay expense report from patients pharmacy.

## 2020-08-03 NOTE — Addendum Note (Signed)
Addended by: Adolphus Hanf on: 08/03/2020 01:51 PM   Modules accepted: Orders  

## 2020-08-03 NOTE — Addendum Note (Signed)
Addended by: Larae Grooms on: 08/03/2020 01:21 PM   Modules accepted: Orders

## 2020-08-16 ENCOUNTER — Other Ambulatory Visit: Payer: Self-pay | Admitting: Family Medicine

## 2020-08-16 NOTE — Telephone Encounter (Signed)
Future visit in 1 month  

## 2020-08-20 DIAGNOSIS — Z794 Long term (current) use of insulin: Secondary | ICD-10-CM | POA: Diagnosis not present

## 2020-08-20 DIAGNOSIS — E1122 Type 2 diabetes mellitus with diabetic chronic kidney disease: Secondary | ICD-10-CM | POA: Diagnosis not present

## 2020-08-20 DIAGNOSIS — I2510 Atherosclerotic heart disease of native coronary artery without angina pectoris: Secondary | ICD-10-CM | POA: Diagnosis not present

## 2020-08-20 DIAGNOSIS — I10 Essential (primary) hypertension: Secondary | ICD-10-CM | POA: Diagnosis not present

## 2020-08-20 DIAGNOSIS — J410 Simple chronic bronchitis: Secondary | ICD-10-CM | POA: Diagnosis not present

## 2020-08-20 DIAGNOSIS — I5020 Unspecified systolic (congestive) heart failure: Secondary | ICD-10-CM | POA: Diagnosis not present

## 2020-08-20 DIAGNOSIS — N1831 Chronic kidney disease, stage 3a: Secondary | ICD-10-CM | POA: Diagnosis not present

## 2020-08-20 DIAGNOSIS — E782 Mixed hyperlipidemia: Secondary | ICD-10-CM | POA: Diagnosis not present

## 2020-08-22 ENCOUNTER — Telehealth: Payer: Self-pay | Admitting: Pharmacist

## 2020-08-22 NOTE — Telephone Encounter (Signed)
Received [employer] Jardiance PAP denial requesting additional information. Refaxed with requested information attached.

## 2020-08-23 ENCOUNTER — Telehealth: Payer: Self-pay

## 2020-08-23 NOTE — Telephone Encounter (Signed)
Called patient, he did not answer. Let a message to see  if he has any issues with hearing or seeing ( does he have glasses/contacts). Will wait for response from patient to finish paperwork.

## 2020-08-24 ENCOUNTER — Telehealth: Payer: Self-pay | Admitting: Pharmacist

## 2020-08-24 NOTE — Chronic Care Management (AMB) (Signed)
Chronic Care Management Pharmacy Assistant   Name: Benjamin Chan  MRN: [id number] DOB: Aug 05, 1945   Reason for Encounter: Disease State Diabetes Mellitus    Recent office visits:  None noted  Recent consult visits:  08/20/20-Benjamin Chan (Cardiology) Routine follow up. Increase metoprolol to 50mg  daily. Follow up in 1 month.   Hospital visits:  None in previous 6 months  Medications: Outpatient Encounter Medications as of 08/24/2020  Medication Sig Note   acetaminophen (TYLENOL) 325 MG tablet Take 650 mg by mouth every 6 (six) hours as needed.    albuterol (VENTOLIN HFA) 108 (90 Base) MCG/ACT inhaler Inhale 2 puffs into the lungs every 6 (six) hours as needed for wheezing or shortness of breath.    aspirin EC 81 MG tablet Take 1 tablet (81 mg total) by mouth daily.    atorvastatin (LIPITOR) 80 MG tablet Take 1 tablet (80 mg total) by mouth daily.    azithromycin (ZITHROMAX) 250 MG tablet Take as directed (Patient not taking: Reported on 07/26/2020)    clopidogrel (PLAVIX) 75 MG tablet TAKE ONE TABLET BY MOUTH EVERY MORNING WITH BREAKFAST (Patient not taking: No sig reported)    Dulaglutide (TRULICITY) 0.75 MG/0.5ML SOPN Inject 0.75 mg into the skin once a week. Taking on Wednesdays    ezetimibe (ZETIA) 10 MG tablet Take 1 tablet (10 mg total) by mouth daily.    finasteride (PROSCAR) 5 MG tablet Take 1 tablet (5 mg total) by mouth daily.    fluticasone (FLONASE) 50 MCG/ACT nasal spray Place 1 spray into both nostrils 2 (two) times daily. Pt takes twice a day (Patient not taking: Reported on 07/25/2020)    Fluticasone-Umeclidin-Vilant (TRELEGY ELLIPTA) 100-62.5-25 MCG/INH AEPB Inhale 1 puff into the lungs daily.    furosemide (LASIX) 40 MG tablet Take 60 mg by mouth daily.    glipiZIDE (GLUCOTROL) 5 MG tablet Take 1 tablet (5 mg total) by mouth daily with breakfast.    Insulin Glargine (BASAGLAR KWIKPEN) 100 UNIT/ML Inject 40 Units into the skin in the morning. 07/26/2020:  Taking 40 units twice daily    Insulin Pen Needle (PEN NEEDLES 5/16") 30G X 8 MM MISC 1 each by Does not apply route daily.    JARDIANCE 25 MG TABS tablet TAKE ONE TABLET BY MOUTH ONCE DAILY    losartan (COZAAR) 100 MG tablet Take 1 tablet (100 mg total) by mouth daily.    metFORMIN (GLUCOPHAGE-XR) 500 MG 24 hr tablet Take 2 tablets (1,000 mg total) by mouth 2 (two) times daily.    metoprolol succinate (TOPROL-XL) 25 MG 24 hr tablet     Multiple Vitamin (MULTIVITAMIN) TABS TAKE ONE TABLET BY MOUTH ONCE DAILY    nitroGLYCERIN (NITROSTAT) 0.4 MG SL tablet Place 1 tablet (0.4 mg total) under the tongue every 5 (five) minutes as needed for chest pain.    OneTouch Delica Lancets 30G MISC 1 each by Does not apply route daily.    spironolactone (ALDACTONE) 25 MG tablet Take 1 tablet (25 mg total) by mouth daily.    No facility-administered encounter medications on file as of 08/24/2020.   Recent Relevant Labs: Lab Results  Component Value Date/Time   HGBA1C 8.9 (H) 07/04/2020 08:37 AM   HGBA1C 6.6 01/05/2020 03:28 PM   HGBA1C 7.5 (H) 11/01/2019 08:37 AM   HGBA1C 11.4 (H) 04/07/2018 10:24 AM    Kidney Function Lab Results  Component Value Date/Time   CREATININE 1.69 (H) 07/04/2020 08:37 AM   CREATININE 1.63 (H) 01/10/2020  11:13 AM   GFRNONAA 40 (L) 01/10/2020 11:13 AM   GFRAA 47 (L) 01/10/2020 11:13 AM    Current antihyperglycemic regimen:  Metformin xr 500 mg bid (self adjusted due to GI effects) Trulicity 0.75 mg q week ( did not start increased dosage) Jardiance 25 mg qd Glipizide 5 mg qd Basaglar 40 units BID  What recent interventions/DTPs have been made to improve glycemic control:  None noted Have there been any recent hospitalizations or ED visits since last visit with CPP? No Patient denies hypoglycemic symptoms, including Pale, Sweaty, Shaky, Hungry, Nervous/irritable, and Vision changes  Patient denies hyperglycemic symptoms, including blurry vision, excessive thirst,  fatigue, polyuria, and weakness  How often are you checking your blood sugar? once daily  What are your blood sugars ranging?  Fasting:  Before meals:  After meals:  Bedtime:  During the week, how often does your blood glucose drop below 70? Never  Are you checking your feet daily/regularly?         Patient's wife states he does check his feet daily.  Patients wife states the patient does weigh himself every day and following the cards, his weigh in today was 191 lbs. Patients wife states he has been taking his diabetic medications as prescribed. I have reminded patients wife his up coming appointment on 08/27/20 in person visit @12 :30 pm and is aware to bring in all of his medications and patient assistance applications for  , basaglar and Trulicity.  Adherence Review: Is the patient currently on a STATIN medication? Yes Is the patient currently on ACE/ARB medication? No Does the patient have >5 day gap between last estimated fill dates? Yes   Star Rating Drugs: Jardiance 25 mg Last filled:06/29/20 28 DS Metformin 500 mg Last filled:06/29/20 28 DS Losartan 100 mg Last filled:06/29/20 28 DS Trulicity 0.75 mg/0.5 mL Last filled: Glipizide 5 mg Last filled:06/29/20 28 DS Atorvastatin 80 mg Last filled:06/29/20 28 DS   Benjamin Chan 07/01/20, RMA [facility]cierge

## 2020-08-27 ENCOUNTER — Ambulatory Visit: Payer: Self-pay | Admitting: Pharmacist

## 2020-08-27 NOTE — Progress Notes (Incomplete)
Chronic Care Management Pharmacy Note  08/27/2020 Name:  Benjamin Chan MRN:  [id number] DOB:  [birth year]  Subjective: Benjamin Chan is an 75 y.o. year old male who is a primary patient of Jon Billings, NP.  The CCM team was consulted for assistance with disease management and care coordination needs.    Engaged with patient face to face for follow up visit in response to provider referral for pharmacy case management and/or care coordination services.   Consent to Services:  The patient was given information about Chronic Care Management services, agreed to services, and gave verbal consent prior to initiation of services.  Please see initial visit note for detailed documentation.   Patient Care Team: Jon Billings, NP as PCP - Abundio Miu, Junita Push, [facility] as Pharmacist (Pharmacist)  Recent office visits: 4/20/22[name], NP- (PCP)-blood work, albuterol inhaler rx  Recent consult visits: 07/09/20-[facility](cardiology HF clinic)- lasix changed to 1.5 tabs (60 mg) qd, return in 3 weeks, continue clopidogrel -[facility] visits: 10/11 - 10/13//22- [facility]- Dr. 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%, losartan 100 mg ,  amlodipine d/c, toprol Xl 25 mg qd, spironolactone 25 mg , continue plavix,  Objective:  Lab Results  Component Value Date   CREATININE 1.69 (H) 07/04/2020   BUN 24 07/04/2020   GFRNONAA 40 (L) 01/10/2020   GFRAA 47 (L) 01/10/2020   NA 138 07/04/2020   K 4.9 07/04/2020   CALCIUM 8.9 07/04/2020   CO2 21 07/04/2020   GLUCOSE 137 (H) 07/04/2020    Lab Results  Component Value Date/Time   HGBA1C 8.9 (H) 07/04/2020 08:37 AM   HGBA1C 6.6 01/05/2020 03:28 PM   HGBA1C 7.5 (H) 11/01/2019 08:37 AM   HGBA1C 11.4 (H) 04/07/2018 10:24 AM    Last diabetic Eye exam: No results found for: HMDIABEYEEXA  Last diabetic Foot exam: No results found for: HMDIABFOOTEX   Lab Results  Component Value Date   CHOL 116 07/04/2020   HDL 33 (L) 07/04/2020   LDLCALC  69 07/04/2020   TRIG 64 07/04/2020   CHOLHDL 3.5 07/04/2020    Hepatic Function Latest Ref Rng & Units 07/04/2020 01/05/2020 11/01/2019  Total Protein 6.0 - 8.5 g/dL 7.0 6.9 6.9  Albumin 3.7 - 4.7 g/dL 3.7 4.2 4.1  AST 0 - 40 IU/L $Remov'18 21 16  'FkJbjn$ ALT 0 - 44 IU/L $Remov'18 18 13  'DfMZBS$ Alk Phosphatase 44 - 121 IU/L 80 96 87  Total Bilirubin 0.0 - 1.2 mg/dL 0.3 0.3 0.5    Lab Results  Component Value Date/Time   TSH 0.363 06/29/2015 09:39 PM    CBC Latest Ref Rng & Units 01/05/2020 07/16/2019 07/14/2019  WBC 3.4 - 10.8 x10E3/uL 10.9(H) 15.6(H) 18.1(H)  Hemoglobin 13.0 - 17.7 g/dL 13.4 14.1 14.2  Hematocrit 37.5 - 51.0 % 39.6 41.8 42.1  Platelets 150 - 450 x10E3/uL 334 294 289    No results found for: VD25OH  Clinical ASCVD: Yes  The ASCVD Risk score Mikey Bussing DC Jr., et al., 2013) failed to calculate for the following reasons:   The patient has a prior MI or stroke diagnosis    Depression screen [facility] 2/9 01/10/2020 10/21/2019 07/25/2019  Decreased Interest 0 0 0  Down, Depressed, Hopeless 0 0 0  PHQ - 2 Score 0 0 0  Altered sleeping 0 1 -  Tired, decreased energy 0 0 -  Change in appetite 0 0 -  Feeling bad or failure about yourself  0 0 -  Trouble concentrating 0 0 -  Moving slowly or fidgety/restless 0 0 -  Suicidal thoughts 0 0 -  PHQ-9 Score 0 1 -  Difficult doing work/chores Not difficult at all Not difficult at all -       Social History   Tobacco Use  Smoking Status Former   Years: 40.00   Pack years: 0.00   Types: Cigarettes   Quit date: 2010   Years since quitting: 12.4  Smokeless Tobacco Never   BP Readings from Last 3 Encounters:  07/04/20 107/66  01/10/20 106/69  01/05/20 (!) 92/59   Pulse Readings from Last 3 Encounters:  07/04/20 74  01/10/20 87  01/05/20 84   Wt Readings from Last 3 Encounters:  01/10/20 190 lb (86.2 kg)  01/05/20 190 lb 3.2 oz (86.3 kg)  11/01/19 197 lb (89.4 kg)   BMI Readings from Last 3 Encounters:  01/10/20 30.67 kg/m  01/05/20 30.70  kg/m  11/01/19 31.80 kg/m    Assessment/Interventions: Review of patient past medical history, allergies, medications, health status, including review of consultants reports, laboratory and other test data, was performed as part of comprehensive evaluation and provision of chronic care management services.   SDOH:  (Social Determinants of Health) assessments and interventions performed: No  SDOH Screenings   Alcohol Screen: Not on file  Depression (PHQ2-9): Low Risk    PHQ-2 Score: 0  Financial Resource Strain: Low Risk    Difficulty of Paying Living Expenses: Not hard at all  Food Insecurity: No Food Insecurity   Worried About Charity fundraiser in the Last Year: Never true   Ran Out of Food in the Last Year: Never true  Housing: Not on file  Physical Activity: Inactive   Days of Exercise per Week: 0 days   Minutes of Exercise per Session: 0 min  Social Connections: Not on file  Stress: No Stress Concern Present   Feeling of Stress : Not at all  Tobacco Use: Medium Risk   Smoking Tobacco Use: Former   Smokeless Tobacco Use: Never  Transportation Needs: No Transportation Needs   Lack of Transportation (Medical): No   Lack of Transportation (Non-Medical): No      Immunization History  Administered Date(s) Administered   Influenza Inj Mdck Quad Pf 12/28/2018   Influenza, High Dose Seasonal PF 12/31/2016, 11/23/2017   Influenza, Seasonal, Injecte, Preservative Fre 01/22/2006, 01/10/2008, 02/05/2009, 02/19/2010, 03/28/2011, 12/25/2011   Influenza,inj,Quad PF,6+ Mos 12/13/2013, 11/21/2014, 01/25/2016, 12/28/2019   Influenza-Unspecified 02/15/2013, 12/31/2016, 12/28/2018   Moderna Sars-Covid-2 Vaccination 04/21/2019, 05/19/2019, 03/18/2020   Pneumococcal Conjugate-13 08/15/2014   Pneumococcal Polysaccharide-23 05/02/2003, 02/19/2010    Conditions to be addressed/monitored:  Hypertension, Hyperlipidemia, Diabetes, Heart Failure, Coronary Artery Disease, COPD, Chronic  Kidney Disease and BPH  There are no care plans that you recently modified to display for this patient.    Medication Assistance: Application for Trulicity, basaglar, Trelegy and jaridance  medication assistance program. in process.  Anticipated assistance start date 08/15/20.  See plan of care for additional detail.  Printed all new applications today.  Patient's preferred pharmacy is:  [place], [place] - [address] [place] [address] Phone: [phone] Fax: [phone]  Uses pill box? No - pill packaging at [facility] Pt endorses 80% compliance  We discussed: Current pharmacy is preferred with insurance plan and patient is satisfied with pharmacy services Patient decided to: Continue current medication management strategy  Care Plan and Follow Up Patient Decision:  Patient agrees to  Care Plan and Follow-up.  Plan: Telephone follow up appointment with care management team member scheduled for:  2-4 weeks CPA. PharmD 6-8 weeks  Junita Push. Kenton Kingfisher PharmD, [place] [facility] [phone]

## 2020-08-27 NOTE — Telephone Encounter (Signed)
Ready for signature

## 2020-08-27 NOTE — Telephone Encounter (Signed)
Form faxed

## 2020-08-27 NOTE — Telephone Encounter (Signed)
Form signed.

## 2020-09-12 ENCOUNTER — Telehealth: Payer: Self-pay | Admitting: Pharmacist

## 2020-09-12 NOTE — Chronic Care Management (AMB) (Signed)
    Chronic Care Management Pharmacy Assistant   Name: LATRON RIBAS  MRN: [id number] DOB: [birth year]   Reason for Encounter: Chart Review  Medications: Outpatient Encounter Medications as of 09/12/2020  Medication Sig Note   acetaminophen (TYLENOL) 325 MG tablet Take 650 mg by mouth every 6 (six) hours as needed.    albuterol (VENTOLIN HFA) 108 (90 Base) MCG/ACT inhaler Inhale 2 puffs into the lungs every 6 (six) hours as needed for wheezing or shortness of breath.    aspirin EC 81 MG tablet Take 1 tablet (81 mg total) by mouth daily.    atorvastatin (LIPITOR) 80 MG tablet Take 1 tablet (80 mg total) by mouth daily.    azithromycin (ZITHROMAX) 250 MG tablet Take as directed (Patient not taking: Reported on 07/26/2020)    clopidogrel (PLAVIX) 75 MG tablet TAKE ONE TABLET BY MOUTH EVERY MORNING WITH BREAKFAST (Patient not taking: No sig reported)    Dulaglutide (TRULICITY) 0.75 MG/0.5ML SOPN Inject 0.75 mg into the skin once a week. Taking on Wednesdays    ezetimibe (ZETIA) 10 MG tablet Take 1 tablet (10 mg total) by mouth daily.    finasteride (PROSCAR) 5 MG tablet Take 1 tablet (5 mg total) by mouth daily.    fluticasone (FLONASE) 50 MCG/ACT nasal spray Place 1 spray into both nostrils 2 (two) times daily. Pt takes twice a day (Patient not taking: Reported on 07/25/2020)    Fluticasone-Umeclidin-Vilant (TRELEGY ELLIPTA) 100-62.5-25 MCG/INH AEPB Inhale 1 puff into the lungs daily.    furosemide (LASIX) 40 MG tablet Take 60 mg by mouth daily.    glipiZIDE (GLUCOTROL) 5 MG tablet Take 1 tablet (5 mg total) by mouth daily with breakfast.    Insulin Glargine (BASAGLAR KWIKPEN) 100 UNIT/ML Inject 40 Units into the skin in the morning. 07/26/2020: Taking 40 units twice daily    Insulin Pen Needle (PEN NEEDLES 5/16") 30G X 8 MM MISC 1 each by Does not apply route daily.    JARDIANCE 25 MG TABS tablet TAKE ONE TABLET BY MOUTH ONCE DAILY    losartan (COZAAR) 100 MG tablet Take 1 tablet (100 mg  total) by mouth daily.    metFORMIN (GLUCOPHAGE-XR) 500 MG 24 hr tablet Take 2 tablets (1,000 mg total) by mouth 2 (two) times daily.    metoprolol succinate (TOPROL-XL) 25 MG 24 hr tablet     metoprolol succinate (TOPROL-XL) 50 MG 24 hr tablet Take by mouth.    Multiple Vitamin (MULTIVITAMIN) TABS TAKE ONE TABLET BY MOUTH ONCE DAILY    nitroGLYCERIN (NITROSTAT) 0.4 MG SL tablet Place 1 tablet (0.4 mg total) under the tongue every 5 (five) minutes as needed for chest pain.    OneTouch Delica Lancets 30G MISC 1 each by Does not apply route daily.    spironolactone (ALDACTONE) 25 MG tablet Take 1 tablet (25 mg total) by mouth daily.    No facility-administered encounter medications on file as of 09/12/2020.    Reviewed chart for medication changes and adherence.  No OVs, Consults, or hospital visits since last care coordination call / Pharmacist visit. No medication changes indicated  No gaps in adherence identified. Patient has follow up scheduled with pharmacy team. No further action required.  Lura Em Clinical Pharmacist Assistant [phone]

## 2020-09-15 ENCOUNTER — Other Ambulatory Visit: Payer: Self-pay | Admitting: Family Medicine

## 2020-09-15 NOTE — Telephone Encounter (Signed)
Requested medication (s) are due for refill today: yes  Requested medication (s) are on the active medication list: yes  Last refill:  04/03/20 #90 1 RF  Future visit scheduled: yes  Notes to clinic:  overdue lab work   Requested Prescriptions  Pending Prescriptions Disp Refills   clopidogrel (PLAVIX) 75 MG tablet [Pharmacy Med Name: clopidogrel 75 mg tablet] 90 tablet 1    Sig: TAKE ONE TABLET BY MOUTH EVERY MORNING      Hematology: Antiplatelets - clopidogrel Failed - 09/15/2020 12:16 PM      Failed - Evaluate AST, ALT within 2 months of therapy initiation.      Failed - HCT in normal range and within 180 days    Hematocrit  Date Value Ref Range Status  01/05/2020 39.6 37.5 - 51.0 % Final          Failed - HGB in normal range and within 180 days    Hemoglobin  Date Value Ref Range Status  01/05/2020 13.4 13.0 - 17.7 g/dL Final          Failed - PLT in normal range and within 180 days    Platelets  Date Value Ref Range Status  01/05/2020 334 150 - 450 x10E3/uL Final          Passed - ALT in normal range and within 360 days    ALT  Date Value Ref Range Status  07/04/2020 18 0 - 44 IU/L Final          Passed - AST in normal range and within 360 days    AST  Date Value Ref Range Status  07/04/2020 18 0 - 40 IU/L Final          Passed - Valid encounter within last 6 months    Recent Outpatient Visits           2 months ago Hypertension associated with diabetes (HCC)   [facility] Larae Grooms, NP   8 months ago Benign hypertensive renal disease   [name] Avis, Megan P, DO   8 months ago Chronic congestive heart failure, unspecified heart failure type [facility])   [name] Johnson, Megan P, DO   10 months ago DM (diabetes mellitus), type 2, uncontrolled, with renal complications [facility])   [name] [place], [place], PA-C   1 year ago DM (diabetes mellitus), type 2, uncontrolled, with renal  complications [facility])   [name] [name], [place]       Future Appointments             In 2 weeks Larae Grooms, NP [facility], [facility]   In 1 month  [facility], [facility]

## 2020-09-18 ENCOUNTER — Other Ambulatory Visit: Payer: Self-pay

## 2020-09-18 MED ORDER — FLUTICASONE PROPIONATE 50 MCG/ACT NA SUSP: 1.0000 | Freq: Two times a day (BID) | NASAL | 2 refills | Status: DC

## 2020-09-18 MED ORDER — ALBUTEROL SULFATE HFA 108 (90 BASE) MCG/ACT IN AERS: 2.0000 | INHALATION_SPRAY | Freq: Four times a day (QID) | RESPIRATORY_TRACT | 2 refills | Status: DC | PRN

## 2020-09-18 NOTE — Telephone Encounter (Signed)
Pt is scheduled 7/20

## 2020-10-02 NOTE — Progress Notes (Signed)
BP (!) 99/59   Pulse 72   Temp 98.5 F (36.9 C)   Wt 194 lb 4 oz (88.1 kg)   SpO2 91%   BMI 31.35 kg/m    Subjective:    Patient ID: Benjamin Chan, male    DOB: 1946-02-08, 75 y.o.   MRN: [id number]  HPI: Benjamin Chan is a 75 y.o. male  Chief Complaint  Patient presents with   Hypertension    Patient states that his heart doctor increased his BP medication at his last visit   Hyperlipidemia   Diabetes   other    Patient states that he is not taking anything other than the medication that is checked as taking.   HYPERTENSION / HYPERLIPIDEMIA Patient's wife states that his blood pressure medication was increased but she is not sure which one.  She states he is more tired than he was before it was increased. They have not followed up with Cardiology since June. Satisfied with current treatment? no Duration of hypertension: years BP monitoring frequency: rarely BP range:  BP medication side effects: no Past BP meds:  metoprolol and lasix and losartan (cozaar) Duration of hyperlipidemia: years Cholesterol medication side effects: no Cholesterol supplements: none Past cholesterol medications: atorvastain (lipitor) and ezetimide (zetia) Medication compliance: excellent compliance Aspirin: no Recent stressors: no Recurrent headaches: no Visual changes: no Palpitations: no Dyspnea: no Chest pain: no Lower extremity edema: no Dizzy/lightheaded: no  DIABETES Hypoglycemic episodes:no Polydipsia/polyuria: no Visual disturbance: no Chest pain: no Paresthesias: no Glucose Monitoring: yes  Accucheck frequency: Daily  Fasting glucose: 184  Post prandial:  Evening:  Before meals: Taking Insulin?: yes  Long acting insulin:  Short acting insulin: Blood Pressure Monitoring: rarely Retinal Examination: Not up to Date Foot Exam: Not up to Date Diabetic Education: Not Completed Pneumovax: Up to Date Influenza: Up to Date Aspirin: no   CHRONIC KIDNEY  DISEASE CKD status: controlled Medications renally dose: yes Previous renal evaluation: no Pneumovax:  Up to Date Influenza Vaccine:  Up to Date    Relevant past medical, surgical, family and social history reviewed and updated as indicated. Interim medical history since our last visit reviewed. Allergies and medications reviewed and updated.  Review of Systems  Constitutional:  Positive for fatigue.  Eyes:  Negative for visual disturbance.  Respiratory:  Negative for chest tightness and shortness of breath.   Cardiovascular:  Negative for chest pain, palpitations and leg swelling.  Endocrine: Negative for polydipsia and polyuria.  Neurological:  Negative for dizziness, light-headedness, numbness and headaches.   Per HPI unless specifically indicated above     Objective:    BP (!) 99/59   Pulse 72   Temp 98.5 F (36.9 C)   Wt 194 lb 4 oz (88.1 kg)   SpO2 91%   BMI 31.35 kg/m   Wt Readings from Last 3 Encounters:  10/03/20 194 lb 4 oz (88.1 kg)  01/10/20 190 lb (86.2 kg)  01/05/20 190 lb 3.2 oz (86.3 kg)    Physical Exam Vitals and nursing note reviewed.  Constitutional:      General: He is not in acute distress.    Appearance: Normal appearance. He is not ill-appearing, toxic-appearing or diaphoretic.  HENT:     Head: Normocephalic.     Right Ear: External ear normal.     Left Ear: External ear normal.     Nose: Nose normal. No congestion or rhinorrhea.     Mouth/Throat:     Mouth: Mucous membranes  are moist.  Eyes:     General:        Right eye: No discharge.        Left eye: No discharge.     Extraocular Movements: Extraocular movements intact.     Conjunctiva/sclera: Conjunctivae normal.     Pupils: Pupils are equal, round, and reactive to light.  Cardiovascular:     Rate and Rhythm: Normal rate and regular rhythm.     Heart sounds: No murmur heard. Pulmonary:     Effort: Pulmonary effort is normal. No respiratory distress.     Breath sounds: Decreased  breath sounds present. No wheezing, rhonchi or rales.  Abdominal:     General: Abdomen is flat. Bowel sounds are normal.  Musculoskeletal:     Cervical back: Normal range of motion and neck supple.  Skin:    General: Skin is warm and dry.     Capillary Refill: Capillary refill takes less than 2 seconds.  Neurological:     General: No focal deficit present.     Mental Status: He is alert and oriented to person, place, and time.  Psychiatric:        Mood and Affect: Mood normal.        Behavior: Behavior normal.        Thought Content: Thought content normal.        Judgment: Judgment normal.    Results for orders placed or performed in visit on 07/04/20  Lipid panel  Result Value Ref Range   Cholesterol, Total 116 100 - 199 mg/dL   Triglycerides 64 0 - 149 mg/dL   HDL 33 (L) >39 mg/dL   VLDL Cholesterol Cal 14 5 - 40 mg/dL   LDL Chol Calc (NIH) 69 0 - 99 mg/dL   Chol/HDL Ratio 3.5 0.0 - 5.0 ratio  Vitamin B12  Result Value Ref Range   Vitamin B-12 240 232 - 1,245 pg/mL  HgB A1c  Result Value Ref Range   Hgb A1c MFr Bld 8.9 (H) 4.8 - 5.6 %   Est. average glucose Bld gHb Est-mCnc 209 mg/dL  Comp Met (CMET)  Result Value Ref Range   Glucose 137 (H) 65 - 99 mg/dL   BUN 24 8 - 27 mg/dL   Creatinine, Ser 1.69 (H) 0.76 - 1.27 mg/dL   eGFR 42 (L) >59 mL/min/1.73   BUN/Creatinine Ratio 14 10 - 24   Sodium 138 134 - 144 mmol/L   Potassium 4.9 3.5 - 5.2 mmol/L   Chloride 100 96 - 106 mmol/L   CO2 21 20 - 29 mmol/L   Calcium 8.9 8.6 - 10.2 mg/dL   Total Protein 7.0 6.0 - 8.5 g/dL   Albumin 3.7 3.7 - 4.7 g/dL   Globulin, Total 3.3 1.5 - 4.5 g/dL   Albumin/Globulin Ratio 1.1 (L) 1.2 - 2.2   Bilirubin Total 0.3 0.0 - 1.2 mg/dL   Alkaline Phosphatase 80 44 - 121 IU/L   AST 18 0 - 40 IU/L   ALT 18 0 - 44 IU/L      Assessment & Plan:   Problem List Items Addressed This Visit       Cardiovascular and Mediastinum   Hypertension associated with diabetes (HCC)    Chronic.   Improved from previous visit. Patient encouraged to follow up with Cardiology.        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  Controlled on current regimen. Due for follow  up with Cardiology.  Information for Cardiologist given to patient during visit again.  Encouraged to make appointment.  Follow up in 3 months.  Labs drawn today.         Chronic congestive heart failure (HCC)    Chronic.  Controlled on current regimen. Due for follow up with Cardiology.  Information for Cardiologist given to patient during visit again.  Encouraged to make appointment.  Follow up in 3 months.  Labs drawn today.         Relevant Orders   CBC w/Diff   HFrEF (heart failure with reduced ejection fraction) (HCC)    Chronic.  Controlled on current regimen. Due for follow up with Cardiology.  Information for Cardiologist given to patient during visit again.  Encouraged to make appointment.  Follow up in 3 months.  Labs drawn today.         Aortic atherosclerosis (HCC)    Chronic.  Controlled on current regimen.  Patient was supposed to follow up with Cardiology in July.  They are unsure of the phone number for Cardiology.  Information for Cardiologist given to patient during visit.  Encouraged to make appointment.  Follow up in 3 months.  Labs drawn today.  Patient unsure of what medication was adjusted by Cardiology.         Relevant Orders   CBC w/Diff     Respiratory   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 classification (HCC)    Chronic. Controlled on current medication regimen.  Recommend patient continue with current medications. Follow up in 3 months.         Endocrine   DM (diabetes mellitus), type 2, uncontrolled, with renal complications (HCC)    Chronic.  Sugars are running high in the 180s fasting.  Patient states he is taking all of his medications.  Will draw labs today and make recommendations based on lab results.        Relevant Orders    Lipid Profile   HgB A1c   Comp Met (CMET)   CKD stage 3 due to type 2 diabetes mellitus (HCC)    Chronic.  Controlled.  Continue with current medication regimen.  Labs ordered today.  Return to clinic in 3 months for reevaluation.  Call sooner if concerns arise.         Relevant Orders   Comp Met (CMET)   CBC w/Diff     Other   Hyperlipidemia    Chronic.  Controlled.  Continue with current medication regimen.  Labs ordered today.  Return to clinic in 3 months for reevaluation.  Call sooner if concerns arise.         Relevant Orders   Lipid Profile     Follow up plan: Return in about 3 months (around 01/03/2021) for HTN, HLD, DM2 FU.

## 2020-10-03 ENCOUNTER — Other Ambulatory Visit: Payer: Self-pay

## 2020-10-03 ENCOUNTER — Encounter: Payer: Self-pay | Admitting: Nurse Practitioner

## 2020-10-03 ENCOUNTER — Ambulatory Visit (INDEPENDENT_AMBULATORY_CARE_PROVIDER_SITE_OTHER): Payer: Medicare HMO | Admitting: Nurse Practitioner

## 2020-10-03 VITALS — BP 99/59 | HR 72 | Temp 98.5°F | Wt 194.2 lb

## 2020-10-03 DIAGNOSIS — E785 Hyperlipidemia, unspecified: Secondary | ICD-10-CM | POA: Diagnosis not present

## 2020-10-03 DIAGNOSIS — I5020 Unspecified systolic (congestive) heart failure: Secondary | ICD-10-CM

## 2020-10-03 DIAGNOSIS — I509 Heart failure, unspecified: Secondary | ICD-10-CM

## 2020-10-03 DIAGNOSIS — E1129 Type 2 diabetes mellitus with other diabetic kidney complication: Secondary | ICD-10-CM

## 2020-10-03 DIAGNOSIS — E1122 Type 2 diabetes mellitus with diabetic chronic kidney disease: Secondary | ICD-10-CM

## 2020-10-03 DIAGNOSIS — J449 Chronic obstructive pulmonary disease, unspecified: Secondary | ICD-10-CM | POA: Diagnosis not present

## 2020-10-03 DIAGNOSIS — N183 Chronic kidney disease, stage 3 unspecified: Secondary | ICD-10-CM

## 2020-10-03 DIAGNOSIS — I25810 Atherosclerosis of coronary artery bypass graft(s) without angina pectoris: Secondary | ICD-10-CM

## 2020-10-03 DIAGNOSIS — D72829 Elevated white blood cell count, unspecified: Secondary | ICD-10-CM

## 2020-10-03 DIAGNOSIS — I152 Hypertension secondary to endocrine disorders: Secondary | ICD-10-CM

## 2020-10-03 DIAGNOSIS — E1165 Type 2 diabetes mellitus with hyperglycemia: Secondary | ICD-10-CM | POA: Diagnosis not present

## 2020-10-03 DIAGNOSIS — E1159 Type 2 diabetes mellitus with other circulatory complications: Secondary | ICD-10-CM

## 2020-10-03 DIAGNOSIS — I700 Atherosclerosis of aorta: Secondary | ICD-10-CM | POA: Diagnosis not present

## 2020-10-03 DIAGNOSIS — IMO0002 Reserved for concepts with insufficient information to code with codable children: Secondary | ICD-10-CM

## 2020-10-03 DIAGNOSIS — E1142 Type 2 diabetes mellitus with diabetic polyneuropathy: Secondary | ICD-10-CM

## 2020-10-03 NOTE — Assessment & Plan Note (Signed)
Chronic.  Controlled on current regimen.  Patient was supposed to follow up with Cardiology in July.  They are unsure of the phone number for Cardiology.  Information for Cardiologist given to patient during visit.  Encouraged to make appointment.  Follow up in 3 months.  Labs drawn today.  Patient unsure of what medication was adjusted by Cardiology.

## 2020-10-03 NOTE — Assessment & Plan Note (Signed)
Chronic.  Improved from previous visit. Patient encouraged to follow up with Cardiology.

## 2020-10-03 NOTE — Assessment & Plan Note (Signed)
Chronic. Controlled on current medication regimen.  Recommend patient continue with current medications. Follow up in 3 months.

## 2020-10-03 NOTE — Assessment & Plan Note (Signed)
Chronic.  Controlled on current regimen. Due for follow up with Cardiology.  Information for Cardiologist given to patient during visit again.  Encouraged to make appointment.  Follow up in 3 months.  Labs drawn today.   

## 2020-10-03 NOTE — Assessment & Plan Note (Signed)
Chronic.  Controlled.  Continue with current medication regimen.  Labs ordered today.  Return to clinic in 3 months for reevaluation.  Call sooner if concerns arise.   

## 2020-10-03 NOTE — Assessment & Plan Note (Signed)
Chronic.  Sugars are running high in the 180s fasting.  Patient states he is taking all of his medications.  Will draw labs today and make recommendations based on lab results.

## 2020-10-03 NOTE — Assessment & Plan Note (Signed)
Chronic.  Controlled on current regimen. Due for follow up with Cardiology.  Information for Cardiologist given to patient during visit again.  Encouraged to make appointment.  Follow up in 3 months.  Labs drawn today.

## 2020-10-04 LAB — LIPID PANEL
Chol/HDL Ratio: 4.3 ratio (ref 0.0–5.0)
Cholesterol, Total: 153 mg/dL (ref 100–199)
HDL: 36 mg/dL — ABNORMAL LOW (ref >39)
LDL Chol Calc (NIH): 91 mg/dL (ref 0–99)
Triglycerides: 144 mg/dL (ref 0–149)
VLDL Cholesterol Cal: 26 mg/dL (ref 5–40)

## 2020-10-04 LAB — COMPREHENSIVE METABOLIC PANEL
ALT: 17 IU/L (ref 0–44)
AST: 17 IU/L (ref 0–40)
Albumin/Globulin Ratio: 1.4 (ref 1.2–2.2)
Albumin: 4.1 g/dL (ref 3.7–4.7)
Alkaline Phosphatase: 99 IU/L (ref 44–121)
BUN/Creatinine Ratio: 18 (ref 10–24)
BUN: 36 mg/dL — ABNORMAL HIGH (ref 8–27)
Bilirubin Total: 0.5 mg/dL (ref 0.0–1.2)
CO2: 23 mmol/L (ref 20–29)
Calcium: 9.4 mg/dL (ref 8.6–10.2)
Chloride: 100 mmol/L (ref 96–106)
Creatinine, Ser: 2.05 mg/dL — ABNORMAL HIGH (ref 0.76–1.27)
Globulin, Total: 2.9 g/dL (ref 1.5–4.5)
Glucose: 112 mg/dL — ABNORMAL HIGH (ref 65–99)
Potassium: 4.2 mmol/L (ref 3.5–5.2)
Sodium: 140 mmol/L (ref 134–144)
Total Protein: 7 g/dL (ref 6.0–8.5)
eGFR: 33 mL/min/{1.73_m2} — ABNORMAL LOW (ref >59)

## 2020-10-04 LAB — CBC WITH DIFFERENTIAL/PLATELET
Basophils Absolute: 0.1 10*3/uL (ref 0.0–0.2)
Basos: 1 %
EOS (ABSOLUTE): 0.1 10*3/uL (ref 0.0–0.4)
Eos: 1 %
Hematocrit: 40.7 % (ref 37.5–51.0)
Hemoglobin: 14.1 g/dL (ref 13.0–17.7)
Immature Grans (Abs): 0 10*3/uL (ref 0.0–0.1)
Immature Granulocytes: 0 %
Lymphocytes Absolute: 2.5 10*3/uL (ref 0.7–3.1)
Lymphs: 19 %
MCH: 29.7 pg (ref 26.6–33.0)
MCHC: 34.6 g/dL (ref 31.5–35.7)
MCV: 86 fL (ref 79–97)
Monocytes Absolute: 1 10*3/uL — ABNORMAL HIGH (ref 0.1–0.9)
Monocytes: 7 %
Neutrophils Absolute: 9.2 10*3/uL — ABNORMAL HIGH (ref 1.4–7.0)
Neutrophils: 72 %
Platelets: 289 10*3/uL (ref 150–450)
RBC: 4.74 x10E6/uL (ref 4.14–5.80)
RDW: 12.4 % (ref 11.6–15.4)
WBC: 12.8 10*3/uL — ABNORMAL HIGH (ref 3.4–10.8)

## 2020-10-04 LAB — HEMOGLOBIN A1C
Est. average glucose Bld gHb Est-mCnc: 177 mg/dL
Hgb A1c MFr Bld: 7.8 % — ABNORMAL HIGH (ref 4.8–5.6)

## 2020-10-04 NOTE — Progress Notes (Signed)
Please let patient know that his lab work shows that his cholesterol is well controlled. Hi A1c improved from 8.9 to 7.8. I recommend he increase his trulicity to 1.5mg . If he still has some of the medication he can use two of the pens. I will send in the increased dose to the pharmacy.   His blood work also shows that his kidney function has declined some. I would like him to come back and leave a urine sample to rule out UTI because his white blood count is also elevated. I also placed an order for him to repeat his kidney function.

## 2020-10-04 NOTE — Addendum Note (Signed)
Addended by: Larae Grooms on: 10/04/2020 12:33 PM   Modules accepted: Orders

## 2020-10-05 ENCOUNTER — Other Ambulatory Visit: Payer: Medicare HMO

## 2020-10-05 DIAGNOSIS — D72829 Elevated white blood cell count, unspecified: Secondary | ICD-10-CM

## 2020-10-05 DIAGNOSIS — E1122 Type 2 diabetes mellitus with diabetic chronic kidney disease: Secondary | ICD-10-CM | POA: Diagnosis not present

## 2020-10-05 DIAGNOSIS — N183 Chronic kidney disease, stage 3 unspecified: Secondary | ICD-10-CM | POA: Diagnosis not present

## 2020-10-05 LAB — URINALYSIS, ROUTINE W REFLEX MICROSCOPIC
Bilirubin, UA: NEGATIVE
Ketones, UA: NEGATIVE
Leukocytes,UA: NEGATIVE
Nitrite, UA: NEGATIVE
Protein,UA: NEGATIVE
Specific Gravity, UA: 1.015 (ref 1.005–1.030)
Urobilinogen, Ur: 1 mg/dL (ref 0.2–1.0)
pH, UA: 6 (ref 5.0–7.5)

## 2020-10-05 LAB — MICROSCOPIC EXAMINATION
Bacteria, UA: NONE SEEN
Epithelial Cells (non renal): NONE SEEN /hpf (ref 0–10)
WBC, UA: NONE SEEN /hpf (ref 0–5)

## 2020-10-06 LAB — COMPREHENSIVE METABOLIC PANEL
ALT: 15 IU/L (ref 0–44)
AST: 18 IU/L (ref 0–40)
Albumin/Globulin Ratio: 1.7 (ref 1.2–2.2)
Albumin: 4.5 g/dL (ref 3.7–4.7)
Alkaline Phosphatase: 102 IU/L (ref 44–121)
BUN/Creatinine Ratio: 15 (ref 10–24)
BUN: 32 mg/dL — ABNORMAL HIGH (ref 8–27)
Bilirubin Total: 0.5 mg/dL (ref 0.0–1.2)
CO2: 22 mmol/L (ref 20–29)
Calcium: 9.7 mg/dL (ref 8.6–10.2)
Chloride: 98 mmol/L (ref 96–106)
Creatinine, Ser: 2.12 mg/dL — ABNORMAL HIGH (ref 0.76–1.27)
Globulin, Total: 2.7 g/dL (ref 1.5–4.5)
Glucose: 214 mg/dL — ABNORMAL HIGH (ref 65–99)
Potassium: 4.2 mmol/L (ref 3.5–5.2)
Sodium: 138 mmol/L (ref 134–144)
Total Protein: 7.2 g/dL (ref 6.0–8.5)
eGFR: 32 mL/min/{1.73_m2} — ABNORMAL LOW (ref >59)

## 2020-10-08 NOTE — Progress Notes (Signed)
Please let patient know that his kidney function has continued to worsen.  I have placed a referral for him to see Nephrology so we can find out why.  Please let me know if patient has nay questions.

## 2020-10-10 ENCOUNTER — Other Ambulatory Visit: Payer: Self-pay | Admitting: Family Medicine

## 2020-10-22 ENCOUNTER — Ambulatory Visit (INDEPENDENT_AMBULATORY_CARE_PROVIDER_SITE_OTHER): Payer: Medicare HMO

## 2020-10-22 VITALS — Ht 66.0 in | Wt 198.0 lb

## 2020-10-22 DIAGNOSIS — Z0000 Encounter for general adult medical examination without abnormal findings: Secondary | ICD-10-CM

## 2020-10-22 NOTE — Progress Notes (Signed)
I connected with Benjamin Chan today by telephone and verified that I am speaking with the correct person using two identifiers. Location patient: home Location provider: work Persons participating in the virtual visit: Benjamin Chan, Elisha Ponder LPN.   I discussed the limitations, risks, security and privacy concerns of performing an evaluation and management service by telephone and the availability of in person appointments. I also discussed with the patient that there may be a patient responsible charge related to this service. The patient expressed understanding and verbally consented to this telephonic visit.    Interactive audio and video telecommunications were attempted between this provider and patient, however failed, due to patient having technical difficulties OR patient did not have access to video capability.  We continued and completed visit with audio only.     Vital signs may be patient reported or missing.  Subjective:   Benjamin Chan is a 75 y.o. male who presents for Medicare Annual/Subsequent preventive examination.  Review of Systems     Cardiac Risk Factors include: advanced age (>46men, >60 women);diabetes mellitus;hypertension;male gender;obesity (BMI >30kg/m2);sedentary lifestyle     Objective:    Today's Vitals   10/22/20 0812  Weight: 198 lb (89.8 kg)  Height: 5\' 6"  (1.676 m)   Body mass index is 31.96 kg/m.  Advanced Directives 10/22/2020 10/21/2019 10/04/2019 07/14/2019 05/02/2019 07/19/2018 07/10/2017  Does Patient Have a Medical Advance Directive? No No No No Yes No No  Type of Advance Directive - - - - 07/12/2017;Living will - -  Does patient want to make changes to medical advance directive? - - - - No - Patient declined - -  Copy of Healthcare Power of Attorney in Chart? - - - - No - copy requested - -  Would patient like information on creating a medical advance directive? - - No - Patient declined No - Guardian declined  - Yes (MAU/Ambulatory/Procedural Areas - Information given) No - Patient declined    Current Medications (verified) Outpatient Encounter Medications as of 10/22/2020  Medication Sig   acetaminophen (TYLENOL) 325 MG tablet Take 650 mg by mouth every 6 (six) hours as needed.   albuterol (VENTOLIN HFA) 108 (90 Base) MCG/ACT inhaler Inhale 2 puffs into the lungs every 6 (six) hours as needed for wheezing or shortness of breath.   aspirin EC 81 MG tablet Take 1 tablet (81 mg total) by mouth daily.   atorvastatin (LIPITOR) 80 MG tablet TAKE ONE TABLET BY MOUTH AT BEDTIME   clopidogrel (PLAVIX) 75 MG tablet TAKE ONE TABLET BY MOUTH EVERY MORNING   Dulaglutide (TRULICITY) 0.75 MG/0.5ML SOPN Inject 0.75 mg into the skin once a week. Taking on Wednesdays   ezetimibe (ZETIA) 10 MG tablet Take 1 tablet (10 mg total) by mouth daily.   finasteride (PROSCAR) 5 MG tablet Take 1 tablet (5 mg total) by mouth daily.   fluticasone (FLONASE) 50 MCG/ACT nasal spray Place 1 spray into both nostrils 2 (two) times daily. Pt takes twice a day   Fluticasone-Umeclidin-Vilant (TRELEGY ELLIPTA) 100-62.5-25 MCG/INH AEPB Inhale 1 puff into the lungs daily.   furosemide (LASIX) 40 MG tablet Take 60 mg by mouth daily.   glipiZIDE (GLUCOTROL) 5 MG tablet TAKE ONE TABLET BY MOUTH EVERY MORNING   Insulin Glargine (BASAGLAR KWIKPEN) 100 UNIT/ML Inject 40 Units into the skin in the morning.   Insulin Pen Needle (PEN NEEDLES 5/16") 30G X 8 MM MISC 1 each by Does not apply route daily.   JARDIANCE 25 MG  TABS tablet TAKE ONE TABLET BY MOUTH ONCE DAILY   losartan (COZAAR) 100 MG tablet Take 1 tablet (100 mg total) by mouth daily.   metFORMIN (GLUCOPHAGE-XR) 500 MG 24 hr tablet Take 2 tablets (1,000 mg total) by mouth 2 (two) times daily.   metoprolol succinate (TOPROL-XL) 25 MG 24 hr tablet    metoprolol succinate (TOPROL-XL) 50 MG 24 hr tablet Take by mouth.   Multiple Vitamin (MULTIVITAMIN) TABS TAKE ONE TABLET BY MOUTH ONCE DAILY    nitroGLYCERIN (NITROSTAT) 0.4 MG SL tablet Place 1 tablet (0.4 mg total) under the tongue every 5 (five) minutes as needed for chest pain.   OneTouch Delica Lancets 30G MISC 1 each by Does not apply route daily.   spironolactone (ALDACTONE) 25 MG tablet Take 1 tablet (25 mg total) by mouth daily.   No facility-administered encounter medications on file as of 10/22/2020.    Allergies (verified) Brilinta [ticagrelor]   History: Past Medical History:  Diagnosis Date   Arthritis    shoulder   BPH (benign prostatic hyperplasia)    CAD (coronary artery disease)    Chronic kidney disease    COPD (chronic obstructive pulmonary disease) (HCC)    Diabetes mellitus without complication (HCC)    Diabetic ketoacidosis without coma associated with type 2 diabetes mellitus (HCC)    Heart attack (HCC) 06/2019   Hypertension    MI (myocardial infarction) (HCC) 03/2017   Non-ST elevation myocardial infarction (NSTEMI), subendocardial infarction, subsequent episode of care (HCC) 04/14/2017   NSTEMI (non-ST elevated myocardial infarction) (HCC) 07/14/2019   Past Surgical History:  Procedure Laterality Date   COLONOSCOPY WITH PROPOFOL N/A 05/02/2019   Procedure: COLONOSCOPY WITH PROPOFOL;  Surgeon: Midge Minium, MD;  Location: [facility];  Service: Endoscopy;  Laterality: N/A;  Diabetic - insulin and oral meds   CORONARY ARTERY BYPASS GRAFT  2006   2 vessel   CORONARY STENT INTERVENTION N/A 03/30/2017   Procedure: CORONARY STENT INTERVENTION;  Surgeon: Alwyn Pea, MD;  Location: [facility];  Service: Cardiovascular;  Laterality: N/A;   INGUINAL HERNIA REPAIR     right   LEFT HEART CATH AND CORONARY ANGIOGRAPHY N/A 07/14/2019   Procedure: LEFT HEART CATH AND CORONARY ANGIOGRAPHY;  Surgeon: Dalia Heading, MD;  Location: [facility];  Service: Cardiovascular;  Laterality: N/A;   LEFT HEART CATH AND CORS/GRAFTS ANGIOGRAPHY N/A 03/30/2017   Procedure: LEFT HEART CATH AND  CORS/GRAFTS ANGIOGRAPHY;  Surgeon: Lamar Blinks, MD;  Location: [facility];  Service: Cardiovascular;  Laterality: N/A;   Family History  Problem Relation Age of Onset   Hypertension Father    Hypertension Mother    Hypertension Sister    Hypertension Brother    Hypertension Brother    Hypertension Brother    Hypertension Brother    Hypertension Brother    Hypertension Sister    Hypertension Sister    Hypertension Sister    Hypertension Sister    Hypertension Sister    Social History   Socioeconomic History   Marital status: Married    Spouse name: Not on file   Number of children: Not on file   Years of education: Not on file   Highest education level: 9th grade  Occupational History   Occupation: retired  Tobacco Use   Smoking status: Former    Years: 40.00    Types: Cigarettes    Quit date: 2010    Years since quitting: 12.6   Smokeless  tobacco: Never  Vaping Use   Vaping Use: Never used  Substance and Sexual Activity   Alcohol use: No   Drug use: No   Sexual activity: Yes  Other Topics Concern   Not on file  Social History Narrative   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   Social Determinants of Health   Financial Resource Strain: Low Risk    Difficulty of Paying Living Expenses: Not hard at all  Food Insecurity: No Food Insecurity   Worried About Programme researcher, broadcasting/film/video in the Last Year: Never true   Barista in the Last Year: Never true  Transportation Needs: No Transportation Needs   Lack of Transportation (Medical): No   Lack of Transportation (Non-Medical): No  Physical Activity: Inactive   Days of Exercise per Week: 0 days   Minutes of Exercise per Session: 0 min  Stress: No Stress Concern Present   Feeling of Stress : Not at all  Social Connections: Not on file    Tobacco Counseling Counseling given: Not Answered   Clinical Intake:  Pre-visit preparation completed: Yes  Pain : No/denies pain      Nutritional Status: BMI > 30  Obese Nutritional Risks: None Diabetes: Yes  How often do you need to have someone help you when you read instructions, pamphlets, or other written materials from your doctor or pharmacy?: 1 - Never What is the last grade level you completed in school?: 4th grade  Diabetic? Yes Nutrition Risk Assessment:  Has the patient had any N/V/D within the last 2 months?  No  Does the patient have any non-healing wounds?  No  Has the patient had any unintentional weight loss or weight gain?  No   Diabetes:  Is the patient diabetic?  Yes  If diabetic, was a CBG obtained today?  No  Did the patient bring in their glucometer from home?  No  How often do you monitor your CBG's? daily.   Financial Strains and Diabetes Management:  Are you having any financial strains with the device, your supplies or your medication? No .  Does the patient want to be seen by Chronic Care Management for management of their diabetes?  No  Would the patient like to be referred to a Nutritionist or for Diabetic Management?  No   Diabetic Exams:  Diabetic Eye Exam: Overdue for diabetic eye exam. Pt has been advised about the importance in completing this exam. Patient advised to call and schedule an eye exam. Diabetic Foot Exam: Overdue, Pt has been advised about the importance in completing this exam. Pt is scheduled for diabetic foot exam on next appointment.   Interpreter Needed?: No  Information entered by :: NAllen LPN   Activities of Daily Living In your present state of health, do you have any difficulty performing the following activities: 10/22/2020  Hearing? N  Vision? N  Difficulty concentrating or making decisions? N  Walking or climbing stairs? N  Dressing or bathing? N  Doing errands, shopping? N  Preparing Food and eating ? N  Using the Toilet? N  In the past six months, have you accidently leaked urine? N  Do you have problems with loss of bowel control? N   Managing your Medications? N  Managing your Finances? N  Housekeeping or managing your Housekeeping? N  Some recent data might be hidden    Patient Care Team: Larae Grooms, NP as PCP - General Tiburcio Pea [facility]  as Pharmacist Scientist, research (physical sciences))  Indicate any recent Medical Services you may have received from other than [facility] providers in the past year (date may be approximate).     Assessment:   This is a routine wellness examination for [place].  Hearing/Vision screen Vision Screening - Comments:: No regular eye exams, none  Dietary issues and exercise activities discussed: Current Exercise Habits: The patient does not participate in regular exercise at present   Goals Addressed             This Visit's Progress    Patient Stated       10/22/2020, no goals       Depression Screen PHQ 2/9 Scores 10/22/2020 01/10/2020 10/21/2019 07/25/2019 07/19/2018 07/10/2017 05/18/2017  PHQ - 2 Score 0 0 0 0 0 0 0  PHQ- 9 Score - 0 1 - - - -    Fall Risk Fall Risk  10/22/2020 01/10/2020 10/21/2019 07/25/2019 07/19/2018  Falls in the past year? 0 0 0 0 0  Number falls in past yr: - 0 - 0 -  Injury with Fall? - 0 - 0 -  Risk for fall due to : Medication side effect No Fall Risks Medication side effect - -  Follow up Falls evaluation completed;Education provided;Falls prevention discussed Falls evaluation completed Falls evaluation completed;Education provided;Falls prevention discussed - -    FALL RISK PREVENTION PERTAINING TO THE HOME:  Any stairs in or around the home? Yes  If so, are there any without handrails? No  Home free of loose throw rugs in walkways, pet beds, electrical cords, etc? Yes  Adequate lighting in your home to reduce risk of falls? Yes   ASSISTIVE DEVICES UTILIZED TO PREVENT FALLS:  Life alert? No  Use of a cane, walker or w/c? No  Grab bars in the bathroom? Yes  Shower chair or bench in shower? Yes  Elevated toilet seat or a handicapped toilet? Yes   TIMED UP AND  GO:  Was the test performed? No .      Cognitive Function:     6CIT Screen 10/22/2020 10/21/2019 07/19/2018 07/10/2017  What Year? 0 points 0 points 0 points 4 points  What month? 0 points 3 points 0 points 3 points  What time? 0 points 0 points 0 points 0 points  Count back from 20 4 points 4 points 0 points 0 points  Months in reverse 4 points 4 points 0 points 0 points  Repeat phrase 10 points 10 points 0 points 2 points  Total Score 18 21 0 9    Immunizations Immunization History  Administered Date(s) Administered   Influenza Inj Mdck Quad Pf 12/28/2018   Influenza, High Dose Seasonal PF 12/31/2016, 11/23/2017   Influenza, Seasonal, Injecte, Preservative Fre 01/22/2006, 01/10/2008, 02/05/2009, 02/19/2010, 03/28/2011, 12/25/2011   Influenza,inj,Quad PF,6+ Mos 12/13/2013, 11/21/2014, 01/25/2016, 12/28/2019   Influenza-Unspecified 02/15/2013, 12/31/2016, 12/28/2018   Moderna Sars-Covid-2 Vaccination 04/21/2019, 05/19/2019, 03/18/2020   Pneumococcal Conjugate-13 08/15/2014   Pneumococcal Polysaccharide-23 05/02/2003, 02/19/2010    TDAP status: Due, Education has been provided regarding the importance of this vaccine. Advised may receive this vaccine at local pharmacy or Health Dept. Aware to provide a copy of the vaccination record if obtained from local pharmacy or Health Dept. Verbalized acceptance and understanding.  Flu Vaccine status: Due, Education has been provided regarding the importance of this vaccine. Advised may receive this vaccine at local pharmacy or Health Dept. Aware to provide a copy of the vaccination record if obtained from local pharmacy or Health  Dept. Verbalized acceptance and understanding.  Pneumococcal vaccine status: Up to date  Covid-19 vaccine status: Completed vaccines  Qualifies for Shingles Vaccine? Yes   Zostavax completed No   Shingrix Completed?: No.    Education has been provided regarding the importance of this vaccine. Patient has been  advised to call insurance company to determine out of pocket expense if they have not yet received this vaccine. Advised may also receive vaccine at local pharmacy or Health Dept. Verbalized acceptance and understanding.  Screening Tests Health Maintenance  Topic Date Due   FOOT EXAM  Never done   OPHTHALMOLOGY EXAM  Never done   TETANUS/TDAP  Never done   Zoster Vaccines- Shingrix (1 of 2) Never done   COVID-19 Vaccine (4 - Booster for Moderna series) 07/16/2020   INFLUENZA VACCINE  10/15/2020   HEMOGLOBIN A1C  04/05/2021   Hepatitis C Screening  Completed   PNA vac Low Risk Adult  Completed   HPV VACCINES  Aged Out    Health Maintenance  Health Maintenance Due  Topic Date Due   FOOT EXAM  Never done   OPHTHALMOLOGY EXAM  Never done   TETANUS/TDAP  Never done   Zoster Vaccines- Shingrix (1 of 2) Never done   COVID-19 Vaccine (4 - Booster for Moderna series) 07/16/2020   INFLUENZA VACCINE  10/15/2020    Colorectal cancer screening: No longer required.   Lung Cancer Screening: (Low Dose CT Chest recommended if Age 90-80 years, 30 pack-year currently smoking OR have quit w/in 15years.) does not qualify.   Lung Cancer Screening Referral: no  Additional Screening:  Hepatitis C Screening: does qualify; Completed 07/10/2017  Vision Screening: Recommended annual ophthalmology exams for early detection of glaucoma and other disorders of the eye. Is the patient up to date with their annual eye exam?  No  Who is the provider or what is the name of the office in which the patient attends annual eye exams? none If pt is not established with a provider, would they like to be referred to a provider to establish care? No .   Dental Screening: Recommended annual dental exams for proper oral hygiene  Community Resource Referral / Chronic Care Management: CRR required this visit?  No   CCM required this visit?  No      Plan:     I have personally reviewed and noted the following  in the patient's chart:   Medical and social history Use of alcohol, tobacco or illicit drugs  Current medications and supplements including opioid prescriptions. Patient is not currently taking opioid prescriptions. Functional ability and status Nutritional status Physical activity Advanced directives List of other physicians Hospitalizations, surgeries, and ER visits in previous 12 months Vitals Screenings to include cognitive, depression, and falls Referrals and appointments  In addition, I have reviewed and discussed with patient certain preventive protocols, quality metrics, and best practice recommendations. A written personalized care plan for preventive services as well as general preventive health recommendations were provided to patient.     Barb Merinoickeah E Loel Betancur, LPN   1/6/10968/10/2020   Nurse Notes:

## 2020-10-22 NOTE — Patient Instructions (Signed)
Benjamin Chan , Thank you for taking time to come for your Medicare Wellness Visit. I appreciate your ongoing commitment to your health goals. Please review the following plan we discussed and let me know if I can assist you in the future.   Screening recommendations/referrals: Colonoscopy: not required Recommended yearly ophthalmology/optometry visit for glaucoma screening and checkup Recommended yearly dental visit for hygiene and checkup  Vaccinations: Influenza vaccine: due Pneumococcal vaccine: completed 08/15/2014 Tdap vaccine: due Shingles vaccine: discussed   Covid-19:  03/18/2020, 05/19/2019, 04/21/2019  Advanced directives: Advance directive discussed with you today.   Conditions/risks identified: none  Next appointment: Follow up in one year for your annual wellness visit.   Preventive Care 21 Years and Older, Male Preventive care refers to lifestyle choices and visits with your health care provider that can promote health and wellness. What does preventive care include? A yearly physical exam. This is also called an annual well check. Dental exams once or twice a year. Routine eye exams. Ask your health care provider how often you should have your eyes checked. Personal lifestyle choices, including: Daily care of your teeth and gums. Regular physical activity. Eating a healthy diet. Avoiding tobacco and drug use. Limiting alcohol use. Practicing safe sex. Taking low doses of aspirin every day. Taking vitamin and mineral supplements as recommended by your health care provider. What happens during an annual well check? The services and screenings done by your health care provider during your annual well check will depend on your age, overall health, lifestyle risk factors, and family history of disease. Counseling  Your health care provider may ask you questions about your: Alcohol use. Tobacco use. Drug use. Emotional well-being. Home and relationship  well-being. Sexual activity. Eating habits. History of falls. Memory and ability to understand (cognition). Work and work Astronomer. Screening  You may have the following tests or measurements: Height, weight, and BMI. Blood pressure. Lipid and cholesterol levels. These may be checked every 5 years, or more frequently if you are over 5 years old. Skin check. Lung cancer screening. You may have this screening every year starting at age 43 if you have a 30-pack-year history of smoking and currently smoke or have quit within the past 15 years. Fecal occult blood test (FOBT) of the stool. You may have this test every year starting at age 78. Flexible sigmoidoscopy or colonoscopy. You may have a sigmoidoscopy every 5 years or a colonoscopy every 10 years starting at age 25. Prostate cancer screening. Recommendations will vary depending on your family history and other risks. Hepatitis C blood test. Hepatitis B blood test. Sexually transmitted disease (STD) testing. Diabetes screening. This is done by checking your blood sugar (glucose) after you have not eaten for a while (fasting). You may have this done every 1-3 years. Abdominal aortic aneurysm (AAA) screening. You may need this if you are a current or former smoker. Osteoporosis. You may be screened starting at age 17 if you are at high risk. Talk with your health care provider about your test results, treatment options, and if necessary, the need for more tests. Vaccines  Your health care provider may recommend certain vaccines, such as: Influenza vaccine. This is recommended every year. Tetanus, diphtheria, and acellular pertussis (Tdap, Td) vaccine. You may need a Td booster every 10 years. Zoster vaccine. You may need this after age 17. Pneumococcal 13-valent conjugate (PCV13) vaccine. One dose is recommended after age 61. Pneumococcal polysaccharide (PPSV23) vaccine. One dose is recommended after age 40. Talk  to your health care  provider about which screenings and vaccines you need and how often you need them. This information is not intended to replace advice given to you by your health care provider. Make sure you discuss any questions you have with your health care provider. Document Released: 03/30/2015 Document Revised: 11/21/2015 Document Reviewed: 01/02/2015 Elsevier Interactive Patient Education  2017 [place] Prevention in the Home Falls can cause injuries. They can happen to people of all ages. There are many things you can do to make your home safe and to help prevent falls. What can I do on the outside of my home? Regularly fix the edges of walkways and driveways and fix any cracks. Remove anything that might make you trip as you walk through a door, such as a raised step or threshold. Trim any bushes or trees on the path to your home. Use bright outdoor lighting. Clear any walking paths of anything that might make someone trip, such as rocks or tools. Regularly check to see if handrails are loose or broken. Make sure that both sides of any steps have handrails. Any raised decks and porches should have guardrails on the edges. Have any leaves, snow, or ice cleared regularly. Use sand or salt on walking paths during winter. Clean up any spills in your garage right away. This includes oil or grease spills. What can I do in the bathroom? Use night lights. Install grab bars by the toilet and in the tub and shower. Do not use towel bars as grab bars. Use non-skid mats or decals in the tub or shower. If you need to sit down in the shower, use a plastic, non-slip stool. Keep the floor dry. Clean up any water that spills on the floor as soon as it happens. Remove soap buildup in the tub or shower regularly. Attach bath mats securely with double-sided non-slip rug tape. Do not have throw rugs and other things on the floor that can make you trip. What can I do in the bedroom? Use night lights. Make  sure that you have a light by your bed that is easy to reach. Do not use any sheets or blankets that are too big for your bed. They should not hang down onto the floor. Have a firm chair that has side arms. You can use this for support while you get dressed. Do not have throw rugs and other things on the floor that can make you trip. What can I do in the kitchen? Clean up any spills right away. Avoid walking on wet floors. Keep items that you use a lot in easy-to-reach places. If you need to reach something above you, use a strong step stool that has a grab bar. Keep electrical cords out of the way. Do not use floor polish or wax that makes floors slippery. If you must use wax, use non-skid floor wax. Do not have throw rugs and other things on the floor that can make you trip. What can I do with my stairs? Do not leave any items on the stairs. Make sure that there are handrails on both sides of the stairs and use them. Fix handrails that are broken or loose. Make sure that handrails are as long as the stairways. Check any carpeting to make sure that it is firmly attached to the stairs. Fix any carpet that is loose or worn. Avoid having throw rugs at the top or bottom of the stairs. If you do have throw rugs,  attach them to the floor with carpet tape. Make sure that you have a light switch at the top of the stairs and the bottom of the stairs. If you do not have them, ask someone to add them for you. What else can I do to help prevent falls? Wear shoes that: Do not have high heels. Have rubber bottoms. Are comfortable and fit you well. Are closed at the toe. Do not wear sandals. If you use a stepladder: Make sure that it is fully opened. Do not climb a closed stepladder. Make sure that both sides of the stepladder are locked into place. Ask someone to hold it for you, if possible. Clearly mark and make sure that you can see: Any grab bars or handrails. First and last steps. Where the  edge of each step is. Use tools that help you move around (mobility aids) if they are needed. These include: Canes. Walkers. Scooters. Crutches. Turn on the lights when you go into a dark area. Replace any light bulbs as soon as they burn out. Set up your furniture so you have a clear path. Avoid moving your furniture around. If any of your floors are uneven, fix them. If there are any pets around you, be aware of where they are. Review your medicines with your doctor. Some medicines can make you feel dizzy. This can increase your chance of falling. Ask your doctor what other things that you can do to help prevent falls. This information is not intended to replace advice given to you by your health care provider. Make sure you discuss any questions you have with your health care provider. Document Released: 12/28/2008 Document Revised: 08/09/2015 Document Reviewed: 04/07/2014 Elsevier Interactive Patient Education  2017 [employer].

## 2020-11-13 ENCOUNTER — Other Ambulatory Visit: Payer: Self-pay | Admitting: Family Medicine

## 2020-11-14 NOTE — Telephone Encounter (Signed)
Requested Prescriptions  Pending Prescriptions Disp Refills  . ASPIRIN LOW DOSE 81 MG EC tablet [Pharmacy Med Name: aspirin 81 mg tablet,delayed release] 90 tablet 0    Sig: TAKE ONE TABLET BY MOUTH ONCE DAILY     Analgesics:  NSAIDS - aspirin Passed - 11/13/2020  4:20 PM      Passed - Patient is not pregnant      Passed - Valid encounter within last 12 months    Recent Outpatient Visits          1 month ago 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 heart without angina pectoris   [facility] Larae Grooms, NP   4 months ago Hypertension associated with diabetes (HCC)   [facility] Larae Grooms, NP   10 months ago Benign hypertensive renal disease   [name] Johnson, Megan P, DO   10 months ago Chronic congestive heart failure, unspecified heart failure type (HCC)   [name] Dellroy, Megan P, DO   1 year ago DM (diabetes mellitus), type 2, uncontrolled, with renal complications (HCC)   [name] [name], PA-C      Future Appointments            In 1 month Larae Grooms, NP [name], [facility]   In 11 months  [facility], [facility]           . ezetimibe (ZETIA) 10 MG tablet [Pharmacy Med Name: ezetimibe 10 mg tablet] 90 tablet 0    Sig: TAKE ONE TABLET BY MOUTH ONCE DAILY     Cardiovascular:  Antilipid - Sterol Transport Inhibitors Failed - 11/13/2020  4:20 PM      Failed - HDL in normal range and within 360 days    HDL  Date Value Ref Range Status  10/03/2020 36 (L) >39 mg/dL Final         Passed - Total Cholesterol in normal range and within 360 days    Cholesterol, Total  Date Value Ref Range Status  10/03/2020 153 100 - 199 mg/dL Final         Passed - LDL in normal range and within 360 days    LDL Chol Calc (NIH)  Date Value Ref Range Status  10/03/2020 91 0 - 99 mg/dL Final         Passed - Triglycerides in normal  range and within 360 days    Triglycerides  Date Value Ref Range Status  10/03/2020 144 0 - 149 mg/dL Final         Passed - Valid encounter within last 12 months    Recent Outpatient Visits          1 month ago 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 heart without angina pectoris   [facility] Larae Grooms, NP   4 months ago Hypertension associated with diabetes [facility])   [facility] Larae Grooms, NP   10 months ago Benign hypertensive renal disease   [name] Johnson, Megan P, DO   10 months ago Chronic congestive heart failure, unspecified heart failure type [facility])   [name] [place], Megan P, DO   1 year ago DM (diabetes mellitus), type 2, uncontrolled, with renal complications [facility])   [name] [name], [place]      Future Appointments            In 1 month Larae Grooms, NP [facility]   In 11 months  [facility], [facility]

## 2020-11-21 DIAGNOSIS — I509 Heart failure, unspecified: Secondary | ICD-10-CM | POA: Diagnosis not present

## 2020-11-21 DIAGNOSIS — E1122 Type 2 diabetes mellitus with diabetic chronic kidney disease: Secondary | ICD-10-CM | POA: Diagnosis not present

## 2020-11-21 DIAGNOSIS — I129 Hypertensive chronic kidney disease with stage 1 through stage 4 chronic kidney disease, or unspecified chronic kidney disease: Secondary | ICD-10-CM | POA: Diagnosis not present

## 2020-11-21 DIAGNOSIS — R8290 Unspecified abnormal findings in urine: Secondary | ICD-10-CM | POA: Diagnosis not present

## 2020-11-21 DIAGNOSIS — N1832 Chronic kidney disease, stage 3b: Secondary | ICD-10-CM | POA: Diagnosis not present

## 2020-11-22 ENCOUNTER — Other Ambulatory Visit: Payer: Self-pay | Admitting: Nephrology

## 2020-11-22 ENCOUNTER — Other Ambulatory Visit (HOSPITAL_COMMUNITY): Payer: Self-pay | Admitting: Nephrology

## 2020-11-22 DIAGNOSIS — R8290 Unspecified abnormal findings in urine: Secondary | ICD-10-CM

## 2020-12-05 ENCOUNTER — Ambulatory Visit: Payer: Medicare HMO | Attending: Nephrology

## 2020-12-11 ENCOUNTER — Other Ambulatory Visit: Payer: Self-pay

## 2020-12-11 MED ORDER — FLUTICASONE PROPIONATE 50 MCG/ACT NA SUSP: 1.0000 | Freq: Two times a day (BID) | NASAL | 2 refills | Status: AC

## 2020-12-11 MED ORDER — FINASTERIDE 5 MG PO TABS: 5.0000 mg | ORAL_TABLET | Freq: Every day | ORAL | 1 refills | Status: AC

## 2020-12-11 MED ORDER — ALBUTEROL SULFATE HFA 108 (90 BASE) MCG/ACT IN AERS: 2.0000 | INHALATION_SPRAY | Freq: Four times a day (QID) | RESPIRATORY_TRACT | 2 refills | Status: AC | PRN

## 2020-12-26 ENCOUNTER — Ambulatory Visit: Payer: Medicare HMO

## 2021-01-03 NOTE — Progress Notes (Deleted)
There were no vitals taken for this visit.   Subjective:    Patient ID: Benjamin Chan, male    DOB: April 13, 1945, 75 y.o.   MRN: [id number]  HPI: Benjamin Chan is a 75 y.o. male  No chief complaint on file.  HYPERTENSION / HYPERLIPIDEMIA Patient's wife states that his blood pressure medication was increased but she is not sure which one.  She states he is more tired than he was before it was increased. They have not followed up with Cardiology since June. Satisfied with current treatment? no Duration of hypertension: years BP monitoring frequency: rarely BP range:  BP medication side effects: no Past BP meds:  metoprolol and lasix and losartan (cozaar) Duration of hyperlipidemia: years Cholesterol medication side effects: no Cholesterol supplements: none Past cholesterol medications: atorvastain (lipitor) and ezetimide (zetia) Medication compliance: excellent compliance Aspirin: no Recent stressors: no Recurrent headaches: no Visual changes: no Palpitations: no Dyspnea: no Chest pain: no Lower extremity edema: no Dizzy/lightheaded: no  DIABETES Hypoglycemic episodes:no Polydipsia/polyuria: no Visual disturbance: no Chest pain: no Paresthesias: no Glucose Monitoring: yes  Accucheck frequency: Daily  Fasting glucose: 184  Post prandial:  Evening:  Before meals: Taking Insulin?: yes  Long acting insulin:  Short acting insulin: Blood Pressure Monitoring: rarely Retinal Examination: Not up to Date Foot Exam: Not up to Date Diabetic Education: Not Completed Pneumovax: Up to Date Influenza: Up to Date Aspirin: no   CHRONIC KIDNEY DISEASE CKD status: controlled Medications renally dose: yes Previous renal evaluation: no Pneumovax:  Up to Date Influenza Vaccine:  Up to Date    Relevant past medical, surgical, family and social history reviewed and updated as indicated. Interim medical history since our last visit reviewed. Allergies and medications  reviewed and updated.  Review of Systems  Constitutional:  Positive for fatigue.  Eyes:  Negative for visual disturbance.  Respiratory:  Negative for chest tightness and shortness of breath.   Cardiovascular:  Negative for chest pain, palpitations and leg swelling.  Endocrine: Negative for polydipsia and polyuria.  Neurological:  Negative for dizziness, light-headedness, numbness and headaches.   Per HPI unless specifically indicated above     Objective:    There were no vitals taken for this visit.  Wt Readings from Last 3 Encounters:  10/22/20 198 lb (89.8 kg)  10/03/20 194 lb 4 oz (88.1 kg)  01/10/20 190 lb (86.2 kg)    Physical Exam Vitals and nursing note reviewed.  Constitutional:      General: He is not in acute distress.    Appearance: Normal appearance. He is not ill-appearing, toxic-appearing or diaphoretic.  HENT:     Head: Normocephalic.     Right Ear: External ear normal.     Left Ear: External ear normal.     Nose: Nose normal. No congestion or rhinorrhea.     Mouth/Throat:     Mouth: Mucous membranes are moist.  Eyes:     General:        Right eye: No discharge.        Left eye: No discharge.     Extraocular Movements: Extraocular movements intact.     Conjunctiva/sclera: Conjunctivae normal.     Pupils: Pupils are equal, round, and reactive to light.  Cardiovascular:     Rate and Rhythm: Normal rate and regular rhythm.     Heart sounds: No murmur heard. Pulmonary:     Effort: Pulmonary effort is normal. No respiratory distress.     Breath sounds: Decreased  breath sounds present. No wheezing, rhonchi or rales.  Abdominal:     General: Abdomen is flat. Bowel sounds are normal.  Musculoskeletal:     Cervical back: Normal range of motion and neck supple.  Skin:    General: Skin is warm and dry.     Capillary Refill: Capillary refill takes less than 2 seconds.  Neurological:     General: No focal deficit present.     Mental Status: He is alert and  oriented to person, place, and time.  Psychiatric:        Mood and Affect: Mood normal.        Behavior: Behavior normal.        Thought Content: Thought content normal.        Judgment: Judgment normal.    Results for orders placed or performed in visit on 10/05/20  Microscopic Examination   Urine  Result Value Ref Range   WBC, UA None seen 0 - 5 /hpf   RBC 0-2 0 - 2 /hpf   Epithelial Cells (non renal) None seen 0 - 10 /hpf   Bacteria, UA None seen None seen/Few  Urinalysis, Routine w reflex microscopic  Result Value Ref Range   Specific Gravity, UA 1.015 1.005 - 1.030   pH, UA 6.0 5.0 - 7.5   Color, UA Yellow Yellow   Appearance Ur Clear Clear   Leukocytes,UA Negative Negative   Protein,UA Negative Negative/Trace   Glucose, UA 3+ (A) Negative   Ketones, UA Negative Negative   RBC, UA Trace (A) Negative   Bilirubin, UA Negative Negative   Urobilinogen, Ur 1.0 0.2 - 1.0 mg/dL   Nitrite, UA Negative Negative   Microscopic Examination See below:   Comprehensive metabolic panel  Result Value Ref Range   Glucose 214 (H) 65 - 99 mg/dL   BUN 32 (H) 8 - 27 mg/dL   Creatinine, Ser 2.12 (H) 0.76 - 1.27 mg/dL   eGFR 32 (L) >59 mL/min/1.73   BUN/Creatinine Ratio 15 10 - 24   Sodium 138 134 - 144 mmol/L   Potassium 4.2 3.5 - 5.2 mmol/L   Chloride 98 96 - 106 mmol/L   CO2 22 20 - 29 mmol/L   Calcium 9.7 8.6 - 10.2 mg/dL   Total Protein 7.2 6.0 - 8.5 g/dL   Albumin 4.5 3.7 - 4.7 g/dL   Globulin, Total 2.7 1.5 - 4.5 g/dL   Albumin/Globulin Ratio 1.7 1.2 - 2.2   Bilirubin Total 0.5 0.0 - 1.2 mg/dL   Alkaline Phosphatase 102 44 - 121 IU/L   AST 18 0 - 40 IU/L   ALT 15 0 - 44 IU/L      Assessment & Plan:   Problem List Items Addressed This Visit   None    Follow up plan: No follow-ups on file.

## 2021-01-04 ENCOUNTER — Ambulatory Visit: Payer: Medicare HMO | Admitting: Nurse Practitioner

## 2021-01-04 DIAGNOSIS — J449 Chronic obstructive pulmonary disease, unspecified: Secondary | ICD-10-CM

## 2021-01-04 DIAGNOSIS — N183 Chronic kidney disease, stage 3 unspecified: Secondary | ICD-10-CM

## 2021-01-04 DIAGNOSIS — I152 Hypertension secondary to endocrine disorders: Secondary | ICD-10-CM

## 2021-01-04 DIAGNOSIS — I5020 Unspecified systolic (congestive) heart failure: Secondary | ICD-10-CM

## 2021-01-04 DIAGNOSIS — I25810 Atherosclerosis of coronary artery bypass graft(s) without angina pectoris: Secondary | ICD-10-CM

## 2021-01-08 ENCOUNTER — Other Ambulatory Visit: Payer: Self-pay | Admitting: Nurse Practitioner

## 2021-01-09 NOTE — Telephone Encounter (Signed)
See below and advise as needed please. 

## 2021-01-09 NOTE — Telephone Encounter (Signed)
Requested medication (s) are due for refill today: Yes  Requested medication (s) are on the active medication list: Yes  Last refill:  5 months ago  Future visit scheduled: Yes  Notes to clinic:  Unable to refill per protocol, not assigned to protocol    Requested Prescriptions  Pending Prescriptions Disp Refills   TRELEGY ELLIPTA 100-62.5-25 MCG/ACT AEPB [Pharmacy Med Name: Trelegy Ellipta 100 mcg-62.5 mcg-25 mcg powder for inhalation] 60 each 11    Sig: INHALE 1 PUFF BY MOUTH INTO THE LUNGS ONCE A DAY     There is no refill protocol information for this order

## 2021-01-10 ENCOUNTER — Other Ambulatory Visit: Payer: Self-pay | Admitting: Family Medicine

## 2021-01-10 ENCOUNTER — Telehealth: Payer: Self-pay | Admitting: Nurse Practitioner

## 2021-01-10 ENCOUNTER — Other Ambulatory Visit: Payer: Self-pay

## 2021-01-10 NOTE — Telephone Encounter (Signed)
[facility] called and spoke to Osborn, Pensions consultant about the refill(s) Trellegy requested. Advised it was sent on 08/03/20 #60/11 refill(s). She says it was not received, the last one on file was from 01/06/20. I advised it was likely faxed in, so it will be resent. I am unable to reorder, routing to office for provider to reorder.

## 2021-01-10 NOTE — Telephone Encounter (Signed)
Medication Refill - Medication: Fluticasone-Umeclidin-Vilant (TRELEGY ELLIPTA) 100-62.5-25 MCG/INH AEPB  Has the patient contacted their pharmacy? Yes.    (Agent: If yes, when and what did the pharmacy advise?) Autumn from the pharmacy would like request expedited. Caller states they are currently packing the patients medication and patient will be in tomorrow to pick up.Caller did state request was sent in at the beginning of the week.    Preferred Pharmacy (with phone number or street name):  [facility] - [place], [place] - [address] Phone:  [phone]  Fax:  [phone]      Has the patient been seen for an appointment in the last year OR does the patient have an upcoming appointment? Yes.    Agent: Please be advised that RX refills may take up to 3 business days. We ask that you follow-up with your pharmacy.

## 2021-01-11 NOTE — Telephone Encounter (Signed)
Just an fyi.  See previous notes for Trelegy medication.

## 2021-01-11 NOTE — Telephone Encounter (Signed)
Requested medication (s) are due for refill today:   See pharmacy request that was sent on 01/10/2021  Requested medication (s) are on the active medication list:   Yes  Future visit scheduled:   No   Last ordered: 08/03/2020 #60, 11 refills  See previous note from pharmacy.  No protocol assigned to this medication so it was returned to the practice on 01/10/2021.  Phar. Is packing his medications for him to pick up today 10/28.      Requested Prescriptions  Pending Prescriptions Disp Refills   TRELEGY ELLIPTA 100-62.5-25 MCG/ACT AEPB [Pharmacy Med Name: Trelegy Ellipta 100 mcg-62.5 mcg-25 mcg powder for inhalation] 60 each 11    Sig: INHALE 1 PUFF BY MOUTH INTO THE LUNGS ONCE A DAY     There is no refill protocol information for this order

## 2021-01-12 ENCOUNTER — Other Ambulatory Visit: Payer: Self-pay | Admitting: Family Medicine

## 2021-01-12 NOTE — Telephone Encounter (Signed)
last RF 08/03/20 #60 each 11 refills

## 2021-01-29 ENCOUNTER — Other Ambulatory Visit: Payer: Self-pay

## 2021-01-29 ENCOUNTER — Inpatient Hospital Stay: Payer: Medicare HMO

## 2021-01-29 ENCOUNTER — Encounter: Payer: Self-pay | Admitting: Emergency Medicine

## 2021-01-29 ENCOUNTER — Encounter: Admission: EM | Disposition: E | Payer: Self-pay | Source: Home / Self Care | Attending: Pulmonary Disease

## 2021-01-29 ENCOUNTER — Emergency Department: Payer: Medicare HMO

## 2021-01-29 DIAGNOSIS — N1832 Chronic kidney disease, stage 3b: Secondary | ICD-10-CM

## 2021-01-29 DIAGNOSIS — G9340 Encephalopathy, unspecified: Secondary | ICD-10-CM

## 2021-01-29 DIAGNOSIS — J811 Chronic pulmonary edema: Secondary | ICD-10-CM | POA: Diagnosis not present

## 2021-01-29 DIAGNOSIS — Z7984 Long term (current) use of oral hypoglycemic drugs: Secondary | ICD-10-CM

## 2021-01-29 DIAGNOSIS — I2510 Atherosclerotic heart disease of native coronary artery without angina pectoris: Secondary | ICD-10-CM

## 2021-01-29 DIAGNOSIS — I5023 Acute on chronic systolic (congestive) heart failure: Secondary | ICD-10-CM

## 2021-01-29 DIAGNOSIS — R0689 Other abnormalities of breathing: Secondary | ICD-10-CM | POA: Diagnosis not present

## 2021-01-29 DIAGNOSIS — I6389 Other cerebral infarction: Secondary | ICD-10-CM | POA: Diagnosis not present

## 2021-01-29 DIAGNOSIS — Z0189 Encounter for other specified special examinations: Secondary | ICD-10-CM

## 2021-01-29 DIAGNOSIS — I462 Cardiac arrest due to underlying cardiac condition: Secondary | ICD-10-CM | POA: Diagnosis not present

## 2021-01-29 DIAGNOSIS — J690 Pneumonitis due to inhalation of food and vomit: Secondary | ICD-10-CM | POA: Diagnosis not present

## 2021-01-29 DIAGNOSIS — I081 Rheumatic disorders of both mitral and tricuspid valves: Secondary | ICD-10-CM | POA: Diagnosis present

## 2021-01-29 DIAGNOSIS — E1122 Type 2 diabetes mellitus with diabetic chronic kidney disease: Secondary | ICD-10-CM | POA: Diagnosis present

## 2021-01-29 DIAGNOSIS — J9811 Atelectasis: Secondary | ICD-10-CM | POA: Diagnosis not present

## 2021-01-29 DIAGNOSIS — I214 Non-ST elevation (NSTEMI) myocardial infarction: Secondary | ICD-10-CM | POA: Diagnosis not present

## 2021-01-29 DIAGNOSIS — I255 Ischemic cardiomyopathy: Secondary | ICD-10-CM | POA: Diagnosis present

## 2021-01-29 DIAGNOSIS — I130 Hypertensive heart and chronic kidney disease with heart failure and stage 1 through stage 4 chronic kidney disease, or unspecified chronic kidney disease: Secondary | ICD-10-CM | POA: Diagnosis present

## 2021-01-29 DIAGNOSIS — R570 Cardiogenic shock: Secondary | ICD-10-CM

## 2021-01-29 DIAGNOSIS — I25810 Atherosclerosis of coronary artery bypass graft(s) without angina pectoris: Secondary | ICD-10-CM

## 2021-01-29 DIAGNOSIS — Z8249 Family history of ischemic heart disease and other diseases of the circulatory system: Secondary | ICD-10-CM

## 2021-01-29 DIAGNOSIS — M19041 Primary osteoarthritis, right hand: Secondary | ICD-10-CM | POA: Diagnosis not present

## 2021-01-29 DIAGNOSIS — J9601 Acute respiratory failure with hypoxia: Secondary | ICD-10-CM

## 2021-01-29 DIAGNOSIS — Z951 Presence of aortocoronary bypass graft: Secondary | ICD-10-CM

## 2021-01-29 DIAGNOSIS — E1165 Type 2 diabetes mellitus with hyperglycemia: Secondary | ICD-10-CM | POA: Diagnosis not present

## 2021-01-29 DIAGNOSIS — Z1389 Encounter for screening for other disorder: Secondary | ICD-10-CM

## 2021-01-29 DIAGNOSIS — Z66 Do not resuscitate: Secondary | ICD-10-CM | POA: Diagnosis not present

## 2021-01-29 DIAGNOSIS — E872 Acidosis, unspecified: Secondary | ICD-10-CM | POA: Diagnosis present

## 2021-01-29 DIAGNOSIS — R4020 Unspecified coma: Secondary | ICD-10-CM | POA: Diagnosis not present

## 2021-01-29 DIAGNOSIS — R2990 Unspecified symptoms and signs involving the nervous system: Secondary | ICD-10-CM | POA: Diagnosis not present

## 2021-01-29 DIAGNOSIS — J449 Chronic obstructive pulmonary disease, unspecified: Secondary | ICD-10-CM | POA: Diagnosis present

## 2021-01-29 DIAGNOSIS — Z7902 Long term (current) use of antithrombotics/antiplatelets: Secondary | ICD-10-CM

## 2021-01-29 DIAGNOSIS — I6381 Other cerebral infarction due to occlusion or stenosis of small artery: Secondary | ICD-10-CM | POA: Diagnosis not present

## 2021-01-29 DIAGNOSIS — R6521 Severe sepsis with septic shock: Secondary | ICD-10-CM | POA: Diagnosis not present

## 2021-01-29 DIAGNOSIS — I252 Old myocardial infarction: Secondary | ICD-10-CM

## 2021-01-29 DIAGNOSIS — E1140 Type 2 diabetes mellitus with diabetic neuropathy, unspecified: Secondary | ICD-10-CM | POA: Diagnosis present

## 2021-01-29 DIAGNOSIS — A419 Sepsis, unspecified organism: Secondary | ICD-10-CM | POA: Diagnosis not present

## 2021-01-29 DIAGNOSIS — R4189 Other symptoms and signs involving cognitive functions and awareness: Secondary | ICD-10-CM

## 2021-01-29 DIAGNOSIS — Z515 Encounter for palliative care: Secondary | ICD-10-CM

## 2021-01-29 DIAGNOSIS — R55 Syncope and collapse: Secondary | ICD-10-CM | POA: Diagnosis not present

## 2021-01-29 DIAGNOSIS — J9690 Respiratory failure, unspecified, unspecified whether with hypoxia or hypercapnia: Secondary | ICD-10-CM | POA: Diagnosis not present

## 2021-01-29 DIAGNOSIS — G928 Other toxic encephalopathy: Secondary | ICD-10-CM | POA: Diagnosis not present

## 2021-01-29 DIAGNOSIS — R600 Localized edema: Secondary | ICD-10-CM | POA: Diagnosis not present

## 2021-01-29 DIAGNOSIS — E119 Type 2 diabetes mellitus without complications: Secondary | ICD-10-CM

## 2021-01-29 DIAGNOSIS — Z955 Presence of coronary angioplasty implant and graft: Secondary | ICD-10-CM

## 2021-01-29 DIAGNOSIS — I248 Other forms of acute ischemic heart disease: Secondary | ICD-10-CM | POA: Diagnosis not present

## 2021-01-29 DIAGNOSIS — I213 ST elevation (STEMI) myocardial infarction of unspecified site: Secondary | ICD-10-CM | POA: Diagnosis present

## 2021-01-29 DIAGNOSIS — Z888 Allergy status to other drugs, medicaments and biological substances status: Secondary | ICD-10-CM

## 2021-01-29 DIAGNOSIS — Z79899 Other long term (current) drug therapy: Secondary | ICD-10-CM

## 2021-01-29 DIAGNOSIS — R609 Edema, unspecified: Secondary | ICD-10-CM

## 2021-01-29 DIAGNOSIS — D72829 Elevated white blood cell count, unspecified: Secondary | ICD-10-CM

## 2021-01-29 DIAGNOSIS — I2119 ST elevation (STEMI) myocardial infarction involving other coronary artery of inferior wall: Secondary | ICD-10-CM | POA: Diagnosis not present

## 2021-01-29 DIAGNOSIS — J9600 Acute respiratory failure, unspecified whether with hypoxia or hypercapnia: Secondary | ICD-10-CM

## 2021-01-29 DIAGNOSIS — I517 Cardiomegaly: Secondary | ICD-10-CM | POA: Diagnosis not present

## 2021-01-29 DIAGNOSIS — R079 Chest pain, unspecified: Secondary | ICD-10-CM | POA: Diagnosis not present

## 2021-01-29 DIAGNOSIS — G9341 Metabolic encephalopathy: Secondary | ICD-10-CM | POA: Diagnosis not present

## 2021-01-29 DIAGNOSIS — N179 Acute kidney failure, unspecified: Secondary | ICD-10-CM

## 2021-01-29 DIAGNOSIS — R29818 Other symptoms and signs involving the nervous system: Secondary | ICD-10-CM | POA: Diagnosis not present

## 2021-01-29 DIAGNOSIS — Z01818 Encounter for other preprocedural examination: Secondary | ICD-10-CM | POA: Diagnosis not present

## 2021-01-29 DIAGNOSIS — R404 Transient alteration of awareness: Secondary | ICD-10-CM | POA: Diagnosis not present

## 2021-01-29 DIAGNOSIS — R4182 Altered mental status, unspecified: Secondary | ICD-10-CM | POA: Diagnosis not present

## 2021-01-29 DIAGNOSIS — I639 Cerebral infarction, unspecified: Secondary | ICD-10-CM | POA: Diagnosis not present

## 2021-01-29 DIAGNOSIS — E875 Hyperkalemia: Secondary | ICD-10-CM | POA: Diagnosis not present

## 2021-01-29 DIAGNOSIS — Z794 Long term (current) use of insulin: Secondary | ICD-10-CM

## 2021-01-29 DIAGNOSIS — N189 Chronic kidney disease, unspecified: Secondary | ICD-10-CM

## 2021-01-29 DIAGNOSIS — Z7982 Long term (current) use of aspirin: Secondary | ICD-10-CM

## 2021-01-29 DIAGNOSIS — Z87891 Personal history of nicotine dependence: Secondary | ICD-10-CM

## 2021-01-29 DIAGNOSIS — Z7985 Long-term (current) use of injectable non-insulin antidiabetic drugs: Secondary | ICD-10-CM

## 2021-01-29 DIAGNOSIS — R0789 Other chest pain: Secondary | ICD-10-CM | POA: Diagnosis not present

## 2021-01-29 DIAGNOSIS — Z20822 Contact with and (suspected) exposure to covid-19: Secondary | ICD-10-CM | POA: Diagnosis not present

## 2021-01-29 DIAGNOSIS — Z7189 Other specified counseling: Secondary | ICD-10-CM | POA: Diagnosis not present

## 2021-01-29 DIAGNOSIS — I959 Hypotension, unspecified: Secondary | ICD-10-CM | POA: Diagnosis not present

## 2021-01-29 DIAGNOSIS — E785 Hyperlipidemia, unspecified: Secondary | ICD-10-CM | POA: Diagnosis present

## 2021-01-29 DIAGNOSIS — I509 Heart failure, unspecified: Secondary | ICD-10-CM | POA: Diagnosis not present

## 2021-01-29 DIAGNOSIS — N400 Enlarged prostate without lower urinary tract symptoms: Secondary | ICD-10-CM | POA: Diagnosis present

## 2021-01-29 HISTORY — PX: LEFT HEART CATH AND CORS/GRAFTS ANGIOGRAPHY: CATH118250

## 2021-01-29 LAB — COMPREHENSIVE METABOLIC PANEL
ALT: 24 U/L (ref 0–44)
AST: 31 U/L (ref 15–41)
Albumin: 4.2 g/dL (ref 3.5–5.0)
Alkaline Phosphatase: 82 U/L (ref 38–126)
Anion gap: 10 (ref 5–15)
BUN: 32 mg/dL — ABNORMAL HIGH (ref 8–23)
CO2: 22 mmol/L (ref 22–32)
Calcium: 8.6 mg/dL — ABNORMAL LOW (ref 8.9–10.3)
Chloride: 104 mmol/L (ref 98–111)
Creatinine, Ser: 1.91 mg/dL — ABNORMAL HIGH (ref 0.61–1.24)
GFR, Estimated: 36 mL/min — ABNORMAL LOW (ref >60)
Glucose, Bld: 192 mg/dL — ABNORMAL HIGH (ref 70–99)
Potassium: 3.3 mmol/L — ABNORMAL LOW (ref 3.5–5.1)
Sodium: 136 mmol/L (ref 135–145)
Total Bilirubin: 0.9 mg/dL (ref 0.3–1.2)
Total Protein: 7.8 g/dL (ref 6.5–8.1)

## 2021-01-29 LAB — BLOOD GAS, ARTERIAL
Acid-base deficit: 4.8 mmol/L — ABNORMAL HIGH (ref 0.0–2.0)
Acid-base deficit: 5.1 mmol/L — ABNORMAL HIGH (ref 0.0–2.0)
Bicarbonate: 21.1 mmol/L (ref 20.0–28.0)
Bicarbonate: 22.4 mmol/L (ref 20.0–28.0)
FIO2: 1
FIO2: 100
MECHVT: 500 mL
MECHVT: 450 mL
O2 Saturation: 99.4 %
O2 Saturation: 99.2 %
PEEP: 5 cmH2O
PEEP: 5 cmH2O
Patient temperature: 37
Patient temperature: 37
RATE: 18 resp/min
RATE: 26 resp/min
pCO2 arterial: 41 mmHg (ref 32.0–48.0)
pCO2 arterial: 50 mmHg — ABNORMAL HIGH (ref 32.0–48.0)
pH, Arterial: 7.32 — ABNORMAL LOW (ref 7.350–7.450)
pH, Arterial: 7.26 — ABNORMAL LOW (ref 7.350–7.450)
pO2, Arterial: 170 mmHg — ABNORMAL HIGH (ref 83.0–108.0)
pO2, Arterial: 161 mmHg — ABNORMAL HIGH (ref 83.0–108.0)

## 2021-01-29 LAB — CBC WITH DIFFERENTIAL/PLATELET
Abs Immature Granulocytes: 0.08 10*3/uL — ABNORMAL HIGH (ref 0.00–0.07)
Basophils Absolute: 0.1 10*3/uL (ref 0.0–0.1)
Basophils Relative: 1 %
Eosinophils Absolute: 0.1 10*3/uL (ref 0.0–0.5)
Eosinophils Relative: 1 %
HCT: 44.2 % (ref 39.0–52.0)
Hemoglobin: 14.7 g/dL (ref 13.0–17.0)
Immature Granulocytes: 1 %
Lymphocytes Relative: 31 %
Lymphs Abs: 4.4 10*3/uL — ABNORMAL HIGH (ref 0.7–4.0)
MCH: 30.6 pg (ref 26.0–34.0)
MCHC: 33.3 g/dL (ref 30.0–36.0)
MCV: 91.9 fL (ref 80.0–100.0)
Monocytes Absolute: 1 10*3/uL (ref 0.1–1.0)
Monocytes Relative: 7 %
Neutro Abs: 8.2 10*3/uL — ABNORMAL HIGH (ref 1.7–7.7)
Neutrophils Relative %: 59 %
Platelets: 284 10*3/uL (ref 150–400)
RBC: 4.81 MIL/uL (ref 4.22–5.81)
RDW: 12.8 % (ref 11.5–15.5)
WBC: 13.9 10*3/uL — ABNORMAL HIGH (ref 4.0–10.5)
nRBC: 0 % (ref 0.0–0.2)

## 2021-01-29 LAB — URINALYSIS, COMPLETE (UACMP) WITH MICROSCOPIC
Bacteria, UA: NONE SEEN
Bilirubin Urine: NEGATIVE
Glucose, UA: >=500 mg/dL — AB
Hgb urine dipstick: NEGATIVE
Ketones, ur: NEGATIVE mg/dL
Leukocytes,Ua: NEGATIVE
Nitrite: NEGATIVE
Protein, ur: NEGATIVE mg/dL
Specific Gravity, Urine: 1.022 (ref 1.005–1.030)
pH: 5 (ref 5.0–8.0)

## 2021-01-29 LAB — BRAIN NATRIURETIC PEPTIDE: B Natriuretic Peptide: 642.5 pg/mL — ABNORMAL HIGH (ref 0.0–100.0)

## 2021-01-29 LAB — BLOOD GAS, VENOUS
Acid-base deficit: 4.6 mmol/L — ABNORMAL HIGH (ref 0.0–2.0)
Bicarbonate: 23.9 mmol/L (ref 20.0–28.0)
FIO2: 80
MECHVT: 450 mL
O2 Saturation: 73.5 %
PEEP: 5 cmH2O
Patient temperature: 37
RATE: 30 resp/min
pCO2, Ven: 57 mmHg (ref 44.0–60.0)
pH, Ven: 7.23 — ABNORMAL LOW (ref 7.250–7.430)
pO2, Ven: 47 mmHg — ABNORMAL HIGH (ref 32.0–45.0)

## 2021-01-29 LAB — PROTIME-INR
INR: 1.1 (ref 0.8–1.2)
Prothrombin Time: 13.7 seconds (ref 11.4–15.2)

## 2021-01-29 LAB — RESP PANEL BY RT-PCR (FLU A&B, COVID) ARPGX2
Influenza A by PCR: NEGATIVE
Influenza B by PCR: NEGATIVE
SARS Coronavirus 2 by RT PCR: NEGATIVE

## 2021-01-29 LAB — TROPONIN I (HIGH SENSITIVITY): Troponin I (High Sensitivity): 103 ng/L (ref <18)

## 2021-01-29 LAB — MAGNESIUM: Magnesium: 2.5 mg/dL — ABNORMAL HIGH (ref 1.7–2.4)

## 2021-01-29 LAB — PROCALCITONIN: Procalcitonin: 0.65 ng/mL

## 2021-01-29 LAB — APTT: aPTT: 27 seconds (ref 24–36)

## 2021-01-29 LAB — LACTIC ACID, PLASMA
Lactic Acid, Venous: 3.7 mmol/L (ref 0.5–1.9)
Lactic Acid, Venous: 2.2 mmol/L (ref 0.5–1.9)

## 2021-01-29 SURGERY — LEFT HEART CATH AND CORS/GRAFTS ANGIOGRAPHY
Anesthesia: Moderate Sedation

## 2021-01-29 MED ORDER — MIDAZOLAM HCL 2 MG/2ML IJ SOLN
2.0000 mg | INTRAMUSCULAR | Status: DC | PRN
  Administered 2021-01-29 – 2021-02-01 (×6): 2 mg via INTRAVENOUS
  Filled 2021-01-29 (×6): qty 2

## 2021-01-29 MED ORDER — IPRATROPIUM-ALBUTEROL 0.5-2.5 (3) MG/3ML IN SOLN: 3.0000 mL | RESPIRATORY_TRACT | Status: DC | PRN

## 2021-01-29 MED ORDER — PANTOPRAZOLE SODIUM 40 MG IV SOLR
40.0000 mg | INTRAVENOUS | Status: DC
  Administered 2021-01-29 – 2021-01-31 (×3): 40 mg via INTRAVENOUS
  Filled 2021-01-29 (×3): qty 40

## 2021-01-29 MED ORDER — ROCURONIUM BROMIDE 10 MG/ML (PF) SYRINGE: 100.0000 mg | PREFILLED_SYRINGE | Freq: Once | INTRAVENOUS | Status: DC

## 2021-01-29 MED ORDER — HEPARIN (PORCINE) IN NACL 1000-0.9 UT/500ML-% IV SOLN
INTRAVENOUS | Status: DC | PRN
  Administered 2021-01-29 (×2): 500 mL

## 2021-01-29 MED ORDER — FENTANYL CITRATE (PF) 100 MCG/2ML IJ SOLN: INTRAMUSCULAR | Status: AC | PRN

## 2021-01-29 MED ORDER — CLOPIDOGREL BISULFATE 75 MG PO TABS
75.0000 mg | ORAL_TABLET | Freq: Every day | ORAL | Status: DC
  Administered 2021-01-30 – 2021-01-31 (×2): 75 mg
  Filled 2021-01-29 (×2): qty 1

## 2021-01-29 MED ORDER — NOREPINEPHRINE 4 MG/250ML-% IV SOLN
0.0000 ug/min | INTRAVENOUS | Status: DC
  Administered 2021-01-30: 14 ug/min via INTRAVENOUS
  Administered 2021-01-30: 15 ug/min via INTRAVENOUS
  Administered 2021-01-30: 13 ug/min via INTRAVENOUS
  Administered 2021-01-30: 14 ug/min via INTRAVENOUS
  Administered 2021-01-30: 13 ug/min via INTRAVENOUS
  Filled 2021-01-29 (×5): qty 250

## 2021-01-29 MED ORDER — FUROSEMIDE 10 MG/ML IJ SOLN
80.0000 mg | Freq: Two times a day (BID) | INTRAMUSCULAR | Status: DC
  Administered 2021-01-30: 80 mg via INTRAVENOUS
  Filled 2021-01-29: qty 8

## 2021-01-29 MED ORDER — SODIUM CHLORIDE 0.9 % IV SOLN
INTRAVENOUS | Status: AC | PRN
  Administered 2021-01-29: 1000 mL via INTRAVENOUS

## 2021-01-29 MED ORDER — MIDAZOLAM HCL 2 MG/2ML IJ SOLN
INTRAMUSCULAR | Status: AC
  Filled 2021-01-29: qty 2

## 2021-01-29 MED ORDER — ACETAMINOPHEN 325 MG PO TABS: 650.0000 mg | ORAL_TABLET | ORAL | Status: DC | PRN

## 2021-01-29 MED ORDER — ETOMIDATE 2 MG/ML IV SOLN
INTRAVENOUS | Status: AC
  Administered 2021-01-29: 20 mg via INTRAVENOUS
  Filled 2021-01-29: qty 10

## 2021-01-29 MED ORDER — HEPARIN (PORCINE) 25000 UT/250ML-% IV SOLN
950.0000 [IU]/h | INTRAVENOUS | Status: DC
  Administered 2021-01-29: 950 [IU]/h via INTRAVENOUS
  Filled 2021-01-29: qty 250

## 2021-01-29 MED ORDER — ROCURONIUM BROMIDE 10 MG/ML (PF) SYRINGE
PREFILLED_SYRINGE | INTRAVENOUS | Status: AC
  Filled 2021-01-29: qty 10

## 2021-01-29 MED ORDER — SODIUM CHLORIDE 0.9% FLUSH: 3.0000 mL | INTRAVENOUS | Status: DC | PRN

## 2021-01-29 MED ORDER — HEPARIN SODIUM (PORCINE) 5000 UNIT/ML IJ SOLN: 4000.0000 [IU] | Freq: Once | INTRAMUSCULAR | Status: AC

## 2021-01-29 MED ORDER — FENTANYL CITRATE PF 50 MCG/ML IJ SOSY: 100.0000 ug | PREFILLED_SYRINGE | Freq: Once | INTRAMUSCULAR | Status: DC

## 2021-01-29 MED ORDER — FUROSEMIDE 10 MG/ML IJ SOLN
INTRAMUSCULAR | Status: AC
  Filled 2021-01-29: qty 8

## 2021-01-29 MED ORDER — IOHEXOL 350 MG/ML SOLN
INTRAVENOUS | Status: DC | PRN
  Administered 2021-01-29: 40 mL

## 2021-01-29 MED ORDER — HEPARIN SODIUM (PORCINE) 1000 UNIT/ML IJ SOLN
INTRAMUSCULAR | Status: DC | PRN
  Administered 2021-01-29: 4500 [IU] via INTRAVENOUS

## 2021-01-29 MED ORDER — HEPARIN (PORCINE) IN NACL 1000-0.9 UT/500ML-% IV SOLN
INTRAVENOUS | Status: AC
  Filled 2021-01-29: qty 1000

## 2021-01-29 MED ORDER — FENTANYL 2500MCG IN NS 250ML (10MCG/ML) PREMIX INFUSION: 100.0000 ug/h | INTRAVENOUS | Status: DC

## 2021-01-29 MED ORDER — HEPARIN SODIUM (PORCINE) 5000 UNIT/ML IJ SOLN
INTRAMUSCULAR | Status: AC
  Administered 2021-01-29: 4000 [IU] via INTRAVENOUS
  Filled 2021-01-29: qty 1

## 2021-01-29 MED ORDER — VERAPAMIL HCL 2.5 MG/ML IV SOLN
INTRAVENOUS | Status: AC
  Filled 2021-01-29: qty 2

## 2021-01-29 MED ORDER — FENTANYL CITRATE PF 50 MCG/ML IJ SOSY: 25.0000 ug | PREFILLED_SYRINGE | Freq: Once | INTRAMUSCULAR | Status: AC

## 2021-01-29 MED ORDER — FINASTERIDE 5 MG PO TABS
5.0000 mg | ORAL_TABLET | Freq: Every day | ORAL | Status: DC
  Administered 2021-01-30 – 2021-01-31 (×2): 5 mg via ORAL
  Filled 2021-01-29 (×2): qty 1

## 2021-01-29 MED ORDER — HEPARIN SODIUM (PORCINE) 1000 UNIT/ML IJ SOLN
INTRAMUSCULAR | Status: AC
  Filled 2021-01-29: qty 1

## 2021-01-29 MED ORDER — FENTANYL BOLUS VIA INFUSION
25.0000 ug | INTRAVENOUS | Status: DC | PRN
  Administered 2021-01-29: 25 ug via INTRAVENOUS
  Administered 2021-01-30: 50 ug via INTRAVENOUS
  Filled 2021-01-29: qty 100

## 2021-01-29 MED ORDER — ONDANSETRON HCL 4 MG/2ML IJ SOLN: 4.0000 mg | Freq: Four times a day (QID) | INTRAMUSCULAR | Status: DC | PRN

## 2021-01-29 MED ORDER — LIDOCAINE HCL 1 % IJ SOLN
INTRAMUSCULAR | Status: AC
  Filled 2021-01-29: qty 20

## 2021-01-29 MED ORDER — PROPOFOL 1000 MG/100ML IV EMUL
INTRAVENOUS | Status: AC
  Filled 2021-01-29: qty 100

## 2021-01-29 MED ORDER — INSULIN ASPART 100 UNIT/ML IJ SOLN
0.0000 [IU] | INTRAMUSCULAR | Status: DC
  Administered 2021-01-30: 2 [IU] via SUBCUTANEOUS
  Administered 2021-01-30 (×2): 5 [IU] via SUBCUTANEOUS
  Administered 2021-01-30: 2 [IU] via SUBCUTANEOUS
  Administered 2021-01-30: 5 [IU] via SUBCUTANEOUS
  Administered 2021-01-30 – 2021-01-31 (×3): 8 [IU] via SUBCUTANEOUS
  Administered 2021-01-31: 20:00:00 3 [IU] via SUBCUTANEOUS
  Administered 2021-01-31: 03:00:00 5 [IU] via SUBCUTANEOUS
  Administered 2021-01-31: 17:00:00 3 [IU] via SUBCUTANEOUS
  Administered 2021-01-31: 23:00:00 5 [IU] via SUBCUTANEOUS
  Administered 2021-01-31: 08:00:00 8 [IU] via SUBCUTANEOUS
  Administered 2021-02-01: 5 [IU] via SUBCUTANEOUS
  Administered 2021-02-01: 8 [IU] via SUBCUTANEOUS
  Filled 2021-01-29 (×15): qty 1

## 2021-01-29 MED ORDER — MIDAZOLAM HCL 2 MG/2ML IJ SOLN
INTRAMUSCULAR | Status: DC | PRN
  Administered 2021-01-29: 2 mg via INTRAVENOUS
  Administered 2021-01-29 (×2): 1 mg via INTRAVENOUS

## 2021-01-29 MED ORDER — NOREPINEPHRINE 4 MG/250ML-% IV SOLN
INTRAVENOUS | Status: AC
  Filled 2021-01-29: qty 250

## 2021-01-29 MED ORDER — CHLORHEXIDINE GLUCONATE CLOTH 2 % EX PADS
6.0000 | MEDICATED_PAD | Freq: Every day | CUTANEOUS | Status: DC
Start: 2021-01-30 — End: 2021-02-01
  Administered 2021-01-30 – 2021-01-31 (×3): 6 via TOPICAL

## 2021-01-29 MED ORDER — ROCURONIUM BROMIDE 50 MG/5ML IV SOLN
INTRAVENOUS | Status: AC | PRN
  Administered 2021-01-29: 100 mg via INTRAVENOUS

## 2021-01-29 MED ORDER — POTASSIUM CHLORIDE 20 MEQ PO PACK
40.0000 meq | PACK | Freq: Once | ORAL | Status: AC
  Administered 2021-01-29: 40 meq
  Filled 2021-01-29: qty 2

## 2021-01-29 MED ORDER — VERAPAMIL HCL 2.5 MG/ML IV SOLN
INTRAVENOUS | Status: DC | PRN
  Administered 2021-01-29: 2.5 mg via INTRACORONARY

## 2021-01-29 MED ORDER — NOREPINEPHRINE 4 MG/250ML-% IV SOLN
INTRAVENOUS | Status: AC | PRN
  Administered 2021-01-29: 10 ug/min via INTRAVENOUS

## 2021-01-29 MED ORDER — PROPOFOL 1000 MG/100ML IV EMUL: 5.0000 ug/kg/min | INTRAVENOUS | Status: DC

## 2021-01-29 MED ORDER — ASPIRIN 300 MG RE SUPP
300.0000 mg | Freq: Once | RECTAL | Status: AC
  Administered 2021-01-29: 300 mg via RECTAL

## 2021-01-29 MED ORDER — ETOMIDATE 2 MG/ML IV SOLN: INTRAVENOUS | Status: AC | PRN

## 2021-01-29 MED ORDER — EZETIMIBE 10 MG PO TABS
10.0000 mg | ORAL_TABLET | Freq: Every day | ORAL | Status: DC
  Administered 2021-01-30 – 2021-01-31 (×2): 10 mg
  Filled 2021-01-29 (×3): qty 1

## 2021-01-29 MED ORDER — HYDRALAZINE HCL 20 MG/ML IJ SOLN: 10.0000 mg | INTRAMUSCULAR | Status: AC | PRN

## 2021-01-29 MED ORDER — SODIUM CHLORIDE 0.9% FLUSH
3.0000 mL | Freq: Two times a day (BID) | INTRAVENOUS | Status: DC
  Administered 2021-01-29 – 2021-01-31 (×5): 3 mL via INTRAVENOUS

## 2021-01-29 MED ORDER — LIDOCAINE HCL (PF) 1 % IJ SOLN
INTRAMUSCULAR | Status: DC | PRN
  Administered 2021-01-29: 2 mL

## 2021-01-29 MED ORDER — MIDAZOLAM HCL 2 MG/2ML IJ SOLN
INTRAMUSCULAR | Status: AC
  Administered 2021-01-29: 2 mg via INTRAVENOUS
  Filled 2021-01-29: qty 2

## 2021-01-29 MED ORDER — HEPARIN (PORCINE) 25000 UT/250ML-% IV SOLN
1150.0000 [IU]/h | INTRAVENOUS | Status: DC
  Administered 2021-01-30: 950 [IU]/h via INTRAVENOUS
  Administered 2021-01-31: 05:00:00 1150 [IU]/h via INTRAVENOUS
  Filled 2021-01-29 (×2): qty 250

## 2021-01-29 MED ORDER — FENTANYL 2500MCG IN NS 250ML (10MCG/ML) PREMIX INFUSION
INTRAVENOUS | Status: AC
  Administered 2021-01-29: 100 ug/h via INTRAVENOUS
  Filled 2021-01-29: qty 250

## 2021-01-29 MED ORDER — SODIUM CHLORIDE 0.9 % IV SOLN: 250.0000 mL | INTRAVENOUS | Status: DC | PRN

## 2021-01-29 MED ORDER — FUROSEMIDE 10 MG/ML IJ SOLN
INTRAMUSCULAR | Status: DC | PRN
  Administered 2021-01-29: 80 mg via INTRAVENOUS

## 2021-01-29 MED ORDER — ATORVASTATIN CALCIUM 20 MG PO TABS
80.0000 mg | ORAL_TABLET | Freq: Every day | ORAL | Status: DC
  Administered 2021-01-29 – 2021-01-31 (×3): 80 mg
  Filled 2021-01-29 (×3): qty 4

## 2021-01-29 MED ORDER — FENTANYL 2500MCG IN NS 250ML (10MCG/ML) PREMIX INFUSION
25.0000 ug/h | INTRAVENOUS | Status: DC
  Administered 2021-01-29: 125 ug/h via INTRAVENOUS
  Administered 2021-01-30 – 2021-01-31 (×2): 150 ug/h via INTRAVENOUS
  Filled 2021-01-29 (×2): qty 250

## 2021-01-29 MED ORDER — ETOMIDATE 2 MG/ML IV SOLN: 20.0000 mg | Freq: Once | INTRAVENOUS | Status: AC

## 2021-01-29 MED ORDER — POTASSIUM CHLORIDE CRYS ER 20 MEQ PO TBCR: 40.0000 meq | EXTENDED_RELEASE_TABLET | Freq: Once | ORAL | Status: DC

## 2021-01-29 SURGICAL SUPPLY — 13 items
CATH INFINITI 5 FR IM (CATHETERS) ×2 IMPLANT
CATH INFINITI 5 FR MPA2 (CATHETERS) ×2 IMPLANT
CATH INFINITI 5FR JL4 (CATHETERS) ×2 IMPLANT
DEVICE RAD TR BAND REGULAR (VASCULAR PRODUCTS) ×2 IMPLANT
DRAPE BRACHIAL (DRAPES) ×2 IMPLANT
GLIDESHEATH SLEND SS 6F .021 (SHEATH) ×2 IMPLANT
GUIDEWIRE INQWIRE 1.5J.035X260 (WIRE) ×1 IMPLANT
INQWIRE 1.5J .035X260CM (WIRE) ×2
KIT SYRINGE INJ CVI SPIKEX1 (MISCELLANEOUS) ×2 IMPLANT
PACK CARDIAC CATH (CUSTOM PROCEDURE TRAY) ×4 IMPLANT
PROTECTION STATION PRESSURIZED (MISCELLANEOUS) ×2
SET ATX SIMPLICITY (MISCELLANEOUS) ×2 IMPLANT
STATION PROTECTION PRESSURIZED (MISCELLANEOUS) ×1 IMPLANT

## 2021-01-29 NOTE — Progress Notes (Signed)
Code STEMI. Chaplain provided wife and daughter with supportive presence during event. Wife shared they have been together for nearly 50 years and raised 5 children, all live in the area. She reports Najib "Maxcine Ham" takes care of her, driving her to dialysis 3x a week for 8 years. She reports he has been well and has not reported any recent health concerns. Daughter works within the hospital system. Family is working well together to support one another. Chaplain offered family prayer. Spiritual care support continues to be available upon request.     02/04/2021 2000  Clinical Encounter Type  Visited With Patient and family together  Visit Type Critical Care;ED;Initial;Spiritual support  Referral From Nurse  Spiritual Encounters  Spiritual Needs Emotional;Prayer

## 2021-01-29 NOTE — H&P (Addendum)
NAME:  Benjamin Chan, MRN:  [id number], DOB:  13-Jul-1945, LOS: 0 ADMISSION DATE:  Feb 28, 2021, CONSULTATION DATE: 2021/02/28 REFERRING MD: Delton Prairie, MD, CHIEF COMPLAINT: Chest pain, MI  History of Present Illness:  75 year old with history of hypertension, diabetes, CHF, coronary artery disease status post CABG, hyperlipidemia, BPH, CKD.  Presenting with chest pain, inferior ST elevation MI.  He was intubated in the emergency room.  Evaluated by cardiology and taken to the Cath Lab from the ED  Pertinent  Medical History   Past Medical History:  Diagnosis Date   Arthritis    shoulder   BPH (benign prostatic hyperplasia)    CAD (coronary artery disease)    Chronic kidney disease    COPD (chronic obstructive pulmonary disease) (HCC)    Diabetes mellitus without complication (HCC)    Diabetic ketoacidosis without coma associated with type 2 diabetes mellitus (HCC)    Heart attack (HCC) 06/2019   Hypertension    MI (myocardial infarction) (HCC) 03/2017   Non-ST elevation myocardial infarction (NSTEMI), subendocardial infarction, subsequent episode of care [facility]) 04/14/2017   NSTEMI (non-ST elevated myocardial infarction) (HCC) 07/14/2019     Significant Hospital Events: Including procedures, antibiotic start and stop dates in addition to other pertinent events   11/15 admit, intubation.  Cardiac cath  Interim History / Subjective:    Objective   Blood pressure 123/83, pulse 96, temperature (!) 96.7 F (35.9 C), temperature source Axillary, resp. rate (!) 26, height 5\' 6"  (1.676 m), weight 88.3 kg, SpO2 94 %.    Vent Mode: AC FiO2 (%):  [100 %] 100 % Set Rate:  [26 bmp] 26 bmp Vt Set:  [450 mL] 450 mL PEEP:  [5 cmH20] 5 cmH20  No intake or output data in the 24 hours ending 02/28/2021 1944 Filed Weights   February 28, 2021 1619 02-28-21 1743  Weight: 88.3 kg 88.3 kg    Examination: Gen:      No acute distress HEENT:  EOMI, sclera anicteric Neck:     No masses; no thyromegaly,  ETT Lungs:    Clear to auscultation bilaterally; normal respiratory effort CV:         Regular rate and rhythm; no murmurs Abd:      + bowel sounds; soft, non-tender; no palpable masses, no distension Ext:    No edema; adequate peripheral perfusion Skin:      Warm and dry; no rash Neuro: Sedated  Lab/imaging reviewed Potassium 3.3, creatinine 1.91 BNP 642, troponin 103, lactic acid 3.7 WBC 13.9 Chest x-ray with bilateral interstitial opacities suggestive of edema.  Resolved Hospital Problem list     Assessment & Plan:  Inferior ST elevation MI, history of ischemic cardiomyopathy, coronary artery disease Cardiac cath notes reviewed with patent grafts and no obvious culprit lesion He does have elevated LVEDP indicating decompensated heart failure Discussed with Dr. 01/31/21 from cardiology We will place a central line for CVP, SCV O2 monitoring.  If venous sats are low then stop milrinone Wean down Levophed Resume heparin 2 hours after TR band removal Lasix for diuresis Follow lactic acid  Acute encephalopathy CT with no acute abnormalities.  Continue monitoring  Acute hypoxic respiratory failure, history of COPD Increased respiratory rate as ABG shows acidosis Bronchodilators Follow intermittent chest x-ray  Chronic kidney disease Follow creatinine  Diabetes SSI coverage  Wife updated at bedside  Best Practice (right click and "Reselect all SmartList Selections" daily)   Diet/type: NPO DVT prophylaxis: systemic heparin GI prophylaxis: PPI Lines: Central line  Foley:  Yes, and it is still needed Code Status:  full code Last date of multidisciplinary goals of care discussion []   Labs   CBC: Recent Labs  Lab 2021-02-12 1640  WBC 13.9*  NEUTROABS 8.2*  HGB 14.7  HCT 44.2  MCV 91.9  PLT 284    Basic Metabolic Panel: Recent Labs  Lab 02/12/21 1640  NA 136  K 3.3*  CL 104  CO2 22  GLUCOSE 192*  BUN 32*  CREATININE 1.91*  CALCIUM 8.6*  MG 2.5*    GFR: Estimated Creatinine Clearance: 33.7 mL/min (A) (by C-G formula based on SCr of 1.91 mg/dL (H)). Recent Labs  Lab 2021/02/12 1640  WBC 13.9*  LATICACIDVEN 3.7*    Liver Function Tests: Recent Labs  Lab 02-12-21 1640  AST 31  ALT 24  ALKPHOS 82  BILITOT 0.9  PROT 7.8  ALBUMIN 4.2   No results for input(s): LIPASE, AMYLASE in the last 168 hours. No results for input(s): AMMONIA in the last 168 hours.  ABG    Component Value Date/Time   PHART 7.32 (L) 02-12-2021 1643   PCO2ART 41 February 12, 2021 1643   PO2ART 170 (H) 12-Feb-2021 1643   HCO3 21.1 02-12-2021 1643   ACIDBASEDEF 4.8 (H) 02-12-2021 1643   O2SAT 99.4 02/12/2021 1643     Coagulation Profile: Recent Labs  Lab 2021-02-12 1640  INR 1.1    Cardiac Enzymes: No results for input(s): CKTOTAL, CKMB, CKMBINDEX, TROPONINI in the last 168 hours.  HbA1C: HB A1C (BAYER DCA - WAIVED)  Date/Time Value Ref Range Status  01/05/2020 03:28 PM 6.6 <7.0 % Final    Comment:                                          Diabetic Adult            <7.0                                       Healthy Adult        4.3 - 5.7                                                           (DCCT/NGSP) American Diabetes Association's Summary of Glycemic Recommendations for Adults with Diabetes: Hemoglobin A1c <7.0%. More stringent glycemic goals (A1c <6.0%) may further reduce complications at the cost of increased risk of hypoglycemia.   04/07/2018 10:24 AM 11.4 (H) <7.0 % Final    Comment:                                          Diabetic Adult            <7.0                                       Healthy Adult        4.3 - 5.7                                                           (  DCCT/NGSP) American Diabetes Association's Summary of Glycemic Recommendations for Adults with Diabetes: Hemoglobin A1c <7.0%. More stringent glycemic goals (A1c <6.0%) may further reduce complications at the cost of increased risk of hypoglycemia.     Hgb A1c MFr Bld  Date/Time Value Ref Range Status  10/03/2020 11:44 AM 7.8 (H) 4.8 - 5.6 % Final    Comment:             Prediabetes: 5.7 - 6.4          Diabetes: >6.4          Glycemic control for adults with diabetes: <7.0   07/04/2020 08:37 AM 8.9 (H) 4.8 - 5.6 % Final    Comment:             Prediabetes: 5.7 - 6.4          Diabetes: >6.4          Glycemic control for adults with diabetes: <7.0     CBG: No results for input(s): GLUCAP in the last 168 hours.  Review of Systems:   Unable to obtain as patient is intubated  Past Medical History:  He,  has a past medical history of Arthritis, BPH (benign prostatic hyperplasia), CAD (coronary artery disease), Chronic kidney disease, COPD (chronic obstructive pulmonary disease) (HCC), Diabetes mellitus without complication (HCC), Diabetic ketoacidosis without coma associated with type 2 diabetes mellitus (HCC), Heart attack (HCC) (06/2019), Hypertension, MI (myocardial infarction) (HCC) (03/2017), Non-ST elevation myocardial infarction (NSTEMI), subendocardial infarction, subsequent episode of care [facility]) (04/14/2017), and NSTEMI (non-ST elevated myocardial infarction) (HCC) (07/14/2019).   Surgical History:   Past Surgical History:  Procedure Laterality Date   COLONOSCOPY WITH PROPOFOL N/A 05/02/2019   Procedure: COLONOSCOPY WITH PROPOFOL;  Surgeon: Midge Minium, MD;  Location: [facility];  Service: Endoscopy;  Laterality: N/A;  Diabetic - insulin and oral meds   CORONARY ARTERY BYPASS GRAFT  2006   2 vessel   CORONARY STENT INTERVENTION N/A 03/30/2017   Procedure: CORONARY STENT INTERVENTION;  Surgeon: Alwyn Pea, MD;  Location: [facility];  Service: Cardiovascular;  Laterality: N/A;   INGUINAL HERNIA REPAIR     right   LEFT HEART CATH AND CORONARY ANGIOGRAPHY N/A 07/14/2019   Procedure: LEFT HEART CATH AND CORONARY ANGIOGRAPHY;  Surgeon: Dalia Heading, MD;  Location: [facility];  Service:  Cardiovascular;  Laterality: N/A;   LEFT HEART CATH AND CORS/GRAFTS ANGIOGRAPHY N/A 03/30/2017   Procedure: LEFT HEART CATH AND CORS/GRAFTS ANGIOGRAPHY;  Surgeon: Lamar Blinks, MD;  Location: [facility];  Service: Cardiovascular;  Laterality: N/A;     Social History:   reports that he quit smoking about 12 years ago. His smoking use included cigarettes. He has never used smokeless tobacco. He reports that he does not drink alcohol and does not use drugs.   Family History:  His family history includes Hypertension in his brother, brother, brother, brother, brother, father, mother, sister, sister, sister, sister, sister, and sister.   Allergies Allergies  Allergen Reactions   Brilinta [Ticagrelor] Shortness Of Breath    D/c by cardiology     Home Medications  Prior to Admission medications   Medication Sig Start Date End Date Taking? Authorizing Provider  acetaminophen (TYLENOL) 325 MG tablet Take 650 mg by mouth every 6 (six) hours as needed.    [provider]  albuterol (VENTOLIN HFA) 108 (90 Base) MCG/ACT inhaler Inhale 2 puffs into the lungs every 6 (six) hours as needed  for wheezing or shortness of breath. 12/11/20   Larae Grooms, NP  ASPIRIN LOW DOSE 81 MG EC tablet TAKE ONE TABLET BY MOUTH ONCE DAILY 11/14/20   Larae Grooms, NP  atorvastatin (LIPITOR) 80 MG tablet TAKE ONE TABLET BY MOUTH AT BEDTIME 10/10/20   Larae Grooms, NP  clopidogrel (PLAVIX) 75 MG tablet TAKE ONE TABLET BY MOUTH EVERY MORNING 09/18/20   Larae Grooms, NP  Dulaglutide (TRULICITY) 0.75 MG/0.5ML SOPN Inject 0.75 mg into the skin once a week. Taking on Wednesdays    [provider]  ezetimibe (ZETIA) 10 MG tablet TAKE ONE TABLET BY MOUTH ONCE DAILY 11/14/20   Larae Grooms, NP  finasteride (PROSCAR) 5 MG tablet Take 1 tablet (5 mg total) by mouth daily. 12/11/20   Larae Grooms, NP  fluticasone (FLONASE) 50 MCG/ACT nasal spray Place 1 spray into both nostrils 2  (two) times daily. Pt takes twice a day 12/11/20   Larae Grooms, NP  Fluticasone-Umeclidin-Vilant (TRELEGY ELLIPTA) 100-62.5-25 MCG/INH AEPB Inhale 1 puff into the lungs daily. 08/03/20   Larae Grooms, NP  furosemide (LASIX) 40 MG tablet Take 60 mg by mouth daily. 12/28/19   [provider]  glipiZIDE (GLUCOTROL) 5 MG tablet TAKE ONE TABLET BY MOUTH EVERY MORNING 10/10/20   Larae Grooms, NP  Insulin Glargine (BASAGLAR KWIKPEN) 100 UNIT/ML Inject 40 Units into the skin in the morning.    [provider]  Insulin Pen Needle (PEN NEEDLES 5/16") 30G X 8 MM MISC 1 each by Does not apply route daily. 02/21/20   Johnson, Megan P, DO  JARDIANCE 25 MG TABS tablet TAKE ONE TABLET BY MOUTH ONCE DAILY 08/16/20   Laural Benes, Megan P, DO  losartan (COZAAR) 100 MG tablet Take 1 tablet (100 mg total) by mouth daily. 07/26/20 01/30/21  Larae Grooms, NP  metFORMIN (GLUCOPHAGE-XR) 500 MG 24 hr tablet Take 2 tablets (1,000 mg total) by mouth 2 (two) times daily. 07/26/20   Larae Grooms, NP  metoprolol succinate (TOPROL-XL) 25 MG 24 hr tablet  12/29/19   [provider]  metoprolol succinate (TOPROL-XL) 50 MG 24 hr tablet Take by mouth. 08/20/20 08/20/21  [provider]  Multiple Vitamin (MULTIVITAMIN) TABS TAKE ONE TABLET BY MOUTH ONCE DAILY 07/23/20   Larae Grooms, NP  nitroGLYCERIN (NITROSTAT) 0.4 MG SL tablet Place 1 tablet (0.4 mg total) under the tongue every 5 (five) minutes as needed for chest pain. 07/26/20   Larae Grooms, NP  OneTouch Delica Lancets 30G MISC 1 each by Does not apply route daily. 01/06/20   Johnson, Megan P, DO  spironolactone (ALDACTONE) 25 MG tablet Take 1 tablet (25 mg total) by mouth daily. 07/26/20 01/30/21  Larae Grooms, NP     Critical care time:    The patient is critically ill with multiple organ system failure and requires high complexity decision making for assessment and support, frequent evaluation and titration of  therapies, advanced monitoring, review of radiographic studies and interpretation of complex data.   Critical Care Time devoted to patient care services, exclusive of separately billable procedures, described in this note is 45 minutes.   Chilton Greathouse MD [facility] See Amion for pager  If no response to pager , please call [phone] until 7pm After 7:00 pm call Elink  [phone] 02/11/2021, 10:05 PM

## 2021-01-29 NOTE — Progress Notes (Addendum)
ANTICOAGULATION CONSULT NOTE  Pharmacy Consult for heparin Indication: chest pain/ACS  Allergies  Allergen Reactions   Brilinta [Ticagrelor] Shortness Of Breath    D/c by cardiology    Patient Measurements: Height: 5\' 6"  (167.6 cm) Weight: 88.3 kg (194 lb 10.7 oz) IBW/kg (Calculated) : 63.8 Heparin Dosing Weight: 82 kg  Vital Signs: Temp: 96.7 F (35.9 C) (11/15 1743) Temp Source: Axillary (11/15 1743) BP: 129/82 (11/15 1741) Pulse Rate: 96 (11/15 1741)  Labs: Recent Labs    02/17/21 1640  HGB 14.7  HCT 44.2  PLT 284  APTT 27  LABPROT 13.7  INR 1.1  CREATININE 1.91*  TROPONINIHS 103*    Estimated Creatinine Clearance: 33.7 mL/min (A) (by C-G formula based on SCr of 1.91 mg/dL (H)).   Medical History: Past Medical History:  Diagnosis Date   Arthritis    shoulder   BPH (benign prostatic hyperplasia)    CAD (coronary artery disease)    Chronic kidney disease    COPD (chronic obstructive pulmonary disease) (HCC)    Diabetes mellitus without complication (HCC)    Diabetic ketoacidosis without coma associated with type 2 diabetes mellitus (HCC)    Heart attack (HCC) 06/2019   Hypertension    MI (myocardial infarction) (HCC) 03/2017   Non-ST elevation myocardial infarction (NSTEMI), subendocardial infarction, subsequent episode of care [facility]) 04/14/2017   NSTEMI (non-ST elevated myocardial infarction) (HCC) 07/14/2019     Assessment: 75 year old male brought to the ED after being found unresponsive. Pharmacy consulted for heparin management for ACS. No anticoagulation noted PTA.  Goal of Therapy:  Heparin level 0.3-0.7 units/ml Monitor platelets by anticoagulation protocol: Yes   Plan:  Heparin 4000 unit bolus given prior to orders being placed Start heparin infusion at 950 units/hr Check HL 11/16 at 0300 CBC daily while on heparin  Addendum 2100: Patient s/p cath. Consult to restart heparin 2 hours post TR band removal. Spoke with ICU nurse. Expect TR  band to be removed ~ 2200. Will plan to restart heparin drip at previous rate 11/16 at 0000 and check HL at 0800. RN to call if plan changes.  [id number], PharmD, BCPS Clinical Pharmacist 02-17-21 6:07 PM

## 2021-01-29 NOTE — Consult Note (Signed)
Cardiology Consultation:   Patient ID: Benjamin Chan MRN: [id number]; DOB: Aug 23, 1945  Admit date: 02/11/2021 Date of Consult: 01/31/2021  PCP:  Jon Billings, NP   [facility] Providers Cardiologist:    Serafina Royals, MD   Patient Profile:   Benjamin Chan is a 75 y.o. male with a hx of coronary artery disease status post CABG, chronic HFrEF, hypertension, hyperlipidemia, diabetes mellitus, chronic kidney disease who is being seen 02/13/2021 for the evaluation of possible STEMI at the request of Dr. Tamala Julian.  History of Present Illness:   Benjamin Chan was in his usual state of health per [name]'s report until approximately 3 PM.  He suddenly had substernal chest pain and left arm discomfort.  He took sublingual nitroglycerin x2 without any improvement but then began to feel nauseated and as if he needed to have a bowel movement.  He was able to walk to the bathroom but did not return for some time.  [name] found him slumped over on the toilet, though he was able to communicate a little bit.  She summoned EMS who found the patient minimally responsive.  Nasal airway was placed.  He was transported to [facility] where he was found to be somnolent and ultimately intubated for airway protection.  He became hypotensive requiring initiation of norepinephrine.  Initial EKG showed IVCD.  Subsequent EKGs showed persistent IVCD with up to 1 mm ST elevation in the inferior leads.  Per [name], he had not been complaining of any chest pain or shortness of breath earlier in the day.  His most recent echocardiogram at [facility] in 12/2019 showed an EF of 35% with basal inferior posterior and lateral hypokinesis as well as moderate mitral regurgitation.  Mr. Kumpf was taken for emergent LHC this evening, showing severe but relatively stable CAD compared with 06/2019.  LVEDP was noted to be severely elevated (35 mmHg).   Past Medical History:  Diagnosis Date   Arthritis     shoulder   BPH (benign prostatic hyperplasia)    CAD (coronary artery disease)    Chronic kidney disease    COPD (chronic obstructive pulmonary disease) (HCC)    Diabetes mellitus without complication ([place])    Diabetic ketoacidosis without coma associated with type 2 diabetes mellitus ([place])    Heart attack ([place]) 06/2019   Hypertension    MI (myocardial infarction) ([place]) 03/2017   Non-ST elevation myocardial infarction (NSTEMI), subendocardial infarction, subsequent episode of care [facility]) 04/14/2017   NSTEMI (non-ST elevated myocardial infarction) ([place]) 07/14/2019    Past Surgical History:  Procedure Laterality Date   COLONOSCOPY WITH PROPOFOL N/A 05/02/2019   Procedure: COLONOSCOPY WITH PROPOFOL;  Surgeon: Lucilla Lame, MD;  Location: [place];  Service: Endoscopy;  Laterality: N/A;  Diabetic - insulin and oral meds   CORONARY ARTERY BYPASS GRAFT  2006   2 vessel   CORONARY STENT INTERVENTION N/A 03/30/2017   Procedure: CORONARY STENT INTERVENTION;  Surgeon: Yolonda Kida, MD;  Location: [place] CV LAB;  Service: Cardiovascular;  Laterality: N/A;   INGUINAL HERNIA REPAIR     right   LEFT HEART CATH AND CORONARY ANGIOGRAPHY N/A 07/14/2019   Procedure: LEFT HEART CATH AND CORONARY ANGIOGRAPHY;  Surgeon: Teodoro Spray, MD;  Location: [place] CV LAB;  Service: Cardiovascular;  Laterality: N/A;   LEFT HEART CATH AND CORS/GRAFTS ANGIOGRAPHY N/A 03/30/2017   Procedure: LEFT HEART CATH AND CORS/GRAFTS ANGIOGRAPHY;  Surgeon: Corey Skains, MD;  Location: [facility] INVASIVE CV  LAB;  Service: Cardiovascular;  Laterality: N/A;     Home Medications:  Prior to Admission medications   Medication Sig Start Date [place] Date Taking? Authorizing Provider  acetaminophen (TYLENOL) 325 MG tablet Take 650 mg by mouth every 6 (six) hours as needed.    [provider]  albuterol (VENTOLIN HFA) 108 (90 Base) MCG/ACT inhaler Inhale 2 puffs into the lungs every 6 (six) hours as  needed for wheezing or shortness of breath. 12/11/20   Larae Grooms, NP  ASPIRIN LOW DOSE 81 MG EC tablet TAKE ONE TABLET BY MOUTH ONCE DAILY 11/14/20   Larae Grooms, NP  atorvastatin (LIPITOR) 80 MG tablet TAKE ONE TABLET BY MOUTH AT BEDTIME 10/10/20   Larae Grooms, NP  clopidogrel (PLAVIX) 75 MG tablet TAKE ONE TABLET BY MOUTH EVERY MORNING 09/18/20   Larae Grooms, NP  Dulaglutide (TRULICITY) 0.75 MG/0.5ML SOPN Inject 0.75 mg into the skin once a week. Taking on Wednesdays    [provider]  ezetimibe (ZETIA) 10 MG tablet TAKE ONE TABLET BY MOUTH ONCE DAILY 11/14/20   Larae Grooms, NP  finasteride (PROSCAR) 5 MG tablet Take 1 tablet (5 mg total) by mouth daily. 12/11/20   Larae Grooms, NP  fluticasone (FLONASE) 50 MCG/ACT nasal spray Place 1 spray into both nostrils 2 (two) times daily. Pt takes twice a day 12/11/20   Larae Grooms, NP  Fluticasone-Umeclidin-Vilant (TRELEGY ELLIPTA) 100-62.5-25 MCG/INH AEPB Inhale 1 puff into the lungs daily. 08/03/20   Larae Grooms, NP  furosemide (LASIX) 40 MG tablet Take 60 mg by mouth daily. 12/28/19   [provider]  glipiZIDE (GLUCOTROL) 5 MG tablet TAKE ONE TABLET BY MOUTH EVERY MORNING 10/10/20   Larae Grooms, NP  Insulin Glargine (BASAGLAR KWIKPEN) 100 UNIT/ML Inject 40 Units into the skin in the morning.    [provider]  Insulin Pen Needle (PEN NEEDLES 5/16") 30G X 8 MM MISC 1 each by Does not apply route daily. 02/21/20   Johnson, Megan P, DO  JARDIANCE 25 MG TABS tablet TAKE ONE TABLET BY MOUTH ONCE DAILY 08/16/20   Laural Benes, Megan P, DO  losartan (COZAAR) 100 MG tablet Take 1 tablet (100 mg total) by mouth daily. 07/26/20 01/30/21  Larae Grooms, NP  metFORMIN (GLUCOPHAGE-XR) 500 MG 24 hr tablet Take 2 tablets (1,000 mg total) by mouth 2 (two) times daily. 07/26/20   Larae Grooms, NP  metoprolol succinate (TOPROL-XL) 25 MG 24 hr tablet  12/29/19   [provider]  metoprolol  succinate (TOPROL-XL) 50 MG 24 hr tablet Take by mouth. 08/20/20 08/20/21  [provider]  Multiple Vitamin (MULTIVITAMIN) TABS TAKE ONE TABLET BY MOUTH ONCE DAILY 07/23/20   Larae Grooms, NP  nitroGLYCERIN (NITROSTAT) 0.4 MG SL tablet Place 1 tablet (0.4 mg total) under the tongue every 5 (five) minutes as needed for chest pain. 07/26/20   Larae Grooms, NP  OneTouch Delica Lancets 30G MISC 1 each by Does not apply route daily. 01/06/20   Johnson, Megan P, DO  spironolactone (ALDACTONE) 25 MG tablet Take 1 tablet (25 mg total) by mouth daily. 07/26/20 01/30/21  Larae Grooms, NP    Inpatient Medications: Scheduled Meds:  midazolam       atorvastatin  80 mg Per Tube QHS   [START ON 01/30/2021] Chlorhexidine Gluconate Cloth  6 each Topical Q0600   [START ON 01/30/2021] clopidogrel  75 mg Per Tube Daily   [START ON 01/30/2021] ezetimibe  10 mg Per Tube Daily   fentaNYL (SUBLIMAZE) injection  100 mcg Intravenous Once   fentaNYL (SUBLIMAZE) injection  25 mcg Intravenous Once   [START ON 01/30/2021] finasteride  5 mg Oral Daily   [START ON 01/30/2021] furosemide  80 mg Intravenous BID   potassium chloride  40 mEq Per Tube Once   rocuronium bromide  100 mg Intravenous Once   sodium chloride flush  3 mL Intravenous Q12H   Continuous Infusions:  sodium chloride     fentaNYL infusion INTRAVENOUS     [START ON 01/30/2021] heparin     norepinephrine (LEVOPHED) Adult infusion     propofol (DIPRIVAN) infusion     PRN Meds: sodium chloride, acetaminophen, fentaNYL, hydrALAZINE, midazolam, ondansetron (ZOFRAN) IV, sodium chloride flush  Allergies:    Allergies  Allergen Reactions   Brilinta [Ticagrelor] Shortness Of Breath    D/c by cardiology    Social History:   Social History   Socioeconomic History   Marital status: Married    Spouse name: Not on file   Number of children: Not on file   Years of education: Not on file   Highest education level: 9th grade   Occupational History   Occupation: retired  Tobacco Use   Smoking status: Former    Years: 40.00    Types: Cigarettes    Quit date: 2010    Years since quitting: 12.8   Smokeless tobacco: Never  Vaping Use   Vaping Use: Never used  Substance and Sexual Activity   Alcohol use: No   Drug use: No   Sexual activity: Yes  Other Topics Concern   Not on file  Social History Narrative   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   Social Determinants of Radio broadcast assistant Strain: Low Risk    Difficulty of Paying Living Expenses: Not hard at all  Food Insecurity: No Food Insecurity   Worried About Charity fundraiser in the Last Year: Never true   Arboriculturist in the Last Year: Never true  Transportation Needs: No Transportation Needs   Lack of Transportation (Medical): No   Lack of Transportation (Non-Medical): No  Physical Activity: Inactive   Days of Exercise per Week: 0 days   Minutes of Exercise per Session: 0 min  Stress: No Stress Concern Present   Feeling of Stress : Not at all  Social Connections: Not on file  Intimate Partner Violence: Not on file    Family History:   Family History  Problem Relation Age of Onset   Hypertension Father    Hypertension Mother    Hypertension Sister    Hypertension Brother    Hypertension Brother    Hypertension Brother    Hypertension Brother    Hypertension Brother    Hypertension Sister    Hypertension Sister    Hypertension Sister    Hypertension Sister    Hypertension Sister      ROS:  Unable to obtain due to acuity of illness and mechanical ventilation.  Physical Exam/Data:   Vitals:   01/19/2021 1810 02/04/2021 2100 01/23/2021 2101 02/08/2021 2115  BP: 123/83 99/74  93/71  Pulse:  99  96  Resp: (!) 26 (!) 26  (!) 26  Temp:  97.9 F (36.6 C)    TempSrc:  Axillary    SpO2:  96% 96% 99%  Weight:      Height:       No intake or output data in the 24 hours ending 02/05/2021 2129 Last 3  Weights  02/12/2021 01/26/2021 10/22/2020  Weight (lbs) 194 lb 10.7 oz 194 lb 10.7 oz 198 lb  Weight (kg) 88.3 kg 88.3 kg 89.812 kg     Body mass index is 31.42 kg/m.  General: Intubated and sedated. HEENT: ET tube in place Neck: Unable to assess JVP due to support devices in patient positioning. Vascular: No carotid bruits; 2+ right femoral and 1+ left radial pulses. Cardiac: Regular rate and rhythm with 3/6 holosystolic murmur across the precordium. Lungs:  clear anteriorly. Abd: soft, nontender, no hepatomegaly  Ext: no edema Musculoskeletal:  No deformities Skin: warm and dry  Neuro: Intubated and sedated; unresponsive Psych: Unable to assess as patient is intubated and sedated.  EKG:  The EKG was personally reviewed and demonstrates: Normal sinus rhythm with IVCD and subtle inferior ST segment elevation. Telemetry:  Telemetry was personally reviewed and demonstrates: Sinus rhythm  Relevant CV Studies: Limited bedside echocardiogram shows LVEF of 30-35% with inferior/posterior aneurysm (cannot exclude pseudoaneurysm).  Severe mitral regurgitation noted.  No pericardial effusion.  Laboratory Data:  High Sensitivity Troponin:   Recent Labs  Lab 01/16/2021 1640  TROPONINIHS 103*     Chemistry Recent Labs  Lab 01/28/2021 1640  NA 136  K 3.3*  CL 104  CO2 22  GLUCOSE 192*  BUN 32*  CREATININE 1.91*  CALCIUM 8.6*  MG 2.5*  GFRNONAA 36*  ANIONGAP 10    Recent Labs  Lab 02/13/2021 1640  PROT 7.8  ALBUMIN 4.2  AST 31  ALT 24  ALKPHOS 82  BILITOT 0.9   Lipids No results for input(s): CHOL, TRIG, HDL, LABVLDL, LDLCALC, CHOLHDL in the last 168 hours.  Hematology Recent Labs  Lab 02/11/2021 1640  WBC 13.9*  RBC 4.81  HGB 14.7  HCT 44.2  MCV 91.9  MCH 30.6  MCHC 33.3  RDW 12.8  PLT 284   Thyroid No results for input(s): TSH, FREET4 in the last 168 hours.  BNP Recent Labs  Lab 01/19/2021 1640  BNP 642.5*    DDimer No results for input(s): DDIMER in the last 168  hours.   Radiology/Studies:  CT HEAD WO CONTRAST (5MM)  Result Date: 01/26/2021 CLINICAL DATA:  Found unresponsive. EXAM: CT HEAD WITHOUT CONTRAST TECHNIQUE: Contiguous axial images were obtained from the base of the skull through the vertex without intravenous contrast. COMPARISON:  None. FINDINGS: Brain: No evidence of acute infarction, hemorrhage, hydrocephalus, extra-axial collection or mass lesion/mass effect. Scattered areas of low-density in the white matter is suggestive for chronic small vessel ischemic changes. Vascular: No hyperdense vessel or unexpected calcification. Skull: Normal. Negative for fracture or focal lesion. Sinuses/Orbits: Mucosal thickening in the ethmoid air cells. Mastoid air cells are aerated. There is probably fluid and soft tissue fullness in the posterior nasal cavity. Other: None. IMPRESSION: 1. No acute intracranial abnormality. 2. Evidence for chronic small vessel ischemic changes. Electronically Signed   By: Markus Daft M.D.   On: 02/11/2021 17:12   CARDIAC CATHETERIZATION  Result Date: 01/22/2021 Conclusions: Severe native coronary artery disease that is very similar to prior diagnostic catheterization from 06/2019.  This includes i95% proximal LAD stenosis, chronic total occlusion of the proximal LCx, and sequential 70% and 30% mid RCA stenoses. Patent LIMA-LAD with 20% mid graft stenosis. Patent SVG-D2 with 30% proximal graft disease. Chronically occluded SVG-OM2. Severely elevated left ventricular filling pressure (LVEDP ~35 mmHg). Recommendations: No obvious culprit for patient acute angina and subtle inferior ST elevation.  I suspect his symptoms and EKG findings may be  due to acute on chronic HFrEF complicated by worsening mitral regurgitation noted on bedside echocardiogram this evening. Wean norepinephrine as tolerated. Establish central venous access in ICU and obtain central venous O2 saturation.  Consider adding milrinone if SvO2 < 70%. Aggressive diuresis.  Nelva Bush, MD [facility]  DG Chest Portable 1 View  Result Date: 01/20/2021 CLINICAL DATA:  Intubation, chest pain EXAM: PORTABLE CHEST 1 VIEW COMPARISON:  Chest radiograph 01/05/2020 FINDINGS: The endotracheal tube tip is approximately 3.5 cm from the carina. Median sternotomy wires and mediastinal surgical clips are again seen. The heart is enlarged, unchanged. The upper mediastinal contours are within normal limits. There are increased interstitial opacities throughout both lungs. There is no focal consolidation. There is no pleural effusion or pneumothorax. There is no acute osseous abnormality. IMPRESSION: 1. Increased interstitial opacities throughout both lungs favored to reflect pulmonary interstitial edema with differential including viral/atypical infection. 2. Unchanged cardiomegaly. Electronically Signed   By: Valetta Mole M.D.   On: 02/05/2021 17:04     Assessment and Plan:   Inferior STEMI: EKG findings borderline but history certainly concerning for ACS.  Cath this evening with stable CAD but severely elevated LVEDP.  Question if EKG changes and symptoms represent supply-demand mismatch. -Admit to ICU on CCM service.  We will transfer continuing cardiology care to [place] tomorrow morning. -Trend HS-TnI until peaked. -Restart IV heparin 2 hours after TR band removal; consider continuing up to 48 hours for medical management of ACS. -Continue long-term DAPT. -Based on clinical course, could consider functional study to assess hemodynamic significance of RCA disease to guide PCI. -Aggressive secondary prevention, including statin and ezetimibe therapy.  Acute HFrEF and severe mitral regurgitation and cardiogenic shock: Bedside echo showed LVEF ~30-35%, similar to prior study at [facility] a year ago.  There is concern for basal inferolateral aneurysm (or less likely pseudoaneurysm) as well as severe MR. -Wean norepinephrine for MAP > 60. -Obtain central venous  access and check central venous O2 sat.  Consider addition of low-dose milrinone if SvO2 < 70%. -Continue furosemide 80 mg IV BID for diuresis. -If patient declines acutely, consider STAT echo to rule out LV perforation and consider IABP placement and transfer to tertiary care center. -BB, ARB, and spironolactone on hold in the setting of shock.  Altered mental status: Most likely due to cardiogenic shock, requiring intubation for airway protection. -Wean vent support per CCM.  CKD stage 3b: -Diurese as above with inotrope support if needed. -Avoid nephrotoxic drugs. -Trend creatinine in setting of contrast exposure and ongoing diuresis.  DM: -Per CCM.  CRITICAL CARE Performed by: Nelva Bush, MD  Total critical care time: 45 minutes  Critical care time was exclusive of separately billable procedures and treating other patients.  Critical care was necessary to treat or prevent imminent or life-threatening deterioration.  Critical care was time spent personally by me (independent of midlevel providers or residents) on the following activities: development of treatment plan with patient and/or surrogate as well as nursing, discussions with consultants, evaluation of patient's response to treatment, examination of patient, obtaining history from patient or surrogate, ordering and performing treatments and interventions, ordering and review of laboratory studies, ordering and review of radiographic studies, pulse oximetry and re-evaluation of patient's condition.   For questions or updates, please contact [place] Please consult [email or web address] for contact info under [facility].  Signed, Nelva Bush, MD  01/15/2021 9:29 PM

## 2021-01-29 NOTE — ED Triage Notes (Signed)
Pt arrives via [facility].  Found unresponsive in bathroom, actively wretching, no emesis.

## 2021-01-29 NOTE — ED Provider Notes (Signed)
[facility] Emergency Department Provider Note ____________________________________________   Event Date/Time   First MD Initiated Contact with Patient 02/11/2021 1637     (approximate)  I have reviewed the triage vital signs and the nursing notes.  HISTORY  Chief Complaint Unresponsive   HPI Benjamin Chan is a 75 y.o. malewho presents to the ED for evaluation of poor responsiveness.   Chart review indicates HTN, DM, CHF, CAD s/p cabg. HLD and BPH. CKD3B.   Patient presents to the ED via EMS from home for evaluation of chest pain and poor responsiveness.  EMS reports finding him at home, diaphoretic, complaining of chest pain, retching and found to be hypoxic to the 80s on room air requiring nasal cannula.  Patient unable to provide any relevant history on arrival to the ED due to his poor responsiveness.  GCS is around 8.  He repeatedly says his own name, Benjamin Chan, but does not follow commands and cannot answer questions.  Further history later obtained by the wife after initial stabilization.  Wife reports that he was totally fine up until this afternoon around 3 PM when he suddenly developed diaphoresis, crushing chest pain and right arm pain while seated watching television.  He took a nitroglycerin with some abatement, then went to the restroom because he was feeling nauseous.  Wife found him slumped over and poorly responsive by the toilet a few minutes later. He seems rather functional at baseline.  Independently ambulatory, helping his wife who is a dialysis patient, performing much of the chores and tasks around the house.  Past Medical History:  Diagnosis Date   Arthritis    shoulder   BPH (benign prostatic hyperplasia)    CAD (coronary artery disease)    Chronic kidney disease    COPD (chronic obstructive pulmonary disease) (HCC)    Diabetes mellitus without complication ([place])    Diabetic ketoacidosis without coma associated with type 2  diabetes mellitus ([place])    Heart attack ([place]) 06/2019   Hypertension    MI (myocardial infarction) ([place]) 03/2017   Non-ST elevation myocardial infarction (NSTEMI), subendocardial infarction, subsequent episode of care ([place]) 04/14/2017   NSTEMI (non-ST elevated myocardial infarction) ([place]) 07/14/2019    Patient Active Problem List   Diagnosis Date Noted   Aortic atherosclerosis ([place]) 06/06/2020   Chronic congestive heart failure ([place]) 01/05/2020   HFrEF (heart failure with reduced ejection fraction) ([place]) 12/26/2019   Special screening for malignant neoplasms, colon    CKD stage 3 due to type 2 diabetes mellitus ([place]) 11/23/2017   Hyperlipidemia 07/10/2017   Venous insufficiency of both lower extremities 06/10/2017   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 heart 04/14/2017   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 classification ([place]) 04/13/2017   BPH (benign prostatic hyperplasia) 04/13/2017   Benign hypertensive renal disease 04/13/2017   Type 2 diabetes mellitus with diabetic chronic kidney disease ([place]) 04/13/2017   History of myocardial infarction 04/13/2017   Diabetic neuropathy ([place]) 06/12/2016   Hypertension associated with diabetes ([place]) 08/26/2012   ASD (atrial septal defect), ostium secundum 10/24/1997    Past Surgical History:  Procedure Laterality Date   COLONOSCOPY WITH PROPOFOL N/A 05/02/2019   Procedure: COLONOSCOPY WITH PROPOFOL;  Surgeon: Lucilla Lame, MD;  Location: [place];  Service: Endoscopy;  Laterality: N/A;  Diabetic - insulin and oral meds   CORONARY ARTERY BYPASS GRAFT  2006   2 vessel   CORONARY STENT INTERVENTION N/A 03/30/2017   Procedure: CORONARY  STENT INTERVENTION;  Surgeon: Yolonda Kida, MD;  Location: [place] [facility];  Service: Cardiovascular;  Laterality: N/A;   INGUINAL HERNIA REPAIR     right   LEFT HEART CATH AND CORONARY ANGIOGRAPHY N/A 07/14/2019   Procedure: LEFT HEART CATH AND CORONARY ANGIOGRAPHY;   Surgeon: Teodoro Spray, MD;  Location: [place] [facility];  Service: Cardiovascular;  Laterality: N/A;   LEFT HEART CATH AND CORS/GRAFTS ANGIOGRAPHY N/A 03/30/2017   Procedure: LEFT HEART CATH AND CORS/GRAFTS ANGIOGRAPHY;  Surgeon: Corey Skains, MD;  Location: [place] [facility];  Service: Cardiovascular;  Laterality: N/A;    Prior to Admission medications   Medication Sig Start Date End Date Taking? Authorizing Provider  acetaminophen (TYLENOL) 325 MG tablet Take 650 mg by mouth every 6 (six) hours as needed.    [provider]  albuterol (VENTOLIN HFA) 108 (90 Base) MCG/ACT inhaler Inhale 2 puffs into the lungs every 6 (six) hours as needed for wheezing or shortness of breath. 12/11/20   Jon Billings, NP  ASPIRIN LOW DOSE 81 MG EC tablet TAKE ONE TABLET BY MOUTH ONCE DAILY 11/14/20   Jon Billings, NP  atorvastatin (LIPITOR) 80 MG tablet TAKE ONE TABLET BY MOUTH AT BEDTIME 10/10/20   Jon Billings, NP  clopidogrel (PLAVIX) 75 MG tablet TAKE ONE TABLET BY MOUTH EVERY MORNING 09/18/20   Jon Billings, NP  Dulaglutide (TRULICITY) [id number] SOPN Inject 0.75 mg into the skin once a week. Taking on Wednesdays    [provider]  ezetimibe (ZETIA) 10 MG tablet TAKE ONE TABLET BY MOUTH ONCE DAILY 11/14/20   Jon Billings, NP  finasteride (PROSCAR) 5 MG tablet Take 1 tablet (5 mg total) by mouth daily. 12/11/20   Jon Billings, NP  fluticasone (FLONASE) 50 MCG/ACT nasal spray Place 1 spray into both nostrils 2 (two) times daily. Pt takes twice a day 12/11/20   Jon Billings, NP  Fluticasone-Umeclidin-Vilant (TRELEGY ELLIPTA) 100-62.5-25 MCG/INH AEPB Inhale 1 puff into the lungs daily. 08/03/20   Jon Billings, NP  furosemide (LASIX) 40 MG tablet Take 60 mg by mouth daily. 12/28/19   [provider]  glipiZIDE (GLUCOTROL) 5 MG tablet TAKE ONE TABLET BY MOUTH EVERY MORNING 10/10/20   Jon Billings, NP  Insulin Glargine (BASAGLAR KWIKPEN)  100 UNIT/ML Inject 40 Units into the skin in the morning.    [provider]  Insulin Pen Needle (PEN NEEDLES 5/16") 30G X 8 MM MISC 1 each by Does not apply route daily. 02/21/20   Johnson, Megan P, DO  JARDIANCE 25 MG TABS tablet TAKE ONE TABLET BY MOUTH ONCE DAILY 08/16/20   Wynetta Emery, Megan P, DO  losartan (COZAAR) 100 MG tablet Take 1 tablet (100 mg total) by mouth daily. 07/26/20 01/30/21  Jon Billings, NP  metFORMIN (GLUCOPHAGE-XR) 500 MG 24 hr tablet Take 2 tablets (1,000 mg total) by mouth 2 (two) times daily. 07/26/20   Jon Billings, NP  metoprolol succinate (TOPROL-XL) 25 MG 24 hr tablet  12/29/19   [provider]  metoprolol succinate (TOPROL-XL) 50 MG 24 hr tablet Take by mouth. 08/20/20 08/20/21  [provider]  Multiple Vitamin (MULTIVITAMIN) TABS TAKE ONE TABLET BY MOUTH ONCE DAILY 07/23/20   Jon Billings, NP  nitroGLYCERIN (NITROSTAT) 0.4 MG SL tablet Place 1 tablet (0.4 mg total) under the tongue every 5 (five) minutes as needed for chest pain. 07/26/20   Jon Billings, NP  OneTouch Delica Lancets [id number] MISC 1 each by Does not apply route  daily. 01/06/20   Johnson, Megan P, DO  spironolactone (ALDACTONE) 25 MG tablet Take 1 tablet (25 mg total) by mouth daily. 07/26/20 01/30/21  Jon Billings, NP    Allergies Brilinta [ticagrelor]  Family History  Problem Relation Age of Onset   Hypertension Father    Hypertension Mother    Hypertension Sister    Hypertension Brother    Hypertension Brother    Hypertension Brother    Hypertension Brother    Hypertension Brother    Hypertension Sister    Hypertension Sister    Hypertension Sister    Hypertension Sister    Hypertension Sister     Social History Social History   Tobacco Use   Smoking status: Former    Years: 40.00    Types: Cigarettes    Quit date: 2010    Years since quitting: 12.8   Smokeless tobacco: Never  Vaping Use   Vaping Use: Never used  Substance Use Topics    Alcohol use: No   Drug use: No    Review of Systems  Unable to be accurately assessed due to poorly responsive patient. ____________________________________________   PHYSICAL EXAM:  VITAL SIGNS: Vitals:   02/11/2021 1805 02/02/2021 1810  BP: 125/81 123/83  Pulse:    Resp: (!) 26 (!) 26  Temp:    SpO2:       Constitutional: Alert , eyes open, moaning and obviously diaphoretic.  Confused, delirious.  Repeatedly tells me his name.  Does not follow any commands. Eyes: Conjunctivae are normal. PERRL. EOMI. Head: Atraumatic. Nose: No congestion/rhinnorhea. Mouth/Throat: Mucous membranes are moist.  Oropharynx non-erythematous. Neck: No stridor. No cervical spine tenderness to palpation. Cardiovascular: Tachycardic rate, regular rhythm.  Murmur present. Respiratory: Tachypneic to the 30s-40s with clear lungs. Gastrointestinal: Soft , nondistended,  Musculoskeletal: No lower extremity edema.  No joint effusions. No signs of acute trauma. Neurologic: GCS8 E4 V3 M1.  No obvious laterality.  No clonus or rigidity. Skin: Diaphoretic without rash Psychiatric: Mood and affect are unable to be assessed  ____________________________________________   LABS (all labs ordered are listed, but only abnormal results are displayed)  Labs Reviewed  COMPREHENSIVE METABOLIC PANEL - Abnormal; Notable for the following components:      Result Value   Potassium 3.3 (*)    Glucose, Bld 192 (*)    BUN 32 (*)    Creatinine, Ser 1.91 (*)    Calcium 8.6 (*)    GFR, Estimated 36 (*)    All other components within normal limits  CBC WITH DIFFERENTIAL/PLATELET - Abnormal; Notable for the following components:   WBC 13.9 (*)    Neutro Abs 8.2 (*)    Lymphs Abs 4.4 (*)    Abs Immature Granulocytes 0.08 (*)    All other components within normal limits  BLOOD GAS, ARTERIAL - Abnormal; Notable for the following components:   pH, Arterial 7.32 (*)    pO2, Arterial 170 (*)    Acid-base deficit 4.8 (*)     All other components within normal limits  MAGNESIUM - Abnormal; Notable for the following components:   Magnesium 2.5 (*)    All other components within normal limits  URINALYSIS, COMPLETE (UACMP) WITH MICROSCOPIC - Abnormal; Notable for the following components:   Color, Urine YELLOW (*)    APPearance CLEAR (*)    Glucose, UA >=500 (*)    All other components within normal limits  LACTIC ACID, PLASMA - Abnormal; Notable for the following components:   Lactic Acid,  Venous 3.7 (*)    All other components within normal limits  BRAIN NATRIURETIC PEPTIDE - Abnormal; Notable for the following components:   B Natriuretic Peptide 642.5 (*)    All other components within normal limits  TROPONIN I (HIGH SENSITIVITY) - Abnormal; Notable for the following components:   Troponin I (High Sensitivity) 103 (*)    All other components within normal limits  RESP PANEL BY RT-PCR (FLU A&B, COVID) ARPGX2  CULTURE, BLOOD (SINGLE)  PROTIME-INR  APTT  LACTIC ACID, PLASMA  PROCALCITONIN  HEPARIN LEVEL (UNFRACTIONATED)  CBC  PROCALCITONIN  TROPONIN I (HIGH SENSITIVITY)   ____________________________________________  12 Lead EKG  First EKG with a sinus rhythm, rate of 106 bpm.  Normal axis.  Incomplete left bundle.  No clear evidence of acute ischemia.    Repeat EKG with similar sinus rhythm and conduction delay, but with inferior J-point elevations.  Minimal reciprocal depressions to lateral leads.  Not quite clear STEMI criteria but certainly concerning for acute ischemia. ____________________________________________  RADIOLOGY  ED MD interpretation: CT head without evidence of acute intracranial pathology. CXR with interstitial edema without pleural effusion  Official radiology report(s): CT HEAD WO CONTRAST (5MM)  Result Date: 02/09/2021 CLINICAL DATA:  Found unresponsive. EXAM: CT HEAD WITHOUT CONTRAST TECHNIQUE: Contiguous axial images were obtained from the base of the skull through  the vertex without intravenous contrast. COMPARISON:  None. FINDINGS: Brain: No evidence of acute infarction, hemorrhage, hydrocephalus, extra-axial collection or mass lesion/mass effect. Scattered areas of low-density in the white matter is suggestive for chronic small vessel ischemic changes. Vascular: No hyperdense vessel or unexpected calcification. Skull: Normal. Negative for fracture or focal lesion. Sinuses/Orbits: Mucosal thickening in the ethmoid air cells. Mastoid air cells are aerated. There is probably fluid and soft tissue fullness in the posterior nasal cavity. Other: None. IMPRESSION: 1. No acute intracranial abnormality. 2. Evidence for chronic small vessel ischemic changes. Electronically Signed   By: Markus Daft M.D.   On: 01/26/2021 17:12   DG Chest Portable 1 View  Result Date: 02/02/2021 CLINICAL DATA:  Intubation, chest pain EXAM: PORTABLE CHEST 1 VIEW COMPARISON:  Chest radiograph 01/05/2020 FINDINGS: The endotracheal tube tip is approximately 3.5 cm from the carina. Median sternotomy wires and mediastinal surgical clips are again seen. The heart is enlarged, unchanged. The upper mediastinal contours are within normal limits. There are increased interstitial opacities throughout both lungs. There is no focal consolidation. There is no pleural effusion or pneumothorax. There is no acute osseous abnormality. IMPRESSION: 1. Increased interstitial opacities throughout both lungs favored to reflect pulmonary interstitial edema with differential including viral/atypical infection. 2. Unchanged cardiomegaly. Electronically Signed   By: Valetta Mole M.D.   On: 01/30/2021 17:04    ____________________________________________   PROCEDURES and INTERVENTIONS  Procedure(s) performed (including Critical Care):  Procedure Name: Intubation Date/Time: 02/02/2021 6:54 PM Performed by: Vladimir Crofts, MD Pre-anesthesia Checklist: Patient identified, Patient being monitored, Emergency Drugs  available, Timeout performed and Suction available Oxygen Delivery Method: Non-rebreather mask Preoxygenation: Pre-oxygenation with 100% oxygen Induction Type: Rapid sequence Ventilation: Mask ventilation without difficulty Laryngoscope Size: Glidescope and 4 Tube size: 7.5 mm Number of attempts: 1 Airway Equipment and Method: Rigid stylet Placement Confirmation: ETT inserted through vocal cords under direct vision, CO2 detector and Breath sounds checked- equal and bilateral Secured at: 25 cm Tube secured with: ETT holder    .1-3 Lead EKG Interpretation Performed by: Vladimir Crofts, MD Authorized by: Vladimir Crofts, MD     Interpretation: abnormal  ECG rate:  104   ECG rate assessment: tachycardic     Rhythm: sinus tachycardia     Ectopy: none     Conduction: normal   .Critical Care Performed by: Vladimir Crofts, MD Authorized by: Vladimir Crofts, MD   Critical care provider statement:    Critical care time (minutes):  75   Critical care time was exclusive of:  Separately billable procedures and treating other patients   Critical care was necessary to treat or prevent imminent or life-threatening deterioration of the following conditions:  Cardiac failure, respiratory failure and shock   Critical care was time spent personally by me on the following activities:  Development of treatment plan with patient or surrogate, discussions with consultants, evaluation of patient's response to treatment, examination of patient, ordering and review of laboratory studies, ordering and review of radiographic studies, ordering and performing treatments and interventions, pulse oximetry, re-evaluation of patient's condition and review of old charts  Medications  etomidate (AMIDATE) injection ( Intravenous Canceled Entry 01/17/2021 1629)  rocuronium (ZEMURON) injection (100 mg Intravenous Given 02/04/2021 1631)  0.9 %  sodium chloride infusion (0 mLs Intravenous Stopped 01/16/2021 1810)  fentaNYL  (SUBLIMAZE) injection ( Intravenous Canceled Entry 01/26/2021 1638)  norepinephrine (LEVOPHED) 4mg  in 213mL premix infusion ( Intravenous Not Given 02/12/2021 1722)  norepinephrine (LEVOPHED) 4mg  in 269mL premix infusion (10 mcg/min Intravenous New Bag/Given 01/16/2021 1644)  fentaNYL 2575mcg in NS 263mL (59mcg/ml) infusion-PREMIX (100 mcg/hr Intravenous New Bag/Given 01/19/2021 1719)  fentaNYL (SUBLIMAZE) injection 100 mcg (100 mcg Intravenous Not Given 01/16/2021 1721)  propofol (DIPRIVAN) 1000 MG/100ML infusion (has no administration in time range)  rocuronium bromide 10 mg/mL (PF) syringe ( Intravenous Not Given 02/05/2021 1723)  heparin ADULT infusion 100 units/mL (25000 units/213mL) (0 Units/hr Intravenous Paused 01/16/2021 1903)  etomidate (AMIDATE) injection 20 mg (20 mg Intravenous Given 01/24/2021 1717)  aspirin suppository 300 mg (300 mg Rectal Given 01/23/2021 1801)  heparin injection 4,000 Units (4,000 Units Intravenous Given 02/06/2021 1757)    ____________________________________________   MDM / ED COURSE   75 year old male presents to the ED after episode of chest pain and diaphoresis, ultimately concerning for inferior STEMI and requiring emergent left heart cath.  He presents poorly responsive with a GCS around 8, tachypneic with clear lungs and diaphoretic looking quite unwell.  He is intubated due to respiratory failure and mental status.  First EKG is fairly unremarkable, sinus tach without ischemic features.  Metabolic work-up is quite reassuring with CKD around baseline, mild and nonspecific leukocytosis.  Lactic acidosis is noted the patient has no stigmata of sepsis.  I see no etiology of infection and he has no fever.  Furthermore, story obtained from the wife does not indicate infectious pathology and is quite concerning for ACS versus PE.  Troponin is elevated and repeat EKG shows inferior ischemic changes.  After intubation he is requiring Levophed for shock.  I was planning to perform CTA  chest to assess for PE versus dissection, but due to his poor renal function and these EKG changes, I reach out to cardiology who will evaluate the patient at the bedside.  Ultimately, decision is made to take him to Cath Lab for emergent left heart cath to assess for STEMI.  ICU consulted for admission and sees patient in the ED prior to going to the Cath Lab.  Patient received rectal aspirin and 4000 units of heparin in the ER.  Clinical Course as of 01/16/2021 1917  Tue Jan 29, 2021  1647 Soft pressures  requiring levophed, getting some imaging [DS]  1745 Discussed with wife, daughter and granddaughter in the family waiting room. Brought family back to the bedside. [DS]  1757 I discuss w Dr End [DS]  4967 3rd EKG similar  [DS]  1823 Reassessed.  Dr. Okey Dupre performing bedside echo [DS]  1840 I discuss with Dr. Kendrick Fries, ICU, he will see for admit [DS]  1843 After Dr. Okey Dupre has reviewed old cath and echo, agrees to take for [facility] now [DS]    Clinical Course User Index [DS] Delton Prairie, MD    ____________________________________________   FINAL CLINICAL IMPRESSION(S) / ED DIAGNOSES  Final diagnoses:  ST elevation myocardial infarction (STEMI) involving other coronary artery of inferior wall (HCC)  Unresponsive state  Acute respiratory failure with hypoxia (HCC)  Cardiogenic shock [facility])     ED Discharge Orders     None        Nico Rogness   Note:  This document was prepared using Dragon voice recognition software and may include unintentional dictation errors.    Delton Prairie, MD 02-23-2021 [phone]

## 2021-01-29 NOTE — Progress Notes (Signed)
Pt transported to ICU on the vent without incident. Report given to ICU RT and remains on the vent and is tol well at this time.

## 2021-01-29 NOTE — ED Triage Notes (Signed)
Pt arrives [facility]. Found in bathroom unresponsive, actively wretching, no emesis visualized.  Reported to C/O CP prior to entering bathroom. O2 sats in 80s RA per EMS, up to 97% on 5L [place].

## 2021-01-29 NOTE — Progress Notes (Signed)
eLink Physician-Brief Progress Note Patient Name: Benjamin Chan DOB: [birth year] MRN: [id number]   Date of Service  2021/02/09  HPI/Events of Note  Patient admitted with acute respiratory failure, STEMI s/p cardiac catheterization,  eICU Interventions  New Patient Evaluation completed.        Thomasene Lot Virgina Deakins 2021/02/09, 9:40 PM

## 2021-01-30 ENCOUNTER — Inpatient Hospital Stay (HOSPITAL_COMMUNITY)
Admit: 2021-01-30 | Discharge: 2021-01-30 | Disposition: A | Discharge status: Home / Self Care | Payer: Medicare HMO | Attending: Internal Medicine | Admitting: Internal Medicine

## 2021-01-30 ENCOUNTER — Inpatient Hospital Stay: Payer: Medicare HMO

## 2021-01-30 ENCOUNTER — Encounter: Payer: Self-pay | Admitting: Internal Medicine

## 2021-01-30 DIAGNOSIS — J9601 Acute respiratory failure with hypoxia: Secondary | ICD-10-CM | POA: Diagnosis not present

## 2021-01-30 DIAGNOSIS — I2119 ST elevation (STEMI) myocardial infarction involving other coronary artery of inferior wall: Secondary | ICD-10-CM | POA: Diagnosis not present

## 2021-01-30 DIAGNOSIS — R570 Cardiogenic shock: Secondary | ICD-10-CM

## 2021-01-30 DIAGNOSIS — I248 Other forms of acute ischemic heart disease: Secondary | ICD-10-CM

## 2021-01-30 DIAGNOSIS — I213 ST elevation (STEMI) myocardial infarction of unspecified site: Secondary | ICD-10-CM

## 2021-01-30 LAB — ECHOCARDIOGRAM COMPLETE
AR max vel: 1.92 cm2
AV Area VTI: 1.9 cm2
AV Area mean vel: 1.75 cm2
AV Mean grad: 9 mmHg
AV Peak grad: 15.7 mmHg
Ao pk vel: 1.98 m/s
Area-P 1/2: 4.26 cm2
Calc EF: 26 %
Height: 66 in
MV VTI: 1.79 cm2
Single Plane A2C EF: 24.2 %
Single Plane A4C EF: 24 %
Weight: 3114.66 oz

## 2021-01-30 LAB — TROPONIN I (HIGH SENSITIVITY)
Troponin I (High Sensitivity): 14142 ng/L (ref <18)
Troponin I (High Sensitivity): 21778 ng/L (ref <18)
Troponin I (High Sensitivity): >24000 ng/L (ref <18)
Troponin I (High Sensitivity): >24000 ng/L (ref <18)
Troponin I (High Sensitivity): >24000 ng/L (ref <18)

## 2021-01-30 LAB — CBC
HCT: 41.6 % (ref 39.0–52.0)
Hemoglobin: 13.7 g/dL (ref 13.0–17.0)
MCH: 30.4 pg (ref 26.0–34.0)
MCHC: 32.9 g/dL (ref 30.0–36.0)
MCV: 92.4 fL (ref 80.0–100.0)
Platelets: 270 10*3/uL (ref 150–400)
RBC: 4.5 MIL/uL (ref 4.22–5.81)
RDW: 13 % (ref 11.5–15.5)
WBC: 18.4 10*3/uL — ABNORMAL HIGH (ref 4.0–10.5)
nRBC: 0 % (ref 0.0–0.2)

## 2021-01-30 LAB — D-DIMER, QUANTITATIVE: D-Dimer, Quant: 6.79 ug/mL-FEU — ABNORMAL HIGH (ref 0.00–0.50)

## 2021-01-30 LAB — BASIC METABOLIC PANEL
Anion gap: 8 (ref 5–15)
BUN: 38 mg/dL — ABNORMAL HIGH (ref 8–23)
CO2: 23 mmol/L (ref 22–32)
Calcium: 8 mg/dL — ABNORMAL LOW (ref 8.9–10.3)
Chloride: 103 mmol/L (ref 98–111)
Creatinine, Ser: 2.64 mg/dL — ABNORMAL HIGH (ref 0.61–1.24)
GFR, Estimated: 24 mL/min — ABNORMAL LOW (ref >60)
Glucose, Bld: 302 mg/dL — ABNORMAL HIGH (ref 70–99)
Potassium: 5.2 mmol/L — ABNORMAL HIGH (ref 3.5–5.1)
Sodium: 134 mmol/L — ABNORMAL LOW (ref 135–145)

## 2021-01-30 LAB — GLUCOSE, CAPILLARY
Glucose-Capillary: 136 mg/dL — ABNORMAL HIGH (ref 70–99)
Glucose-Capillary: 138 mg/dL — ABNORMAL HIGH (ref 70–99)
Glucose-Capillary: 202 mg/dL — ABNORMAL HIGH (ref 70–99)
Glucose-Capillary: 217 mg/dL — ABNORMAL HIGH (ref 70–99)
Glucose-Capillary: 237 mg/dL — ABNORMAL HIGH (ref 70–99)
Glucose-Capillary: 260 mg/dL — ABNORMAL HIGH (ref 70–99)
Glucose-Capillary: 274 mg/dL — ABNORMAL HIGH (ref 70–99)
Glucose-Capillary: 285 mg/dL — ABNORMAL HIGH (ref 70–99)
Glucose-Capillary: 289 mg/dL — ABNORMAL HIGH (ref 70–99)

## 2021-01-30 LAB — BLOOD GAS, ARTERIAL
Acid-base deficit: 3.3 mmol/L — ABNORMAL HIGH (ref 0.0–2.0)
Bicarbonate: 23.2 mmol/L (ref 20.0–28.0)
FIO2: 70
MECHVT: 450 mL
O2 Saturation: 98.8 %
PEEP: 5 cmH2O
Patient temperature: 37
RATE: 30 resp/min
pCO2 arterial: 46 mmHg (ref 32.0–48.0)
pH, Arterial: 7.31 — ABNORMAL LOW (ref 7.350–7.450)
pO2, Arterial: 133 mmHg — ABNORMAL HIGH (ref 83.0–108.0)

## 2021-01-30 LAB — MRSA NEXT GEN BY PCR, NASAL: MRSA by PCR Next Gen: NOT DETECTED

## 2021-01-30 LAB — HEPARIN LEVEL (UNFRACTIONATED)
Heparin Unfractionated: <0.1 IU/mL — ABNORMAL LOW (ref 0.30–0.70)
Heparin Unfractionated: 0.19 IU/mL — ABNORMAL LOW (ref 0.30–0.70)

## 2021-01-30 LAB — HEMOGLOBIN A1C
Hgb A1c MFr Bld: 6.5 % — ABNORMAL HIGH (ref 4.8–5.6)
Mean Plasma Glucose: 139.85 mg/dL

## 2021-01-30 LAB — LACTIC ACID, PLASMA: Lactic Acid, Venous: 1.8 mmol/L (ref 0.5–1.9)

## 2021-01-30 LAB — PHOSPHORUS: Phosphorus: 7.2 mg/dL — ABNORMAL HIGH (ref 2.5–4.6)

## 2021-01-30 LAB — PROCALCITONIN: Procalcitonin: 1.44 ng/mL

## 2021-01-30 LAB — MAGNESIUM: Magnesium: 2.4 mg/dL (ref 1.7–2.4)

## 2021-01-30 MED ORDER — ASPIRIN 81 MG PO CHEW
81.0000 mg | CHEWABLE_TABLET | Freq: Every day | ORAL | Status: DC
  Administered 2021-01-30 – 2021-01-31 (×2): 81 mg
  Filled 2021-01-30 (×2): qty 1

## 2021-01-30 MED ORDER — PERFLUTREN LIPID MICROSPHERE
1.0000 mL | INTRAVENOUS | Status: AC | PRN
  Administered 2021-01-30: 3 mL via INTRAVENOUS
  Filled 2021-01-30: qty 10

## 2021-01-30 MED ORDER — PROSOURCE TF PO LIQD
90.0000 mL | Freq: Two times a day (BID) | ORAL | Status: DC
  Administered 2021-01-30 – 2021-01-31 (×3): 90 mL
  Filled 2021-01-30 (×3): qty 90

## 2021-01-30 MED ORDER — HEPARIN BOLUS VIA INFUSION
2400.0000 [IU] | Freq: Once | INTRAVENOUS | Status: AC
  Administered 2021-01-30: 2400 [IU] via INTRAVENOUS
  Filled 2021-01-30: qty 2400

## 2021-01-30 MED ORDER — ORAL CARE MOUTH RINSE
15.0000 mL | OROMUCOSAL | Status: DC
  Administered 2021-01-30 – 2021-01-31 (×11): 15 mL via OROMUCOSAL

## 2021-01-30 MED ORDER — NOREPINEPHRINE 16 MG/250ML-% IV SOLN
0.0000 ug/min | INTRAVENOUS | Status: DC
  Administered 2021-01-30: 14 ug/min via INTRAVENOUS
  Administered 2021-02-01: 12 ug/min via INTRAVENOUS
  Filled 2021-01-30 (×2): qty 250

## 2021-01-30 MED ORDER — FREE WATER
30.0000 mL | Status: DC
Start: 2021-01-30 — End: 2021-02-01
  Administered 2021-01-30 – 2021-02-01 (×12): 30 mL

## 2021-01-30 MED ORDER — CHLORHEXIDINE GLUCONATE 0.12% ORAL RINSE (MEDLINE KIT)
15.0000 mL | Freq: Two times a day (BID) | OROMUCOSAL | Status: DC
  Administered 2021-01-30 – 2021-01-31 (×3): 15 mL via OROMUCOSAL

## 2021-01-30 MED ORDER — VITAL HIGH PROTEIN PO LIQD
1000.0000 mL | ORAL | Status: DC
  Administered 2021-01-30: 1000 mL

## 2021-01-30 NOTE — Progress Notes (Signed)
ANTICOAGULATION CONSULT NOTE  Pharmacy Consult for heparin Indication: chest pain/ACS  Patient Measurements: Height: 5\' 6"  (167.6 cm) Weight: 88.3 kg (194 lb 10.7 oz) IBW/kg (Calculated) : 63.8 Heparin Dosing Weight: 82 kg  Labs: Recent Labs    02/12/2021 1640 01/30/21 0244 01/30/21 0412 01/30/21 0520 01/30/21 1040 01/30/21 1143 01/30/21 1338 01/30/21 2152  HGB 14.7  --  13.7  --   --   --   --   --   HCT 44.2  --  41.6  --   --   --   --   --   PLT 284  --  270  --   --   --   --   --   APTT 27  --   --   --   --   --   --   --   LABPROT 13.7  --   --   --   --   --   --   --   INR 1.1  --   --   --   --   --   --   --   HEPARINUNFRC  --   --   --   --   --   --  <0.10* 0.19*  CREATININE 1.91*  --  2.64*  --   --   --   --   --   TROPONINIHS 103*   < >  --    < > >24,000* >24,000* >24,000*  --    < > = values in this interval not displayed.     Estimated Creatinine Clearance: 24.4 mL/min (A) (by C-G formula based on SCr of 2.64 mg/dL (H)).   Medical History: Past Medical History:  Diagnosis Date   Arthritis    shoulder   BPH (benign prostatic hyperplasia)    CAD (coronary artery disease)    Chronic kidney disease    COPD (chronic obstructive pulmonary disease) (HCC)    Diabetes mellitus without complication (HCC)    Diabetic ketoacidosis without coma associated with type 2 diabetes mellitus (HCC)    Heart attack (HCC) 06/2019   Hypertension    MI (myocardial infarction) (HCC) 03/2017   Non-ST elevation myocardial infarction (NSTEMI), subendocardial infarction, subsequent episode of care [facility]) 04/14/2017   NSTEMI (non-ST elevated myocardial infarction) (HCC) 07/14/2019     Assessment: 75 year old male brought to the ED after being found unresponsive. Pharmacy consulted for heparin management for ACS. No anticoagulation noted PTA.  Goal of Therapy:  Heparin level 0.3-0.7 units/ml Monitor platelets by anticoagulation protocol: Yes  11/16 2152 HL 0.19    Plan: --Heparin bolus 2400 units x 1  --Increase heparin infusion to 1150 units/hr --Will re-check HL with AM labs --Daily CBC per protocol  2153, PharmD, [facility] 01/30/2021 11:06 PM

## 2021-01-30 NOTE — Progress Notes (Signed)
  Chaplain On-Call received referral from Overnight Chaplain Dorothy Puffer to continue to provide spiritual care for patient and family.  Chaplain arrived on the Unit and learned from Staff that the patient is sedated, and no family is present.  Chaplain spoke to the patient, who opened his eyes briefly. Chaplain provided prayer for the patient .  Chaplain assured Staff of the availability of Chaplains for continuing spiritual and emotional support as needed.  Chaplain Evelena Peat M.Div., [facility]

## 2021-01-30 NOTE — Progress Notes (Signed)
ANTICOAGULATION CONSULT NOTE  Pharmacy Consult for heparin Indication: chest pain/ACS  Patient Measurements: Height: 5\' 6"  (167.6 cm) Weight: 88.3 kg (194 lb 10.7 oz) IBW/kg (Calculated) : 63.8 Heparin Dosing Weight: 82 kg  Labs: Recent Labs    02/26/2021 1640 01/30/21 0244 01/30/21 0412 01/30/21 0520 01/30/21 1040 01/30/21 1143 01/30/21 1338  HGB 14.7  --  13.7  --   --   --   --   HCT 44.2  --  41.6  --   --   --   --   PLT 284  --  270  --   --   --   --   APTT 27  --   --   --   --   --   --   LABPROT 13.7  --   --   --   --   --   --   INR 1.1  --   --   --   --   --   --   HEPARINUNFRC  --   --   --   --   --   --  <0.10*  CREATININE 1.91*  --  2.64*  --   --   --   --   TROPONINIHS 103*   < >  --  21,778* >24,000* >24,000*  --    < > = values in this interval not displayed.     Estimated Creatinine Clearance: 24.4 mL/min (A) (by C-G formula based on SCr of 2.64 mg/dL (H)).   Medical History: Past Medical History:  Diagnosis Date   Arthritis    shoulder   BPH (benign prostatic hyperplasia)    CAD (coronary artery disease)    Chronic kidney disease    COPD (chronic obstructive pulmonary disease) (HCC)    Diabetes mellitus without complication (HCC)    Diabetic ketoacidosis without coma associated with type 2 diabetes mellitus (HCC)    Heart attack (HCC) 06/2019   Hypertension    MI (myocardial infarction) (HCC) 03/2017   Non-ST elevation myocardial infarction (NSTEMI), subendocardial infarction, subsequent episode of care [facility]) 04/14/2017   NSTEMI (non-ST elevated myocardial infarction) (HCC) 07/14/2019     Assessment: 75 year old male brought to the ED after being found unresponsive. Pharmacy consulted for heparin management for ACS. No anticoagulation noted PTA.  Goal of Therapy:  Heparin level 0.3-0.7 units/ml Monitor platelets by anticoagulation protocol: Yes   Plan:  --Heparin level below detectable limit. Per chart review, heparin paused from  1130 to 1330. Discussed with RN, heparin drip was off for A-line placement. Level was drawn at 1338 --Continue heparin infusion at current rate of 950 units/hr --Will re-check HL at 2200 --Daily CBC per protocol  62 01/30/2021 3:29 PM

## 2021-01-30 NOTE — Consult Note (Signed)
CARDIOLOGY CONSULT NOTE               Patient ID: Benjamin Chan MRN: [id number] DOB/AGE: September 03, 1945 75 y.o.  Admit date: 01/21/2021 Referring Physician Dr Kendrick Fries hospitalist Primary Physician Dr. Caren Griffins primary Primary Cardiologist Dr. Gwen Pounds Reason for Consultation cardiogenic shock respiratory failure acute MI  HPI: Patient is a 75 year old male multivessel coronary status post chordee bypass surgery chronic systolic congestive heart failure hypertension hyperlipidemia diabetes chronic insufficiency admitted to the emergency room 1115 with what was thought to be potential STEMI patient was evaluated by STEMI team and STEMI was deferred patient appeared to be in cardiogenic shock respiratory failure and patient was treated in the emergency room and transferred to ICU and has been on respiratory support pressor support and case is transferred to [facility] cardiology since his colonoscopy is patient by Dr. Okey Dupre  Review of systems complete and found to be negative unless listed above     Past Medical History:  Diagnosis Date   Arthritis    shoulder   BPH (benign prostatic hyperplasia)    CAD (coronary artery disease)    Chronic kidney disease    COPD (chronic obstructive pulmonary disease) (HCC)    Diabetes mellitus without complication (HCC)    Diabetic ketoacidosis without coma associated with type 2 diabetes mellitus (HCC)    Heart attack (HCC) 06/2019   Hypertension    MI (myocardial infarction) (HCC) 03/2017   Non-ST elevation myocardial infarction (NSTEMI), subendocardial infarction, subsequent episode of care [name]) 04/14/2017   NSTEMI (non-ST elevated myocardial infarction) (HCC) 07/14/2019    Past Surgical History:  Procedure Laterality Date   COLONOSCOPY WITH PROPOFOL N/A 05/02/2019   Procedure: COLONOSCOPY WITH PROPOFOL;  Surgeon: Midge Minium, MD;  Location: [facility];  Service: Endoscopy;  Laterality: N/A;  Diabetic - insulin and oral meds   CORONARY  ARTERY BYPASS GRAFT  2006   2 vessel   CORONARY STENT INTERVENTION N/A 03/30/2017   Procedure: CORONARY STENT INTERVENTION;  Surgeon: Alwyn Pea, MD;  Location: [facility];  Service: Cardiovascular;  Laterality: N/A;   INGUINAL HERNIA REPAIR     right   LEFT HEART CATH AND CORONARY ANGIOGRAPHY N/A 07/14/2019   Procedure: LEFT HEART CATH AND CORONARY ANGIOGRAPHY;  Surgeon: Dalia Heading, MD;  Location: [facility];  Service: Cardiovascular;  Laterality: N/A;   LEFT HEART CATH AND CORS/GRAFTS ANGIOGRAPHY N/A 03/30/2017   Procedure: LEFT HEART CATH AND CORS/GRAFTS ANGIOGRAPHY;  Surgeon: Lamar Blinks, MD;  Location: [facility];  Service: Cardiovascular;  Laterality: N/A;   LEFT HEART CATH AND CORS/GRAFTS ANGIOGRAPHY N/A 01/24/2021   Procedure: LEFT HEART CATH AND CORS/GRAFTS ANGIOGRAPHY;  Surgeon: Yvonne Kendall, MD;  Location: [facility];  Service: Cardiovascular;  Laterality: N/A;    Medications Prior to Admission  Medication Sig Dispense Refill Last Dose   acetaminophen (TYLENOL) 325 MG tablet Take 650 mg by mouth every 6 (six) hours as needed.      albuterol (VENTOLIN HFA) 108 (90 Base) MCG/ACT inhaler Inhale 2 puffs into the lungs every 6 (six) hours as needed for wheezing or shortness of breath. 18 g 2    ASPIRIN LOW DOSE 81 MG EC tablet TAKE ONE TABLET BY MOUTH ONCE DAILY 90 tablet 0    atorvastatin (LIPITOR) 80 MG tablet TAKE ONE TABLET BY MOUTH AT BEDTIME 90 tablet 3    clopidogrel (PLAVIX) 75 MG tablet TAKE ONE TABLET BY MOUTH EVERY MORNING 90 tablet  1    Dulaglutide (TRULICITY) A999333 0000000 SOPN Inject 0.75 mg into the skin once a week. Taking on Wednesdays      ezetimibe (ZETIA) 10 MG tablet TAKE ONE TABLET BY MOUTH ONCE DAILY 90 tablet 0    finasteride (PROSCAR) 5 MG tablet Take 1 tablet (5 mg total) by mouth daily. 90 tablet 1    fluticasone (FLONASE) 50 MCG/ACT nasal spray Place 1 spray into both nostrils 2 (two) times daily. Pt  takes twice a day 16 g 2    Fluticasone-Umeclidin-Vilant (TRELEGY ELLIPTA) 100-62.5-25 MCG/INH AEPB Inhale 1 puff into the lungs daily. 60 each 11    furosemide (LASIX) 40 MG tablet Take 60 mg by mouth daily.      glipiZIDE (GLUCOTROL) 5 MG tablet TAKE ONE TABLET BY MOUTH EVERY MORNING 90 tablet 1    Insulin Glargine (BASAGLAR KWIKPEN) 100 UNIT/ML Inject 40 Units into the skin in the morning.      Insulin Pen Needle (PEN NEEDLES 5/16") 30G X 8 MM MISC 1 each by Does not apply route daily. 100 each 12    JARDIANCE 25 MG TABS tablet TAKE ONE TABLET BY MOUTH ONCE DAILY 90 tablet 1    losartan (COZAAR) 100 MG tablet Take 1 tablet (100 mg total) by mouth daily. 90 tablet 1    metFORMIN (GLUCOPHAGE-XR) 500 MG 24 hr tablet Take 2 tablets (1,000 mg total) by mouth 2 (two) times daily. 360 tablet 1    metoprolol succinate (TOPROL-XL) 25 MG 24 hr tablet       metoprolol succinate (TOPROL-XL) 50 MG 24 hr tablet Take by mouth.      Multiple Vitamin (MULTIVITAMIN) TABS TAKE ONE TABLET BY MOUTH ONCE DAILY 30 tablet 6    nitroGLYCERIN (NITROSTAT) 0.4 MG SL tablet Place 1 tablet (0.4 mg total) under the tongue every 5 (five) minutes as needed for chest pain. 30 tablet 12    OneTouch Delica Lancets 99991111 MISC 1 each by Does not apply route daily. 100 each 12    spironolactone (ALDACTONE) 25 MG tablet Take 1 tablet (25 mg total) by mouth daily. 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   Social Determinants of Radio broadcast assistant Strain: Low Risk    Difficulty of Paying Living Expenses: Not hard at all  Food Insecurity: No Food Insecurity   Worried About Charity fundraiser in the Last Year: Never true   Arboriculturist in the Last Year: Never true  Transportation Needs: No Transportation Needs   Lack of Transportation (Medical): No   Lack of Transportation (Non-Medical): No  Physical Activity: Inactive   Days of Exercise per Week: 0 days  Minutes of Exercise per Session: 0 min  Stress: No Stress Concern Present   Feeling of Stress : Not at all  Social Connections: Not on file  Intimate Partner Violence: Not on file    Family History  Problem Relation Age of Onset   Hypertension Father    Hypertension Mother    Hypertension Sister    Hypertension Brother    Hypertension Brother    Hypertension Brother    Hypertension Brother    Hypertension Brother    Hypertension Sister    Hypertension Sister    Hypertension Sister    Hypertension Sister    Hypertension Sister       Review of systems complete and found to be negative unless listed above      PHYSICAL EXAM  General: Well developed, well nourished, in no acute distress HEENT:  Normocephalic and atramatic Neck:  No JVD.  Lungs: Clear bilaterally to auscultation and percussion. Heart: HRRR . Normal S1 and S2 without gallops or murmurs.  Abdomen: Bowel sounds are positive, abdomen soft and non-tender  Msk:  Back normal, normal gait. Normal strength and tone for age. Extremities: No clubbing, cyanosis or edema.   Neuro: Alert and oriented X 3. Psych:  Good affect, responds appropriately  Labs:   Lab Results  Component Value Date   WBC 18.4 (H) 01/30/2021   HGB 13.7 01/30/2021   HCT 41.6 01/30/2021   MCV 92.4 01/30/2021   PLT 270 01/30/2021    Recent Labs  Lab 02/09/2021 1640 01/30/21 0412   NA 136 134*  K 3.3* 5.2*  CL 104 103  CO2 22 23  BUN 32* 38*  CREATININE 1.91* 2.64*  CALCIUM 8.6* 8.0*  PROT 7.8  --   BILITOT 0.9  --   ALKPHOS 82  --   ALT 24  --   AST 31  --   GLUCOSE 192* 302*   Lab Results  Component Value Date   CKTOTAL 605 (H) 06/29/2015   TROPONINI 1.09 (HH) 03/28/2017    Lab Results  Component Value Date   CHOL 153 10/03/2020   CHOL 116 07/04/2020   CHOL 115 01/05/2020   Lab Results  Component Value Date   HDL 36 (L) 10/03/2020   HDL 33 (L) 07/04/2020   HDL 34 (L) 01/05/2020   Lab Results  Component Value Date   LDLCALC 91 10/03/2020   LDLCALC 69 07/04/2020   LDLCALC 63 01/05/2020   Lab Results  Component Value Date   TRIG 144 10/03/2020   TRIG 64 07/04/2020   TRIG 92 01/05/2020   Lab Results  Component Value Date   CHOLHDL 4.3 10/03/2020   CHOLHDL 3.5 07/04/2020   CHOLHDL 2.6 07/15/2019   No results found for: LDLDIRECT    Radiology: CT HEAD WO CONTRAST (5MM)  Result Date: 01/31/2021 CLINICAL DATA:  Found unresponsive. EXAM: CT HEAD WITHOUT CONTRAST TECHNIQUE: Contiguous axial images were obtained from the base of the skull through the vertex without intravenous contrast. COMPARISON:  None. FINDINGS: Brain: No evidence of acute infarction, hemorrhage, hydrocephalus, extra-axial collection or mass lesion/mass effect. Scattered areas of low-density in the white matter is suggestive for chronic small vessel ischemic changes. Vascular: No hyperdense vessel or unexpected calcification. Skull: Normal. Negative for fracture or focal lesion. Sinuses/Orbits: Mucosal thickening in the ethmoid air cells. Mastoid air cells are aerated. There is probably fluid and soft tissue fullness in the posterior nasal cavity. Other: None. IMPRESSION: 1. No acute intracranial abnormality. 2. Evidence for chronic  small vessel ischemic changes. Electronically Signed   By: Markus Daft M.D.   On: 01/18/2021 17:12   CARDIAC CATHETERIZATION  Result Date:  01/17/2021 Conclusions: Severe native coronary artery disease that is very similar to prior diagnostic catheterization from 06/2019.  This includes i95% proximal LAD stenosis, chronic total occlusion of the proximal LCx, and sequential 70% and 30% mid RCA stenoses. Patent LIMA-LAD with 20% mid graft stenosis. Patent SVG-D2 with 30% proximal graft disease. Chronically occluded SVG-OM2. Severely elevated left ventricular filling pressure (LVEDP ~35 mmHg). Recommendations: No obvious culprit for patient acute angina and subtle inferior ST elevation.  I suspect his symptoms and EKG findings may be due to acute on chronic HFrEF complicated by worsening mitral regurgitation noted on bedside echocardiogram this evening. Wean norepinephrine as tolerated. Establish central venous access in ICU and obtain central venous O2 saturation.  Consider adding milrinone if SvO2 < 70%. Aggressive diuresis. Nelva Bush, MD [facility]  DG Chest Port 1 View  Result Date: 01/30/2021 CLINICAL DATA:  Acute respiratory failure EXAM: PORTABLE CHEST 1 VIEW COMPARISON:  Chest radiograph 02/09/2021 FINDINGS: The endotracheal tube is in stable position. A right IJ central venous catheter is in stable position. Median sternotomy wires are again noted. The heart is enlarged, unchanged. Prominent interstitial markings are again seen which suggest mild pulmonary interstitial edema. There is no new focal airspace disease. The right costophrenic angle is partially excluded, there is no large right effusion. There is no significant left effusion. There is no pneumothorax. IMPRESSION: Overall no significant interval change in lung aeration with stable mild pulmonary interstitial edema. Electronically Signed   By: Valetta Mole M.D.   On: 01/30/2021 08:00   DG Chest Port 1 View  Result Date: 02/06/2021 CLINICAL DATA:  Central line placement EXAM: PORTABLE CHEST 1 VIEW COMPARISON:  01/21/2021 FINDINGS: Endotracheal tube is 3 cm above the  carina. Right central line tip is at the cavoatrial junction. No pneumothorax. NG tube is in the stomach. Cardiomegaly. Bibasilar atelectasis. Interstitial prominence again noted may reflect interstitial edema. IMPRESSION: Right central line placement with the tip at the cavoatrial junction. No pneumothorax. Otherwise no change. Electronically Signed   By: Rolm Baptise M.D.   On: 01/28/2021 22:45   DG Chest Port 1 View  Result Date: 01/28/2021 CLINICAL DATA:  OG tube placed none. EXAM: PORTABLE CHEST 1 VIEW COMPARISON:  Radiograph earlier today FINDINGS: Tip and side port of the enteric tube are below the diaphragm in the stomach. Endotracheal tube tip just below the clavicular heads. Post median sternotomy. Stable cardiomegaly. Similar interstitial thickening suspicious for pulmonary edema, equivocal worsening on the right. No convincing pleural effusion. No pneumothorax. IMPRESSION: 1. Tip and side port of the enteric tube below the diaphragm in the stomach. Endotracheal tube tip just below the clavicular heads. 2. Stable cardiomegaly. Similar interstitial thickening suspicious for pulmonary edema, equivocal worsening on the right. Electronically Signed   By: Keith Rake M.D.   On: 01/31/2021 21:31   DG Chest Portable 1 View  Result Date: 01/26/2021 CLINICAL DATA:  Intubation, chest pain EXAM: PORTABLE CHEST 1 VIEW COMPARISON:  Chest radiograph 01/05/2020 FINDINGS: The endotracheal tube tip is approximately 3.5 cm from the carina. Median sternotomy wires and mediastinal surgical clips are again seen. The heart is enlarged, unchanged. The upper mediastinal contours are within normal limits. There are increased interstitial opacities throughout both lungs. There is no focal consolidation. There is no pleural effusion or pneumothorax. There is no acute osseous abnormality. IMPRESSION: 1.  Increased interstitial opacities throughout both lungs favored to reflect pulmonary interstitial edema with  differential including viral/atypical infection. 2. Unchanged cardiomegaly. Electronically Signed   By: Valetta Mole M.D.   On: 02/02/2021 17:04    EKG: Normal sinus rhythm nonspecific ST-T wave changes borderline ST elevation rate of 80  ASSESSMENT AND PLAN:  ACute MI Cardiogenic Shock CABG CHF-S Hyperlipidemia CRI DM Resp Failure Troponins greater than 24,000 . Plan Agree with ICU Vent support Heparin Agree with ECHO Defer cath Continue critical care/resp care Wean pressors when possible Wean vent when possible   Signed: Yolonda Kida MD 01/30/2021, 8:16 AM

## 2021-01-30 NOTE — Progress Notes (Signed)
NAME:  EVA VALLEE, MRN:  [id number], DOB:  [birth year], LOS: 1 ADMISSION DATE:  01/23/2021, CONSULTATION DATE: 02/05/2021 REFERRING MD: Vladimir Crofts, MD, CHIEF COMPLAINT: Chest pain, MI  History of Present Illness:  75 year old with history of hypertension, diabetes, CHF, coronary artery disease status post CABG, hyperlipidemia, BPH, CKD.  Presenting with chest pain, inferior ST elevation MI.  He was intubated in the emergency room.  Evaluated by cardiology and taken to the Cath Lab from the ED  Pertinent  Medical History   Past Medical History:  Diagnosis Date   Arthritis    shoulder   BPH (benign prostatic hyperplasia)    CAD (coronary artery disease)    Chronic kidney disease    COPD (chronic obstructive pulmonary disease) ([place])    Diabetes mellitus without complication ([place])    Diabetic ketoacidosis without coma associated with type 2 diabetes mellitus ([place])    Heart attack ([place]) 06/2019   Hypertension    MI (myocardial infarction) ([place]) 03/2017   Non-ST elevation myocardial infarction (NSTEMI), subendocardial infarction, subsequent episode of care [facility]) 04/14/2017   NSTEMI (non-ST elevated myocardial infarction) ([place]) 07/14/2019     Significant Hospital Events: Including procedures, antibiotic start and stop dates in addition to other pertinent events   11/15 admit, intubation.  Cardiac cath 11/16: Remains in shock, suspect cardiogenic.  Worsening Leukocytosis and Procalcitonin ~ perform infectious workup.  Hold Lasix due to worsening renal function  MICRO Data:  01/28/2021: SARS-CoV-2 & Influenza PCR>>negative 01/21/2021: Blood culture>> 01/28/2021: MRSA PCR>>negative 01/30/2021: Tracheal aspirate>>  Antimicrobials:  N/A  Procedures:  01/24/2021: Cardiac Cath: Conclusions: Severe native coronary artery disease that is very similar to prior diagnostic catheterization from 06/2019.  This includes i95% proximal LAD stenosis, chronic total occlusion of the proximal LCx,  and sequential 70% and 30% mid RCA stenoses.  Patent LIMA-LAD with 20% mid graft stenosis. Patent SVG-D2 with 30% proximal graft disease. Chronically occluded SVG-OM2. Severely elevated left ventricular filling pressure (LVEDP ~35 mmHg). Recommendations: No obvious culprit for patient acute angina and subtle inferior ST elevation.  I suspect his symptoms and EKG findings may be due to acute on chronic HFrEF complicated by worsening mitral regurgitation noted on bedside echocardiogram this evening. Wean norepinephrine as tolerated. Establish central venous access in ICU and obtain central venous O2 saturation.  Consider adding milrinone if SvO2 < 70%. Aggressive diuresis.  Interim History / Subjective:  -Presented yesterday with STEMI -Underwent Cardiac Cath: Severe native CAD similar to prior Cath from 06/2019 (95% stensosis of proximal LAD, total occlusion of proximal Left circumflex -No obvious culprit lesions to explain acute angina and inferior ST elevation ~ Cardiology suspects findings due to Acute on Chronic HFrEF complicated by worsening mitral regurgitation -Continues to require Levophed (currently 15 mcg) -Plan to check Coox panel & D-dimer -Worsening renal function, Cr. 2.64 (1.91) ~ will hold diuresis today -Worsening Leukocytosis and increasing PCT ~ no obvious Pneumonia on CXR and UA negative for UTI ~ will check blood cultures and obtain Tracheal aspirate if able  Objective   Blood pressure (!) 90/59, pulse 81, temperature 99.3 F (37.4 C), temperature source Oral, resp. rate (!) 26, height _0  (1.676 m), weight 88.3 kg, SpO2 96 %. CVP:  [5 mmHg-16 mmHg] 16 mmHg  Vent Mode: PRVC FiO2 (%):  [70 %-100 %] 70 % Set Rate:  [26 bmp-30 bmp] 30 bmp Vt Set:  [450 mL] 450 mL PEEP:  [5 cmH20] 5 cmH20   Intake/Output Summary (Last 24 hours) at 01/30/2021  0948 Last data filed at 01/30/2021 0644 Gross per 24 hour  Intake 764.2 ml  Output 730 ml  Net 34.2 ml   Filed Weights    01/21/2021 1619 01/23/2021 1743  Weight: 88.3 kg 88.3 kg    Examination: Gen:      Critically ill appearing, intubated and lightly sedated, in NAD HEENT:  Atraumatic, normocephalic,EOMI, sclera anicteric Neck:     No masses; no thyromegaly, ETT Lungs:    Clear to auscultation bilaterally; synchronous with the vent, event CV:         Regular rate and rhythm; s1s2, no murmurs Abd:      + bowel sounds; soft, non-tender; no palpable masses, no distension Ext:    No edema; adequate peripheral perfusion Skin:      Warm and dry; no rash Neuro: Lightly sedated, arouses to voice, intermittently follows commands, pupils PERRL   Resolved Hospital Problem list     Assessment & Plan:   Inferior ST elevation MI Acute on Chronic HFrEF Shock: Suspect Cardiogenic Hs: ischemic cardiomyopathy, coronary artery disease Cardiac cath notes reviewed with patent grafts and no obvious culprit lesion He does have elevated LVEDP indicating decompensated heart failure -Continuous cardiac monitoring -Maintain MAP >65 -Vasopressors as needed to maintain MAP goal -Trend lactic acid until normalized (3.7 ~ 2.2 ~ 1.8) -Follow Coox panel ~ If SvO2 <70%, then start Milrinone -Trend HS Troponin until peaked (103 ~ 14,142 ~ 21,778 ~) -Echocardiogram pending -Cardiology following, appreciate input -Continue Heparin gtt -Diuresis as BP and renal function permits ~ will hold furhter diuresis today 11/16 due to worsening renal function  Acute encephalopathy Sedation needs in setting of mechanical ventilation -Maintain a RASS goal of 0 to -1 -Fentanyl gtt as needed to maintain RASS goal -Avoid sedating medications as able -Daily wake up assessment -CT with no acute abnormalities  Acute hypoxic respiratory failure in setting of Acute on Chronic HFrEF with Pulmonary Edema Hx: COPD -Full vent support, implement lung protective strategies -Plateau pressures less than 30 cm H20 -Wean FiO2 & PEEP as tolerated to  maintain O2 sats >92% -Follow intermittent Chest X-ray & ABG as needed -Spontaneous Breathing Trials when respiratory parameters met and mental status permits -Implement VAP Bundle -Bronchodilators -Diuresis as BP and renal function permits  Leukocytosis No obvious source of infection : UA is negative, CXR not concerning for Pneumonia, no signs of skin infection, abdominal exam is benign -Monitor fever curve -Trend WBC's & Procalcitonin -Follow cultures as above: Discussed with Dr. [place] Bells, will obtain blood cultures and obtain tracheal aspirate if able -Will hold off on empiric ABX for now  AKI on Chronic kidney disease Mild Hyperkalemia -Monitor I&O's / urinary output -Follow BMP -Ensure adequate renal perfusion -Avoid nephrotoxic agents as able -Replace electrolytes as indicated -Consider Nephrology consult  Diabetes Mellitus -CBG's q4h; Target range of 140 to 180 -SSI -Follow ICU Hypo/Hyperglycemia protocol     Best Practice (right click and "Reselect all SmartList Selections" daily)   Diet/type: NPO, start tube feeds DVT prophylaxis: systemic heparin GI prophylaxis: PPI Lines: Central line Foley:  Yes, and it is still needed Code Status:  full code Last date of multidisciplinary goals of care discussion [01/30/2021]  Pt's wife updated at bedside 01/30/21 by both Dr. [place] Bells and myself.  Labs   CBC: Recent Labs  Lab 02/07/2021 1640 01/30/21 0412  WBC 13.9* 18.4*  NEUTROABS 8.2*  --   HGB 14.7 13.7  HCT 44.2 41.6  MCV 91.9 92.4  PLT 284 270  Basic Metabolic Panel: Recent Labs  Lab 01/21/2021 1640 01/30/21 0412  NA 136 134*  K 3.3* 5.2*  CL 104 103  CO2 22 23  GLUCOSE 192* 302*  BUN 32* 38*  CREATININE 1.91* 2.64*  CALCIUM 8.6* 8.0*  MG 2.5* 2.4  PHOS  --  7.2*    GFR: Estimated Creatinine Clearance: 24.4 mL/min (A) (by C-G formula based on SCr of 2.64 mg/dL (H)). Recent Labs  Lab 02/04/2021 1640 02/12/2021 2233 01/30/21 0412  PROCALCITON   --  0.65 1.44  WBC 13.9*  --  18.4*  LATICACIDVEN 3.7* 2.2* 1.8     Liver Function Tests: Recent Labs  Lab 02/02/2021 1640  AST 31  ALT 24  ALKPHOS 82  BILITOT 0.9  PROT 7.8  ALBUMIN 4.2    No results for input(s): LIPASE, AMYLASE in the last 168 hours. No results for input(s): AMMONIA in the last 168 hours.  ABG    Component Value Date/Time   PHART 7.31 (L) 01/30/2021 0436   PCO2ART 46 01/30/2021 0436   PO2ART 133 (H) 01/30/2021 0436   HCO3 23.2 01/30/2021 0436   ACIDBASEDEF 3.3 (H) 01/30/2021 0436   O2SAT 98.8 01/30/2021 0436      Coagulation Profile: Recent Labs  Lab 01/17/2021 1640  INR 1.1     Cardiac Enzymes: No results for input(s): CKTOTAL, CKMB, CKMBINDEX, TROPONINI in the last 168 hours.  HbA1C: HB A1C (BAYER DCA - WAIVED)  Date/Time Value Ref Range Status  01/05/2020 03:28 PM 6.6 <7.0 % Final    Comment:                                          Diabetic Adult            <7.0                                       Healthy Adult        4.3 - 5.7                                                           (DCCT/NGSP) American Diabetes Association's Summary of Glycemic Recommendations for Adults with Diabetes: Hemoglobin A1c <7.0%. More stringent glycemic goals (A1c <6.0%) may further reduce complications at the cost of increased risk of hypoglycemia.   04/07/2018 10:24 AM 11.4 (H) <7.0 % Final    Comment:                                          Diabetic Adult            <7.0                                       Healthy Adult        4.3 - 5.7                                                           (  DCCT/NGSP) American Diabetes Association's Summary of Glycemic Recommendations for Adults with Diabetes: Hemoglobin A1c <7.0%. More stringent glycemic goals (A1c <6.0%) may further reduce complications at the cost of increased risk of hypoglycemia.    Hgb A1c MFr Bld  Date/Time Value Ref Range Status  02/11/2021 10:33 PM 6.5 (H) 4.8 - 5.6 % Final     Comment:    (NOTE) Pre diabetes:          5.7%-6.4%  Diabetes:              >6.4%  Glycemic control for   <7.0% adults with diabetes   10/03/2020 11:44 AM 7.8 (H) 4.8 - 5.6 % Final    Comment:             Prediabetes: 5.7 - 6.4          Diabetes: >6.4          Glycemic control for adults with diabetes: <7.0     CBG: Recent Labs  Lab 01/19/2021 2035 01/30/21 0013 01/30/21 0358 01/30/21 0418 01/30/21 0710  GLUCAP 289* 237* 274* 285* 260*    Review of Systems:   Unable to obtain as patient is intubated  Past Medical History:  He,  has a past medical history of Arthritis, BPH (benign prostatic hyperplasia), CAD (coronary artery disease), Chronic kidney disease, COPD (chronic obstructive pulmonary disease) ([place]), Diabetes mellitus without complication ([place]), Diabetic ketoacidosis without coma associated with type 2 diabetes mellitus ([place]), Heart attack ([place]) (06/2019), Hypertension, MI (myocardial infarction) ([place]) (03/2017), Non-ST elevation myocardial infarction (NSTEMI), subendocardial infarction, subsequent episode of care [facility]) (04/14/2017), and NSTEMI (non-ST elevated myocardial infarction) ([place]) (07/14/2019).   Surgical History:   Past Surgical History:  Procedure Laterality Date   COLONOSCOPY WITH PROPOFOL N/A 05/02/2019   Procedure: COLONOSCOPY WITH PROPOFOL;  Surgeon: Lucilla Lame, MD;  Location: [facility];  Service: Endoscopy;  Laterality: N/A;  Diabetic - insulin and oral meds   CORONARY ARTERY BYPASS GRAFT  2006   2 vessel   CORONARY STENT INTERVENTION N/A 03/30/2017   Procedure: CORONARY STENT INTERVENTION;  Surgeon: Yolonda Kida, MD;  Location: [place] [facility];  Service: Cardiovascular;  Laterality: N/A;   INGUINAL HERNIA REPAIR     right   LEFT HEART CATH AND CORONARY ANGIOGRAPHY N/A 07/14/2019   Procedure: LEFT HEART CATH AND CORONARY ANGIOGRAPHY;  Surgeon: Teodoro Spray, MD;  Location: [place] [facility];  Service: Cardiovascular;   Laterality: N/A;   LEFT HEART CATH AND CORS/GRAFTS ANGIOGRAPHY N/A 03/30/2017   Procedure: LEFT HEART CATH AND CORS/GRAFTS ANGIOGRAPHY;  Surgeon: Corey Skains, MD;  Location: [place] [facility];  Service: Cardiovascular;  Laterality: N/A;   LEFT HEART CATH AND CORS/GRAFTS ANGIOGRAPHY N/A 01/15/2021   Procedure: LEFT HEART CATH AND CORS/GRAFTS ANGIOGRAPHY;  Surgeon: Nelva Bush, MD;  Location: [place] [facility];  Service: Cardiovascular;  Laterality: N/A;     Social History:   reports that he quit smoking about 12 years ago. His smoking use included cigarettes. He has never used smokeless tobacco. He reports that he does not drink alcohol and does not use drugs.   Family History:  His family history includes Hypertension in his brother, brother, brother, brother, brother, father, mother, sister, sister, sister, sister, sister, and sister.   Allergies Allergies  Allergen Reactions   Brilinta [Ticagrelor] Shortness Of Breath    D/c by cardiology     Home Medications  Prior to Admission medications   Medication Sig Start Date End  Date Taking? Authorizing Provider  acetaminophen (TYLENOL) 325 MG tablet Take 650 mg by mouth every 6 (six) hours as needed.    [provider]  albuterol (VENTOLIN HFA) 108 (90 Base) MCG/ACT inhaler Inhale 2 puffs into the lungs every 6 (six) hours as needed for wheezing or shortness of breath. 12/11/20   Jon Billings, NP  ASPIRIN LOW DOSE 81 MG EC tablet TAKE ONE TABLET BY MOUTH ONCE DAILY 11/14/20   Jon Billings, NP  atorvastatin (LIPITOR) 80 MG tablet TAKE ONE TABLET BY MOUTH AT BEDTIME 10/10/20   Jon Billings, NP  clopidogrel (PLAVIX) 75 MG tablet TAKE ONE TABLET BY MOUTH EVERY MORNING 09/18/20   Jon Billings, NP  Dulaglutide (TRULICITY) 5.73 UK/0.2RK SOPN Inject 0.75 mg into the skin once a week. Taking on Wednesdays    [provider]  ezetimibe (ZETIA) 10 MG tablet TAKE ONE TABLET BY MOUTH ONCE DAILY 11/14/20    Jon Billings, NP  finasteride (PROSCAR) 5 MG tablet Take 1 tablet (5 mg total) by mouth daily. 12/11/20   Jon Billings, NP  fluticasone (FLONASE) 50 MCG/ACT nasal spray Place 1 spray into both nostrils 2 (two) times daily. Pt takes twice a day 12/11/20   Jon Billings, NP  Fluticasone-Umeclidin-Vilant (TRELEGY ELLIPTA) 100-62.5-25 MCG/INH AEPB Inhale 1 puff into the lungs daily. 08/03/20   Jon Billings, NP  furosemide (LASIX) 40 MG tablet Take 60 mg by mouth daily. 12/28/19   [provider]  glipiZIDE (GLUCOTROL) 5 MG tablet TAKE ONE TABLET BY MOUTH EVERY MORNING 10/10/20   Jon Billings, NP  Insulin Glargine (BASAGLAR KWIKPEN) 100 UNIT/ML Inject 40 Units into the skin in the morning.    [provider]  Insulin Pen Needle (PEN NEEDLES 5/16") 30G X 8 MM MISC 1 each by Does not apply route daily. 02/21/20   Johnson, Megan P, DO  JARDIANCE 25 MG TABS tablet TAKE ONE TABLET BY MOUTH ONCE DAILY 08/16/20   Wynetta Emery, Megan P, DO  losartan (COZAAR) 100 MG tablet Take 1 tablet (100 mg total) by mouth daily. 07/26/20 01/30/21  Jon Billings, NP  metFORMIN (GLUCOPHAGE-XR) 500 MG 24 hr tablet Take 2 tablets (1,000 mg total) by mouth 2 (two) times daily. 07/26/20   Jon Billings, NP  metoprolol succinate (TOPROL-XL) 25 MG 24 hr tablet  12/29/19   [provider]  metoprolol succinate (TOPROL-XL) 50 MG 24 hr tablet Take by mouth. 08/20/20 08/20/21  [provider]  Multiple Vitamin (MULTIVITAMIN) TABS TAKE ONE TABLET BY MOUTH ONCE DAILY 07/23/20   Jon Billings, NP  nitroGLYCERIN (NITROSTAT) 0.4 MG SL tablet Place 1 tablet (0.4 mg total) under the tongue every 5 (five) minutes as needed for chest pain. 07/26/20   Jon Billings, NP  OneTouch Delica Lancets 27C MISC 1 each by Does not apply route daily. 01/06/20   Johnson, Megan P, DO  spironolactone (ALDACTONE) 25 MG tablet Take 1 tablet (25 mg total) by mouth daily. 07/26/20 01/30/21  Jon Billings, NP      Critical care time: 40 minutes   The patient is critically ill with multiple organ system failure and requires high complexity decision making for assessment and support, frequent evaluation and titration of therapies, advanced monitoring, review of radiographic studies and interpretation of complex data.    Darel Hong, AGACNP-BC [facility] Prefer epic messenger for cross cover needs If after hours, please call E-link

## 2021-01-30 NOTE — Progress Notes (Signed)
Inpatient Diabetes Program Recommendations  AACE/ADA: New Consensus Statement on Inpatient Glycemic Control (2015)  Target Ranges:  Prepandial:   less than 140 mg/dL      Peak postprandial:   less than 180 mg/dL (1-2 hours)      Critically ill patients:  140 - 180 mg/dL   Lab Results  Component Value Date   GLUCAP 260 (H) 01/30/2021   HGBA1C 6.5 (H) 02-12-2021    Review of Glycemic Control Results for Benjamin Chan, Benjamin Chan (MRN [id number]) as of 01/30/2021 11:19  Ref. Range 12-Feb-2021 20:35 01/30/2021 00:13 01/30/2021 03:58 01/30/2021 04:18 01/30/2021 07:10  Glucose-Capillary Latest Ref Range: 70 - 99 mg/dL 035 (H) 009 (H) 381 (H) 285 (H) 260 (H)   Diabetes history: DM2 Outpatient Diabetes medications: Basaglar 40 units qd, Glucotrol 5 mg qd, Metformin 1 gm bid, Jardiance 25 mg, Trulicity 0.75 q week Current orders for Inpatient glycemic control: Novolog 0-15 units q 4 hrs.  Inpatient Diabetes Program Recommendations:   Please consider ICU Glycemic control order set with IV insulin  Thank you, Billy Fischer. Rune Mendez, RN, MSN, CDE  Diabetes Coordinator Inpatient Glycemic Control Team Team Pager [phone] (8am-5pm) 01/30/2021 11:19 AM

## 2021-01-30 NOTE — Progress Notes (Signed)
Initial Nutrition Assessment  DOCUMENTATION CODES:   Obesity unspecified  INTERVENTION:   Initiate TF via OGT:   Initiate Vital High Protein @ 25 ml/hr via OGT and increase by 10 ml every 4 hours to goal rate of 45 ml/hr. (1080 ml daily)  90 ml Prostat BID.    30 ml free water flush every 4 hours for tube patency  Tube feeding regimen provides 1240 kcal (100% of needs), 138 grams of protein, and 903 ml of H2O.  Total free water: 1083 ml daily  NUTRITION DIAGNOSIS:   Inadequate oral intake related to inability to eat as evidenced by NPO status.  GOAL:   Patient will meet greater than or equal to 90% of their needs  MONITOR:   Vent status, Labs, Weight trends, Skin, I & O's  REASON FOR ASSESSMENT:   Ventilator    ASSESSMENT:   75 year old with history of hypertension, diabetes, CHF, coronary artery disease status post CABG, hyperlipidemia, BPH, CKD.  Presenting with chest pain, inferior ST elevation MI.  He was intubated in the emergency room.  Evaluated by cardiology and taken to the Cath Lab from the ED  Pt admitted with NSTEMI.   Patient is currently intubated on ventilator support MV: 13.1 L/min Temp (24hrs), Avg:98.2 F (36.8 C), Min:96.7 F (35.9 C), Max:99.3 F (37.4 C)  Reviewed I/O's: +34 ml x 24 hours  UOP: 730 ml x 24 hours  Case discussed with MD, RN, and during ICU rounds. Pt underwent EKG, which revealed STEMI. Pt went to cath lab, which revealed fluid overload. He is following commands.   Per MD, plan to start TF today. Pt with OGT, which is currently clamped.   Spoke with pt wife at bedside, who reports pt was eating a drinking well PTA. He usually consumes 2 meals per day (meat, starch, and vegetables) and snacks on potato chips. Wife reports that pt is very active and enjoys mowing the lawn and does the majority of the household chores.    Reviewed wt hx; pt wt has been stable over the past year. Per pt wife, his UBW is around 200# and pt is  actively trying to lose weight to maintain a healthy lifestyle.   Medications reviewed and include fentanyl and levophed.   Lab Results  Component Value Date   HGBA1C 6.5 (H) 01/19/2021   PTA DM medications are 0.75 mg duaglutide daily, 5 mg glipizide daily, 40 units insulin glargine daily, and 500 mg metformin BID.   Labs reviewed: CBGS: 202-260 (inpatient orders for glycemic control are 0-15 units insulin aspart every 4 hours).    NUTRITION - FOCUSED PHYSICAL EXAM:  Flowsheet Row Most Recent Value  Orbital Region No depletion  Upper Arm Region No depletion  Thoracic and Lumbar Region No depletion  Buccal Region No depletion  Temple Region Mild depletion  Clavicle Bone Region No depletion  Clavicle and Acromion Bone Region No depletion  Scapular Bone Region No depletion  Dorsal Hand No depletion  Patellar Region No depletion  Anterior Thigh Region No depletion  Posterior Calf Region No depletion  Edema (RD Assessment) Mild  Hair Reviewed  Eyes Reviewed  Mouth Reviewed  Skin Reviewed  Nails Reviewed       Diet Order:   Diet Order             Diet NPO time specified  Diet effective now                   EDUCATION NEEDS:  Education needs have been addressed  Skin:  Skin Assessment: Reviewed RN Assessment  Last BM:  Unknown  Height:   Ht Readings from Last 1 Encounters:  02/02/2021 5\' 6"  (1.676 m)    Weight:   Wt Readings from Last 1 Encounters:  01/30/2021 88.3 kg    Ideal Body Weight:  64.5 kg  BMI:  Body mass index is 31.42 kg/m.  Estimated Nutritional Needs:   Kcal:  01/31/21  Protein:  > 129 grams  Fluid:  > 1 L    [phone], RD, LDN, CDCES Registered Dietitian II Certified Diabetes Care and Education Specialist Please refer to [facility] for RD and/or RD on-call/weekend/after hours pager

## 2021-01-30 NOTE — Progress Notes (Signed)
*  PRELIMINARY RESULTS* Echocardiogram 2D Echocardiogram has been performed.  Joanette Gula Laney Louderback 01/30/2021, 11:29 AM

## 2021-01-30 NOTE — Procedures (Signed)
Central Venous Catheter Insertion Procedure Note  Benjamin Chan  [id number]  1945/10/02  Date:01/30/21  Time:1:59 AM   Provider Performing:Jahkeem Kurka A [name]   Procedure: Insertion of Non-tunneled Central Venous [phone]) with US guidance ([id number])   Indication(s) Medication administration and Difficult access  Consent Risks of the procedure as well as the alternatives and risks of each were explained to the patient and/or caregiver.  Consent for the procedure was obtained and is signed in the bedside chart  Anesthesia Topical only with 1% lidocaine   Timeout Verified patient identification, verified procedure, site/side was marked, verified correct patient position, special equipment/implants available, medications/allergies/relevant history reviewed, required imaging and test results available.  Sterile Technique Maximal sterile technique including full sterile barrier drape, hand hygiene, sterile gown, sterile gloves, mask, hair covering, sterile ultrasound probe cover (if used).  Procedure Description Area of catheter insertion was cleaned with chlorhexidine and draped in sterile fashion.  With real-time ultrasound guidance a central venous catheter was placed into the right internal jugular vein. Nonpulsatile blood flow and easy flushing noted in all ports.  The catheter was sutured in place and sterile dressing applied.  Complications/Tolerance None; patient tolerated the procedure well. Chest X-ray is ordered to verify placement for internal jugular or subclavian cannulation.   Chest x-ray is not ordered for femoral cannulation.  EBL Minimal  Specimen(s) None    [name], DNP, CCRN, FNP-C, AGACNP-BC Acute Care Nurse Practitioner  [facility] Pager: [phone] [place] [facility]

## 2021-01-30 NOTE — Procedures (Signed)
Arterial Catheter Insertion Procedure Note  Benjamin Chan  [id number]  29-Sep-1945  Date:01/30/21  Time:1:30 PM    Provider Performing: Judithe Modest    Procedure: Insertion of Arterial Line (37342) with US guidance (87681)   Indication(s) Blood pressure monitoring and/or need for frequent ABGs  Consent Risks of the procedure as well as the alternatives and risks of each were explained to the patient and/or caregiver.  Consent for the procedure was obtained and is signed in the bedside chart  Anesthesia None   Time Out Verified patient identification, verified procedure, site/side was marked, verified correct patient position, special equipment/implants available, medications/allergies/relevant history reviewed, required imaging and test results available.   Sterile Technique Maximal sterile technique including full sterile barrier drape, hand hygiene, sterile gown, sterile gloves, mask, hair covering, sterile ultrasound probe cover (if used).   Procedure Description Area of catheter insertion was cleaned with chlorhexidine and draped in sterile fashion. With real-time ultrasound guidance an arterial catheter was placed into the right femoral artery.  Appropriate arterial tracings confirmed on monitor.     Complications/Tolerance None; patient tolerated the procedure well.   EBL Minimal   Specimen(s) None  BIOPATCH applied to the insertion site.   Harlon Ditty, AGACNP-BC [facility] Prefer epic messenger for cross cover needs If after hours, please call E-link

## 2021-01-31 ENCOUNTER — Inpatient Hospital Stay: Payer: Medicare HMO

## 2021-01-31 DIAGNOSIS — I213 ST elevation (STEMI) myocardial infarction of unspecified site: Secondary | ICD-10-CM | POA: Diagnosis not present

## 2021-01-31 DIAGNOSIS — G928 Other toxic encephalopathy: Secondary | ICD-10-CM | POA: Diagnosis not present

## 2021-01-31 DIAGNOSIS — G9341 Metabolic encephalopathy: Secondary | ICD-10-CM | POA: Diagnosis not present

## 2021-01-31 DIAGNOSIS — R570 Cardiogenic shock: Secondary | ICD-10-CM | POA: Diagnosis not present

## 2021-01-31 DIAGNOSIS — R2990 Unspecified symptoms and signs involving the nervous system: Secondary | ICD-10-CM

## 2021-01-31 DIAGNOSIS — I2119 ST elevation (STEMI) myocardial infarction involving other coronary artery of inferior wall: Secondary | ICD-10-CM | POA: Diagnosis not present

## 2021-01-31 LAB — BLOOD CULTURE ID PANEL (REFLEXED) - BCID2

## 2021-01-31 LAB — PHOSPHORUS
Phosphorus: 4 mg/dL (ref 2.5–4.6)
Phosphorus: 4.6 mg/dL (ref 2.5–4.6)

## 2021-01-31 LAB — BASIC METABOLIC PANEL
Anion gap: 11 (ref 5–15)
BUN: 49 mg/dL — ABNORMAL HIGH (ref 8–23)
CO2: 23 mmol/L (ref 22–32)
Calcium: 8.2 mg/dL — ABNORMAL LOW (ref 8.9–10.3)
Chloride: 103 mmol/L (ref 98–111)
Creatinine, Ser: 2.98 mg/dL — ABNORMAL HIGH (ref 0.61–1.24)
GFR, Estimated: 21 mL/min — ABNORMAL LOW (ref >60)
Glucose, Bld: 236 mg/dL — ABNORMAL HIGH (ref 70–99)
Potassium: 4.7 mmol/L (ref 3.5–5.1)
Sodium: 137 mmol/L (ref 135–145)

## 2021-01-31 LAB — GLUCOSE, CAPILLARY
Glucose-Capillary: 208 mg/dL — ABNORMAL HIGH (ref 70–99)
Glucose-Capillary: 251 mg/dL — ABNORMAL HIGH (ref 70–99)
Glucose-Capillary: 275 mg/dL — ABNORMAL HIGH (ref 70–99)
Glucose-Capillary: 179 mg/dL — ABNORMAL HIGH (ref 70–99)
Glucose-Capillary: 191 mg/dL — ABNORMAL HIGH (ref 70–99)
Glucose-Capillary: 222 mg/dL — ABNORMAL HIGH (ref 70–99)

## 2021-01-31 LAB — CBC
HCT: 40.7 % (ref 39.0–52.0)
Hemoglobin: 13.3 g/dL (ref 13.0–17.0)
MCH: 31.1 pg (ref 26.0–34.0)
MCHC: 32.7 g/dL (ref 30.0–36.0)
MCV: 95.1 fL (ref 80.0–100.0)
Platelets: 218 10*3/uL (ref 150–400)
RBC: 4.28 MIL/uL (ref 4.22–5.81)
RDW: 12.9 % (ref 11.5–15.5)
WBC: 18.4 10*3/uL — ABNORMAL HIGH (ref 4.0–10.5)
nRBC: 0 % (ref 0.0–0.2)

## 2021-01-31 LAB — PROCALCITONIN: Procalcitonin: 1.85 ng/mL

## 2021-01-31 LAB — MAGNESIUM
Magnesium: 2.4 mg/dL (ref 1.7–2.4)
Magnesium: 2.4 mg/dL (ref 1.7–2.4)

## 2021-01-31 LAB — HEPARIN LEVEL (UNFRACTIONATED)
Heparin Unfractionated: 0.56 IU/mL (ref 0.30–0.70)
Heparin Unfractionated: 0.38 IU/mL (ref 0.30–0.70)

## 2021-01-31 MED ORDER — ORAL CARE MOUTH RINSE
15.0000 mL | Freq: Two times a day (BID) | OROMUCOSAL | Status: DC
  Administered 2021-01-31 (×2): 15 mL via OROMUCOSAL

## 2021-01-31 MED ORDER — CHLORHEXIDINE GLUCONATE 0.12 % MT SOLN: 15.0000 mL | Freq: Two times a day (BID) | OROMUCOSAL | Status: DC

## 2021-01-31 MED ORDER — VITAL HIGH PROTEIN PO LIQD
1000.0000 mL | ORAL | Status: DC
  Administered 2021-01-31: 17:00:00 1000 mL

## 2021-01-31 MED ORDER — HEPARIN SODIUM (PORCINE) 5000 UNIT/ML IJ SOLN
5000.0000 [IU] | Freq: Three times a day (TID) | INTRAMUSCULAR | Status: DC
  Administered 2021-01-31 – 2021-02-01 (×2): 5000 [IU] via SUBCUTANEOUS
  Filled 2021-01-31 (×2): qty 1

## 2021-01-31 MED ORDER — ORAL CARE MOUTH RINSE
15.0000 mL | OROMUCOSAL | Status: DC
  Administered 2021-01-31 – 2021-02-01 (×9): 15 mL via OROMUCOSAL

## 2021-01-31 MED ORDER — CHLORHEXIDINE GLUCONATE 0.12% ORAL RINSE (MEDLINE KIT)
15.0000 mL | Freq: Two times a day (BID) | OROMUCOSAL | Status: DC
  Administered 2021-01-31 – 2021-02-01 (×2): 15 mL via OROMUCOSAL

## 2021-01-31 MED ORDER — PROSOURCE TF PO LIQD
90.0000 mL | Freq: Two times a day (BID) | ORAL | Status: DC
  Administered 2021-01-31: 21:00:00 90 mL
  Filled 2021-01-31: qty 90

## 2021-01-31 MED ORDER — FUROSEMIDE 10 MG/ML IJ SOLN
40.0000 mg | Freq: Every day | INTRAMUSCULAR | Status: DC
  Administered 2021-01-31: 13:00:00 40 mg via INTRAVENOUS
  Filled 2021-01-31: qty 4

## 2021-01-31 MED ORDER — DOCUSATE SODIUM 50 MG/5ML PO LIQD
100.0000 mg | Freq: Two times a day (BID) | ORAL | Status: DC
  Administered 2021-01-31 (×2): 100 mg
  Filled 2021-01-31 (×2): qty 10

## 2021-01-31 MED ORDER — SODIUM CHLORIDE 0.9 % IV SOLN
2.0000 g | INTRAVENOUS | Status: DC
  Administered 2021-01-31: 11:00:00 2 g via INTRAVENOUS
  Filled 2021-01-31: qty 2
  Filled 2021-01-31: qty 20

## 2021-01-31 MED ORDER — POLYETHYLENE GLYCOL 3350 17 G PO PACK
17.0000 g | PACK | Freq: Every day | ORAL | Status: DC
  Administered 2021-01-31: 13:00:00 17 g
  Filled 2021-01-31: qty 1

## 2021-01-31 MED ORDER — FENTANYL CITRATE PF 50 MCG/ML IJ SOSY
50.0000 ug | PREFILLED_SYRINGE | INTRAMUSCULAR | Status: DC | PRN
  Administered 2021-02-01 (×2): 50 ug via INTRAVENOUS
  Administered 2021-02-01: 100 ug via INTRAVENOUS
  Filled 2021-01-31: qty 1
  Filled 2021-01-31: qty 2
  Filled 2021-01-31 (×2): qty 1

## 2021-01-31 MED ORDER — FENTANYL CITRATE PF 50 MCG/ML IJ SOSY: 50.0000 ug | PREFILLED_SYRINGE | INTRAMUSCULAR | Status: DC | PRN

## 2021-01-31 MED ORDER — PROPOFOL 1000 MG/100ML IV EMUL
5.0000 ug/kg/min | INTRAVENOUS | Status: DC
  Administered 2021-01-31: 21:00:00 5 ug/kg/min via INTRAVENOUS
  Administered 2021-02-01: 15 ug/kg/min via INTRAVENOUS
  Filled 2021-01-31 (×2): qty 100

## 2021-01-31 MED ORDER — FENTANYL 2500MCG IN NS 250ML (10MCG/ML) PREMIX INFUSION
INTRAVENOUS | Status: AC
  Filled 2021-01-31: qty 250

## 2021-01-31 MED ORDER — AZITHROMYCIN 500 MG PO TABS
500.0000 mg | ORAL_TABLET | Freq: Every day | ORAL | Status: DC
  Administered 2021-01-31: 11:00:00 500 mg
  Filled 2021-01-31 (×2): qty 1

## 2021-01-31 MED ORDER — PROSOURCE TF PO LIQD
45.0000 mL | Freq: Two times a day (BID) | ORAL | Status: DC
  Filled 2021-01-31 (×2): qty 45

## 2021-01-31 MED ORDER — STROKE: EARLY STAGES OF RECOVERY BOOK: Freq: Once | Status: DC

## 2021-01-31 MED ORDER — VITAL HIGH PROTEIN PO LIQD: 1000.0000 mL | ORAL | Status: DC

## 2021-01-31 MED ORDER — AZITHROMYCIN 500 MG PO TABS
500.0000 mg | ORAL_TABLET | Freq: Every day | ORAL | Status: DC
  Filled 2021-01-31: qty 1

## 2021-01-31 NOTE — Progress Notes (Signed)
Patient transported to MRI. No issues with transport.

## 2021-01-31 NOTE — Progress Notes (Signed)
Patient's gold-colored ring removed from left hand due to swelling and placed in a labeled specimen cup in the ICU cabinet (ICU room 05).  Carmel Sacramento, RN

## 2021-01-31 NOTE — Progress Notes (Signed)
Pt opened eyes to voice this shift. Never tracked RN or followed commands during WUA and SBT. To and from head CT and MRI. Sedation back on due to ST 130's and becoming diaphoretic, dyssynchrony with ventilator when stimulated. Levo remains on due to sedation. Foley intact with adequate output. Art and central lines intact. DNR band placed on pt per orders. Pt to have end of life visitation privileges at this time.

## 2021-01-31 NOTE — Progress Notes (Signed)
Inpatient Diabetes Program Recommendations  AACE/ADA: New Consensus Statement on Inpatient Glycemic Control (2015)  Target Ranges:  Prepandial:   less than 140 mg/dL      Peak postprandial:   less than 180 mg/dL (1-2 hours)      Critically ill patients:  140 - 180 mg/dL    Latest Reference Range & Units 01/30/21 00:13 01/30/21 03:58 01/30/21 04:18 01/30/21 07:10 01/30/21 11:21 01/30/21 15:33 01/30/21 19:14  Glucose-Capillary 70 - 99 mg/dL 048 (H) 889 (H) 169 (H) 260 (H) 202 (H) 136 (H) 138 (H)    Latest Reference Range & Units 01/30/21 23:13 01/31/21 03:20 01/31/21 07:16  Glucose-Capillary 70 - 99 mg/dL 450 (H) 388 (H) 828 (H)     Home DM Meds: Basaglar 40 units BID     Glucotrol 5 mg daily      Metformin 1000 mg BID      Jardiance 25 mg daily      Trulicity 0.75 mg q week  Current Orders: Novolog Moderate Correction Scale/ SSI (0-15 units) Q4 hours    MD- Note patient getting tube feeds 45cc/hr.  Per Home Med Rec, takes Basaglar Insulin at home.  Please consider:  Start Levemir 10 units BID (0.2 units/kg)     --Will follow patient during hospitalization--  Ambrose Finland RN, MSN, CDE Diabetes Coordinator Inpatient Glycemic Control Team Team Pager: [phone] (8a-5p)

## 2021-01-31 NOTE — Progress Notes (Signed)
[facility]  Discussed MRI with neurology: Diffuse watershed and embolic appearing strokes. Would be helpful to repeat echo to look for LV thrombus.   Will order for tomorrow Will also need to be careful with heparin dosing given high risk for hemorrhagic conversion.  Heber [place], MD [facility] Pager: [phone] Cell: [phone] After 7:00 pm call Elink  [phone]

## 2021-01-31 NOTE — Progress Notes (Signed)
[facility]  ROUNDING NOTE   Subjective:   [name] was admitted to [facility] on 02/05/2021 for Cardiogenic shock (HCC) [R57.0] Acute respiratory failure with hypoxia (HCC) [J96.01] Unresponsive state [R41.89] ST elevation myocardial infarction (STEMI) involving other coronary artery of inferior wall (HCC) [I21.19] ST elevation myocardial infarction (STEMI), unspecified artery (HCC) [I21.3]  Patient was having chest pain with acute coronary syndrome. Found to have a STEMI and underwent left heart catheterization. Patietn with cardiogenic shock with acute respiratory failure requiring intubation and vasopressors.   Patient is established with nephrology, see me, Dr. Wynelle Link, for chronic kidney disease stage IIIB. Patient was last seen on 11/21/2020. Creatinine has trended to 2.98 from admission and nephrology was consulted.   UOP - IV furosemide 80mg  x 1 given yesterday.   Objective:  Vital signs in last 24 hours:  Temp:  [99 F (37.2 C)-100.4 F (38 C)] 99.2 F (37.3 C) (11/17 1100) Pulse Rate:  [78-115] 107 (11/17 1100) Resp:  [19-30] 26 (11/17 1100) BP: (86-116)/(58-69) 104/63 (11/17 1100) SpO2:  [95 %-100 %] 97 % (11/17 1100) Arterial Line BP: (96-127)/(54-68) 106/61 (11/17 1100) FiO2 (%):  [40 %-70 %] 40 % (11/17 1100) Weight:  [90.2 kg] 90.2 kg (11/17 0317)  Weight change: 1.9 kg Filed Weights   02/11/2021 1619 01/31/2021 1743 01/31/21 0317  Weight: 88.3 kg 88.3 kg 90.2 kg    Intake/Output: I/O last 3 completed shifts: In: 2108.5 [I.V.:2108.5] Out: 2305 [Urine:2305]   Intake/Output this shift:  Total I/O In: 1561.5 [I.V.:266; NG/GT:1195.5; IV Piggyback:100] Out: 550 [Urine:550]  Physical Exam: General: Critically ill  Head: ETT  Eyes: Anicteric, PERRL  Neck: trachea midline  Lungs:  PSV FiO2 40%  Heart: Regular rate and rhythm  Abdomen:  Soft, nontender, obese  Extremities:  no peripheral edema.  Neurologic: Intubated and sedated.    Skin: No lesions  Access: none    Basic Metabolic Panel: Recent Labs  Lab 01/24/2021 1640 01/30/21 0412 01/31/21 0328  NA 136 134* 137  K 3.3* 5.2* 4.7  CL 104 103 103  CO2 22 23 23   GLUCOSE 192* 302* 236*  BUN 32* 38* 49*  CREATININE 1.91* 2.64* 2.98*  CALCIUM 8.6* 8.0* 8.2*  MG 2.5* 2.4  --   PHOS  --  7.2*  --     Liver Function Tests: Recent Labs  Lab 01/30/2021 1640  AST 31  ALT 24  ALKPHOS 82  BILITOT 0.9  PROT 7.8  ALBUMIN 4.2   No results for input(s): LIPASE, AMYLASE in the last 168 hours. No results for input(s): AMMONIA in the last 168 hours.  CBC: Recent Labs  Lab 02/08/2021 1640 01/30/21 0412 01/31/21 0328  WBC 13.9* 18.4* 18.4*  NEUTROABS 8.2*  --   --   HGB 14.7 13.7 13.3  HCT 44.2 41.6 40.7  MCV 91.9 92.4 95.1  PLT 284 270 218    Cardiac Enzymes: No results for input(s): CKTOTAL, CKMB, CKMBINDEX, TROPONINI in the last 168 hours.  BNP: Invalid input(s): POCBNP  CBG: Recent Labs  Lab 01/30/21 1533 01/30/21 1914 01/30/21 2313 01/31/21 0320 01/31/21 0716  GLUCAP 136* 138* 217* 208* 251*    Microbiology: Results for orders placed or performed during the hospital encounter of 01/27/2021  Resp Panel by RT-PCR (Flu A&B, Covid)     Status: None   Collection Time: 01/15/2021  4:21 PM   Specimen: Nasopharyngeal(NP) swabs in vial transport medium  Result Value Ref Range Status   SARS Coronavirus 2  by RT PCR NEGATIVE NEGATIVE Final    Comment: (NOTE) SARS-CoV-2 target nucleic acids are NOT DETECTED.  The SARS-CoV-2 RNA is generally detectable in upper respiratory specimens during the acute phase of infection. The lowest concentration of SARS-CoV-2 viral copies this assay can detect is 138 copies/mL. A negative result does not preclude SARS-Cov-2 infection and should not be used as the sole basis for treatment or other patient management decisions. A negative result may occur with  improper specimen collection/handling, submission of  specimen other than nasopharyngeal swab, presence of viral mutation(s) within the areas targeted by this assay, and inadequate number of viral copies(<138 copies/mL). A negative result must be combined with clinical observations, patient history, and epidemiological information. The expected result is Negative.  Fact Sheet for Patients:  EntrepreneurPulse.com.au  Fact Sheet for Healthcare Providers:  IncredibleEmployment.be  This test is no t yet approved or cleared by the [place] FDA and  has been authorized for detection and/or diagnosis of SARS-CoV-2 by FDA under an Emergency Use Authorization (EUA). This EUA will remain  in effect (meaning this test can be used) for the duration of the COVID-19 declaration under Section 564(b)(1) of the Act, 21 U.S.C.section 360bbb-3(b)(1), unless the authorization is terminated  or revoked sooner.       Influenza A by PCR NEGATIVE NEGATIVE Final   Influenza B by PCR NEGATIVE NEGATIVE Final    Comment: (NOTE) The Xpert Xpress SARS-CoV-2/FLU/RSV plus assay is intended as an aid in the diagnosis of influenza from Nasopharyngeal swab specimens and should not be used as a sole basis for treatment. Nasal washings and aspirates are unacceptable for Xpert Xpress SARS-CoV-2/FLU/RSV testing.  Fact Sheet for Patients: EntrepreneurPulse.com.au  Fact Sheet for Healthcare Providers: IncredibleEmployment.be  This test is not yet approved or cleared by the [place] FDA and has been authorized for detection and/or diagnosis of SARS-CoV-2 by FDA under an Emergency Use Authorization (EUA). This EUA will remain in effect (meaning this test can be used) for the duration of the COVID-19 declaration under Section 564(b)(1) of the Act, 21 U.S.C. section 360bbb-3(b)(1), unless the authorization is terminated or revoked.  Performed at [facility], [address] [place], [place] [address]   Blood culture (single)     Status: None (Preliminary result)   Collection Time: 01/23/2021  6:14 PM   Specimen: BLOOD  Result Value Ref Range Status   Specimen Description BLOOD BLOOD LEFT HAND  Final   Special Requests   Final    BOTTLES DRAWN AEROBIC AND ANAEROBIC Blood Culture adequate volume   Culture   Final    NO GROWTH 2 DAYS Performed at [facility], [address], [place], [place] [address]    Report Status PENDING  Incomplete  MRSA Next Gen by PCR, Nasal     Status: None   Collection Time: 02/05/2021 10:53 PM   Specimen: Nasal Mucosa; Nasal Swab  Result Value Ref Range Status   MRSA by PCR Next Gen NOT DETECTED NOT DETECTED Final    Comment: (NOTE) The GeneXpert MRSA Assay (FDA approved for NASAL specimens only), is one component of a comprehensive MRSA colonization surveillance program. It is not intended to diagnose MRSA infection nor to guide or monitor treatment for MRSA infections. Test performance is not FDA approved in patients less than 20 years old. Performed at [facility], [place], [place] [address]   CULTURE, BLOOD (ROUTINE X 2) w Reflex to ID Panel  Status: None (Preliminary result)   Collection Time: 01/30/21 10:39 AM   Specimen: BLOOD  Result Value Ref Range Status   Specimen Description BLOOD BLOOD LEFT WRIST  Final   Special Requests   Final    BOTTLES DRAWN AEROBIC AND ANAEROBIC Blood Culture adequate volume   Culture   Final    NO GROWTH < 24 HOURS Performed at [facility], [address] [place], [place] [address]    Report Status PENDING  Incomplete  CULTURE, BLOOD (ROUTINE X 2) w Reflex to ID Panel     Status: None (Preliminary result)   Collection Time: 01/30/21 11:43 AM   Specimen: BLOOD  Result Value Ref Range Status   Specimen Description BLOOD BLOOD LEFT WRIST  Final   Special Requests   Final    BOTTLES DRAWN AEROBIC AND ANAEROBIC Blood Culture adequate  volume   Culture  Setup Time   Final    Organism ID to follow AEROBIC BOTTLE ONLY GRAM POSITIVE COCCI CRITICAL RESULT CALLED TO, READ BACK BY AND VERIFIED WITH: ALEX CHAPPELL @ [id number] ON 01/31/21.Marland KitchenMarland KitchenGM Performed at [facility], [address] [place], [place] [address]    Culture [facility] POSITIVE COCCI  Final   Report Status PENDING  Incomplete  Blood Culture ID Panel (Reflexed)     Status: Abnormal   Collection Time: 01/30/21 11:43 AM  Result Value Ref Range Status   Enterococcus faecalis NOT DETECTED NOT DETECTED Final   Enterococcus Faecium NOT DETECTED NOT DETECTED Final   Listeria monocytogenes NOT DETECTED NOT DETECTED Final   Staphylococcus species DETECTED (A) NOT DETECTED Final    Comment: CRITICAL RESULT CALLED TO, READ BACK BY AND VERIFIED WITH: ALEX CHAPPELL @ [id number] ON 01/31/21.Marland KitchenMarland KitchenGM    Staphylococcus aureus (BCID) NOT DETECTED NOT DETECTED Final   Staphylococcus epidermidis DETECTED (A) NOT DETECTED Final    Comment: Methicillin (oxacillin) resistant coagulase negative staphylococcus. Possible blood culture contaminant (unless isolated from more than one blood culture draw or clinical case suggests pathogenicity). No antibiotic treatment is indicated for blood  culture contaminants. CRITICAL RESULT CALLED TO, READ BACK BY AND VERIFIED WITH: ALEX CHAPPELL @ [id number] ON 01/31/21.Marland KitchenMarland KitchenGM    Staphylococcus lugdunensis NOT DETECTED NOT DETECTED Final   Streptococcus species NOT DETECTED NOT DETECTED Final   Streptococcus agalactiae NOT DETECTED NOT DETECTED Final   Streptococcus pneumoniae NOT DETECTED NOT DETECTED Final   Streptococcus pyogenes NOT DETECTED NOT DETECTED Final   A.calcoaceticus-baumannii NOT DETECTED NOT DETECTED Final   Bacteroides fragilis NOT DETECTED NOT DETECTED Final   Enterobacterales NOT DETECTED NOT DETECTED Final   Enterobacter cloacae complex NOT DETECTED NOT DETECTED Final   Escherichia coli NOT DETECTED NOT DETECTED Final   Klebsiella aerogenes  NOT DETECTED NOT DETECTED Final   Klebsiella oxytoca NOT DETECTED NOT DETECTED Final   Klebsiella pneumoniae NOT DETECTED NOT DETECTED Final   Proteus species NOT DETECTED NOT DETECTED Final   Salmonella species NOT DETECTED NOT DETECTED Final   Serratia marcescens NOT DETECTED NOT DETECTED Final   Haemophilus influenzae NOT DETECTED NOT DETECTED Final   Neisseria meningitidis NOT DETECTED NOT DETECTED Final   Pseudomonas aeruginosa NOT DETECTED NOT DETECTED Final   Stenotrophomonas maltophilia NOT DETECTED NOT DETECTED Final   Candida albicans NOT DETECTED NOT DETECTED Final   Candida auris NOT DETECTED NOT DETECTED Final   Candida glabrata NOT DETECTED NOT DETECTED Final   Candida krusei NOT DETECTED NOT DETECTED Final   Candida parapsilosis NOT DETECTED NOT DETECTED Final   Candida  tropicalis NOT DETECTED NOT DETECTED Final   Cryptococcus neoformans/gattii NOT DETECTED NOT DETECTED Final   Methicillin resistance mecA/C DETECTED (A) NOT DETECTED Final    Comment: CRITICAL RESULT CALLED TO, READ BACK BY AND VERIFIED WITH: ALEX CHAPPELL @ [id number] ON 01/31/21.Marland KitchenMarland KitchenGM Performed at [facility], [address] [place], [place] [address]   Culture, Respiratory w Gram Stain     Status: None (Preliminary result)   Collection Time: 01/30/21 12:07 PM   Specimen: Tracheal Aspirate; Respiratory  Result Value Ref Range Status   Specimen Description   Final    TRACHEAL ASPIRATE Performed at [facility], [address] [place], [place] [address]    Special Requests   Final    NONE Performed at [facility], [place], [place] [address]    Gram Stain   Final    FEW SQUAMOUS EPITHELIAL CELLS PRESENT MODERATE WBC SEEN FEW GRAM POSITIVE RODS MODERATE GRAM POSITIVE COCCI Performed at [facility], [place] [address], [place], [place] [address]    Culture PENDING  Incomplete   Report Status PENDING  Incomplete    Coagulation Studies: Recent Labs     02/03/2021 1640  LABPROT 13.7  INR 1.1    Urinalysis: Recent Labs    02/07/2021 1810  COLORURINE YELLOW*  LABSPEC 1.022  PHURINE 5.0  GLUCOSEU >=500*  HGBUR NEGATIVE  BILIRUBINUR NEGATIVE  KETONESUR NEGATIVE  PROTEINUR NEGATIVE  NITRITE NEGATIVE  LEUKOCYTESUR NEGATIVE      Imaging: CT HEAD WO CONTRAST (5MM)  Result Date: 02/07/2021 CLINICAL DATA:  Found unresponsive. EXAM: CT HEAD WITHOUT CONTRAST TECHNIQUE: Contiguous axial images were obtained from the base of the skull through the vertex without intravenous contrast. COMPARISON:  None. FINDINGS: Brain: No evidence of acute infarction, hemorrhage, hydrocephalus, extra-axial collection or mass lesion/mass effect. Scattered areas of low-density in the white matter is suggestive for chronic small vessel ischemic changes. Vascular: No hyperdense vessel or unexpected calcification. Skull: Normal. Negative for fracture or focal lesion. Sinuses/Orbits: Mucosal thickening in the ethmoid air cells. Mastoid air cells are aerated. There is probably fluid and soft tissue fullness in the posterior nasal cavity. Other: None. IMPRESSION: 1. No acute intracranial abnormality. 2. Evidence for chronic small vessel ischemic changes. Electronically Signed   By: Markus Daft M.D.   On: 01/23/2021 17:12   CARDIAC CATHETERIZATION  Result Date: 02/12/2021 Conclusions: Severe native coronary artery disease that is very similar to prior diagnostic catheterization from 06/2019.  This includes i95% proximal LAD stenosis, chronic total occlusion of the proximal LCx, and sequential 70% and 30% mid RCA stenoses. Patent LIMA-LAD with 20% mid graft stenosis. Patent SVG-D2 with 30% proximal graft disease. Chronically occluded SVG-OM2. Severely elevated left ventricular filling pressure (LVEDP ~35 mmHg). Recommendations: No obvious culprit for patient acute angina and subtle inferior ST elevation.  I suspect his symptoms and EKG findings may be due to acute on chronic  HFrEF complicated by worsening mitral regurgitation noted on bedside echocardiogram this evening. Wean norepinephrine as tolerated. Establish central venous access in ICU and obtain central venous O2 saturation.  Consider adding milrinone if SvO2 < 70%. Aggressive diuresis. Nelva Bush, MD [facility]  US Venous Img Lower Bilateral (DVT)  Result Date: 01/30/2021 CLINICAL DATA:  Bilateral lower extremity edema.  Evaluate for DVT. EXAM: BILATERAL LOWER EXTREMITY VENOUS DOPPLER ULTRASOUND TECHNIQUE: Gray-scale sonography with graded compression, as well as color Doppler and duplex ultrasound were performed to evaluate the lower extremity deep venous systems from the  level of the common femoral vein and including the common femoral, femoral, profunda femoral, popliteal and calf veins including the posterior tibial, peroneal and gastrocnemius veins when visible. The superficial great saphenous vein was also interrogated. Spectral Doppler was utilized to evaluate flow at rest and with distal augmentation maneuvers in the common femoral, femoral and popliteal veins. COMPARISON:  None. FINDINGS: RIGHT LOWER EXTREMITY Common Femoral Vein: No evidence of thrombus. Normal compressibility, respiratory phasicity and response to augmentation. Saphenofemoral Junction: No evidence of thrombus. Normal compressibility and flow on color Doppler imaging. Profunda Femoral Vein: No evidence of thrombus. Normal compressibility and flow on color Doppler imaging. Femoral Vein: No evidence of thrombus. Normal compressibility, respiratory phasicity and response to augmentation. Popliteal Vein: No evidence of thrombus. Normal compressibility, respiratory phasicity and response to augmentation. Calf Veins: No evidence of thrombus. Normal compressibility and flow on color Doppler imaging. Superficial Great Saphenous Vein: No evidence of thrombus. Normal compressibility. Venous Reflux:  None. Other Findings:  None. LEFT LOWER  EXTREMITY Common Femoral Vein: No evidence of thrombus. Normal compressibility, respiratory phasicity and response to augmentation. Saphenofemoral Junction: No evidence of thrombus. Normal compressibility and flow on color Doppler imaging. Profunda Femoral Vein: No evidence of thrombus. Normal compressibility and flow on color Doppler imaging. Femoral Vein: No evidence of thrombus. Normal compressibility, respiratory phasicity and response to augmentation. Popliteal Vein: No evidence of thrombus. Normal compressibility, respiratory phasicity and response to augmentation. Calf Veins: No evidence of thrombus. Normal compressibility and flow on color Doppler imaging. Superficial Great Saphenous Vein: No evidence of thrombus. Normal compressibility. Venous Reflux:  None. Other Findings:  None. IMPRESSION: No evidence of DVT within either lower extremity. Electronically Signed   By: Sandi Mariscal M.D.   On: 01/30/2021 14:26   DG Chest Port 1 View  Result Date: 01/31/2021 CLINICAL DATA:  Acute respiratory failure with hypoxia EXAM: PORTABLE CHEST 1 VIEW COMPARISON:  Chest radiograph dated January 30, 2021 FINDINGS: Stable mild cardiomegaly. Diffuse mild interstitial opacities concerning for pulmonary edema, unchanged. No large pleural effusion. Endotracheal tube, feeding tube and right IJ access central line are unchanged. IMPRESSION: 1.  Stable cardiomegaly with mild pulmonary vascular congestion. 2.  Lines and tubes are unchanged. Electronically Signed   By: Keane Police D.O.   On: 01/31/2021 08:40   DG Chest Port 1 View  Result Date: 01/30/2021 CLINICAL DATA:  Acute respiratory failure EXAM: PORTABLE CHEST 1 VIEW COMPARISON:  Chest radiograph 01/30/2021 FINDINGS: The endotracheal tube is in stable position. A right IJ central venous catheter is in stable position. Median sternotomy wires are again noted. The heart is enlarged, unchanged. Prominent interstitial markings are again seen which suggest mild  pulmonary interstitial edema. There is no new focal airspace disease. The right costophrenic angle is partially excluded, there is no large right effusion. There is no significant left effusion. There is no pneumothorax. IMPRESSION: Overall no significant interval change in lung aeration with stable mild pulmonary interstitial edema. Electronically Signed   By: Valetta Mole M.D.   On: 01/30/2021 08:00   DG Chest Port 1 View  Result Date: 01/26/2021 CLINICAL DATA:  Central line placement EXAM: PORTABLE CHEST 1 VIEW COMPARISON:  01/23/2021 FINDINGS: Endotracheal tube is 3 cm above the carina. Right central line tip is at the cavoatrial junction. No pneumothorax. NG tube is in the stomach. Cardiomegaly. Bibasilar atelectasis. Interstitial prominence again noted may reflect interstitial edema. IMPRESSION: Right central line placement with the tip at the cavoatrial junction. No pneumothorax. Otherwise no change. Electronically  Signed   By: Rolm Baptise M.D.   On: 01/30/2021 22:45   DG Chest Port 1 View  Result Date: 02/09/2021 CLINICAL DATA:  OG tube placed none. EXAM: PORTABLE CHEST 1 VIEW COMPARISON:  Radiograph earlier today FINDINGS: Tip and side port of the enteric tube are below the diaphragm in the stomach. Endotracheal tube tip just below the clavicular heads. Post median sternotomy. Stable cardiomegaly. Similar interstitial thickening suspicious for pulmonary edema, equivocal worsening on the right. No convincing pleural effusion. No pneumothorax. IMPRESSION: 1. Tip and side port of the enteric tube below the diaphragm in the stomach. Endotracheal tube tip just below the clavicular heads. 2. Stable cardiomegaly. Similar interstitial thickening suspicious for pulmonary edema, equivocal worsening on the right. Electronically Signed   By: Keith Rake M.D.   On: 01/28/2021 21:31   DG Chest Portable 1 View  Result Date: 01/28/2021 CLINICAL DATA:  Intubation, chest pain EXAM: PORTABLE CHEST 1 VIEW  COMPARISON:  Chest radiograph 01/05/2020 FINDINGS: The endotracheal tube tip is approximately 3.5 cm from the carina. Median sternotomy wires and mediastinal surgical clips are again seen. The heart is enlarged, unchanged. The upper mediastinal contours are within normal limits. There are increased interstitial opacities throughout both lungs. There is no focal consolidation. There is no pleural effusion or pneumothorax. There is no acute osseous abnormality. IMPRESSION: 1. Increased interstitial opacities throughout both lungs favored to reflect pulmonary interstitial edema with differential including viral/atypical infection. 2. Unchanged cardiomegaly. Electronically Signed   By: Valetta Mole M.D.   On: 01/31/2021 17:04   ECHOCARDIOGRAM COMPLETE  Result Date: 01/30/2021    ECHOCARDIOGRAM REPORT   Patient Name:   Benjamin Chan Date of Exam: 01/30/2021 Medical Rec #:  [id number]          Height:       66.0 in Accession #:    [id number]         Weight:       194.7 lb Date of Birth:  08/01/1945          BSA:          1.977 m Patient Age:    75 years           BP:           100/65 mmHg Patient Gender: M                  HR:           82 bpm. Exam Location:  [facility] Procedure: 2D Echo, Color Doppler, Cardiac Doppler and Intracardiac            Opacification Agent Indications:     I24.9 Acute ischemic heart disease, unspecified  History:         Patient has prior history of Echocardiogram examinations, most                  recent 07/15/2019. CAD and Previous Myocardial Infarction, CKD                  and COPD; Risk Factors:Diabetes.  Sonographer:     Charmayne Sheer Referring Phys:  [id number] CHRISTOPHER END Diagnosing Phys: Kate Sable MD  Sonographer Comments: Echo performed with patient supine and on artificial respirator, suboptimal parasternal window and suboptimal subcostal window. IMPRESSIONS  1. Left ventricular ejection fraction, by estimation, is 25 to 30%. Left ventricular ejection fraction by 2D MOD  biplane is 26.0 %. The left ventricle has severely decreased function. The left  ventricle demonstrates global hypokinesis. Left ventricular diastolic parameters are consistent with Grade II diastolic dysfunction (pseudonormalization).  2. Right ventricular systolic function is low normal. The right ventricular size is not well visualized.  3. Left atrial size was mildly dilated.  4. The mitral valve is grossly normal. Mild to moderate mitral valve regurgitation.  5. Tricuspid valve regurgitation is mild to moderate.  6. The aortic valve was not well visualized. Aortic valve regurgitation is not visualized. FINDINGS  Left Ventricle: Left ventricular ejection fraction, by estimation, is 25 to 30%. Left ventricular ejection fraction by 2D MOD biplane is 26.0 %. The left ventricle has severely decreased function. The left ventricle demonstrates global hypokinesis. Definity contrast agent was given IV to delineate the left ventricular endocardial borders. The left ventricular internal cavity size was normal in size. There is no left ventricular hypertrophy. Left ventricular diastolic parameters are consistent with Grade II diastolic dysfunction (pseudonormalization). Right Ventricle: The right ventricular size is not well visualized. No increase in right ventricular wall thickness. Right ventricular systolic function is low normal. Left Atrium: Left atrial size was mildly dilated. Right Atrium: Right atrial size was normal in size. Pericardium: There is no evidence of pericardial effusion. Mitral Valve: The mitral valve is grossly normal. Mild to moderate mitral valve regurgitation. MV peak gradient, 4.4 mmHg. The mean mitral valve gradient is 2.0 mmHg. Tricuspid Valve: The tricuspid valve is grossly normal. Tricuspid valve regurgitation is mild to moderate. Aortic Valve: The aortic valve was not well visualized. Aortic valve regurgitation is not visualized. Aortic valve mean gradient measures 9.0 mmHg. Aortic valve peak  gradient measures 15.7 mmHg. Aortic valve area, by VTI measures 1.90 cm. Pulmonic Valve: The pulmonic valve was not well visualized. Pulmonic valve regurgitation is not visualized. Aorta: The aortic root is normal in size and structure. Venous: IVC assessment for right atrial pressure unable to be performed due to mechanical ventilation. IAS/Shunts: No atrial level shunt detected by color flow Doppler.  LEFT VENTRICLE PLAX 2D                        Biplane EF (MOD) LVOT diam:     2.30 cm         LV Biplane EF:   Left LV SV:         49                               ventricular LV SV Index:   25                               ejection LVOT Area:     4.15 cm                         fraction by                                                 2D MOD  biplane is LV Volumes (MOD)                                26.0 %. LV vol d, MOD    178.0 ml A2C:                           Diastology LV vol d, MOD    204.0 ml      LV e' medial:    4.90 cm/s A4C:                           LV E/e' medial:  21.8 LV vol s, MOD    135.0 ml      LV e' lateral:   5.87 cm/s A2C:                           LV E/e' lateral: 18.2 LV vol s, MOD    155.0 ml A4C: LV SV MOD A2C:   43.0 ml LV SV MOD A4C:   204.0 ml LV SV MOD BP:    50.6 ml RIGHT VENTRICLE RV Basal diam:  3.10 cm RV S prime:     9.57 cm/s LEFT ATRIUM             Index        RIGHT ATRIUM           Index LA Vol (A2C):   86.4 ml 43.70 ml/m  RA Area:     20.00 cm LA Vol (A4C):   61.5 ml 31.11 ml/m  RA Volume:   55.00 ml  27.82 ml/m LA Biplane Vol: 73.1 ml 36.98 ml/m  AORTIC VALVE AV Area (Vmax):    1.92 cm AV Area (Vmean):   1.75 cm AV Area (VTI):     1.90 cm AV Vmax:           198.00 cm/s AV Vmean:          136.000 cm/s AV VTI:            0.258 m AV Peak Grad:      15.7 mmHg AV Mean Grad:      9.0 mmHg LVOT Vmax:         91.40 cm/s LVOT Vmean:        57.300 cm/s LVOT VTI:          0.118 m LVOT/AV VTI ratio: 0.46  AORTA Ao Root diam:  3.20 cm MITRAL VALVE MV Area (PHT): 4.26 cm     SHUNTS MV Area VTI:   1.79 cm     Systemic VTI:  0.12 m MV Peak grad:  4.4 mmHg     Systemic Diam: 2.30 cm MV Mean grad:  2.0 mmHg MV Vmax:       1.05 m/s MV Vmean:      58.5 cm/s MV Decel Time: 178 msec MV E velocity: 107.00 cm/s MV A velocity: 75.40 cm/s MV E/A ratio:  1.42 Kate Sable MD Electronically signed by Kate Sable MD Signature Date/Time: 01/30/2021/2:11:13 PM    Final      Medications:    sodium chloride     cefTRIAXone (ROCEPHIN)  IV Stopped (01/31/21 1102)   heparin 1,150 Units/hr (01/31/21 1106)   norepinephrine (LEVOPHED) Adult infusion Stopped (01/31/21 1047)    aspirin  81 mg Per Tube  Daily   atorvastatin  80 mg Per Tube QHS   azithromycin  500 mg Per Tube Daily   chlorhexidine  15 mL Mouth Rinse BID   Chlorhexidine Gluconate Cloth  6 each Topical Q0600   clopidogrel  75 mg Per Tube Daily   docusate  100 mg Per Tube BID   ezetimibe  10 mg Per Tube Daily   feeding supplement (PROSource TF)  45 mL Per Tube BID   feeding supplement (VITAL HIGH PROTEIN)  1,000 mL Per Tube Q24H   finasteride  5 mg Oral Daily   free water  30 mL Per Tube Q4H   insulin aspart  0-15 Units Subcutaneous Q4H   mouth rinse  15 mL Mouth Rinse q12n4p   pantoprazole (PROTONIX) IV  40 mg Intravenous Q24H   polyethylene glycol  17 g Per Tube Daily   sodium chloride flush  3 mL Intravenous Q12H   sodium chloride, acetaminophen, fentaNYL (SUBLIMAZE) injection, fentaNYL (SUBLIMAZE) injection, ipratropium-albuterol, midazolam, ondansetron (ZOFRAN) IV, sodium chloride flush  Assessment/ Plan:  Benjamin Chan is a 75 y.o. black male with hypertension, diabetes mellitus type II, congestive heart failure, coronary artery disease status post CABG, hyperlipidemia and BPH who is admitted to [facility] on 02/02/2021 for Cardiogenic shock (HCC) [R57.0] Acute respiratory failure with hypoxia (HCC) [J96.01] Unresponsive state [R41.89] ST elevation  myocardial infarction (STEMI) involving other coronary artery of inferior wall (HCC) [I21.19] ST elevation myocardial infarction (STEMI), unspecified artery (HCC) [I21.3]  Acute kidney injury on chronic kidney disease stage IIIB: baseline creatinine of 2.34, GFR of 28 on 11/21/2020. Chronic kidney disease secondary to diabetic nephropathy and hypertension. Acute kidney injury secondary to acute cardiorenal syndrome, IV contrast exposure and possible ATN. Responding to IV furosemide.  - Holding home empagliflozin, spironolactone, and losartan.  - IV furosemide 40mg  daily for now - No indication for renal replacement therapy.   Hypotension with cardiogenic shock: requiring vasopressors: norepinephrine. Appreciate cardiology and critical care input. Echocardiogram with EF of 25-30% and grade II diastolic dysfunction - holding beta blocker  Diabetes mellitus type II with chronic kidney disease: patient should be off metformin as an outpatient. With history of glycosuria. No history of proteinuria.  - Continue glucose control   LOS: 2 Deeya Richeson 11/17/202211:21 AM

## 2021-01-31 NOTE — TOC Initial Note (Signed)
Transition of Care [facility]) - Initial/Assessment Note    Patient Details  Name: Benjamin Chan MRN: [id number] Date of Birth: March 30, 1945  [facility]) CM/SW Contact:    Hetty Ely, RN Phone Number: 01/31/2021, 3:39 PM  Clinical Narrative: Patient off unit getting MRI, spoke with wife, Frazier Richards at bedside who says patient was independent prior to hospital admission. No assisted device used, no problems with getting medications at the [facility]. PCP in [place]. Never used [facility] services. TOC to continue to track.                        Patient Goals and CMS Choice        Expected Discharge Plan and Services                                                Prior Living Arrangements/Services                       Activities of Daily Living      Permission Sought/Granted                  Emotional Assessment              Admission diagnosis:  Cardiogenic shock (HCC) [R57.0] Acute respiratory failure with hypoxia (HCC) [J96.01] Unresponsive state [R41.89] ST elevation myocardial infarction (STEMI) involving other coronary artery of inferior wall (HCC) [I21.19] ST elevation myocardial infarction (STEMI), unspecified artery (HCC) [I21.3] Patient Active Problem List   Diagnosis Date Noted   ST elevation myocardial infarction (STEMI), unspecified artery (HCC) 02/06/2021   Cardiogenic shock (HCC)    Aortic atherosclerosis (HCC) 06/06/2020   Chronic congestive heart failure (HCC) 01/05/2020   HFrEF (heart failure with reduced ejection fraction) (HCC) 12/26/2019   STEMI (ST elevation myocardial infarction) (HCC) 07/14/2019   Special screening for malignant neoplasms, colon    CKD stage 3 due to type 2 diabetes mellitus (HCC) 11/23/2017   Hyperlipidemia 07/10/2017   Venous insufficiency of both lower extremities 06/10/2017   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 heart 04/14/2017   Stage 3  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 classification (HCC) 04/13/2017   BPH (benign prostatic hyperplasia) 04/13/2017   Benign hypertensive renal disease 04/13/2017   Type 2 diabetes mellitus with diabetic chronic kidney disease (HCC) 04/13/2017   History of myocardial infarction 04/13/2017   Diabetic neuropathy (HCC) 06/12/2016   Hypertension associated with diabetes (HCC) 08/26/2012   ASD (atrial septal defect), ostium secundum 10/24/1997   PCP:  Larae Grooms, NP Pharmacy:   [facility] [place], [place] - [address] [place] [address] Phone: [phone] Fax: [phone]     Social Determinants of Health (SDOH) Interventions    Readmission Risk Interventions No flowsheet data found.

## 2021-01-31 NOTE — Consult Note (Addendum)
Neurology Consultation  Reason for Consult: Altered mental status, abnormal CT head Referring Physician: Dr. Roselie Awkward, CCM  CC: Altered mental status, inability to follow commands, abnormal head CT  History is obtained from: Chart, patient's wife at bedside, current care team  HPI: Benjamin Chan is a 75 y.o. male past medical history of diabetes, CHF, coronary artery disease status post CABG, hyperlipidemia, CKD, BPH presented to the emergency room 1115 with chest pain and noted to have inferior STEMI, was found to be in cardiogenic shock as well.  Intubated in the ER, evaluated by cardiology, taken to cath lab, noted to have severe native coronary artery disease similar to prior diagnostic catheterization from April 2021 including 95% proximal artery stenosis, chronic total occlusion of the proximal left circumflex and sequential 70% and 30% mid RCA stenosis.  No emergent intervention performed.  He was subsequently admitted to the ICU.  He was being managed by critical care, yesterday according to the team, he was following some simple commands and would arouse on wake-up assessments but this morning on rounds, he would not wake up or follow any commands.  He was sent for stat CT of the head that showed new hypodensities in bilateral cerebellar as well as right parieto-occipital area.  Neurological consultation was obtained in the light of those findings. Wife reports that he has not been following much commands for her even yesterday.   LKW: Sometime yesterday midmorning tpa given?: no, unclear last known well. Premorbid modified Rankin scale (mRS): 1-2 ROS: Unable to obtain due to altered mental status.  NIHSS - 30 Past Medical History:  Diagnosis Date   Arthritis    shoulder   BPH (benign prostatic hyperplasia)    CAD (coronary artery disease)    Chronic kidney disease    COPD (chronic obstructive pulmonary disease) (HCC)    Diabetes mellitus without complication (HCC)     Diabetic ketoacidosis without coma associated with type 2 diabetes mellitus ([place])    Heart attack ([place]) 06/2019   Hypertension    MI (myocardial infarction) ([place]) 03/2017   Non-ST elevation myocardial infarction (NSTEMI), subendocardial infarction, subsequent episode of care ([place]) 04/14/2017   NSTEMI (non-ST elevated myocardial infarction) ([place]) 07/14/2019   Family History  Problem Relation Age of Onset   Hypertension Father    Hypertension Mother    Hypertension Sister    Hypertension Brother    Hypertension Brother    Hypertension Brother    Hypertension Brother    Hypertension Brother    Hypertension Sister    Hypertension Sister    Hypertension Sister    Hypertension Sister    Hypertension Sister     Social History:   reports that he quit smoking about 12 years ago. His smoking use included cigarettes. He has never used smokeless tobacco. He reports that he does not drink alcohol and does not use drugs.  Medications  Current Facility-Administered Medications:    0.9 %  sodium chloride infusion, 250 mL, Intravenous, PRN, End, Harrell Gave, MD   acetaminophen (TYLENOL) tablet 650 mg, 650 mg, Per Tube, Q4H PRN, End, Christopher, MD   aspirin chewable tablet 81 mg, 81 mg, Per Tube, Daily, End, Christopher, MD, 81 mg at 01/31/21 0847   atorvastatin (LIPITOR) tablet 80 mg, 80 mg, Per Tube, QHS, End, Christopher, MD, 80 mg at 01/30/21 2131   azithromycin (ZITHROMAX) tablet 500 mg, 500 mg, Per Tube, Daily, Benita Gutter, RPH, 500 mg at 01/31/21 1032   cefTRIAXone (ROCEPHIN) 2 g in  sodium chloride 0.9 % 100 mL IVPB, 2 g, Intravenous, Q24H, Max Fickle B, MD, Stopped at 01/31/21 1102   chlorhexidine (PERIDEX) 0.12 % solution 15 mL, 15 mL, Mouth Rinse, BID, McQuaid, Douglas B, MD   Chlorhexidine Gluconate Cloth 2 % PADS 6 each, 6 each, Topical, Q0600, Mannam, Praveen, MD, 6 each at 01/30/21 2204   clopidogrel (PLAVIX) tablet 75 mg, 75 mg, Per Tube, Daily, End, Cristal Deer, MD, 75  mg at 01/31/21 0847   docusate (COLACE) 50 MG/5ML liquid 100 mg, 100 mg, Per Tube, BID, Max Fickle B, MD, 100 mg at 01/31/21 1234   ezetimibe (ZETIA) tablet 10 mg, 10 mg, Per Tube, Daily, End, Cristal Deer, MD, 10 mg at 01/31/21 0847   feeding supplement (PROSource TF) liquid 90 mL, 90 mL, Per Tube, BID, McQuaid, Douglas B, MD   feeding supplement (VITAL HIGH PROTEIN) liquid 1,000 mL, 1,000 mL, Per Tube, Continuous, Max Fickle B, MD, Last Rate: 45 mL/hr at 01/31/21 1234, Restarted at 01/31/21 1234   fentaNYL (SUBLIMAZE) injection 50 mcg, 50 mcg, Intravenous, Q15 min PRN, Max Fickle B, MD   fentaNYL (SUBLIMAZE) injection 50-200 mcg, 50-200 mcg, Intravenous, Q30 min PRN, Max Fickle B, MD   finasteride (PROSCAR) tablet 5 mg, 5 mg, Oral, Daily, End, Christopher, MD, 5 mg at 01/31/21 0848   free water 30 mL, 30 mL, Per Tube, Q4H, Max Fickle B, MD, 30 mL at 01/31/21 1236   furosemide (LASIX) injection 40 mg, 40 mg, Intravenous, Daily, Kolluru, Sarath, MD, 40 mg at 01/31/21 1234   heparin injection 5,000 Units, 5,000 Units, Subcutaneous, Q8H, Callwood, Dwayne D, MD   insulin aspart (novoLOG) injection 0-15 Units, 0-15 Units, Subcutaneous, Q4H, Mannam, Praveen, MD, 8 Units at 01/31/21 1235   ipratropium-albuterol (DUONEB) 0.5-2.5 (3) MG/3ML nebulizer solution 3 mL, 3 mL, Nebulization, Q4H PRN, Mannam, Praveen, MD   MEDLINE mouth rinse, 15 mL, Mouth Rinse, q12n4p, McQuaid, Douglas B, MD, 15 mL at 01/31/21 1236   midazolam (VERSED) injection 2 mg, 2 mg, Intravenous, Q2H PRN, Mannam, Praveen, MD, 2 mg at 01/30/21 2150   norepinephrine (LEVOPHED) 16 mg in premix infusion, 0-40 mcg/min, Intravenous, Continuous, Omar Person, MD, Stopped at 01/31/21 1047   ondansetron (ZOFRAN) injection 4 mg, 4 mg, Intravenous, Q6H PRN, End, Cristal Deer, MD   pantoprazole (PROTONIX) injection 40 mg, 40 mg, Intravenous, Q24H, Mannam, Praveen, MD, 40 mg at 01/30/21 2215   polyethylene  glycol (MIRALAX / GLYCOLAX) packet 17 g, 17 g, Per Tube, Daily, McQuaid, Douglas B, MD   sodium chloride flush (NS) 0.9 % injection 3 mL, 3 mL, Intravenous, Q12H, End, Christopher, MD, 3 mL at 01/31/21 0849   sodium chloride flush (NS) 0.9 % injection 3 mL, 3 mL, Intravenous, PRN, End, Cristal Deer, MD   Exam: Current vital signs: BP 104/63   Pulse (!) 107   Temp 99.2 F (37.3 C) (Axillary)   Resp (!) 26   Ht 5\' 6"  (1.676 m)   Wt 90.2 kg   SpO2 97%   BMI 32.10 kg/m  Vital signs in last 24 hours: Temp:  [99 F (37.2 C)-100.4 F (38 C)] 99.2 F (37.3 C) (11/17 1100) Pulse Rate:  [78-115] 107 (11/17 1100) Resp:  [19-30] 26 (11/17 1100) BP: (86-116)/(58-69) 104/63 (11/17 1100) SpO2:  [95 %-100 %] 97 % (11/17 1100) Arterial Line BP: (96-127)/(54-68) 106/61 (11/17 1100) FiO2 (%):  [40 %-70 %] 40 % (11/17 1100) Weight:  [90.2 kg] 90.2 kg (11/17 0317) General: Intubated, on fentanyl HEENT: Normocephalic/atraumatic Lungs:  Clear, breathing over the ventilator Cardiovascular: Tachycardic Abdomen nondistended nontender Extremities warm well perfused Neurological exam Intubated, on IV fentanyl drip No spontaneous movements To loud voice and noxious stimulation, opens eyes but does not follow commands Pupils are equal round reactive to light Corneal reflexes are present Breathing over the ventilator No withdrawal to noxious stimulation in any of the 4 extremities although it does look like he has some perception of the noxious stimulation because he opens eyes wider when noxious stimulation is applied compared to just opening eyes with loud voice.   Labs I have reviewed labs in epic and the results pertinent to this consultation are:   CBC    Component Value Date/Time   WBC 18.4 (H) 01/31/2021 0328   RBC 4.28 01/31/2021 0328   HGB 13.3 01/31/2021 0328   HGB 14.1 10/03/2020 1144   HCT 40.7 01/31/2021 0328   HCT 40.7 10/03/2020 1144   PLT 218 01/31/2021 0328   PLT 289  10/03/2020 1144   MCV 95.1 01/31/2021 0328   MCV 86 10/03/2020 1144   MCH 31.1 01/31/2021 0328   MCHC 32.7 01/31/2021 0328   RDW 12.9 01/31/2021 0328   RDW 12.4 10/03/2020 1144   LYMPHSABS 4.4 (H) 02/11/2021 1640   LYMPHSABS 2.5 10/03/2020 1144   MONOABS 1.0 02-11-21 1640   EOSABS 0.1 2021/02/11 1640   EOSABS 0.1 10/03/2020 1144   BASOSABS 0.1 02-11-21 1640   BASOSABS 0.1 10/03/2020 1144    CMP     Component Value Date/Time   NA 137 01/31/2021 0328   NA 138 10/05/2020 1023   K 4.7 01/31/2021 0328   CL 103 01/31/2021 0328   CO2 23 01/31/2021 0328   GLUCOSE 236 (H) 01/31/2021 0328   BUN 49 (H) 01/31/2021 0328   BUN 32 (H) 10/05/2020 1023   CREATININE 2.98 (H) 01/31/2021 0328   CALCIUM 8.2 (L) 01/31/2021 0328   PROT 7.8 02/11/2021 1640   PROT 7.2 10/05/2020 1023   ALBUMIN 4.2 11-Feb-2021 1640   ALBUMIN 4.5 10/05/2020 1023   AST 31 02-11-21 1640   ALT 24 02/11/21 1640   ALKPHOS 82 02/11/21 1640   BILITOT 0.9 2021-02-11 1640   BILITOT 0.5 10/05/2020 1023   GFRNONAA 21 (L) 01/31/2021 0328   GFRAA 47 (L) 01/10/2020 1113    Lipid Panel     Component Value Date/Time   CHOL 153 10/03/2020 1144   TRIG 144 10/03/2020 1144   HDL 36 (L) 10/03/2020 1144   CHOLHDL 4.3 10/03/2020 1144   CHOLHDL 2.6 07/15/2019 0614   VLDL 14 07/15/2019 0614   LDLCALC 91 10/03/2020 1144     Imaging I have reviewed the images obtained:  CT-head on arrival was unremarkable.  Repeat CT head this morning shows bilateral cerebellar hypodensities as well as possible hypodensities in the right parieto-occipital area concerning for new strokes   Assessment: 75 past history of diabetes, CHF, coronary artery disease status post CABG, hyperlipidemia, CKD, BPH presented to the emergency room on 02/11/2021 with chest pain and noted to have inferior STEMI, as well as cardiogenic shock, taken to cardiac cath without emergent intervention amenable lesion, noted to have severe native coronary artery  disease similar to prior studies from April 21.  Presumably was following commands till midmorning yesterday but today on wake-up assessments has not been following commands. CT head was done due to neurological change which revealed bilateral cerebellar hypodensities as well as right parieto-occipital lobe hypodensity concerning for new stroke Given that he had cardiogenic  shock and a STEMI, he definitely could have had a stroke. Last known well is unclear and sees outside the window for both IV thrombolysis and endovascular thrombectomy. Further work-up would be determined on imaging findings.  Impression: Evaluate for new strokes in the setting of STEMI and cardiogenic shock  Recommendations: MRI brain w/o stat MRA head and neck without contrast stat-avoiding CTA due to history and renal function Medical management per primary team as well as cardiology and nephrology as you are I will follow-up on the imaging with you. Plan discussed with Dr. Lake Bells and the PCCM team   ADDENDUM Preliminary review of the MRI with multifocal scattered acute ischemic strokes in a cardioembolic as well as watershed pattern.  Preliminary review of the MRA head and neck with no significant LVO. Likely culprit of his strokes is the cardiogenic shock. He is currently on dual antiplatelets-aspirin and Plavix.  I would continue that. I would only use heparin if absolutely necessary and in that case also use heparin at low PTT/low heparin level-stroke protocol-no bolus. There is always risk of hemorrhagic transformation in the strokes which should be kept in mind. I discussed my plan in detail with Dr. Lake Bells. -- Amie Portland, MD Neurologist [facility] Pager: [phone]

## 2021-01-31 NOTE — Progress Notes (Signed)
PHARMACY - PHYSICIAN COMMUNICATION CRITICAL VALUE ALERT - BLOOD CULTURE IDENTIFICATION (BCID)  Benjamin Chan is an 75 y.o. male who presented to [facility] on 01/28/2021 with a chief complaint of Code STEMI  Assessment:  1/4 bottles GPC. BCID detected MRSE. Suspected contaminant from normal skin flora  Name of physician (or Provider) Contacted: Dr. Kendrick Fries  Current antibiotics: None  Changes to prescribed antibiotics recommended:  Starting ceftriaxone + azithromycin for CAP  Results for orders placed or performed during the hospital encounter of 01/30/2021  Blood Culture ID Panel (Reflexed) (Collected: 01/30/2021 11:43 AM)  Result Value Ref Range   Enterococcus faecalis NOT DETECTED NOT DETECTED   Enterococcus Faecium NOT DETECTED NOT DETECTED   Listeria monocytogenes NOT DETECTED NOT DETECTED   Staphylococcus species DETECTED (A) NOT DETECTED   Staphylococcus aureus (BCID) NOT DETECTED NOT DETECTED   Staphylococcus epidermidis DETECTED (A) NOT DETECTED   Staphylococcus lugdunensis NOT DETECTED NOT DETECTED   Streptococcus species NOT DETECTED NOT DETECTED   Streptococcus agalactiae NOT DETECTED NOT DETECTED   Streptococcus pneumoniae NOT DETECTED NOT DETECTED   Streptococcus pyogenes NOT DETECTED NOT DETECTED   A.calcoaceticus-baumannii NOT DETECTED NOT DETECTED   Bacteroides fragilis NOT DETECTED NOT DETECTED   Enterobacterales NOT DETECTED NOT DETECTED   Enterobacter cloacae complex NOT DETECTED NOT DETECTED   Escherichia coli NOT DETECTED NOT DETECTED   Klebsiella aerogenes NOT DETECTED NOT DETECTED   Klebsiella oxytoca NOT DETECTED NOT DETECTED   Klebsiella pneumoniae NOT DETECTED NOT DETECTED   Proteus species NOT DETECTED NOT DETECTED   Salmonella species NOT DETECTED NOT DETECTED   Serratia marcescens NOT DETECTED NOT DETECTED   Haemophilus influenzae NOT DETECTED NOT DETECTED   Neisseria meningitidis NOT DETECTED NOT DETECTED   Pseudomonas aeruginosa NOT DETECTED  NOT DETECTED   Stenotrophomonas maltophilia NOT DETECTED NOT DETECTED   Candida albicans NOT DETECTED NOT DETECTED   Candida auris NOT DETECTED NOT DETECTED   Candida glabrata NOT DETECTED NOT DETECTED   Candida krusei NOT DETECTED NOT DETECTED   Candida parapsilosis NOT DETECTED NOT DETECTED   Candida tropicalis NOT DETECTED NOT DETECTED   Cryptococcus neoformans/gattii NOT DETECTED NOT DETECTED   Methicillin resistance mecA/C DETECTED (A) NOT DETECTED    Benjamin Chan 01/31/2021  8:52 AM

## 2021-01-31 NOTE — Progress Notes (Signed)
ANTICOAGULATION CONSULT NOTE  Pharmacy Consult for heparin Indication: chest pain/ACS  Patient Measurements: Height: 5\' 6"  (167.6 cm) Weight: 90.2 kg (198 lb 13.7 oz) IBW/kg (Calculated) : 63.8 Heparin Dosing Weight: 82 kg  Labs: Recent Labs    01/23/2021 1640 01/30/21 0244 01/30/21 0412 01/30/21 0520 01/30/21 1040 01/30/21 1143 01/30/21 1338 01/30/21 2152 01/31/21 0328  HGB 14.7  --  13.7  --   --   --   --   --  13.3  HCT 44.2  --  41.6  --   --   --   --   --  40.7  PLT 284  --  270  --   --   --   --   --  218  APTT 27  --   --   --   --   --   --   --   --   LABPROT 13.7  --   --   --   --   --   --   --   --   INR 1.1  --   --   --   --   --   --   --   --   HEPARINUNFRC  --   --   --   --   --   --  <0.10* 0.19* 0.56  CREATININE 1.91*  --  2.64*  --   --   --   --   --  2.98*  TROPONINIHS 103*   < >  --    < > >24,000* >24,000* >24,000*  --   --    < > = values in this interval not displayed.     Estimated Creatinine Clearance: 21.8 mL/min (A) (by C-G formula based on SCr of 2.98 mg/dL (H)).   Medical History: Past Medical History:  Diagnosis Date   Arthritis    shoulder   BPH (benign prostatic hyperplasia)    CAD (coronary artery disease)    Chronic kidney disease    COPD (chronic obstructive pulmonary disease) (HCC)    Diabetes mellitus without complication (HCC)    Diabetic ketoacidosis without coma associated with type 2 diabetes mellitus (HCC)    Heart attack (HCC) 06/2019   Hypertension    MI (myocardial infarction) (HCC) 03/2017   Non-ST elevation myocardial infarction (NSTEMI), subendocardial infarction, subsequent episode of care [facility]) 04/14/2017   NSTEMI (non-ST elevated myocardial infarction) (HCC) 07/14/2019     Assessment: 75 year old male brought to the ED after being found unresponsive. Pharmacy consulted for heparin management for ACS. No anticoagulation noted PTA.  Goal of Therapy:  Heparin level 0.3-0.7 units/ml Monitor platelets by  anticoagulation protocol: Yes  11/16 2152 HL 0.19 11/17 0328 HL  0.56, therapeutic x 1   Plan: --Heparin bolus 2400 units x 1  --Increase heparin infusion to 1150 units/hr --Will re-check HL in 8 hours to confirm. --Daily CBC per protocol  12/17, PharmD, [facility] 01/31/2021 5:59 AM

## 2021-01-31 NOTE — Progress Notes (Signed)
Patient transported to CT from ICU and back to ICU room. No issues with transport.

## 2021-01-31 NOTE — Progress Notes (Signed)
NAME:  Benjamin Chan, MRN:  [id number], DOB:  21-Aug-1945, LOS: 2 ADMISSION DATE:  01/26/2021, CONSULTATION DATE: 01/27/2021 REFERRING MD: Vladimir Crofts, MD, CHIEF COMPLAINT: Chest pain, MI  History of Present Illness:  75 year old with history of hypertension, diabetes, CHF, coronary artery disease status post CABG, hyperlipidemia, BPH, CKD.  Presenting with chest pain, inferior ST elevation MI.  He was intubated in the emergency room.  Evaluated by cardiology and taken to the Cath Lab from the ED  Pertinent  Medical History   Past Medical History:  Diagnosis Date   Arthritis    shoulder   BPH (benign prostatic hyperplasia)    CAD (coronary artery disease)    Chronic kidney disease    COPD (chronic obstructive pulmonary disease) ([place])    Diabetes mellitus without complication ([place])    Diabetic ketoacidosis without coma associated with type 2 diabetes mellitus ([place])    Heart attack ([place]) 06/2019   Hypertension    MI (myocardial infarction) ([place]) 03/2017   Non-ST elevation myocardial infarction (NSTEMI), subendocardial infarction, subsequent episode of care [facility]) 04/14/2017   NSTEMI (non-ST elevated myocardial infarction) ([place]) 07/14/2019     Significant Hospital Events: Including procedures, antibiotic start and stop dates in addition to other pertinent events   11/15 admit, intubation.  Cardiac cath 11/16: Remains in shock, suspect cardiogenic.  Worsening Leukocytosis and Procalcitonin ~ perform infectious workup.  Hold Lasix due to worsening renal function 11/17: Weaning Vasopressors, FiO2 weaned to 40%, plan for SBT. Concern for Aspiration PNA (Tracheal aspirate with Gram + rods & cocci) ~ start Azithromycin & Ceftriaxone.  BC 1/4 bottles with MRSE (suspect contaminant). Nephrology consulted for worsening renal failure  MICRO Data:  02/12/2021: SARS-CoV-2 & Influenza PCR>>negative 02/05/2021: Blood culture>> 1/4 bottles with MRSE (suspect Contaminant) 01/18/2021: MRSA  PCR>>negative 01/30/2021: Tracheal aspirate>> gram + rods & cocci  Antimicrobials:  Azithromycin 11/17>> Ceftriaxone 11/17>>  Procedures:  02/08/2021: Cardiac Cath: Conclusions: Severe native coronary artery disease that is very similar to prior diagnostic catheterization from 06/2019.  This includes i95% proximal LAD stenosis, chronic total occlusion of the proximal LCx, and sequential 70% and 30% mid RCA stenoses.  Patent LIMA-LAD with 20% mid graft stenosis. Patent SVG-D2 with 30% proximal graft disease. Chronically occluded SVG-OM2. Severely elevated left ventricular filling pressure (LVEDP ~35 mmHg). Recommendations: No obvious culprit for patient acute angina and subtle inferior ST elevation.  I suspect his symptoms and EKG findings may be due to acute on chronic HFrEF complicated by worsening mitral regurgitation noted on bedside echocardiogram this evening. Wean norepinephrine as tolerated. Establish central venous access in ICU and obtain central venous O2 saturation.  Consider adding milrinone if SvO2 < 70%. Aggressive diuresis.  Interim History / Subjective:  -No significant events noted overnight -Low grade temperature overnight (T max 100.4), persistent leukocytosis and elevated Procalcitonin -Tracheal aspirate with Gram + rods & cocci ~ concern for aspiration pna ~ start Azithromycin & Ceftriaxone -1/4 blood culture with MRSE ~ suspect contaminant -Creatinine worsened to 2.98 (2.64), UOP 1.5L last 24 hrs (net + 800 cc since admit) -Will consult Nephrology  Objective   Blood pressure 109/61, pulse 92, temperature 100 F (37.8 C), temperature source Axillary, resp. rate 19, height $RemoveBe'5\' 6"'FWMLcxsFD$  (1.676 m), weight 90.2 kg, SpO2 97 %. CVP:  [12 mmHg-15 mmHg] 12 mmHg  Vent Mode: PSV FiO2 (%):  [40 %-70 %] 40 % Set Rate:  [30 bmp] 30 bmp Vt Set:  [450 mL] 450 mL PEEP:  [5 cmH20] 5 cmH20 Pressure  Support:  [8 cmH20] 8 cmH20 Plateau Pressure:  [15 cmH20] 15 cmH20   Intake/Output  Summary (Last 24 hours) at 01/31/2021 1004 Last data filed at 01/31/2021 0739 Gross per 24 hour  Intake 2538.04 ml  Output 1775 ml  Net 763.04 ml    Filed Weights   01/28/2021 1619 02/11/2021 1743 01/31/21 0317  Weight: 88.3 kg 88.3 kg 90.2 kg    Examination: Gen:      Acutely ill appearing, intubated and lightly sedated, in NAD HEENT:  Atraumatic, normocephalic,EOMI, sclera anicteric Neck:     No masses; no thyromegaly, ETT Lungs:    Clear to auscultation bilaterally; synchronous with the vent, event, even CV:         Regular rate and rhythm; s1s2, no murmurs Abd:      + bowel sounds; soft, non-tender; no palpable masses, no distension Ext:    No edema; adequate peripheral perfusion Skin:      Warm and dry; no rash Neuro: Lightly sedated, arouses to voice, intermittently follows commands, pupils PERRL   Resolved Hospital Problem list     Assessment & Plan:   Inferior ST elevation MI Acute on Chronic HFrEF (LVEF 25-30%) Shock: Suspect Cardiogenic Hs: ischemic cardiomyopathy, coronary artery disease Cardiac cath notes reviewed with patent grafts and no obvious culprit lesion He does have elevated LVEDP indicating decompensated heart failure -Continuous cardiac monitoring -Maintain MAP >65 -Vasopressors as needed to maintain MAP goal -Trend lactic acid until normalized (3.7 ~ 2.2 ~ 1.8) -Follow Coox panel ~ If SvO2 <70%, then start Milrinone -Trend HS Troponin until peaked (103 ~ 14,142 ~ 21,778 ~) -Echocardiogram 11/16: LVEF 25-30%, Grade II diastolic dysfunction, Low normal RV systolic function -Cardiology following, appreciate input -Continue Heparin gtt -Diuresis as BP and renal function permits ~ will hold furhter diuresis today 11/17 due to worsening renal function  Acute encephalopathy Sedation needs in setting of mechanical ventilation -Maintain a RASS goal of 0 to -1 -Fentanyl gtt as needed to maintain RASS goal -Avoid sedating medications as able -Daily wake up  assessment -CT with no acute abnormalities  Acute hypoxic respiratory failure in setting of Acute on Chronic HFrEF with Pulmonary Edema Hx: COPD -Full vent support, implement lung protective strategies -Plateau pressures less than 30 cm H20 -Wean FiO2 & PEEP as tolerated to maintain O2 sats >92% -Follow intermittent Chest X-ray & ABG as needed -Spontaneous Breathing Trials when respiratory parameters met and mental status permits -Implement VAP Bundle -Bronchodilators -Diuresis as BP and renal function permits  Leukocytosis Concern for Aspiration Pneumonia (Tracheal aspirate with Gram + cocci & rods) No obvious source of infection : UA is negative, CXR not concerning for Pneumonia, no signs of skin infection, abdominal exam is benign -Monitor fever curve -Trend WBC's & Procalcitonin -Follow cultures as above -Start empiric Azithromycin & Ceftriaxone pending cultures & sensitivities  AKI on Chronic kidney disease Mild Hyperkalemia ~ resolved -Monitor I&O's / urinary output -Follow BMP -Ensure adequate renal perfusion -Avoid nephrotoxic agents as able -Replace electrolytes as indicated -Nephrology consult, appreciate input  Diabetes Mellitus -CBG's q4h; Target range of 140 to 180 -SSI -Follow ICU Hypo/Hyperglycemia protocol     Best Practice (right click and "Reselect all SmartList Selections" daily)   Diet/type: NPO, tube feeds via OGT DVT prophylaxis: systemic heparin GI prophylaxis: PPI Lines: Central line and is still needed Foley:  Yes, and it is still needed Code Status:  full code Last date of multidisciplinary goals of care discussion [01/31/2021]   Labs  CBC: Recent Labs  Lab 02/13/2021 1640 01/30/21 0412 01/31/21 0328  WBC 13.9* 18.4* 18.4*  NEUTROABS 8.2*  --   --   HGB 14.7 13.7 13.3  HCT 44.2 41.6 40.7  MCV 91.9 92.4 95.1  PLT 284 270 218     Basic Metabolic Panel: Recent Labs  Lab 01/30/2021 1640 01/30/21 0412 01/31/21 0328  NA 136 134*  137  K 3.3* 5.2* 4.7  CL 104 103 103  CO2 $Re'22 23 23  'gTB$ GLUCOSE 192* 302* 236*  BUN 32* 38* 49*  CREATININE 1.91* 2.64* 2.98*  CALCIUM 8.6* 8.0* 8.2*  MG 2.5* 2.4  --   PHOS  --  7.2*  --     GFR: Estimated Creatinine Clearance: 21.8 mL/min (A) (by C-G formula based on SCr of 2.98 mg/dL (H)). Recent Labs  Lab 01/15/2021 1640 02/05/2021 2233 01/30/21 0412 01/31/21 0328  PROCALCITON  --  0.65 1.44 1.85  WBC 13.9*  --  18.4* 18.4*  LATICACIDVEN 3.7* 2.2* 1.8  --      Liver Function Tests: Recent Labs  Lab 02/12/2021 1640  AST 31  ALT 24  ALKPHOS 82  BILITOT 0.9  PROT 7.8  ALBUMIN 4.2    No results for input(s): LIPASE, AMYLASE in the last 168 hours. No results for input(s): AMMONIA in the last 168 hours.  ABG    Component Value Date/Time   PHART 7.31 (L) 01/30/2021 0436   PCO2ART 46 01/30/2021 0436   PO2ART 133 (H) 01/30/2021 0436   HCO3 23.2 01/30/2021 0436   ACIDBASEDEF 3.3 (H) 01/30/2021 0436   O2SAT 98.8 01/30/2021 0436      Coagulation Profile: Recent Labs  Lab 01/23/2021 1640  INR 1.1     Cardiac Enzymes: No results for input(s): CKTOTAL, CKMB, CKMBINDEX, TROPONINI in the last 168 hours.  HbA1C: HB A1C (BAYER DCA - WAIVED)  Date/Time Value Ref Range Status  01/05/2020 03:28 PM 6.6 <7.0 % Final    Comment:                                          Diabetic Adult            <7.0                                       Healthy Adult        4.3 - 5.7                                                           (DCCT/NGSP) American Diabetes Association's Summary of Glycemic Recommendations for Adults with Diabetes: Hemoglobin A1c <7.0%. More stringent glycemic goals (A1c <6.0%) may further reduce complications at the cost of increased risk of hypoglycemia.   04/07/2018 10:24 AM 11.4 (H) <7.0 % Final    Comment:                                          Diabetic Adult            <  7.0                                       Healthy Adult        4.3 - 5.7                                                            (DCCT/NGSP) American Diabetes Association's Summary of Glycemic Recommendations for Adults with Diabetes: Hemoglobin A1c <7.0%. More stringent glycemic goals (A1c <6.0%) may further reduce complications at the cost of increased risk of hypoglycemia.    Hgb A1c MFr Bld  Date/Time Value Ref Range Status  01/18/2021 10:33 PM 6.5 (H) 4.8 - 5.6 % Final    Comment:    (NOTE) Pre diabetes:          5.7%-6.4%  Diabetes:              >6.4%  Glycemic control for   <7.0% adults with diabetes   10/03/2020 11:44 AM 7.8 (H) 4.8 - 5.6 % Final    Comment:             Prediabetes: 5.7 - 6.4          Diabetes: >6.4          Glycemic control for adults with diabetes: <7.0     CBG: Recent Labs  Lab 01/30/21 1533 01/30/21 1914 01/30/21 2313 01/31/21 0320 01/31/21 0716  GLUCAP 136* 138* 217* 208* 251*     Review of Systems:   Unable to obtain as patient is intubated  Past Medical History:  He,  has a past medical history of Arthritis, BPH (benign prostatic hyperplasia), CAD (coronary artery disease), Chronic kidney disease, COPD (chronic obstructive pulmonary disease) ([place]), Diabetes mellitus without complication ([place]), Diabetic ketoacidosis without coma associated with type 2 diabetes mellitus ([place]), Heart attack ([place]) (06/2019), Hypertension, MI (myocardial infarction) ([place]) (03/2017), Non-ST elevation myocardial infarction (NSTEMI), subendocardial infarction, subsequent episode of care [facility]) (04/14/2017), and NSTEMI (non-ST elevated myocardial infarction) ([place]) (07/14/2019).   Surgical History:   Past Surgical History:  Procedure Laterality Date   COLONOSCOPY WITH PROPOFOL N/A 05/02/2019   Procedure: COLONOSCOPY WITH PROPOFOL;  Surgeon: Lucilla Lame, MD;  Location: [facility];  Service: Endoscopy;  Laterality: N/A;  Diabetic - insulin and oral meds   CORONARY ARTERY BYPASS GRAFT  2006   2 vessel   CORONARY STENT INTERVENTION  N/A 03/30/2017   Procedure: CORONARY STENT INTERVENTION;  Surgeon: Yolonda Kida, MD;  Location: [place] [facility];  Service: Cardiovascular;  Laterality: N/A;   INGUINAL HERNIA REPAIR     right   LEFT HEART CATH AND CORONARY ANGIOGRAPHY N/A 07/14/2019   Procedure: LEFT HEART CATH AND CORONARY ANGIOGRAPHY;  Surgeon: Teodoro Spray, MD;  Location: [place] [facility];  Service: Cardiovascular;  Laterality: N/A;   LEFT HEART CATH AND CORS/GRAFTS ANGIOGRAPHY N/A 03/30/2017   Procedure: LEFT HEART CATH AND CORS/GRAFTS ANGIOGRAPHY;  Surgeon: Corey Skains, MD;  Location: [place] [facility];  Service: Cardiovascular;  Laterality: N/A;   LEFT HEART CATH AND CORS/GRAFTS ANGIOGRAPHY N/A 01/20/2021   Procedure: LEFT HEART CATH AND CORS/GRAFTS ANGIOGRAPHY;  Surgeon: Nelva Bush, MD;  Location: [place] [facility];  Service: Cardiovascular;  Laterality: N/A;     Social History:   reports that he quit smoking about 12 years ago. His smoking use included cigarettes. He has never used smokeless tobacco. He reports that he does not drink alcohol and does not use drugs.   Family History:  His family history includes Hypertension in his brother, brother, brother, brother, brother, father, mother, sister, sister, sister, sister, sister, and sister.   Allergies Allergies  Allergen Reactions   Brilinta [Ticagrelor] Shortness Of Breath    D/c by cardiology     Home Medications  Prior to Admission medications   Medication Sig Start Date End Date Taking? Authorizing Provider  acetaminophen (TYLENOL) 325 MG tablet Take 650 mg by mouth every 6 (six) hours as needed.    [provider]  albuterol (VENTOLIN HFA) 108 (90 Base) MCG/ACT inhaler Inhale 2 puffs into the lungs every 6 (six) hours as needed for wheezing or shortness of breath. 12/11/20   Jon Billings, NP  ASPIRIN LOW DOSE 81 MG EC tablet TAKE ONE TABLET BY MOUTH ONCE DAILY 11/14/20   Jon Billings, NP  atorvastatin  (LIPITOR) 80 MG tablet TAKE ONE TABLET BY MOUTH AT BEDTIME 10/10/20   Jon Billings, NP  clopidogrel (PLAVIX) 75 MG tablet TAKE ONE TABLET BY MOUTH EVERY MORNING 09/18/20   Jon Billings, NP  Dulaglutide (TRULICITY) 2.35 TD/3.2KG SOPN Inject 0.75 mg into the skin once a week. Taking on Wednesdays    [provider]  ezetimibe (ZETIA) 10 MG tablet TAKE ONE TABLET BY MOUTH ONCE DAILY 11/14/20   Jon Billings, NP  finasteride (PROSCAR) 5 MG tablet Take 1 tablet (5 mg total) by mouth daily. 12/11/20   Jon Billings, NP  fluticasone (FLONASE) 50 MCG/ACT nasal spray Place 1 spray into both nostrils 2 (two) times daily. Pt takes twice a day 12/11/20   Jon Billings, NP  Fluticasone-Umeclidin-Vilant (TRELEGY ELLIPTA) 100-62.5-25 MCG/INH AEPB Inhale 1 puff into the lungs daily. 08/03/20   Jon Billings, NP  furosemide (LASIX) 40 MG tablet Take 60 mg by mouth daily. 12/28/19   [provider]  glipiZIDE (GLUCOTROL) 5 MG tablet TAKE ONE TABLET BY MOUTH EVERY MORNING 10/10/20   Jon Billings, NP  Insulin Glargine (BASAGLAR KWIKPEN) 100 UNIT/ML Inject 40 Units into the skin in the morning.    [provider]  Insulin Pen Needle (PEN NEEDLES 5/16") 30G X 8 MM MISC 1 each by Does not apply route daily. 02/21/20   Johnson, Megan P, DO  JARDIANCE 25 MG TABS tablet TAKE ONE TABLET BY MOUTH ONCE DAILY 08/16/20   Wynetta Emery, Megan P, DO  losartan (COZAAR) 100 MG tablet Take 1 tablet (100 mg total) by mouth daily. 07/26/20 01/30/21  Jon Billings, NP  metFORMIN (GLUCOPHAGE-XR) 500 MG 24 hr tablet Take 2 tablets (1,000 mg total) by mouth 2 (two) times daily. 07/26/20   Jon Billings, NP  metoprolol succinate (TOPROL-XL) 25 MG 24 hr tablet  12/29/19   [provider]  metoprolol succinate (TOPROL-XL) 50 MG 24 hr tablet Take by mouth. 08/20/20 08/20/21  [provider]  Multiple Vitamin (MULTIVITAMIN) TABS TAKE ONE TABLET BY MOUTH ONCE DAILY 07/23/20   Jon Billings, NP  nitroGLYCERIN (NITROSTAT) 0.4 MG SL tablet Place 1 tablet (0.4 mg total) under the tongue every 5 (five) minutes as needed for chest pain. 07/26/20   Jon Billings, NP  OneTouch Delica Lancets 25K MISC 1 each by Does not apply route daily. 01/06/20   Valerie Roys, DO  spironolactone (ALDACTONE) 25 MG tablet Take 1 tablet (25 mg total) by mouth daily. 07/26/20 01/30/21  Jon Billings, NP     Critical care time: 38 minutes   The patient is critically ill with multiple organ system failure and requires high complexity decision making for assessment and support, frequent evaluation and titration of therapies, advanced monitoring, review of radiographic studies and interpretation of complex data.    Darel Hong, AGACNP-BC [facility] Prefer epic messenger for cross cover needs If after hours, please call E-link

## 2021-01-31 NOTE — Progress Notes (Signed)
PT Cancellation Note  Patient Details Name: KLAYTEN JOLLIFF MRN: [id number] DOB: [birth year]   Cancelled Treatment:    Reason Eval/Treat Not Completed: Patient not medically ready. Per RN, hold for now, patient still intubated and not ready for PT. Will re-attempt tomorrow if appropriate.    Jhoanna Heyde 01/31/2021, 10:51 AM

## 2021-01-31 NOTE — Progress Notes (Signed)
[facility] Cardiology    SUBJECTIVE: Intubated sedated on vent   Vitals:   01/31/21 0848 01/31/21 0900 01/31/21 1000 01/31/21 1100  BP:  109/61 109/64 104/63  Pulse:  92 96 (!) 107  Resp:  19 (!) 24 (!) 26  Temp:    99.2 F (37.3 C)  TempSrc:    Axillary  SpO2: 97% 97% 97% 97%  Weight:      Height:         Intake/Output Summary (Last 24 hours) at 01/31/2021 1317 Last data filed at 01/31/2021 1106 Gross per 24 hour  Intake 2905.82 ml  Output 2125 ml  Net 780.82 ml      PHYSICAL EXAM  General: Well developed, well nourished, in no acute distress HEENT:  Normocephalic and atramatic Neck:  No JVD.  Lungs: Clear bilaterally to auscultation and percussion. Heart: HRRR . Normal S1 and S2 without gallops or murmurs.  Abdomen: Bowel sounds are positive, abdomen soft and non-tender  Msk:  Back normal, normal gait. Normal strength and tone for age. Extremities: No clubbing, cyanosis or edema.   Neuro: Alert and oriented X 3. Psych:  Good affect, responds appropriately   LABS: Basic Metabolic Panel: Recent Labs    01/30/21 0412 01/31/21 0328 01/31/21 1127  NA 134* 137  --   K 5.2* 4.7  --   CL 103 103  --   CO2 23 23  --   GLUCOSE 302* 236*  --   BUN 38* 49*  --   CREATININE 2.64* 2.98*  --   CALCIUM 8.0* 8.2*  --   MG 2.4  --  2.4  PHOS 7.2*  --  4.0   Liver Function Tests: Recent Labs    02/03/2021 1640  AST 31  ALT 24  ALKPHOS 82  BILITOT 0.9  PROT 7.8  ALBUMIN 4.2   No results for input(s): LIPASE, AMYLASE in the last 72 hours. CBC: Recent Labs    01/19/2021 1640 01/30/21 0412 01/31/21 0328  WBC 13.9* 18.4* 18.4*  NEUTROABS 8.2*  --   --   HGB 14.7 13.7 13.3  HCT 44.2 41.6 40.7  MCV 91.9 92.4 95.1  PLT 284 270 218   Cardiac Enzymes: No results for input(s): CKTOTAL, CKMB, CKMBINDEX, TROPONINI in the last 72 hours. BNP: Invalid input(s): POCBNP D-Dimer: Recent Labs    01/30/21 1040  DDIMER 6.79*   Hemoglobin A1C: Recent Labs     02/04/2021 2233  HGBA1C 6.5*   Fasting Lipid Panel: No results for input(s): CHOL, HDL, LDLCALC, TRIG, CHOLHDL, LDLDIRECT in the last 72 hours. Thyroid Function Tests: No results for input(s): TSH, T4TOTAL, T3FREE, THYROIDAB in the last 72 hours.  Invalid input(s): FREET3 Anemia Panel: No results for input(s): VITAMINB12, FOLATE, FERRITIN, TIBC, IRON, RETICCTPCT in the last 72 hours.  CT HEAD WO CONTRAST ( )  Result Date: 01/31/2021 CLINICAL DATA:  Mental status change, unknown cause EXAM: CT HEAD WITHOUT CONTRAST TECHNIQUE: Contiguous axial images were obtained from the base of the skull through the vertex without intravenous contrast. COMPARISON:  January 29, 2021. FINDINGS: Brain: New hypodensities in bilateral cerebellar hemispheres and right parieto-occipital cortex. No evidence of acute hemorrhage, mass lesion, midline shift, hydrocephalus, or extra-axial fluid collection. Moderate patchy white matter hypoattenuation, nonspecific but compatible with chronic microvascular ischemic disease. Vascular: No hyperdense vessel identified. Skull: No acute fracture. Sinuses/Orbits: Visualized sinuses are clear.  Unremarkable orbits. Other: No mastoid effusions. IMPRESSION: 1. New hypodensities in bilateral cerebellar hemispheres and right parieto-occipital cortex, concerning for acute  infarcts. Recommend MRI to further evaluate. 2. Moderate chronic microvascular disease. Electronically Signed   By: Margaretha Sheffield M.D.   On: 01/31/2021 12:12   CT HEAD WO CONTRAST (5MM)  Result Date: 01/23/2021 CLINICAL DATA:  Found unresponsive. EXAM: CT HEAD WITHOUT CONTRAST TECHNIQUE: Contiguous axial images were obtained from the base of the skull through the vertex without intravenous contrast. COMPARISON:  None. FINDINGS: Brain: No evidence of acute infarction, hemorrhage, hydrocephalus, extra-axial collection or mass lesion/mass effect. Scattered areas of low-density in the white matter is suggestive for  chronic small vessel ischemic changes. Vascular: No hyperdense vessel or unexpected calcification. Skull: Normal. Negative for fracture or focal lesion. Sinuses/Orbits: Mucosal thickening in the ethmoid air cells. Mastoid air cells are aerated. There is probably fluid and soft tissue fullness in the posterior nasal cavity. Other: None. IMPRESSION: 1. No acute intracranial abnormality. 2. Evidence for chronic small vessel ischemic changes. Electronically Signed   By: Markus Daft M.D.   On: 01/24/2021 17:12   CARDIAC CATHETERIZATION  Result Date: 01/20/2021 Conclusions: Severe native coronary artery disease that is very similar to prior diagnostic catheterization from 06/2019.  This includes i95% proximal LAD stenosis, chronic total occlusion of the proximal LCx, and sequential 70% and 30% mid RCA stenoses. Patent LIMA-LAD with 20% mid graft stenosis. Patent SVG-D2 with 30% proximal graft disease. Chronically occluded SVG-OM2. Severely elevated left ventricular filling pressure (LVEDP ~35 mmHg). Recommendations: No obvious culprit for patient acute angina and subtle inferior ST elevation.  I suspect his symptoms and EKG findings may be due to acute on chronic HFrEF complicated by worsening mitral regurgitation noted on bedside echocardiogram this evening. Wean norepinephrine as tolerated. Establish central venous access in ICU and obtain central venous O2 saturation.  Consider adding milrinone if SvO2 < 70%. Aggressive diuresis. Nelva Bush, MD [facility]  US Venous Img Lower Bilateral (DVT)  Result Date: 01/30/2021 CLINICAL DATA:  Bilateral lower extremity edema.  Evaluate for DVT. EXAM: BILATERAL LOWER EXTREMITY VENOUS DOPPLER ULTRASOUND TECHNIQUE: Gray-scale sonography with graded compression, as well as color Doppler and duplex ultrasound were performed to evaluate the lower extremity deep venous systems from the level of the common femoral vein and including the common femoral, femoral, profunda  femoral, popliteal and calf veins including the posterior tibial, peroneal and gastrocnemius veins when visible. The superficial great saphenous vein was also interrogated. Spectral Doppler was utilized to evaluate flow at rest and with distal augmentation maneuvers in the common femoral, femoral and popliteal veins. COMPARISON:  None. FINDINGS: RIGHT LOWER EXTREMITY Common Femoral Vein: No evidence of thrombus. Normal compressibility, respiratory phasicity and response to augmentation. Saphenofemoral Junction: No evidence of thrombus. Normal compressibility and flow on color Doppler imaging. Profunda Femoral Vein: No evidence of thrombus. Normal compressibility and flow on color Doppler imaging. Femoral Vein: No evidence of thrombus. Normal compressibility, respiratory phasicity and response to augmentation. Popliteal Vein: No evidence of thrombus. Normal compressibility, respiratory phasicity and response to augmentation. Calf Veins: No evidence of thrombus. Normal compressibility and flow on color Doppler imaging. Superficial Great Saphenous Vein: No evidence of thrombus. Normal compressibility. Venous Reflux:  None. Other Findings:  None. LEFT LOWER EXTREMITY Common Femoral Vein: No evidence of thrombus. Normal compressibility, respiratory phasicity and response to augmentation. Saphenofemoral Junction: No evidence of thrombus. Normal compressibility and flow on color Doppler imaging. Profunda Femoral Vein: No evidence of thrombus. Normal compressibility and flow on color Doppler imaging. Femoral Vein: No evidence of thrombus. Normal compressibility, respiratory phasicity and response to augmentation. Popliteal  Vein: No evidence of thrombus. Normal compressibility, respiratory phasicity and response to augmentation. Calf Veins: No evidence of thrombus. Normal compressibility and flow on color Doppler imaging. Superficial Great Saphenous Vein: No evidence of thrombus. Normal compressibility. Venous Reflux:  None.  Other Findings:  None. IMPRESSION: No evidence of DVT within either lower extremity. Electronically Signed   By: Sandi Mariscal M.D.   On: 01/30/2021 14:26   DG Chest Port 1 View  Result Date: 01/31/2021 CLINICAL DATA:  Acute respiratory failure with hypoxia EXAM: PORTABLE CHEST 1 VIEW COMPARISON:  Chest radiograph dated January 30, 2021 FINDINGS: Stable mild cardiomegaly. Diffuse mild interstitial opacities concerning for pulmonary edema, unchanged. No large pleural effusion. Endotracheal tube, feeding tube and right IJ access central line are unchanged. IMPRESSION: 1.  Stable cardiomegaly with mild pulmonary vascular congestion. 2.  Lines and tubes are unchanged. Electronically Signed   By: Keane Police D.O.   On: 01/31/2021 08:40   DG Chest Port 1 View  Result Date: 01/30/2021 CLINICAL DATA:  Acute respiratory failure EXAM: PORTABLE CHEST 1 VIEW COMPARISON:  Chest radiograph 02/04/2021 FINDINGS: The endotracheal tube is in stable position. A right IJ central venous catheter is in stable position. Median sternotomy wires are again noted. The heart is enlarged, unchanged. Prominent interstitial markings are again seen which suggest mild pulmonary interstitial edema. There is no new focal airspace disease. The right costophrenic angle is partially excluded, there is no large right effusion. There is no significant left effusion. There is no pneumothorax. IMPRESSION: Overall no significant interval change in lung aeration with stable mild pulmonary interstitial edema. Electronically Signed   By: Valetta Mole M.D.   On: 01/30/2021 08:00   DG Chest Port 1 View  Result Date: 01/31/2021 CLINICAL DATA:  Central line placement EXAM: PORTABLE CHEST 1 VIEW COMPARISON:  02/02/2021 FINDINGS: Endotracheal tube is 3 cm above the carina. Right central line tip is at the cavoatrial junction. No pneumothorax. NG tube is in the stomach. Cardiomegaly. Bibasilar atelectasis. Interstitial prominence again noted may reflect  interstitial edema. IMPRESSION: Right central line placement with the tip at the cavoatrial junction. No pneumothorax. Otherwise no change. Electronically Signed   By: Rolm Baptise M.D.   On: 02/06/2021 22:45   DG Chest Port 1 View  Result Date: 02/03/2021 CLINICAL DATA:  OG tube placed none. EXAM: PORTABLE CHEST 1 VIEW COMPARISON:  Radiograph earlier today FINDINGS: Tip and side port of the enteric tube are below the diaphragm in the stomach. Endotracheal tube tip just below the clavicular heads. Post median sternotomy. Stable cardiomegaly. Similar interstitial thickening suspicious for pulmonary edema, equivocal worsening on the right. No convincing pleural effusion. No pneumothorax. IMPRESSION: 1. Tip and side port of the enteric tube below the diaphragm in the stomach. Endotracheal tube tip just below the clavicular heads. 2. Stable cardiomegaly. Similar interstitial thickening suspicious for pulmonary edema, equivocal worsening on the right. Electronically Signed   By: Keith Rake M.D.   On: 02/09/2021 21:31   DG Chest Portable 1 View  Result Date: 02/07/2021 CLINICAL DATA:  Intubation, chest pain EXAM: PORTABLE CHEST 1 VIEW COMPARISON:  Chest radiograph 01/05/2020 FINDINGS: The endotracheal tube tip is approximately 3.5 cm from the carina. Median sternotomy wires and mediastinal surgical clips are again seen. The heart is enlarged, unchanged. The upper mediastinal contours are within normal limits. There are increased interstitial opacities throughout both lungs. There is no focal consolidation. There is no pleural effusion or pneumothorax. There is no acute osseous abnormality. IMPRESSION: 1.  Increased interstitial opacities throughout both lungs favored to reflect pulmonary interstitial edema with differential including viral/atypical infection. 2. Unchanged cardiomegaly. Electronically Signed   By: Valetta Mole M.D.   On: 01/23/2021 17:04   ECHOCARDIOGRAM COMPLETE  Result Date:  01/30/2021    ECHOCARDIOGRAM REPORT   Patient Name:   Benjamin Chan Date of Exam: 01/30/2021 Medical Rec #:  [id number]          Height:       66.0 in Accession #:    [id number]         Weight:       194.7 lb Date of Birth:  [birth year]          BSA:          1.977 m Patient Age:    75 years           BP:           100/65 mmHg Patient Gender: M                  HR:           82 bpm. Exam Location:  [facility] Procedure: 2D Echo, Color Doppler, Cardiac Doppler and Intracardiac            Opacification Agent Indications:     I24.9 Acute ischemic heart disease, unspecified  History:         Patient has prior history of Echocardiogram examinations, most                  recent 07/15/2019. CAD and Previous Myocardial Infarction, CKD                  and COPD; Risk Factors:Diabetes.  Sonographer:     Charmayne Sheer Referring Phys:  [id number] CHRISTOPHER END Diagnosing Phys: Kate Sable MD  Sonographer Comments: Echo performed with patient supine and on artificial respirator, suboptimal parasternal window and suboptimal subcostal window. IMPRESSIONS  1. Left ventricular ejection fraction, by estimation, is 25 to 30%. Left ventricular ejection fraction by 2D MOD biplane is 26.0 %. The left ventricle has severely decreased function. The left ventricle demonstrates global hypokinesis. Left ventricular diastolic parameters are consistent with Grade II diastolic dysfunction (pseudonormalization).  2. Right ventricular systolic function is low normal. The right ventricular size is not well visualized.  3. Left atrial size was mildly dilated.  4. The mitral valve is grossly normal. Mild to moderate mitral valve regurgitation.  5. Tricuspid valve regurgitation is mild to moderate.  6. The aortic valve was not well visualized. Aortic valve regurgitation is not visualized. FINDINGS  Left Ventricle: Left ventricular ejection fraction, by estimation, is 25 to 30%. Left ventricular ejection fraction by 2D MOD biplane is 26.0 %. The left  ventricle has severely decreased function. The left ventricle demonstrates global hypokinesis. Definity contrast agent was given IV to delineate the left ventricular endocardial borders. The left ventricular internal cavity size was normal in size. There is no left ventricular hypertrophy. Left ventricular diastolic parameters are consistent with Grade II diastolic dysfunction (pseudonormalization). Right Ventricle: The right ventricular size is not well visualized. No increase in right ventricular wall thickness. Right ventricular systolic function is low normal. Left Atrium: Left atrial size was mildly dilated. Right Atrium: Right atrial size was normal in size. Pericardium: There is no evidence of pericardial effusion. Mitral Valve: The mitral valve is grossly normal. Mild to moderate mitral valve regurgitation. MV peak gradient, 4.4 mmHg. The mean mitral valve gradient  is 2.0 mmHg. Tricuspid Valve: The tricuspid valve is grossly normal. Tricuspid valve regurgitation is mild to moderate. Aortic Valve: The aortic valve was not well visualized. Aortic valve regurgitation is not visualized. Aortic valve mean gradient measures 9.0 mmHg. Aortic valve peak gradient measures 15.7 mmHg. Aortic valve area, by VTI measures 1.90 cm. Pulmonic Valve: The pulmonic valve was not well visualized. Pulmonic valve regurgitation is not visualized. Aorta: The aortic root is normal in size and structure. Venous: IVC assessment for right atrial pressure unable to be performed due to mechanical ventilation. IAS/Shunts: No atrial level shunt detected by color flow Doppler.  LEFT VENTRICLE PLAX 2D                        Biplane EF (MOD) LVOT diam:     2.30 cm         LV Biplane EF:   Left LV SV:         49                               ventricular LV SV Index:   25                               ejection LVOT Area:     4.15 cm                         fraction by                                                 2D MOD                                                  biplane is LV Volumes (MOD)                                26.0 %. LV vol d, MOD    178.0 ml A2C:                           Diastology LV vol d, MOD    204.0 ml      LV e' medial:    4.90 cm/s A4C:                           LV E/e' medial:  21.8 LV vol s, MOD    135.0 ml      LV e' lateral:   5.87 cm/s A2C:                           LV E/e' lateral: 18.2 LV vol s, MOD    155.0 ml A4C: LV SV MOD A2C:   43.0 ml LV SV MOD A4C:   204.0 ml LV SV MOD BP:    50.6 ml RIGHT VENTRICLE RV Basal diam:  3.10 cm RV S prime:  9.57 cm/s LEFT ATRIUM             Index        RIGHT ATRIUM           Index LA Vol (A2C):   86.4 ml 43.70 ml/m  RA Area:     20.00 cm LA Vol (A4C):   61.5 ml 31.11 ml/m  RA Volume:   55.00 ml  27.82 ml/m LA Biplane Vol: 73.1 ml 36.98 ml/m  AORTIC VALVE AV Area (Vmax):    1.92 cm AV Area (Vmean):   1.75 cm AV Area (VTI):     1.90 cm AV Vmax:           198.00 cm/s AV Vmean:          136.000 cm/s AV VTI:            0.258 m AV Peak Grad:      15.7 mmHg AV Mean Grad:      9.0 mmHg LVOT Vmax:         91.40 cm/s LVOT Vmean:        57.300 cm/s LVOT VTI:          0.118 m LVOT/AV VTI ratio: 0.46  AORTA Ao Root diam: 3.20 cm MITRAL VALVE MV Area (PHT): 4.26 cm     SHUNTS MV Area VTI:   1.79 cm     Systemic VTI:  0.12 m MV Peak grad:  4.4 mmHg     Systemic Diam: 2.30 cm MV Mean grad:  2.0 mmHg MV Vmax:       1.05 m/s MV Vmean:      58.5 cm/s MV Decel Time: 178 msec MV E velocity: 107.00 cm/s MV A velocity: 75.40 cm/s MV E/A ratio:  1.42 Debbe Odea MD Electronically signed by Debbe Odea MD Signature Date/Time: 01/30/2021/2:11:13 PM    Final      Echo ejection fraction 25 to 30% with diastolic dysfunction  TELEMETRY: Sinus rhythm rate of 70:  ASSESSMENT AND PLAN:  Principal Problem:   STEMI (ST elevation myocardial infarction) (HCC) Active Problems:   Cardiogenic shock (HCC)   ST elevation myocardial infarction (STEMI), unspecified artery (HCC) Respiratory  failure 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

## 2021-01-31 NOTE — Progress Notes (Signed)
Attending:    Subjective: Fever overnight GPC in sputum Pressors down Vent support down, now on SBT UOP and lytes OK AKI worse   Objective: Vitals:   01/31/21 0746 01/31/21 0800 01/31/21 0848 01/31/21 0900  BP:  116/64  109/61  Pulse:  93  92  Resp:  (!) 30  19  Temp:      TempSrc:      SpO2: 96% 96% 97% 97%  Weight:      Height:       Vent Mode: PSV FiO2 (%):  [40 %-70 %] 40 % Set Rate:  [30 bmp] 30 bmp Vt Set:  [450 mL] 450 mL PEEP:  [5 cmH20] 5 cmH20 Pressure Support:  [8 cmH20] 8 cmH20 Plateau Pressure:  [15 cmH20] 15 cmH20  Intake/Output Summary (Last 24 hours) at 01/31/2021 0952 Last data filed at 01/31/2021 0739 Gross per 24 hour  Intake 2538.04 ml  Output 1775 ml  Net 763.04 ml    General:  In bed on vent HENT: NCAT ETT in place PULM: CTA B, vent supported breathing CV: RRR, no mgr GI: BS+, soft, nontender MSK: normal bulk and tone Neuro: sedated on vent   CBC    Component Value Date/Time   WBC 18.4 (H) 01/31/2021 0328   RBC 4.28 01/31/2021 0328   HGB 13.3 01/31/2021 0328   HGB 14.1 10/03/2020 1144   HCT 40.7 01/31/2021 0328   HCT 40.7 10/03/2020 1144   PLT 218 01/31/2021 0328   PLT 289 10/03/2020 1144   MCV 95.1 01/31/2021 0328   MCV 86 10/03/2020 1144   MCH 31.1 01/31/2021 0328   MCHC 32.7 01/31/2021 0328   RDW 12.9 01/31/2021 0328   RDW 12.4 10/03/2020 1144   LYMPHSABS 4.4 (H) 01/28/2021 1640   LYMPHSABS 2.5 10/03/2020 1144   MONOABS 1.0 02/02/2021 1640   EOSABS 0.1 02/05/2021 1640   EOSABS 0.1 10/03/2020 1144   BASOSABS 0.1 01/31/2021 1640   BASOSABS 0.1 10/03/2020 1144    BMET    Component Value Date/Time   NA 137 01/31/2021 0328   NA 138 10/05/2020 1023   K 4.7 01/31/2021 0328   CL 103 01/31/2021 0328   CO2 23 01/31/2021 0328   GLUCOSE 236 (H) 01/31/2021 0328   BUN 49 (H) 01/31/2021 0328   BUN 32 (H) 10/05/2020 1023   CREATININE 2.98 (H) 01/31/2021 0328   CALCIUM 8.2 (L) 01/31/2021 0328   GFRNONAA 21 (L) 01/31/2021  0328   GFRAA 47 (L) 01/10/2020 1113    CXR images right lower lobe infiltrate, ett in place  Impression/Plan: NSTEMI/Cardiogenic shock, systolic heart failure> wean off levophed for MAP > 65, ongoing management per cardiology, OK to stop heparin from my standpoint Acute respiratory failure with hypoxemia> pressure support/wean vent today if mental status allows AKI> nephrology consult, continue levophed, monitor UOP/BMET Aspiration pneumonia> ceftriaxone/azithro, resp culture pending DM2/Hyperglycemia> stop tube feeding (assuming he's extubated) and monitor afterwards Acute metabolic encephalopathy> send for stat head CT now, hold sedation  My cc time 31 minutes  Heber Oconto, MD  PCCM Pager: [phone] Cell: [phone] After 7pm: [phone]

## 2021-01-31 NOTE — Plan of Care (Signed)
  Interdisciplinary Goals of Care Family Meeting   Date carried out:: 01/31/2021  Location of the meeting: Bedside  Member's involved: Physician and Family Member or next of kin  Durable Power of Attorney or Environmental health practitioner: Wife    Discussion: We discussed goals of care for [name] .  I explained the severe nature of the stroke and his ongoing cardiogenic shock with respiratory failure. His prognosis is poor. She said she doesn't want him to suffer.  DNR now, if not awake and alert by the morning then withdrawal of care tomorrow.  Code status: Full DNR  Disposition: Continue current acute care  Time spent for the meeting: 15 minutes  Benjamin Chan 01/31/2021, 6:46 PM

## 2021-02-01 ENCOUNTER — Inpatient Hospital Stay
Admit: 2021-02-01 | Discharge: 2021-02-01 | Disposition: A | Discharge status: Home / Self Care | Payer: Medicare HMO | Attending: Pulmonary Disease | Admitting: Pulmonary Disease

## 2021-02-01 DIAGNOSIS — I2119 ST elevation (STEMI) myocardial infarction involving other coronary artery of inferior wall: Secondary | ICD-10-CM | POA: Diagnosis not present

## 2021-02-01 DIAGNOSIS — I639 Cerebral infarction, unspecified: Secondary | ICD-10-CM

## 2021-02-01 DIAGNOSIS — J9600 Acute respiratory failure, unspecified whether with hypoxia or hypercapnia: Secondary | ICD-10-CM

## 2021-02-01 DIAGNOSIS — R4189 Other symptoms and signs involving cognitive functions and awareness: Secondary | ICD-10-CM

## 2021-02-01 DIAGNOSIS — Z515 Encounter for palliative care: Secondary | ICD-10-CM

## 2021-02-01 DIAGNOSIS — I6389 Other cerebral infarction: Secondary | ICD-10-CM

## 2021-02-01 DIAGNOSIS — J9601 Acute respiratory failure with hypoxia: Secondary | ICD-10-CM

## 2021-02-01 DIAGNOSIS — A419 Sepsis, unspecified organism: Secondary | ICD-10-CM

## 2021-02-01 DIAGNOSIS — G9340 Encephalopathy, unspecified: Secondary | ICD-10-CM

## 2021-02-01 DIAGNOSIS — I5023 Acute on chronic systolic (congestive) heart failure: Secondary | ICD-10-CM

## 2021-02-01 DIAGNOSIS — D72829 Elevated white blood cell count, unspecified: Secondary | ICD-10-CM

## 2021-02-01 DIAGNOSIS — Z7189 Other specified counseling: Secondary | ICD-10-CM

## 2021-02-01 LAB — ECHOCARDIOGRAM LIMITED
Height: 65.984 in
S' Lateral: 6.3 cm
Weight: 3174.62 oz

## 2021-02-01 LAB — CULTURE, RESPIRATORY W GRAM STAIN: Culture: NORMAL

## 2021-02-01 LAB — BASIC METABOLIC PANEL
Anion gap: 9 (ref 5–15)
BUN: 74 mg/dL — ABNORMAL HIGH (ref 8–23)
CO2: 23 mmol/L (ref 22–32)
Calcium: 8.5 mg/dL — ABNORMAL LOW (ref 8.9–10.3)
Chloride: 105 mmol/L (ref 98–111)
Creatinine, Ser: 2.75 mg/dL — ABNORMAL HIGH (ref 0.61–1.24)
GFR, Estimated: 23 mL/min — ABNORMAL LOW (ref >60)
Glucose, Bld: 273 mg/dL — ABNORMAL HIGH (ref 70–99)
Potassium: 4.5 mmol/L (ref 3.5–5.1)
Sodium: 137 mmol/L (ref 135–145)

## 2021-02-01 LAB — CBC
HCT: 40.3 % (ref 39.0–52.0)
Hemoglobin: 12.9 g/dL — ABNORMAL LOW (ref 13.0–17.0)
MCH: 29.9 pg (ref 26.0–34.0)
MCHC: 32 g/dL (ref 30.0–36.0)
MCV: 93.5 fL (ref 80.0–100.0)
Platelets: 200 10*3/uL (ref 150–400)
RBC: 4.31 MIL/uL (ref 4.22–5.81)
RDW: 12.9 % (ref 11.5–15.5)
WBC: 17.5 10*3/uL — ABNORMAL HIGH (ref 4.0–10.5)
nRBC: 0 % (ref 0.0–0.2)

## 2021-02-01 LAB — CULTURE, BLOOD (ROUTINE X 2): Special Requests: ADEQUATE

## 2021-02-01 LAB — MAGNESIUM: Magnesium: 2.3 mg/dL (ref 1.7–2.4)

## 2021-02-01 LAB — LIPID PANEL
Cholesterol: 122 mg/dL (ref 0–200)
HDL: 43 mg/dL (ref >40)
LDL Cholesterol: 53 mg/dL (ref 0–99)
Total CHOL/HDL Ratio: 2.8 RATIO
Triglycerides: 128 mg/dL (ref <150)
VLDL: 26 mg/dL (ref 0–40)

## 2021-02-01 LAB — HEPATITIS B SURFACE ANTIBODY,QUALITATIVE: Hep B S Ab: NONREACTIVE

## 2021-02-01 LAB — TRIGLYCERIDES: Triglycerides: 136 mg/dL (ref <150)

## 2021-02-01 LAB — PHOSPHORUS: Phosphorus: 3.8 mg/dL (ref 2.5–4.6)

## 2021-02-01 LAB — HEMOGLOBIN A1C
Hgb A1c MFr Bld: 6.7 % — ABNORMAL HIGH (ref 4.8–5.6)
Mean Plasma Glucose: 145.59 mg/dL

## 2021-02-01 LAB — PROCALCITONIN: Procalcitonin: 1.44 ng/mL

## 2021-02-01 LAB — GLUCOSE, CAPILLARY
Glucose-Capillary: 222 mg/dL — ABNORMAL HIGH (ref 70–99)
Glucose-Capillary: 255 mg/dL — ABNORMAL HIGH (ref 70–99)

## 2021-02-01 LAB — HEPATITIS B CORE ANTIBODY, IGM: Hep B C IgM: NONREACTIVE

## 2021-02-01 LAB — HEPATITIS C ANTIBODY: HCV Ab: NONREACTIVE

## 2021-02-01 LAB — HEPATITIS B SURFACE ANTIGEN: Hepatitis B Surface Ag: NONREACTIVE

## 2021-02-01 LAB — HEPATITIS B CORE ANTIBODY, TOTAL: Hep B Core Total Ab: NONREACTIVE

## 2021-02-01 MED ORDER — ONDANSETRON HCL 4 MG/2ML IJ SOLN: 4.0000 mg | Freq: Four times a day (QID) | INTRAMUSCULAR | Status: DC | PRN

## 2021-02-01 MED ORDER — MIDAZOLAM HCL 2 MG/2ML IJ SOLN
2.0000 mg | INTRAMUSCULAR | Status: DC | PRN
Start: 2021-02-01 — End: 2021-02-02
  Administered 2021-02-01 (×4): 2 mg via INTRAVENOUS
  Filled 2021-02-01 (×4): qty 2

## 2021-02-01 MED ORDER — ACETAMINOPHEN 325 MG PO TABS: 650.0000 mg | ORAL_TABLET | Freq: Four times a day (QID) | ORAL | Status: DC | PRN

## 2021-02-01 MED ORDER — GLYCOPYRROLATE 0.2 MG/ML IJ SOLN
0.4000 mg | INTRAMUSCULAR | Status: DC | PRN
  Administered 2021-02-01 (×4): 0.4 mg via INTRAVENOUS
  Filled 2021-02-01 (×4): qty 2

## 2021-02-01 MED ORDER — ONDANSETRON 4 MG PO TBDP
4.0000 mg | ORAL_TABLET | Freq: Four times a day (QID) | ORAL | Status: DC | PRN
  Filled 2021-02-01: qty 1

## 2021-02-01 MED ORDER — ACETAMINOPHEN 650 MG RE SUPP: 650.0000 mg | Freq: Four times a day (QID) | RECTAL | Status: DC | PRN

## 2021-02-01 MED ORDER — LORAZEPAM 2 MG/ML PO CONC
1.0000 mg | ORAL | Status: DC | PRN
  Filled 2021-02-01: qty 0.5

## 2021-02-01 MED ORDER — MORPHINE BOLUS VIA INFUSION
2.0000 mg | INTRAVENOUS | Status: DC | PRN
Start: 2021-02-01 — End: 2021-02-02
  Administered 2021-02-01: 4 mg via INTRAVENOUS
  Administered 2021-02-01 (×2): 2 mg via INTRAVENOUS
  Administered 2021-02-01: 4 mg via INTRAVENOUS
  Administered 2021-02-01: 2 mg via INTRAVENOUS
  Administered 2021-02-01 (×2): 4 mg via INTRAVENOUS
  Filled 2021-02-01: qty 4

## 2021-02-01 MED ORDER — LORAZEPAM 2 MG/ML IJ SOLN
1.0000 mg | INTRAMUSCULAR | Status: DC | PRN
  Administered 2021-02-01: 1 mg via INTRAVENOUS
  Filled 2021-02-01: qty 1

## 2021-02-01 MED ORDER — MORPHINE 100MG IN NS 100ML (1MG/ML) PREMIX INFUSION
2.0000 mg/h | INTRAVENOUS | Status: DC
  Administered 2021-02-01: 1 mg/h via INTRAVENOUS
  Administered 2021-02-01: 10 mg/h via INTRAVENOUS
  Filled 2021-02-01 (×2): qty 100

## 2021-02-01 MED ORDER — MORPHINE BOLUS VIA INFUSION
2.0000 mg | INTRAVENOUS | Status: DC | PRN
  Administered 2021-02-01 (×4): 2 mg via INTRAVENOUS
  Filled 2021-02-01: qty 2

## 2021-02-01 MED ORDER — LORAZEPAM 1 MG PO TABS: 1.0000 mg | ORAL_TABLET | ORAL | Status: DC | PRN

## 2021-02-03 LAB — CULTURE, BLOOD (SINGLE)
Culture: NO GROWTH
Special Requests: ADEQUATE

## 2021-02-04 LAB — CULTURE, BLOOD (ROUTINE X 2)
Culture: NO GROWTH
Special Requests: ADEQUATE

## 2021-02-05 ENCOUNTER — Other Ambulatory Visit: Payer: Self-pay | Admitting: Family Medicine

## 2021-02-14 ENCOUNTER — Telehealth: Payer: Self-pay | Admitting: Pulmonary Disease

## 2021-02-14 NOTE — Consult Note (Signed)
Palliative Care Consult Note                                  Date: 02-13-2021   Patient Name: Benjamin Chan  DOB: 02-05-1946  MRN: [id number]  Age / Sex: 75 y.o., male  PCP: Jon Billings, NP Referring Physician: Bennie Pierini, MD  Reason for Consultation: Terminal Care and Withdrawal of life-sustaining treatment  HPI/Patient Profile: 75 y.o. male  with past medical history of  hypertension, diabetes, CHF, coronary artery disease status post CABG, hyperlipidemia, BPH, CKD. He presented to the ED with chest pain and was admitted on 01/30/2021 with anterior STEMI, required intubation in the ER.  Cardiac cath completed.  He remains in cardiogenic shock requiring vasopressors.  Concern for aspiration pneumonia as well and was started on antibiotics.  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  Given the patient's poor overall prognosis and poor current clinical situation, the patient's wife and family agreed to compassionate extubation and shift focus to comfort care.  PMT was consulted to assist with this.  Past Medical History:  Diagnosis Date   Arthritis    shoulder   BPH (benign prostatic hyperplasia)    CAD (coronary artery disease)    Chronic kidney disease    COPD (chronic obstructive pulmonary disease) (HCC)    Diabetes mellitus without complication ([place])    Diabetic ketoacidosis without coma associated with type 2 diabetes mellitus ([place])    Heart attack ([place]) 06/2019   Hypertension    MI (myocardial infarction) ([place]) 03/2017   Non-ST elevation myocardial infarction (NSTEMI), subendocardial infarction, subsequent episode of care [facility]) 04/14/2017   NSTEMI (non-ST elevated myocardial infarction) ([place]) 07/14/2019    Subjective:   This NP Walden Field reviewed medical records, received report from team, assessed the patient and then meet at the  patient's bedside to discuss diagnosis, prognosis, GOC, EOL wishes disposition and options.  I met with the patient's wife and a number of family members at bedside.   Concept of Palliative Care was introduced as specialized medical care for people and their families living with serious illness.  If focuses on providing relief from the symptoms and stress of a serious illness.  The goal is to improve quality of life for both the patient and the family. Values and goals of care important to patient and family were attempted to be elicited.  Created space and opportunity for patient  and family to explore thoughts and feelings regarding current medical situation   Natural trajectory and current clinical status were discussed. Questions and concerns addressed. Patient  encouraged to call with questions or concerns.    Patient/Family Understanding of Illness: They understand he is very ill and is not likely recovery.  Today's Discussion: Because the patient has already been terminally extubated with comfort care including morphine drip, we had a brief conversation confirming their wish for comfort care.  I expressed my condolences and support in this difficult time.  I ensured them that the rate has medications available for the nursing staff [place] for comfort, dignity, happiness at the end of life.  I also expressed my happiness that there was a large support system with multiple family members present to support each other during this difficult time.  I asked him to call us if we can be of any further assistance.  I offered  emotional general support through therapeutic listening and other techniques.  I answered all questions and addressed all concerns.  I was able to speak with the nurse at the nurses station afterwards.  She stress concern that the patient is tachypneic and requiring near maximum current range of morphine drip (6 mg an hour).  After speaking with the critical care physician I will  plan to adjust morphine drip.  She also stated she is about the Robinul as he is having some end-of-life secretions.  She is aware to contact us for any further symptom management needs.  Review of Systems  Unable to perform ROS: Acuity of condition   Objective:   Primary Diagnoses: Present on Admission:  STEMI (ST elevation myocardial infarction) ([place])  ST elevation myocardial infarction (STEMI), unspecified artery [facility])   Physical Exam Vitals and nursing note reviewed.  Constitutional:      General: He is not in acute distress.    Appearance: He is ill-appearing and toxic-appearing.     Comments: Appears actively dying  HENT:     Head: Normocephalic and atraumatic.  Cardiovascular:     Rate and Rhythm: Tachycardia present. Rhythm irregular.  Pulmonary:     Effort: Tachypnea present. No respiratory distress.  Skin:    General: Skin is warm and dry.    Vital Signs:  BP 106/68   Pulse (!) 111   Temp (!) 101.8 F (38.8 C) Comment: room cooled down  Resp (!) 50   Ht 5' 5.98" (1.676 m)   Wt 90 kg   SpO2 (!) 85%   BMI 32.04 kg/m   Palliative Assessment/Data: 10%    Advanced Care Planning:   Primary Decision Maker: NEXT OF KIN  Code Status/Advance Care Planning: DNR  Decisions/Changes to ACP: None at this time  Assessment & Plan:   Impression: 75 year old male acutely ill now facing end-of-life, currently on comfort care status post compassionate extubation.  He appears somewhat tachypneic and nurses requesting adjustment of morphine drip which I will complete.  Emotional support provided with family.  Anticipate in-hospital death in a relatively short amount of time.  SUMMARY OF RECOMMENDATIONS   Continue comfort care Adjust morphine gtt now and again as needed Call PMT for any addition needs  Symptom Management:  Increase morphine to 2-10 mg/hr basal rate for tachypnea, respiratory distress, or pain Continue Robinul, Versed as needed per PCCM  orders  Prognosis:  Hours - Days  Discharge Planning:  Anticipated Hospital Death   Discussed with: Medical team, nursing team, patient's family   Thank you for allowing [place] to participate in the care of SHOTA KOHRS PMT will continue to support holistically.  Time Total: 50 min  Greater than 50%  of this time was spent counseling and coordinating care related to the above assessment and plan.  Signed by: Walden Field, NP Palliative Medicine Team  Team Phone # [phone] (Nights/Weekends)  Feb 28, 2021, 1:52 PM

## 2021-02-14 NOTE — Progress Notes (Signed)
Goals of Care Discussion:  I continued discussions with the patient's wife, Benjamin Chan, along with many additional members of her family. She spoke with Dr. Kendrick Fries yesterday and was able to provide an in-depth understanding of her husband's current clinical condition, including a heart attack, multiple strokes, sepsis and kidney failure. 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 Given Benjamin Chan's previously stated goals, Benjamin Chan felt that he would not want further intervention given his poor clinical condition and poor overall prognosis. She expressed her clear wishes to transition to Comfort Care measures and pursue compassionate extubation. This decision was also agreed upon by her family.  Code status changed to Comfort Care and compassionate extubation performed.  Benjamin Baldy, MD 26-Feb-2021 1:40 PM

## 2021-02-14 NOTE — Progress Notes (Signed)
SLP Cancellation Note  Patient Details Name: Benjamin Chan MRN: [id number] DOB: 1945-04-12   Cancelled treatment:       Reason Eval/Treat Not Completed: Patient not medically ready;Medical issues which prohibited therapy (chart reviewed; pt intubated.). Pt has had a significant decline and is not medically appropriate for ST services. Confirmed with RN to discontinue orders. Please re-consult if pt status changes.     Jerilynn Som, MS, CCC-SLP Speech Language Pathologist Rehab Services [phone] [facility] 02-25-2021, 4:40 PM

## 2021-02-14 NOTE — Progress Notes (Signed)
OT Cancellation Note  Patient Details Name: Benjamin Chan MRN: [id number] DOB: [birth year]   Cancelled Treatment:    Reason Eval/Treat Not Completed: Medical issues which prohibited therapy. OT order received and chart reviewed. Pt currently with red MEWS and score of 7. RN reports pt with significant decline since order placed. OT to complete order and await new orders once pt is medically able to participate in OT intervention.   Jackquline Denmark, MS, OTR/L , CBIS ascom [phone]  02-02-21, 10:32 AM  Feb 02, 2021, 10:30 AM

## 2021-02-14 NOTE — Progress Notes (Signed)
PRN morphine boluses and prn versed administered for RR >25. Robinul prn for secretions.

## 2021-02-14 NOTE — Progress Notes (Signed)
Provided presence and support to family as a follow up from previous chaplain. Family has filled the room with laughter and support for one another. They shared stories and reliance on their shared faith.

## 2021-02-14 NOTE — Progress Notes (Signed)
Neurology Progress Note   S:// Seen and examined. No acute changes    O:// Current vital signs: BP 111/65   Pulse 97   Temp (!) 101.8 F (38.8 C) Comment: room cooled down  Resp (!) 35   Ht 5' 5.98" (1.676 m)   Wt 90 kg   SpO2 95%   BMI 32.04 kg/m  Vital signs in last 24 hours: Temp:  [99 F (37.2 C)-101.8 F (38.8 C)] 101.8 F (38.8 C) (11/18 0800) Pulse Rate:  [90-121] 97 (11/18 1030) Resp:  [22-41] 35 (11/18 1030) BP: (86-125)/(58-81) 111/65 (11/18 1030) SpO2:  [90 %-100 %] 95 % (11/18 1030) Arterial Line BP: (76-126)/(57-80) 112/67 (11/18 1030) FiO2 (%):  [40 %] 40 % (11/18 0737) Weight:  [90 kg] 90 kg (11/18 0145)  Sedated intubated-on propofol 20 No spontaneous movements No eye-opening today Pupils equal round react to light Breathing over the ventilator No withdrawal to noxious stimulation  Medications  Current Facility-Administered Medications:     stroke: mapping our early stages of recovery book, , Does not apply, Once, Amie Portland, MD   0.9 %  sodium chloride infusion, 250 mL, Intravenous, PRN, End, Harrell Gave, MD   acetaminophen (TYLENOL) tablet 650 mg, 650 mg, Per Tube, Q4H PRN, End, Christopher, MD   aspirin chewable tablet 81 mg, 81 mg, Per Tube, Daily, End, Christopher, MD, 81 mg at 01/31/21 0847   atorvastatin (LIPITOR) tablet 80 mg, 80 mg, Per Tube, QHS, End, Christopher, MD, 80 mg at 01/31/21 2114   azithromycin (ZITHROMAX) tablet 500 mg, 500 mg, Per Tube, Daily, Benita Gutter, RPH, 500 mg at 01/31/21 1032   cefTRIAXone (ROCEPHIN) 2 g in sodium chloride 0.9 % 100 mL IVPB, 2 g, Intravenous, Q24H, McQuaid, Douglas B, MD, Stopped at 01/31/21 1102   chlorhexidine gluconate (MEDLINE KIT) (PERIDEX) 0.12 % solution 15 mL, 15 mL, Mouth Rinse, BID, McQuaid, Douglas B, MD, 15 mL at 2021/02/03 0749   Chlorhexidine Gluconate Cloth 2 % PADS 6 each, 6 each, Topical, Q0600, Mannam, Praveen, MD, 6 each at 01/31/21 2230   clopidogrel (PLAVIX) tablet 75 mg,  75 mg, Per Tube, Daily, End, Christopher, MD, 75 mg at 01/31/21 0847   docusate (COLACE) 50 MG/5ML liquid 100 mg, 100 mg, Per Tube, BID, McQuaid, Douglas B, MD, 100 mg at 01/31/21 2115   ezetimibe (ZETIA) tablet 10 mg, 10 mg, Per Tube, Daily, End, Christopher, MD, 10 mg at 01/31/21 0847   feeding supplement (PROSource TF) liquid 90 mL, 90 mL, Per Tube, BID, McQuaid, Douglas B, MD, 90 mL at 01/31/21 2115   feeding supplement (VITAL HIGH PROTEIN) liquid 1,000 mL, 1,000 mL, Per Tube, Continuous, McQuaid, Douglas B, MD, Last Rate: 45 mL/hr at 01/31/21 1704, 1,000 mL at 01/31/21 1704   fentaNYL (SUBLIMAZE) injection 50 mcg, 50 mcg, Intravenous, Q15 min PRN, Simonne Maffucci B, MD   fentaNYL (SUBLIMAZE) injection 50-200 mcg, 50-200 mcg, Intravenous, Q30 min PRN, Simonne Maffucci B, MD, 50 mcg at 2021-02-03 0900   finasteride (PROSCAR) tablet 5 mg, 5 mg, Oral, Daily, End, Christopher, MD, 5 mg at 01/31/21 0848   free water 30 mL, 30 mL, Per Tube, Q4H, McQuaid, Douglas B, MD, 30 mL at 02-03-2021 0748   furosemide (LASIX) injection 40 mg, 40 mg, Intravenous, Daily, Kolluru, Sarath, MD, 40 mg at 01/31/21 1234   heparin injection 5,000 Units, 5,000 Units, Subcutaneous, Q8H, Callwood, Dwayne D, MD, 5,000 Units at February 03, 2021 0507   insulin aspart (novoLOG) injection 0-15 Units, 0-15 Units, Subcutaneous, Q4H, Mannam,  Praveen, MD, 8 Units at 25-Feb-2021 0759   ipratropium-albuterol (DUONEB) 0.5-2.5 (3) MG/3ML nebulizer solution 3 mL, 3 mL, Nebulization, Q4H PRN, Mannam, Praveen, MD   MEDLINE mouth rinse, 15 mL, Mouth Rinse, 10 times per day, Simonne Maffucci B, MD, 15 mL at 02/25/21 0508   midazolam (VERSED) injection 2 mg, 2 mg, Intravenous, Q2H PRN, Mannam, Praveen, MD, 2 mg at 02-25-2021 1022   norepinephrine (LEVOPHED) 16 mg in 236m premix infusion, 0-40 mcg/min, Intravenous, Continuous, MMaryjane Hurter MD, Last Rate: 11.25 mL/hr at 112-12-20220747, 12 mcg/min at 1Dec 12, 20220747   ondansetron (ZOFRAN) injection 4 mg, 4  mg, Intravenous, Q6H PRN, End, Christopher, MD   pantoprazole (PROTONIX) injection 40 mg, 40 mg, Intravenous, Q24H, Mannam, Praveen, MD, 40 mg at 01/31/21 2202   polyethylene glycol (MIRALAX / GLYCOLAX) packet 17 g, 17 g, Per Tube, Daily, McQuaid, Douglas B, MD, 17 g at 01/31/21 1241   propofol (DIPRIVAN) 1000 MG/100ML infusion, 5-80 mcg/kg/min, Intravenous, Titrated, MMaryjane Hurter MD, Last Rate: 10.82 mL/hr at 1December 12, 20220747, 20 mcg/kg/min at 112/12/220747   sodium chloride flush (NS) 0.9 % injection 3 mL, 3 mL, Intravenous, Q12H, End, Christopher, MD, 3 mL at 01/31/21 2114   sodium chloride flush (NS) 0.9 % injection 3 mL, 3 mL, Intravenous, PRN, End, Christopher, MD Labs CBC    Component Value Date/Time   WBC 17.5 (H) 12022-12-120343   RBC 4.31 112/12/220343   HGB 12.9 (L) 12022-12-120343   HGB 14.1 10/03/2020 1144   HCT 40.3 112-12-220343   HCT 40.7 10/03/2020 1144   PLT 200 112-12-220343   PLT 289 10/03/2020 1144   MCV 93.5 112-12-20220343   MCV 86 10/03/2020 1144   MCH 29.9 1Dec 12, 20220343   MCHC 32.0 1Dec 12, 20220343   RDW 12.9 1Dec 12, 20220343   RDW 12.4 10/03/2020 1144   LYMPHSABS 4.4 (H) 01/31/2021 1640   LYMPHSABS 2.5 10/03/2020 1144   MONOABS 1.0 01/28/2021 1640   EOSABS 0.1 01/31/2021 1640   EOSABS 0.1 10/03/2020 1144   BASOSABS 0.1 02/09/2021 1640   BASOSABS 0.1 10/03/2020 1144    CMP     Component Value Date/Time   NA 137 12022-12-120343   NA 138 10/05/2020 1023   K 4.5 12022/12/120343   CL 105 112-12-220343   CO2 23 112/12/20220343   GLUCOSE 273 (H) 1December 12, 20220343   BUN 74 (H) 112/12/220343   BUN 32 (H) 10/05/2020 1023   CREATININE 2.75 (H) 112/12/220343   CALCIUM 8.5 (L) 12022/12/120343   PROT 7.8 01/19/2021 1640   PROT 7.2 10/05/2020 1023   ALBUMIN 4.2 01/22/2021 1640   ALBUMIN 4.5 10/05/2020 1023   AST 31 01/28/2021 1640   ALT 24 02/04/2021 1640   ALKPHOS 82 01/28/2021 1640   BILITOT 0.9 01/19/2021 1640   BILITOT 0.5 10/05/2020  1023   GFRNONAA 23 (L) 12022-12-120343   GFRAA 47 (L) 01/10/2020 1113    Lipid Panel     Component Value Date/Time   CHOL 122 1Dec 12, 20220343   CHOL 153 10/03/2020 1144   TRIG 136 112/12/20220344   HDL 43 112/12/20220343   HDL 36 (L) 10/03/2020 1144   CHOLHDL 2.8 112-12-220343   VLDL 26 112/12/20220343   LDLCALC 53 1Dec 12, 20220343   LDLCALC 91 10/03/2020 1144   LDL 53 A1c 6.7  Repeat 2D echo pending Initial echocardiogram with EF of 25 to 30%, left ventricle has severely decreased function left  ventricle demonstrates global hypokinesis.  Also grade 2 diastolic dysfunction.  Left atrium mild dilatation.  Mitral valve grossly normal.  Aortic valve not well visualized  Imaging I have reviewed images in epic and the results pertinent to this consultation are: MRI brain with numerous supratentorial and infratentorial infarcts-embolic plus watershed etiology. MRA head motion limited without emergent LVO.  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  Probable moderate left M1 stenosis MRA neck poor quality.  Assessment: 75 year old past history of diabetes, CHF, coronary artery disease status post CABG, hyperlipidemia, CKD, BPH presented on 01/30/2019 with chest pain, noted to have inferior STEMI as well as cardiogenic shock, taken for cardiac cath without emergent intervention due to no amenable lesion-noted to have severe native coronary artery disease similar to prior studies from 2021, initially was following commands till midmorning 2 days ago but yesterday stopped following commands for which a CT head was done that was suspicious for new strokes in the bilateral cerebellar regions which was followed by an MRI that showed embolic/watershed looking pattern of strokes. Likely etiology is the myocardial infarction.  Recommendations: Continue statin for goal LDL less than 70 Sugar management for A1c goal of less than 6.7 Repeat 2D echo pending-to rule  out LV thrombus Continue dual antiplatelets Given poor cardiac function and large stroke burden, neurological meaningful recovery less likely. Please call neurology with questions as needed  ADDENDUM Patient made comfort measures this afternoon. Neurology will sign off.    -- Amie Portland, MD Neurologist [facility] Pager: [phone]  CRITICAL CARE ATTESTATION Performed by: Amie Portland, MD Total critical care time: 31 minutes Critical care time was exclusive of separately billable procedures and treating other patients and/or supervising APPs/Residents/Students Critical care was necessary to treat or prevent imminent or life-threatening deterioration due to acute ischemic strokes in the setting of myocardial infarction. This patient is critically ill and at significant risk for neurological worsening and/or death and care requires constant monitoring. Critical care was time spent personally by me on the following activities: development of treatment plan with patient and/or surrogate as well as nursing, discussions with consultants, evaluation of patient's response to treatment, examination of patient, obtaining history from patient or surrogate, ordering and performing treatments and interventions, ordering and review of laboratory studies, ordering and review of radiographic studies, pulse oximetry, re-evaluation of patient's condition, participation in multidisciplinary rounds and medical decision making of high complexity in the care of this patient.

## 2021-02-14 NOTE — Progress Notes (Signed)
2100-Pt's family answered post-mortem questions.   2300- Family finished saying their goodbye and left. Pt's body prepared to go to morgue. Pt's body transferred off of unit.

## 2021-02-14 NOTE — Progress Notes (Signed)
Pt extubated per MD order, Pt placed on 2Lnc

## 2021-02-14 NOTE — Progress Notes (Signed)
NAME:  Benjamin Chan, MRN:  [id number], DOB:  [birth year], LOS: 3 ADMISSION DATE:  02/12/2021, CONSULTATION DATE: 02/13/2021 REFERRING MD: Benjamin Prairie, MD, CHIEF COMPLAINT: Chest pain, MI  History of Present Illness:  75 y.o. with history of hypertension, diabetes, CHF, coronary artery disease status post CABG, hyperlipidemia, BPH, CKD.  Presenting with chest pain, inferior ST elevation MI.  He was intubated in the emergency room.  Evaluated by cardiology and taken to the Cath Lab from the ED.   Pertinent  Medical History   Past Medical History:  Diagnosis Date   Arthritis    shoulder   BPH (benign prostatic hyperplasia)    CAD (coronary artery disease)    Chronic kidney disease    COPD (chronic obstructive pulmonary disease) (HCC)    Diabetes mellitus without complication (HCC)    Diabetic ketoacidosis without coma associated with type 2 diabetes mellitus (HCC)    Heart attack (HCC) 06/2019   Hypertension    MI (myocardial infarction) (HCC) 03/2017   Non-ST elevation myocardial infarction (NSTEMI), subendocardial infarction, subsequent episode of care [facility]) 04/14/2017   NSTEMI (non-ST elevated myocardial infarction) (HCC) 07/14/2019     Significant Hospital Events: Including procedures, antibiotic start and stop dates in addition to other pertinent events   11/15 admit, intubation.  Cardiac cath 11/16: Remains in shock, suspect cardiogenic.  Worsening Leukocytosis and Procalcitonin ~ perform infectious workup.  Hold Lasix due to worsening renal function 11/17: Weaning Vasopressors, FiO2 weaned to 40%, plan for SBT. Concern for Aspiration PNA (Tracheal aspirate with Gram + rods & cocci) ~ start Azithromycin & Ceftriaxone.  BC 1/4 bottles with MRSE (suspect contaminant). 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  This includes i95% proximal LAD stenosis, chronic total occlusion of the proximal LCx, and sequential 70% and 30% mid RCA stenoses.  Patent LIMA-LAD with 20% mid graft stenosis. Patent SVG-D2 with 30% proximal graft disease. Chronically occluded SVG-OM2. Severely elevated left ventricular filling pressure (LVEDP ~35 mmHg). Recommendations: No obvious culprit for patient acute angina and subtle inferior ST elevation.  I suspect his symptoms and EKG findings may be due to acute on chronic HFrEF complicated by worsening mitral regurgitation noted on bedside echocardiogram this evening. Wean norepinephrine as tolerated. Establish central venous access in ICU and obtain central venous O2 saturation.  Consider adding milrinone if SvO2 < 70%. Aggressive diuresis.  Interim History / Subjective:  Family discussion today with ultimate decision to transition to Comfort Care measures and pursue compassionate extubation.  Objective   Blood pressure 104/61, pulse 92, temperature (!) 101.8 F (38.8 C), resp. rate (!) 33, height 5' 5.98" (1.676 m), weight 90 kg, SpO2 97 %. CVP:  [8 mmHg-18 mmHg] 12 mmHg  Vent Mode: PRVC FiO2 (%):  [40 %] 40 % Set Rate:  [14 bmp-30 bmp] 14 bmp Vt Set:  [450 mL] 450 mL PEEP:  [5 cmH20] 5 cmH20 Plateau Pressure:  [14 cmH20] 14 cmH20   Intake/Output Summary (Last 24 hours) at 02/03/2021 0930 Last data filed at 2021-02-03 0747 Gross per 24 hour  Intake 962.77 ml  Output 2125 ml  Net -1162.23 ml   Filed Weights   01/22/2021 1743 01/31/21 0317 02-23-2021 0145  Weight: 88.3 kg 90.2 kg  90 kg    Examination: Gen:      Acutely ill appearing, intubated and lightly sedated, in NAD Lungs:    rhonchorous, mechanical breath sounds, tachypneic CV:         Regular rate and rhythm; s1s2, no murmurs Abd:      + bowel sounds; soft, no palpable masses, no distension Ext:    No edema; adequate peripheral perfusion Neuro: not following commands   Resolved Hospital Problem list     Assessment & Plan:   Goals of Care After detailed discussion with Benjamin Chan's wife and family, the decision was made to pursue Comfort Care measures and compassionately extubate the patient. Aggressive measures were discontinued.  Cardiogenic and septic shock -Maintain MAP >65 -Vasopressors as needed to maintain MAP goal -Follow [place] panel  Inferior STEMI Acute on Chronic HFrEF (LVEF 25-30%) Hs: ischemic cardiomyopathy, coronary artery disease Cardiac cath notes reviewed with patent grafts and no obvious culprit lesion He does have elevated LVEDP indicating decompensated heart failure -Continuous cardiac monitoring -Trend HS Troponin until peaked (103 ~ 14,142 ~ 21,778 ~) -Echocardiogram 11/16: LVEF 25-30%, Grade II diastolic dysfunction, Low normal RV systolic function -Cardiology following, appreciate input -Continue Heparin gtt -Diuresis as BP and renal function permits  Acute encephalopathy Bilateral watershed strokes -Maintain a RASS goal of 0 to -1 -Fentanyl gtt as needed to maintain RASS goal -Avoid sedating medications as able -Daily wake up assessment -CT with no acute abnormalities  Acute hypoxic respiratory failure in setting of Acute on Chronic HFrEF with Pulmonary Edema Hx: COPD -Full vent support, implement lung protective strategies -Plateau pressures less than 30 cm H20 -Wean FiO2 & PEEP as tolerated to maintain O2 sats >92% -Follow intermittent Chest X-ray & ABG as needed -Spontaneous Breathing Trials when respiratory parameters met and mental status  permits -Implement VAP Bundle -Bronchodilators -Diuresis as BP and renal function permits  Leukocytosis Concern for Aspiration Pneumonia (Tracheal aspirate with Gram + cocci & rods) No obvious source of infection : UA is negative, CXR not concerning for Pneumonia, no signs of skin infection, abdominal exam is benign -Monitor fever curve -Trend WBC's & Procalcitonin -Follow cultures as above -Empiric antibiotics pending cultures & sensitivities  AKI on Chronic kidney disease Mild Hyperkalemia ~ resolved -Monitor I&O's / urinary output -Follow BMP -Ensure adequate renal perfusion -Avoid nephrotoxic agents as able -Replace electrolytes as indicated -Nephrology consult, appreciate input  Diabetes Mellitus -CBG's q4h; Target range of 140 to 180 -SSI -Follow ICU Hypo/Hyperglycemia protocol   Best Practice (right click and "Reselect all SmartList Selections" daily)   Diet/type: NPO, tube feeds via OGT DVT prophylaxis: systemic heparin GI prophylaxis: PPI Lines: Central line and is still needed Foley:  Yes, and it is still needed Code Status:  full code Last date of multidisciplinary goals of care discussion [01/31/2021]   Labs   CBC: Recent Labs  Lab 02/11/2021 1640 01/30/21 0412 01/31/21 0328 23-Feb-2021 0343  WBC 13.9* 18.4* 18.4* 17.5*  NEUTROABS 8.2*  --   --   --   HGB 14.7 13.7 13.3 12.9*  HCT 44.2 41.6 40.7 40.3  MCV 91.9 92.4 95.1 93.5  PLT 284 270 218 349    Basic Metabolic Panel: Recent Labs  Lab 01/20/2021 1640 01/30/21 0412 01/31/21 0328 01/31/21 1127 01/31/21 1705 02/23/21 0343  NA 136 134* 137  --   --  137  K 3.3* 5.2* 4.7  --   --  4.5  CL 104 103 103  --   --  105  CO2 $Re'22 23 23  'PTC$ --   --  23  GLUCOSE 192* 302* 236*  --   --  273*  BUN 32* 38* 49*  --   --  74*  CREATININE 1.91* 2.64* 2.98*  --   --  2.75*  CALCIUM 8.6* 8.0* 8.2*  --   --  8.5*  MG 2.5* 2.4  --  2.4 2.4 2.3  PHOS  --  7.2*  --  4.0 4.6 3.8   GFR: Estimated Creatinine  Clearance: 23.6 mL/min (A) (by C-G formula based on SCr of 2.75 mg/dL (H)). Recent Labs  Lab 01/23/2021 1640 01/24/2021 2233 01/30/21 0412 01/31/21 0328 Feb 22, 2021 0343  PROCALCITON  --  0.65 1.44 1.85 1.44  WBC 13.9*  --  18.4* 18.4* 17.5*  LATICACIDVEN 3.7* 2.2* 1.8  --   --     Liver Function Tests: Recent Labs  Lab 02/08/2021 1640  AST 31  ALT 24  ALKPHOS 82  BILITOT 0.9  PROT 7.8  ALBUMIN 4.2   No results for input(s): LIPASE, AMYLASE in the last 168 hours. No results for input(s): AMMONIA in the last 168 hours.  ABG    Component Value Date/Time   PHART 7.31 (L) 01/30/2021 0436   PCO2ART 46 01/30/2021 0436   PO2ART 133 (H) 01/30/2021 0436   HCO3 23.2 01/30/2021 0436   ACIDBASEDEF 3.3 (H) 01/30/2021 0436   O2SAT 98.8 01/30/2021 0436      Coagulation Profile: Recent Labs  Lab 02/07/2021 1640  INR 1.1    Cardiac Enzymes: No results for input(s): CKTOTAL, CKMB, CKMBINDEX, TROPONINI in the last 168 hours.  HbA1C: HB A1C (BAYER DCA - WAIVED)  Date/Time Value Ref Range Status  01/05/2020 03:28 PM 6.6 <7.0 % Final    Comment:                                          Diabetic Adult            <7.0                                       Healthy Adult        4.3 - 5.7                                                           (DCCT/NGSP) American Diabetes Association's Summary of Glycemic Recommendations for Adults with Diabetes: Hemoglobin A1c <7.0%. More stringent glycemic goals (A1c <6.0%) may further reduce complications at the cost of increased risk of hypoglycemia.   04/07/2018 10:24 AM 11.4 (H) <7.0 % Final    Comment:                                          Diabetic Adult            <7.0  Healthy Adult        4.3 - 5.7                                                           (DCCT/NGSP) American Diabetes Association's Summary of Glycemic Recommendations for Adults with Diabetes: Hemoglobin A1c <7.0%. More  stringent glycemic goals (A1c <6.0%) may further reduce complications at the cost of increased risk of hypoglycemia.    Hgb A1c MFr Bld  Date/Time Value Ref Range Status  01/31/2021 10:33 PM 6.5 (H) 4.8 - 5.6 % Final    Comment:    (NOTE) Pre diabetes:          5.7%-6.4%  Diabetes:              >6.4%  Glycemic control for   <7.0% adults with diabetes   10/03/2020 11:44 AM 7.8 (H) 4.8 - 5.6 % Final    Comment:             Prediabetes: 5.7 - 6.4          Diabetes: >6.4          Glycemic control for adults with diabetes: <7.0     CBG: Recent Labs  Lab 01/31/21 1606 01/31/21 1933 01/31/21 2327 02-11-2021 0311 2021/02/11 0737  GLUCAP 179* 191* 222* 222* 255*    Review of Systems:   Unable to assess given patient status.  Past Medical History:  He,  has a past medical history of Arthritis, BPH (benign prostatic hyperplasia), CAD (coronary artery disease), Chronic kidney disease, COPD (chronic obstructive pulmonary disease) ([place]), Diabetes mellitus without complication ([place]), Diabetic ketoacidosis without coma associated with type 2 diabetes mellitus ([place]), Heart attack ([place]) (06/2019), Hypertension, MI (myocardial infarction) ([place]) (03/2017), Non-ST elevation myocardial infarction (NSTEMI), subendocardial infarction, subsequent episode of care [facility]) (04/14/2017), and NSTEMI (non-ST elevated myocardial infarction) ([place]) (07/14/2019).   Surgical History:   Past Surgical History:  Procedure Laterality Date   COLONOSCOPY WITH PROPOFOL N/A 05/02/2019   Procedure: COLONOSCOPY WITH PROPOFOL;  Surgeon: Lucilla Lame, MD;  Location: [place];  Service: Endoscopy;  Laterality: N/A;  Diabetic - insulin and oral meds   CORONARY ARTERY BYPASS GRAFT  2006   2 vessel   CORONARY STENT INTERVENTION N/A 03/30/2017   Procedure: CORONARY STENT INTERVENTION;  Surgeon: Yolonda Kida, MD;  Location: [place] CV LAB;  Service: Cardiovascular;  Laterality: N/A;   INGUINAL HERNIA  REPAIR     right   LEFT HEART CATH AND CORONARY ANGIOGRAPHY N/A 07/14/2019   Procedure: LEFT HEART CATH AND CORONARY ANGIOGRAPHY;  Surgeon: Teodoro Spray, MD;  Location: [place] CV LAB;  Service: Cardiovascular;  Laterality: N/A;   LEFT HEART CATH AND CORS/GRAFTS ANGIOGRAPHY N/A 03/30/2017   Procedure: LEFT HEART CATH AND CORS/GRAFTS ANGIOGRAPHY;  Surgeon: Corey Skains, MD;  Location: [place] CV LAB;  Service: Cardiovascular;  Laterality: N/A;   LEFT HEART CATH AND CORS/GRAFTS ANGIOGRAPHY N/A 02/13/2021   Procedure: LEFT HEART CATH AND CORS/GRAFTS ANGIOGRAPHY;  Surgeon: Nelva Bush, MD;  Location: [place] CV LAB;  Service: Cardiovascular;  Laterality: N/A;     Social History:   reports that he quit smoking about 12 years ago. His smoking use included cigarettes. He has never used smokeless tobacco. He reports that he does not drink alcohol  and does not use drugs.   Family History:  His family history includes Hypertension in his brother, brother, brother, brother, brother, father, mother, sister, sister, sister, sister, sister, and sister.   Allergies Allergies  Allergen Reactions   Brilinta [Ticagrelor] Shortness Of Breath    D/c by cardiology     Home Medications  Prior to Admission medications   Medication Sig Start Date End Date Taking? Authorizing Provider  acetaminophen (TYLENOL) 325 MG tablet Take 650 mg by mouth every 6 (six) hours as needed.    [provider]  albuterol (VENTOLIN HFA) 108 (90 Base) MCG/ACT inhaler Inhale 2 puffs into the lungs every 6 (six) hours as needed for wheezing or shortness of breath. 12/11/20   Jon Billings, NP  ASPIRIN LOW DOSE 81 MG EC tablet TAKE ONE TABLET BY MOUTH ONCE DAILY 11/14/20   Jon Billings, NP  atorvastatin (LIPITOR) 80 MG tablet TAKE ONE TABLET BY MOUTH AT BEDTIME 10/10/20   Jon Billings, NP  clopidogrel (PLAVIX) 75 MG tablet TAKE ONE TABLET BY MOUTH EVERY MORNING 09/18/20   Jon Billings, NP  Dulaglutide (TRULICITY) 3.57 SV/7.7LT SOPN Inject 0.75 mg into the skin once a week. Taking on Wednesdays    [provider]  ezetimibe (ZETIA) 10 MG tablet TAKE ONE TABLET BY MOUTH ONCE DAILY 11/14/20   Jon Billings, NP  finasteride (PROSCAR) 5 MG tablet Take 1 tablet (5 mg total) by mouth daily. 12/11/20   Jon Billings, NP  fluticasone (FLONASE) 50 MCG/ACT nasal spray Place 1 spray into both nostrils 2 (two) times daily. Pt takes twice a day 12/11/20   Jon Billings, NP  Fluticasone-Umeclidin-Vilant (TRELEGY ELLIPTA) 100-62.5-25 MCG/INH AEPB Inhale 1 puff into the lungs daily. 08/03/20   Jon Billings, NP  furosemide (LASIX) 40 MG tablet Take 60 mg by mouth daily. 12/28/19   [provider]  glipiZIDE (GLUCOTROL) 5 MG tablet TAKE ONE TABLET BY MOUTH EVERY MORNING 10/10/20   Jon Billings, NP  Insulin Glargine (BASAGLAR KWIKPEN) 100 UNIT/ML Inject 40 Units into the skin in the morning.    [provider]  Insulin Pen Needle (PEN NEEDLES 5/16") 30G X 8 MM MISC 1 each by Does not apply route daily. 02/21/20   Johnson, Megan P, DO  JARDIANCE 25 MG TABS tablet TAKE ONE TABLET BY MOUTH ONCE DAILY 08/16/20   Wynetta Emery, Megan P, DO  losartan (COZAAR) 100 MG tablet Take 1 tablet (100 mg total) by mouth daily. 07/26/20 01/30/21  Jon Billings, NP  metFORMIN (GLUCOPHAGE-XR) 500 MG 24 hr tablet Take 2 tablets (1,000 mg total) by mouth 2 (two) times daily. 07/26/20   Jon Billings, NP  metoprolol succinate (TOPROL-XL) 25 MG 24 hr tablet  12/29/19   [provider]  metoprolol succinate (TOPROL-XL) 50 MG 24 hr tablet Take by mouth. 08/20/20 08/20/21  [provider]  Multiple Vitamin (MULTIVITAMIN) TABS TAKE ONE TABLET BY MOUTH ONCE DAILY 07/23/20   Jon Billings, NP  nitroGLYCERIN (NITROSTAT) 0.4 MG SL tablet Place 1 tablet (0.4 mg total) under the tongue every 5 (five) minutes as needed for chest pain. 07/26/20   Jon Billings, NP   OneTouch Delica Lancets 90Z MISC 1 each by Does not apply route daily. 01/06/20   Johnson, Megan P, DO  spironolactone (ALDACTONE) 25 MG tablet Take 1 tablet (25 mg total) by mouth daily. 07/26/20 01/30/21  Jon Billings, NP     Critical care time: 45 minutes   The patient is critically ill with multiple organ system  failure and requires high complexity decision making for assessment and support, frequent evaluation and titration of therapies, advanced monitoring, review of radiographic studies and interpretation of complex data.    Bennie Pierini, MD 03-02-21 9:31 AM

## 2021-02-14 NOTE — Progress Notes (Signed)
*  PRELIMINARY RESULTS* Echocardiogram 2D Echocardiogram has been performed.  Cristela Blue Feb 11, 2021, 8:38 AM

## 2021-02-14 NOTE — Death Summary Note (Addendum)
DEATH SUMMARY   Patient Details  Name: Benjamin Chan MRN: [id number] DOB: Jun 06, 1945  Admission/Discharge Information   Admit Date:  02/21/2021  Date of Death: 2021-02-24   Time of Death:  20:11 PM  Length of Stay: 3  Referring Physician: Jon Billings, NP   Reason(s) for Hospitalization  Presenting with chest pain, inferior ST elevation MI.  He was intubated in the emergency room.  Evaluated by cardiology and taken to the Cath Lab from the ED.  Diagnoses  Preliminary cause of death:  Secondary Diagnoses (including complications and co-morbidities):  Principal Problem:   STEMI (ST elevation myocardial infarction) ([place]) Active Problems:   Acute kidney injury superimposed on chronic kidney disease (HCC)   T2DM (type 2 diabetes mellitus) ([place])   Cardiogenic shock (HCC)   Septic shock (HCC)   Acute on chronic HFrEF (heart failure with reduced ejection fraction) ([place])   Acute respiratory failure with hypoxia ([place])   Acute encephalopathy   Leukocytosis   Acute bilateral cerebral infarction in a watershed distribution [facility])   [place] [facility] Course (including significant findings, care, treatment, and services provided and events leading to death)  Benjamin Chan is a 75 y.o. year old male who was in his normal state of health until 3 p on 02/21/2021 when he developed substernal chest pain & left arm discomfort. EMS found the patient minimally responsive and he was transported to [facility] ED where he was emergently intubated for airway protection. He became hypotensive requiring levophed drip. Due to concern for STEMI he has taken emergently to the cath lab for [facility] showing severe but relatively stable CAD, LVEDP was noted to be severely elevated at 35 mmHg. Patient was admitted to ICU on heparin drip with plans for ventilator support, diuresis as tolerated & vasopressors PRN. 11/16: Remains in shock, suspect cardiogenic.  Worsening Leukocytosis and Procalcitonin ~ perform  infectious workup.  Hold Lasix due to worsening renal function. 11/17: Weaning Vasopressors, FiO2 weaned to 40%, plan for SBT. Concern for Aspiration PNA (Tracheal aspirate with Gram + rods & cocci) ~ start Azithromycin & Ceftriaxone.  BC 1/4 bottles with MRSE (suspect contaminant). Nephrology consulted for worsening renal failure. 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 Family discussion today with ultimate decision to transition to Portal measures and pursue compassionate extubation, palliative consulted to assist. Patient terminally extubated at 12:11 and passed away at 20:11 with family bedside.   Pertinent Labs and Studies  Significant Diagnostic Studies CT HEAD WO CONTRAST (5MM)  Result Date: 01/31/2021 CLINICAL DATA:  Mental status change, unknown cause EXAM: CT HEAD WITHOUT CONTRAST TECHNIQUE: Contiguous axial images were obtained from the base of the skull through the vertex without intravenous contrast. COMPARISON:  02/21/21. FINDINGS: Brain: New hypodensities in bilateral cerebellar hemispheres and right parieto-occipital cortex. No evidence of acute hemorrhage, mass lesion, midline shift, hydrocephalus, or extra-axial fluid collection. Moderate patchy white matter hypoattenuation, nonspecific but compatible with chronic microvascular ischemic disease. Vascular: No hyperdense vessel identified. Skull: No acute fracture. Sinuses/Orbits: Visualized sinuses are clear.  Unremarkable orbits. Other: No mastoid effusions. IMPRESSION: 1. New hypodensities in bilateral cerebellar hemispheres and right parieto-occipital cortex, concerning for acute infarcts. Recommend MRI to further evaluate. 2. Moderate chronic microvascular disease. Electronically Signed   By: Margaretha Sheffield M.D.   On: 01/31/2021 12:12   CT HEAD WO CONTRAST (5MM)  Result Date: 2021-02-21 CLINICAL DATA:  Found unresponsive. EXAM: CT HEAD WITHOUT CONTRAST  TECHNIQUE: Contiguous axial images  were obtained from the base of the skull through the vertex without intravenous contrast. COMPARISON:  None. FINDINGS: Brain: No evidence of acute infarction, hemorrhage, hydrocephalus, extra-axial collection or mass lesion/mass effect. Scattered areas of low-density in the white matter is suggestive for chronic small vessel ischemic changes. Vascular: No hyperdense vessel or unexpected calcification. Skull: Normal. Negative for fracture or focal lesion. Sinuses/Orbits: Mucosal thickening in the ethmoid air cells. Mastoid air cells are aerated. There is probably fluid and soft tissue fullness in the posterior nasal cavity. Other: None. IMPRESSION: 1. No acute intracranial abnormality. 2. Evidence for chronic small vessel ischemic changes. Electronically Signed   By: Markus Daft M.D.   On: 02/11/2021 17:12   MR ANGIO HEAD WO CONTRAST  Addendum Date: 01/31/2021   ADDENDUM REPORT: 01/31/2021 16:47 Electronically Signed   By: Margaretha Sheffield M.D.   On: 01/31/2021 16:47   Result Date: 01/31/2021 CLINICAL DATA:  Neuro deficit, acute, stroke suspected EXAM: MRI HEAD WITHOUT CONTRAST MRA HEAD WITHOUT CONTRAST MRA NECK WITHOUT CONTRAST TECHNIQUE: Multiplanar, multiecho pulse sequences of the brain and surrounding structures were obtained without intravenous contrast. Angiographic images of the Circle of Willis were obtained using MRA technique without intravenous contrast. Angiographic images of the neck were obtained using MRA technique without intravenous contrast. Carotid stenosis measurements (when applicable) are obtained utilizing NASCET criteria, using the distal internal carotid diameter as the denominator. COMPARISON:  None. FINDINGS: MRI HEAD FINDINGS Brain: Numerous small acute infarcts in bilateral cerebellar hemispheres, left middle cerebellar peduncle, pons (punctate), frontal, parietal, and occipital lobes and posterior temporal lobes. Supratentorial infarcts involve  cortex and white matter. Also, small acute infarcts in bilateral thalami and basal ganglia. Associated edema without mass effect. Generalized atrophy. Chronic microvascular ischemic disease. No acute hemorrhage, hydrocephalus, mass lesion, midline shift, or extra-axial fluid collection. Vascular: See below. Skull and upper cervical spine: Normal marrow signal. Sinuses/Orbits: Mild paranasal sinus mucosal thickening. Unremarkable orbits. Other: No mastoid effusions. MRA HEAD FINDINGS Motion limited study. Anterior circulation: Atherosclerotic narrowing of the intracranial ICAs without evidence of high-grade stenosis. Bilateral MCAs and ACAs are patent. Probably moderate left M1 MCA stenosis. Posterior circulation: Right dominant. 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 Bilateral PCAs are patent without high-grade stenosis. Left posterior communicating artery. MRA NECK FINDINGS Unfortunately, the neck vasculature was only imaged from at approximate level of the carotid bifurcations superiorly. The common carotid arteries and proximal vertebral arteries were not imaged and the right carotid bifurcation was incompletely imaged. Probably severe stenosis of the left ICA origin with preserved flow related signal in the more distal ICA. The right common carotid bifurcation is incompletely imaged with possible stenosis versus artifact. The visualized vertebral arteries are patent without visible high-grade stenosis. IMPRESSION: MRI head: Numerous small supratentorial and infratentorial infarcts, detailed above and potentially embolic and/or watershed in etiology given distribution. Associated edema without mass effect. MRA head: 1. Motion limited without large vessel occlusion. 2. 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 3. Probably moderate left M1 MCA stenosis. MRA neck: 1. Unfortunately, the common carotid arteries and vertebral arteries inferior to  the carotid bifurcation were not imaged. 2. Probably severe stenosis of the left ICA origin with preserved flow related signal in the more distal ICA. Incompletely imaged right carotid bifurcation with possible stenosis versus artifact. 3. Given the above findings and limitations, recommend CTA of the neck to fully evaluate the neck vasculature and to quantify ICA stenosis. These results will be called to the  ordering clinician or representative by the Radiologist Assistant, and communication documented in the PACS or [employer]. Electronically Signed: By: Margaretha Sheffield M.D. On: 01/31/2021 16:33   MR ANGIO NECK WO CONTRAST  Addendum Date: 01/31/2021   ADDENDUM REPORT: 01/31/2021 16:47 Electronically Signed   By: Margaretha Sheffield M.D.   On: 01/31/2021 16:47   Result Date: 01/31/2021 CLINICAL DATA:  Neuro deficit, acute, stroke suspected EXAM: MRI HEAD WITHOUT CONTRAST MRA HEAD WITHOUT CONTRAST MRA NECK WITHOUT CONTRAST TECHNIQUE: Multiplanar, multiecho pulse sequences of the brain and surrounding structures were obtained without intravenous contrast. Angiographic images of the Circle of Willis were obtained using MRA technique without intravenous contrast. Angiographic images of the neck were obtained using MRA technique without intravenous contrast. Carotid stenosis measurements (when applicable) are obtained utilizing NASCET criteria, using the distal internal carotid diameter as the denominator. COMPARISON:  None. FINDINGS: MRI HEAD FINDINGS Brain: Numerous small acute infarcts in bilateral cerebellar hemispheres, left middle cerebellar peduncle, pons (punctate), frontal, parietal, and occipital lobes and posterior temporal lobes. Supratentorial infarcts involve cortex and white matter. Also, small acute infarcts in bilateral thalami and basal ganglia. Associated edema without mass effect. Generalized atrophy. Chronic microvascular ischemic disease. No acute hemorrhage, hydrocephalus, mass  lesion, midline shift, or extra-axial fluid collection. Vascular: See below. Skull and upper cervical spine: Normal marrow signal. Sinuses/Orbits: Mild paranasal sinus mucosal thickening. Unremarkable orbits. Other: No mastoid effusions. MRA HEAD FINDINGS Motion limited study. Anterior circulation: Atherosclerotic narrowing of the intracranial ICAs without evidence of high-grade stenosis. Bilateral MCAs and ACAs are patent. Probably moderate left M1 MCA stenosis. Posterior circulation: Right dominant. 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 Bilateral PCAs are patent without high-grade stenosis. Left posterior communicating artery. MRA NECK FINDINGS Unfortunately, the neck vasculature was only imaged from at approximate level of the carotid bifurcations superiorly. The common carotid arteries and proximal vertebral arteries were not imaged and the right carotid bifurcation was incompletely imaged. Probably severe stenosis of the left ICA origin with preserved flow related signal in the more distal ICA. The right common carotid bifurcation is incompletely imaged with possible stenosis versus artifact. The visualized vertebral arteries are patent without visible high-grade stenosis. IMPRESSION: MRI head: Numerous small supratentorial and infratentorial infarcts, detailed above and potentially embolic and/or watershed in etiology given distribution. Associated edema without mass effect. MRA head: 1. Motion limited without large vessel occlusion. 2. 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 3. Probably moderate left M1 MCA stenosis. MRA neck: 1. Unfortunately, the common carotid arteries and vertebral arteries inferior to the carotid bifurcation were not imaged. 2. Probably severe stenosis of the left ICA origin with preserved flow related signal in the more distal ICA. Incompletely imaged right carotid bifurcation with possible stenosis versus  artifact. 3. Given the above findings and limitations, recommend CTA of the neck to fully evaluate the neck vasculature and to quantify ICA stenosis. These results will be called to the ordering clinician or representative by the Radiologist Assistant, and communication documented in the PACS or [employer]. Electronically Signed: By: Margaretha Sheffield M.D. On: 01/31/2021 16:33   MR BRAIN WO CONTRAST  Addendum Date: 01/31/2021   ADDENDUM REPORT: 01/31/2021 16:47 Electronically Signed   By: Margaretha Sheffield M.D.   On: 01/31/2021 16:47   Result Date: 01/31/2021 CLINICAL DATA:  Neuro deficit, acute, stroke suspected EXAM: MRI HEAD WITHOUT CONTRAST MRA HEAD WITHOUT CONTRAST MRA NECK WITHOUT CONTRAST TECHNIQUE: Multiplanar, multiecho pulse sequences of the brain and  surrounding structures were obtained without intravenous contrast. Angiographic images of the Circle of Willis were obtained using MRA technique without intravenous contrast. Angiographic images of the neck were obtained using MRA technique without intravenous contrast. Carotid stenosis measurements (when applicable) are obtained utilizing NASCET criteria, using the distal internal carotid diameter as the denominator. COMPARISON:  None. FINDINGS: MRI HEAD FINDINGS Brain: Numerous small acute infarcts in bilateral cerebellar hemispheres, left middle cerebellar peduncle, pons (punctate), frontal, parietal, and occipital lobes and posterior temporal lobes. Supratentorial infarcts involve cortex and white matter. Also, small acute infarcts in bilateral thalami and basal ganglia. Associated edema without mass effect. Generalized atrophy. Chronic microvascular ischemic disease. No acute hemorrhage, hydrocephalus, mass lesion, midline shift, or extra-axial fluid collection. Vascular: See below. Skull and upper cervical spine: Normal marrow signal. Sinuses/Orbits: Mild paranasal sinus mucosal thickening. Unremarkable orbits. Other: No mastoid effusions.  MRA HEAD FINDINGS Motion limited study. Anterior circulation: Atherosclerotic narrowing of the intracranial ICAs without evidence of high-grade stenosis. Bilateral MCAs and ACAs are patent. Probably moderate left M1 MCA stenosis. Posterior circulation: Right dominant. 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 Bilateral PCAs are patent without high-grade stenosis. Left posterior communicating artery. MRA NECK FINDINGS Unfortunately, the neck vasculature was only imaged from at approximate level of the carotid bifurcations superiorly. The common carotid arteries and proximal vertebral arteries were not imaged and the right carotid bifurcation was incompletely imaged. Probably severe stenosis of the left ICA origin with preserved flow related signal in the more distal ICA. The right common carotid bifurcation is incompletely imaged with possible stenosis versus artifact. The visualized vertebral arteries are patent without visible high-grade stenosis. IMPRESSION: MRI head: Numerous small supratentorial and infratentorial infarcts, detailed above and potentially embolic and/or watershed in etiology given distribution. Associated edema without mass effect. MRA head: 1. Motion limited without large vessel occlusion. 2. 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 3. Probably moderate left M1 MCA stenosis. MRA neck: 1. Unfortunately, the common carotid arteries and vertebral arteries inferior to the carotid bifurcation were not imaged. 2. Probably severe stenosis of the left ICA origin with preserved flow related signal in the more distal ICA. Incompletely imaged right carotid bifurcation with possible stenosis versus artifact. 3. Given the above findings and limitations, recommend CTA of the neck to fully evaluate the neck vasculature and to quantify ICA stenosis. These results will be called to the ordering clinician or representative by the Radiologist  Assistant, and communication documented in the PACS or [employer]. Electronically Signed: By: Feliberto Harts M.D. On: 01/31/2021 16:33   CARDIAC CATHETERIZATION  Result Date: 02/08/2021 Conclusions: Severe native coronary artery disease that is very similar to prior diagnostic catheterization from 06/2019.  This includes i95% proximal LAD stenosis, chronic total occlusion of the proximal LCx, and sequential 70% and 30% mid RCA stenoses. Patent LIMA-LAD with 20% mid graft stenosis. Patent SVG-D2 with 30% proximal graft disease. Chronically occluded SVG-OM2. Severely elevated left ventricular filling pressure (LVEDP ~35 mmHg). Recommendations: No obvious culprit for patient acute angina and subtle inferior ST elevation.  I suspect his symptoms and EKG findings may be due to acute on chronic HFrEF complicated by worsening mitral regurgitation noted on bedside echocardiogram this evening. Wean norepinephrine as tolerated. Establish central venous access in ICU and obtain central venous O2 saturation.  Consider adding milrinone if SvO2 < 70%. Aggressive diuresis. Yvonne Kendall, MD [facility]  US RENAL  Result Date: 01/31/2021 CLINICAL DATA:  Acute renal failure. EXAM: RENAL /  URINARY TRACT ULTRASOUND COMPLETE COMPARISON:  June 30, 2015 FINDINGS: Right Kidney: Renal measurements: 11.1 cm x 4.3 cm x 5.0 cm = volume: 124 mL. Echogenicity within normal limits. No mass or hydronephrosis visualized. Left Kidney: Renal measurements: 10.7 cm x 4.4 cm x 3.8 cm = volume: 94 mL. Echogenicity within normal limits. No mass or hydronephrosis visualized. Bladder: A Foley catheter is in place. Other: None. IMPRESSION: Normal renal ultrasound. Electronically Signed   By: Virgina Norfolk M.D.   On: 01/31/2021 14:57   DG Hand 2 View Right  Result Date: 01/31/2021 CLINICAL DATA:  MRI clearance EXAM: RIGHT HAND - 2 VIEW COMPARISON:  None. FINDINGS: There is no evidence of fracture or dislocation. Mild  degenerative changes of the thumb IP joint. Vascular calcifications. IMPRESSION: No metallic foreign bodies identified. Electronically Signed   By: Yetta Glassman M.D.   On: 01/31/2021 14:34   US Venous Img Lower Bilateral (DVT)  Result Date: 01/30/2021 CLINICAL DATA:  Bilateral lower extremity edema.  Evaluate for DVT. EXAM: BILATERAL LOWER EXTREMITY VENOUS DOPPLER ULTRASOUND TECHNIQUE: Gray-scale sonography with graded compression, as well as color Doppler and duplex ultrasound were performed to evaluate the lower extremity deep venous systems from the level of the common femoral vein and including the common femoral, femoral, profunda femoral, popliteal and calf veins including the posterior tibial, peroneal and gastrocnemius veins when visible. The superficial great saphenous vein was also interrogated. Spectral Doppler was utilized to evaluate flow at rest and with distal augmentation maneuvers in the common femoral, femoral and popliteal veins. COMPARISON:  None. FINDINGS: RIGHT LOWER EXTREMITY Common Femoral Vein: No evidence of thrombus. Normal compressibility, respiratory phasicity and response to augmentation. Saphenofemoral Junction: No evidence of thrombus. Normal compressibility and flow on color Doppler imaging. Profunda Femoral Vein: No evidence of thrombus. Normal compressibility and flow on color Doppler imaging. Femoral Vein: No evidence of thrombus. Normal compressibility, respiratory phasicity and response to augmentation. Popliteal Vein: No evidence of thrombus. Normal compressibility, respiratory phasicity and response to augmentation. Calf Veins: No evidence of thrombus. Normal compressibility and flow on color Doppler imaging. Superficial Great Saphenous Vein: No evidence of thrombus. Normal compressibility. Venous Reflux:  None. Other Findings:  None. LEFT LOWER EXTREMITY Common Femoral Vein: No evidence of thrombus. Normal compressibility, respiratory phasicity and response to  augmentation. Saphenofemoral Junction: No evidence of thrombus. Normal compressibility and flow on color Doppler imaging. Profunda Femoral Vein: No evidence of thrombus. Normal compressibility and flow on color Doppler imaging. Femoral Vein: No evidence of thrombus. Normal compressibility, respiratory phasicity and response to augmentation. Popliteal Vein: No evidence of thrombus. Normal compressibility, respiratory phasicity and response to augmentation. Calf Veins: No evidence of thrombus. Normal compressibility and flow on color Doppler imaging. Superficial Great Saphenous Vein: No evidence of thrombus. Normal compressibility. Venous Reflux:  None. Other Findings:  None. IMPRESSION: No evidence of DVT within either lower extremity. Electronically Signed   By: Sandi Mariscal M.D.   On: 01/30/2021 14:26   DG Chest Port 1 View  Result Date: 01/31/2021 CLINICAL DATA:  Acute respiratory failure with hypoxia EXAM: PORTABLE CHEST 1 VIEW COMPARISON:  Chest radiograph dated January 30, 2021 FINDINGS: Stable mild cardiomegaly. Diffuse mild interstitial opacities concerning for pulmonary edema, unchanged. No large pleural effusion. Endotracheal tube, feeding tube and right IJ access central line are unchanged. IMPRESSION: 1.  Stable cardiomegaly with mild pulmonary vascular congestion. 2.  Lines and tubes are unchanged. Electronically Signed   By: Keane Police D.O.   On: 01/31/2021 08:40  DG Chest Port 1 View  Result Date: 01/30/2021 CLINICAL DATA:  Acute respiratory failure EXAM: PORTABLE CHEST 1 VIEW COMPARISON:  Chest radiograph 01/16/2021 FINDINGS: The endotracheal tube is in stable position. A right IJ central venous catheter is in stable position. Median sternotomy wires are again noted. The heart is enlarged, unchanged. Prominent interstitial markings are again seen which suggest mild pulmonary interstitial edema. There is no new focal airspace disease. The right costophrenic angle is partially excluded,  there is no large right effusion. There is no significant left effusion. There is no pneumothorax. IMPRESSION: Overall no significant interval change in lung aeration with stable mild pulmonary interstitial edema. Electronically Signed   By: Valetta Mole M.D.   On: 01/30/2021 08:00   DG Chest Port 1 View  Result Date: 02/03/2021 CLINICAL DATA:  Central line placement EXAM: PORTABLE CHEST 1 VIEW COMPARISON:  01/20/2021 FINDINGS: Endotracheal tube is 3 cm above the carina. Right central line tip is at the cavoatrial junction. No pneumothorax. NG tube is in the stomach. Cardiomegaly. Bibasilar atelectasis. Interstitial prominence again noted may reflect interstitial edema. IMPRESSION: Right central line placement with the tip at the cavoatrial junction. No pneumothorax. Otherwise no change. Electronically Signed   By: Rolm Baptise M.D.   On: 01/31/2021 22:45   DG Chest Port 1 View  Result Date: 02/04/2021 CLINICAL DATA:  OG tube placed none. EXAM: PORTABLE CHEST 1 VIEW COMPARISON:  Radiograph earlier today FINDINGS: Tip and side port of the enteric tube are below the diaphragm in the stomach. Endotracheal tube tip just below the clavicular heads. Post median sternotomy. Stable cardiomegaly. Similar interstitial thickening suspicious for pulmonary edema, equivocal worsening on the right. No convincing pleural effusion. No pneumothorax. IMPRESSION: 1. Tip and side port of the enteric tube below the diaphragm in the stomach. Endotracheal tube tip just below the clavicular heads. 2. Stable cardiomegaly. Similar interstitial thickening suspicious for pulmonary edema, equivocal worsening on the right. Electronically Signed   By: Keith Rake M.D.   On: 01/15/2021 21:31   DG Chest Portable 1 View  Result Date: 01/18/2021 CLINICAL DATA:  Intubation, chest pain EXAM: PORTABLE CHEST 1 VIEW COMPARISON:  Chest radiograph 01/05/2020 FINDINGS: The endotracheal tube tip is approximately 3.5 cm from the carina.  Median sternotomy wires and mediastinal surgical clips are again seen. The heart is enlarged, unchanged. The upper mediastinal contours are within normal limits. There are increased interstitial opacities throughout both lungs. There is no focal consolidation. There is no pleural effusion or pneumothorax. There is no acute osseous abnormality. IMPRESSION: 1. Increased interstitial opacities throughout both lungs favored to reflect pulmonary interstitial edema with differential including viral/atypical infection. 2. Unchanged cardiomegaly. Electronically Signed   By: Valetta Mole M.D.   On: 01/27/2021 17:04   ECHOCARDIOGRAM COMPLETE  Result Date: 01/30/2021    ECHOCARDIOGRAM REPORT   Patient Name:   Benjamin Chan Date of Exam: 01/30/2021 Medical Rec #:  [id number]          Height:       66.0 in Accession #:    [id number]         Weight:       194.7 lb Date of Birth:  Dec 15, 1945          BSA:          1.977 m Patient Age:    75 years           BP:           100/65 mmHg  Patient Gender: M                  HR:           82 bpm. Exam Location:  [facility] Procedure: 2D Echo, Color Doppler, Cardiac Doppler and Intracardiac            Opacification Agent Indications:     I24.9 Acute ischemic heart disease, unspecified  History:         Patient has prior history of Echocardiogram examinations, most                  recent 07/15/2019. CAD and Previous Myocardial Infarction, CKD                  and COPD; Risk Factors:Diabetes.  Sonographer:     Humphrey Rolls Referring Phys:  [id number] CHRISTOPHER END Diagnosing Phys: Debbe Odea MD  Sonographer Comments: Echo performed with patient supine and on artificial respirator, suboptimal parasternal window and suboptimal subcostal window. IMPRESSIONS  1. Left ventricular ejection fraction, by estimation, is 25 to 30%. Left ventricular ejection fraction by 2D MOD biplane is 26.0 %. The left ventricle has severely decreased function. The left ventricle demonstrates global hypokinesis.  Left ventricular diastolic parameters are consistent with Grade II diastolic dysfunction (pseudonormalization).  2. Right ventricular systolic function is low normal. The right ventricular size is not well visualized.  3. Left atrial size was mildly dilated.  4. The mitral valve is grossly normal. Mild to moderate mitral valve regurgitation.  5. Tricuspid valve regurgitation is mild to moderate.  6. The aortic valve was not well visualized. Aortic valve regurgitation is not visualized. FINDINGS  Left Ventricle: Left ventricular ejection fraction, by estimation, is 25 to 30%. Left ventricular ejection fraction by 2D MOD biplane is 26.0 %. The left ventricle has severely decreased function. The left ventricle demonstrates global hypokinesis. Definity contrast agent was given IV to delineate the left ventricular endocardial borders. The left ventricular internal cavity size was normal in size. There is no left ventricular hypertrophy. Left ventricular diastolic parameters are consistent with Grade II diastolic dysfunction (pseudonormalization). Right Ventricle: The right ventricular size is not well visualized. No increase in right ventricular wall thickness. Right ventricular systolic function is low normal. Left Atrium: Left atrial size was mildly dilated. Right Atrium: Right atrial size was normal in size. Pericardium: There is no evidence of pericardial effusion. Mitral Valve: The mitral valve is grossly normal. Mild to moderate mitral valve regurgitation. MV peak gradient, 4.4 mmHg. The mean mitral valve gradient is 2.0 mmHg. Tricuspid Valve: The tricuspid valve is grossly normal. Tricuspid valve regurgitation is mild to moderate. Aortic Valve: The aortic valve was not well visualized. Aortic valve regurgitation is not visualized. Aortic valve mean gradient measures 9.0 mmHg. Aortic valve peak gradient measures 15.7 mmHg. Aortic valve area, by VTI measures 1.90 cm. Pulmonic Valve: The pulmonic valve was not well  visualized. Pulmonic valve regurgitation is not visualized. Aorta: The aortic root is normal in size and structure. Venous: IVC assessment for right atrial pressure unable to be performed due to mechanical ventilation. IAS/Shunts: No atrial level shunt detected by color flow Doppler.  LEFT VENTRICLE PLAX 2D                        Biplane EF (MOD) LVOT diam:     2.30 cm         LV Biplane EF:   Left LV SV:  49                               ventricular LV SV Index:   25                               ejection LVOT Area:     4.15 cm                         fraction by                                                 2D MOD                                                 biplane is LV Volumes (MOD)                                26.0 %. LV vol d, MOD    178.0 ml A2C:                           Diastology LV vol d, MOD    204.0 ml      LV e' medial:    4.90 cm/s A4C:                           LV E/e' medial:  21.8 LV vol s, MOD    135.0 ml      LV e' lateral:   5.87 cm/s A2C:                           LV E/e' lateral: 18.2 LV vol s, MOD    155.0 ml A4C: LV SV MOD A2C:   43.0 ml LV SV MOD A4C:   204.0 ml LV SV MOD BP:    50.6 ml RIGHT VENTRICLE RV Basal diam:  3.10 cm RV S prime:     9.57 cm/s LEFT ATRIUM             Index        RIGHT ATRIUM           Index LA Vol (A2C):   86.4 ml 43.70 ml/m  RA Area:     20.00 cm LA Vol (A4C):   61.5 ml 31.11 ml/m  RA Volume:   55.00 ml  27.82 ml/m LA Biplane Vol: 73.1 ml 36.98 ml/m  AORTIC VALVE AV Area (Vmax):    1.92 cm AV Area (Vmean):   1.75 cm AV Area (VTI):     1.90 cm AV Vmax:           198.00 cm/s AV Vmean:          136.000 cm/s AV VTI:            0.258 m AV Peak Grad:      15.7 mmHg AV Mean Grad:  9.0 mmHg LVOT Vmax:         91.40 cm/s LVOT Vmean:        57.300 cm/s LVOT VTI:          0.118 m LVOT/AV VTI ratio: 0.46  AORTA Ao Root diam: 3.20 cm MITRAL VALVE MV Area (PHT): 4.26 cm     SHUNTS MV Area VTI:   1.79 cm     Systemic VTI:  0.12 m MV Peak grad:  4.4  mmHg     Systemic Diam: 2.30 cm MV Mean grad:  2.0 mmHg MV Vmax:       1.05 m/s MV Vmean:      58.5 cm/s MV Decel Time: 178 msec MV E velocity: 107.00 cm/s MV A velocity: 75.40 cm/s MV E/A ratio:  1.42 Kate Sable MD Electronically signed by Kate Sable MD Signature Date/Time: 01/30/2021/2:11:13 PM    Final    ECHOCARDIOGRAM LIMITED  Result Date: 02-28-21    ECHOCARDIOGRAM LIMITED REPORT   Patient Name:   Benjamin Chan Date of Exam: February 28, 2021 Medical Rec #:  [id number]          Height:       66.0 in Accession #:    [id number]         Weight:       198.4 lb Date of Birth:  1945/12/22          BSA:          1.993 m Patient Age:    65 years           BP:           112/62 mmHg Patient Gender: M                  HR:           93 bpm. Exam Location:  [facility] Procedure: Limited Echo, Cardiac Doppler and Color Doppler Indications:     Stroke I63.9  History:         Patient has prior history of Echocardiogram examinations, most                  recent 01/30/2021. CAD and Previous Myocardial Infarction,                  COPD; Risk Factors:Diabetes.  Sonographer:     Sherrie Sport Referring Phys:  [place] Diagnosing Phys: Donnelly Angelica  Sonographer Comments: Technically difficult study due to poor echo windows, echo performed with patient supine and on artificial respirator and no apical window. Image acquisition challenging due to COPD. IMPRESSIONS  1. Left ventricle is poorly visualized. Findings in inferior wall could be suggestive of pseudoaneursy and/or contained rupture. Left ventricular ejection fraction, by estimation, is 25 to 30%. The left ventricle has severely decreased function. The left ventricle demonstrates global hypokinesis. The left ventricular internal cavity size was moderately dilated.  2. Right ventricular systolic function is normal. The right ventricular size is normal.  3. The mitral valve is normal in structure. Mild mitral valve regurgitation.  4. Tricuspid valve  regurgitation is moderate.  5. The aortic valve is calcified. FINDINGS  Left Ventricle: Left ventricle is poorly visualized. Findings in inferior wall could be suggestive of pseudoaneursy and/or contained rupture. Left ventricular ejection fraction, by estimation, is 25 to 30%. The left ventricle has severely decreased function. The left ventricle demonstrates global hypokinesis. The left ventricular internal cavity size was moderately dilated. Right Ventricle: The right ventricular size  is normal. Right ventricular systolic function is normal. Mitral Valve: The mitral valve is normal in structure. Mild mitral valve regurgitation. Tricuspid Valve: The tricuspid valve is normal in structure. Tricuspid valve regurgitation is moderate. Aortic Valve: The aortic valve is calcified. LEFT VENTRICLE PLAX 2D LVIDd:         7.00 cm LVIDs:         6.30 cm LV PW:         2.00 cm LV IVS:        1.20 cm  LEFT ATRIUM         Index LA diam:    4.80 cm 2.41 cm/m   AORTA Ao Root diam: 3.10 cm TRICUSPID VALVE TR Peak grad:   40.4 mmHg TR Vmax:        318.00 cm/s Donnelly Angelica Electronically signed by Donnelly Angelica Signature Date/Time: 02/13/2021/7:07:22 PM    Final     Microbiology Recent Results (from the past 240 hour(s))  Resp Panel by RT-PCR (Flu A&B, Covid)     Status: None   Collection Time: 02/10/2021  4:21 PM   Specimen: Nasopharyngeal(NP) swabs in vial transport medium  Result Value Ref Range Status   SARS Coronavirus 2 by RT PCR NEGATIVE NEGATIVE Final    Comment: (NOTE) SARS-CoV-2 target nucleic acids are NOT DETECTED.  The SARS-CoV-2 RNA is generally detectable in upper respiratory specimens during the acute phase of infection. The lowest concentration of SARS-CoV-2 viral copies this assay can detect is 138 copies/mL. A negative result does not preclude SARS-Cov-2 infection and should not be used as the sole basis for treatment or other patient management decisions. A negative result may occur with  improper  specimen collection/handling, submission of specimen other than nasopharyngeal swab, presence of viral mutation(s) within the areas targeted by this assay, and inadequate number of viral copies(<138 copies/mL). A negative result must be combined with clinical observations, patient history, and epidemiological information. The expected result is Negative.  Fact Sheet for Patients:  EntrepreneurPulse.com.au  Fact Sheet for Healthcare Providers:  IncredibleEmployment.be  This test is no t yet approved or cleared by the [place] FDA and  has been authorized for detection and/or diagnosis of SARS-CoV-2 by FDA under an Emergency Use Authorization (EUA). This EUA will remain  in effect (meaning this test can be used) for the duration of the COVID-19 declaration under Section 564(b)(1) of the Act, 21 U.S.C.section 360bbb-3(b)(1), unless the authorization is terminated  or revoked sooner.       Influenza A by PCR NEGATIVE NEGATIVE Final   Influenza B by PCR NEGATIVE NEGATIVE Final    Comment: (NOTE) The Xpert Xpress SARS-CoV-2/FLU/RSV plus assay is intended as an aid in the diagnosis of influenza from Nasopharyngeal swab specimens and should not be used as a sole basis for treatment. Nasal washings and aspirates are unacceptable for Xpert Xpress SARS-CoV-2/FLU/RSV testing.  Fact Sheet for Patients: EntrepreneurPulse.com.au  Fact Sheet for Healthcare Providers: IncredibleEmployment.be  This test is not yet approved or cleared by the [place] FDA and has been authorized for detection and/or diagnosis of SARS-CoV-2 by FDA under an Emergency Use Authorization (EUA). This EUA will remain in effect (meaning this test can be used) for the duration of the COVID-19 declaration under Section 564(b)(1) of the Act, 21 U.S.C. section 360bbb-3(b)(1), unless the authorization is terminated or revoked.  Performed at  [facility], [address] [place], [place] [address]   Blood culture (single)     Status: None (Preliminary  result)   Collection Time: 02/05/2021  6:14 PM   Specimen: BLOOD  Result Value Ref Range Status   Specimen Description BLOOD BLOOD LEFT HAND  Final   Special Requests   Final    BOTTLES DRAWN AEROBIC AND ANAEROBIC Blood Culture adequate volume   Culture   Final    NO GROWTH 3 DAYS Performed at [facility], [address] [place], [place] [address]    Report Status PENDING  Incomplete  MRSA Next Gen by PCR, Nasal     Status: None   Collection Time: 02/13/2021 10:53 PM   Specimen: Nasal Mucosa; Nasal Swab  Result Value Ref Range Status   MRSA by PCR Next Gen NOT DETECTED NOT DETECTED Final    Comment: (NOTE) The GeneXpert MRSA Assay (FDA approved for NASAL specimens only), is one component of a comprehensive MRSA colonization surveillance program. It is not intended to diagnose MRSA infection nor to guide or monitor treatment for MRSA infections. Test performance is not FDA approved in patients less than 5 years old. Performed at [facility], [address] [place], [place] [address]   CULTURE, BLOOD (ROUTINE X 2) w Reflex to ID Panel     Status: None (Preliminary result)   Collection Time: 01/30/21 10:39 AM   Specimen: BLOOD  Result Value Ref Range Status   Specimen Description BLOOD BLOOD LEFT WRIST  Final   Special Requests   Final    BOTTLES DRAWN AEROBIC AND ANAEROBIC Blood Culture adequate volume   Culture   Final    NO GROWTH 2 DAYS Performed at [facility], [address] [place], [place] [address]    Report Status PENDING  Incomplete  CULTURE, BLOOD (ROUTINE X 2) w Reflex to ID Panel     Status: Abnormal   Collection Time: 01/30/21 11:43 AM   Specimen: BLOOD  Result Value Ref Range Status   Specimen Description   Final    BLOOD BLOOD LEFT WRIST Performed at [facility], [address]  [place], [place] [address]    Special Requests   Final    BOTTLES DRAWN AEROBIC AND ANAEROBIC Blood Culture adequate volume Performed at [facility], [address] [place], [place] [address]    Culture  Setup Time   Final    AEROBIC BOTTLE ONLY GRAM POSITIVE COCCI CRITICAL RESULT CALLED TO, READ BACK BY AND VERIFIED WITH: ALEX CHAPPELL @ [id number] ON 01/31/21.Marland KitchenMarland KitchenGM    Culture (A)  Final    STAPHYLOCOCCUS EPIDERMIDIS THE SIGNIFICANCE OF ISOLATING THIS ORGANISM FROM A SINGLE SET OF BLOOD CULTURES WHEN MULTIPLE SETS ARE DRAWN IS UNCERTAIN. PLEASE NOTIFY THE MICROBIOLOGY DEPARTMENT WITHIN ONE WEEK IF SPECIATION AND SENSITIVITIES ARE REQUIRED. Performed at [facility], [place] [address], [place],  [address]    Report Status 02/17/21 FINAL  Final  Blood Culture ID Panel (Reflexed)     Status: Abnormal   Collection Time: 01/30/21 11:43 AM  Result Value Ref Range Status   Enterococcus faecalis NOT DETECTED NOT DETECTED Final   Enterococcus Faecium NOT DETECTED NOT DETECTED Final   Listeria monocytogenes NOT DETECTED NOT DETECTED Final   Staphylococcus species DETECTED (A) NOT DETECTED Final    Comment: CRITICAL RESULT CALLED TO, READ BACK BY AND VERIFIED WITH: ALEX CHAPPELL @ [id number] ON 01/31/21.Marland KitchenMarland KitchenGM    Staphylococcus aureus (BCID) NOT DETECTED NOT DETECTED Final   Staphylococcus epidermidis DETECTED (A) NOT DETECTED Final    Comment: Methicillin (oxacillin) resistant coagulase negative staphylococcus. Possible  blood culture contaminant (unless isolated from more than one blood culture draw or clinical case suggests pathogenicity). No antibiotic treatment is indicated for blood  culture contaminants. CRITICAL RESULT CALLED TO, READ BACK BY AND VERIFIED WITH: ALEX CHAPPELL @ [id number] ON 01/31/21.Marland KitchenMarland KitchenGM    Staphylococcus lugdunensis NOT DETECTED NOT DETECTED Final   Streptococcus species NOT DETECTED NOT DETECTED Final   Streptococcus agalactiae NOT DETECTED NOT DETECTED Final    Streptococcus pneumoniae NOT DETECTED NOT DETECTED Final   Streptococcus pyogenes NOT DETECTED NOT DETECTED Final   A.calcoaceticus-baumannii NOT DETECTED NOT DETECTED Final   Bacteroides fragilis NOT DETECTED NOT DETECTED Final   Enterobacterales NOT DETECTED NOT DETECTED Final   Enterobacter cloacae complex NOT DETECTED NOT DETECTED Final   Escherichia coli NOT DETECTED NOT DETECTED Final   Klebsiella aerogenes NOT DETECTED NOT DETECTED Final   Klebsiella oxytoca NOT DETECTED NOT DETECTED Final   Klebsiella pneumoniae NOT DETECTED NOT DETECTED Final   Proteus species NOT DETECTED NOT DETECTED Final   Salmonella species NOT DETECTED NOT DETECTED Final   Serratia marcescens NOT DETECTED NOT DETECTED Final   Haemophilus influenzae NOT DETECTED NOT DETECTED Final   Neisseria meningitidis NOT DETECTED NOT DETECTED Final   Pseudomonas aeruginosa NOT DETECTED NOT DETECTED Final   Stenotrophomonas maltophilia NOT DETECTED NOT DETECTED Final   Candida albicans NOT DETECTED NOT DETECTED Final   Candida auris NOT DETECTED NOT DETECTED Final   Candida glabrata NOT DETECTED NOT DETECTED Final   Candida krusei NOT DETECTED NOT DETECTED Final   Candida parapsilosis NOT DETECTED NOT DETECTED Final   Candida tropicalis NOT DETECTED NOT DETECTED Final   Cryptococcus neoformans/gattii NOT DETECTED NOT DETECTED Final   Methicillin resistance mecA/C DETECTED (A) NOT DETECTED Final    Comment: CRITICAL RESULT CALLED TO, READ BACK BY AND VERIFIED WITH: ALEX CHAPPELL @ [id number] ON 01/31/21.Marland KitchenMarland KitchenGM Performed at [facility], [address] [place], [place] [address]   Culture, Respiratory w Gram Stain     Status: None   Collection Time: 01/30/21 12:07 PM   Specimen: Tracheal Aspirate; Respiratory  Result Value Ref Range Status   Specimen Description   Final    TRACHEAL ASPIRATE Performed at [facility], [address] [place], [place] [address]    Special Requests   Final     NONE Performed at [facility], [address] [place], [place] [address]    Gram Stain   Final    FEW SQUAMOUS EPITHELIAL CELLS PRESENT MODERATE WBC SEEN FEW GRAM POSITIVE RODS MODERATE GRAM POSITIVE COCCI    Culture   Final    ABUNDANT Normal respiratory flora-no Staph aureus or Pseudomonas seen Performed at [facility], [place] [address] [place], [place] [address]    Report Status Feb 24, 2021 FINAL  Final    Lab Basic Metabolic Panel: Recent Labs  Lab 01/15/2021 1640 01/30/21 0412 01/31/21 0328 01/31/21 1127 01/31/21 1705 Feb 24, 2021 0343  NA 136 134* 137  --   --  137  K 3.3* 5.2* 4.7  --   --  4.5  CL 104 103 103  --   --  105  CO2 22 23 23   --   --  23  GLUCOSE 192* 302* 236*  --   --  273*  BUN 32* 38* 49*  --   --  74*  CREATININE 1.91* 2.64* 2.98*  --   --  2.75*  CALCIUM 8.6* 8.0* 8.2*  --   --  8.5*  MG 2.5*  2.4  --  2.4 2.4 2.3  PHOS  --  7.2*  --  4.0 4.6 3.8   Liver Function Tests: Recent Labs  Lab 01/30/2021 1640  AST 31  ALT 24  ALKPHOS 82  BILITOT 0.9  PROT 7.8  ALBUMIN 4.2   No results for input(s): LIPASE, AMYLASE in the last 168 hours. No results for input(s): AMMONIA in the last 168 hours. CBC: Recent Labs  Lab 01/28/2021 1640 01/30/21 0412 01/31/21 0328 Feb 02, 2021 0343  WBC 13.9* 18.4* 18.4* 17.5*  NEUTROABS 8.2*  --   --   --   HGB 14.7 13.7 13.3 12.9*  HCT 44.2 41.6 40.7 40.3  MCV 91.9 92.4 95.1 93.5  PLT 284 270 218 200   Cardiac Enzymes: No results for input(s): CKTOTAL, CKMB, CKMBINDEX, TROPONINI in the last 168 hours. Sepsis Labs: Recent Labs  Lab 02/03/2021 1640 02/12/2021 2233 01/30/21 0412 01/31/21 0328 02-02-21 0343  PROCALCITON  --  0.65 1.44 1.85 1.44  WBC 13.9*  --  18.4* 18.4* 17.5*  LATICACIDVEN 3.7* 2.2* 1.8  --   --     Procedures/Operations  01/20/2021- ETT placement 01/18/2021- CVC RIJ placement 01/15/2021- LHC 01/30/21- Arterial line placement (R femoral)   Britton L Rust-Chester February 02, 2021,  8:28 PM  Domingo Pulse Rust-Chester, AGACNP-BC Acute Care Nurse Practitioner [facility]   [phone] / [phone] Please see Amion for pager details.

## 2021-02-14 NOTE — Progress Notes (Signed)
PT Cancellation Note  Patient Details Name: CORIAN HANDLEY MRN: [id number] DOB: 22-Feb-1946   Cancelled Treatment:    Reason Eval/Treat Not Completed: Patient not medically ready;Medical issues which prohibited therapy. Pt has had a significant decline and is not medically appropriate for PT. Confirmed with RN to discontinue orders. Please re-consult if pt status changes.    Basilia Jumbo PT, DPT 01/30/2021 9:22 AM [phone]

## 2021-02-14 NOTE — Progress Notes (Signed)
Pt unresponsive this shift, only has gag reflex. Pt was sedated this AM but unable to wean due to HR increase and RR increase. Pt made comfort care. Morphine gtt started and boluses administered for comfort. Family at bedside.

## 2021-02-14 NOTE — Telephone Encounter (Signed)
Spoke to funeral home and confirmed death certificate needs to be assigned to [place] Jetty, NP

## 2021-02-14 NOTE — Progress Notes (Signed)
[facility]  ROUNDING NOTE   Subjective:   [name] was admitted to [facility] on 01/26/2021 for Cardiogenic shock ([place]) [R57.0] Acute respiratory failure with hypoxia ([place]) [J96.01] Unresponsive state [R41.89] ST elevation myocardial infarction (STEMI) involving other coronary artery of inferior wall (HCC) [I21.19] ST elevation myocardial infarction (STEMI), unspecified artery ([place]) [I21.3]  Family at bedside.   UOP 1966m.  Creatinine 2.75 (2.98)  Objective:  Vital signs in last 24 hours:  Temp:  [99 F (37.2 C)-101.8 F (38.8 C)] 101.8 F (38.8 C) (11/18 0800) Pulse Rate:  [90-121] 97 (11/18 1030) Resp:  [22-41] 35 (11/18 1030) BP: (86-125)/(58-81) 111/65 (11/18 1030) SpO2:  [90 %-100 %] 95 % (11/18 1030) Arterial Line BP: (76-126)/(57-80) 112/67 (11/18 1030) FiO2 (%):  [40 %] 40 % (11/18 0737) Weight:  [90 kg] 90 kg (11/18 0145)  Weight change: -0.2 kg Filed Weights   02/10/2021 1743 01/31/21 0317 111/29/220145  Weight: 88.3 kg 90.2 kg 90 kg    Intake/Output: I/O last 3 completed shifts: In: 3417.5 [I.V.:1871.5; NG/GT:1446; IV Piggyback:100] Out: 2600 [Urine:2600]   Intake/Output this shift:  Total I/O In: 1000.6 [I.V.:83.3; NG/GT:917.3] Out: 400 [Urine:400]  Physical Exam: General: Critically ill  Head: ETT  Eyes: Anicteric, PERRL  Neck: trachea midline  Lungs:  PRVC FiO2 40%  Heart: Regular rate and rhythm  Abdomen:  Soft, nontender, obese  Extremities:  no peripheral edema.  Neurologic: Intubated and sedated.   Skin: No lesions  Access: none    Basic Metabolic Panel: Recent Labs  Lab 01/16/2021 1640 01/30/21 0412 01/31/21 0328 01/31/21 1127 01/31/21 1705 [id number]  NA 136 134* 137  --   --  137  K 3.3* 5.2* 4.7  --   --  4.5  CL 104 103 103  --   --  105  CO2 _0 --   --  23  GLUCOSE 192* 302* 236*  --   --  273*  BUN 32* 38* 49*  --   --  74*  CREATININE 1.91* 2.64* 2.98*  --   --  2.75*  CALCIUM 8.6*  8.0* 8.2*  --   --  8.5*  MG 2.5* 2.4  --  2.4 2.4 2.3  PHOS  --  7.2*  --  4.0 4.6 3.8     Liver Function Tests: Recent Labs  Lab 01/21/2021 1640  AST 31  ALT 24  ALKPHOS 82  BILITOT 0.9  PROT 7.8  ALBUMIN 4.2    No results for input(s): LIPASE, AMYLASE in the last 168 hours. No results for input(s): AMMONIA in the last 168 hours.  CBC: Recent Labs  Lab 02/07/2021 1640 01/30/21 0412 01/31/21 0328 [id number]  WBC 13.9* 18.4* 18.4* 17.5*  NEUTROABS 8.2*  --   --   --   HGB 14.7 13.7 13.3 12.9*  HCT 44.2 41.6 40.7 40.3  MCV 91.9 92.4 95.1 93.5  PLT 284 270 218 200     Cardiac Enzymes: No results for input(s): CKTOTAL, CKMB, CKMBINDEX, TROPONINI in the last 168 hours.  BNP: Invalid input(s): POCBNP  CBG: Recent Labs  Lab 01/31/21 1606 01/31/21 1933 01/31/21 2327 [id number] [id number]/11/[id number]  GLUCAP 179* 191* 222* 222* 231     Microbiology: Results for orders placed or performed during the hospital encounter of 01/20/2021  Resp Panel by RT-PCR (Flu A&B, Covid)     Status: None   Collection Time: 02/12/2021  4:21 PM   Specimen: Nasopharyngeal(NP) swabs  in vial transport medium  Result Value Ref Range Status   SARS Coronavirus 2 by RT PCR NEGATIVE NEGATIVE Final    Comment: (NOTE) SARS-CoV-2 target nucleic acids are NOT DETECTED.  The SARS-CoV-2 RNA is generally detectable in upper respiratory specimens during the acute phase of infection. The lowest concentration of SARS-CoV-2 viral copies this assay can detect is 138 copies/mL. A negative result does not preclude SARS-Cov-2 infection and should not be used as the sole basis for treatment or other patient management decisions. A negative result may occur with  improper specimen collection/handling, submission of specimen other than nasopharyngeal swab, presence of viral mutation(s) within the areas targeted by this assay, and inadequate number of viral copies(<138 copies/mL). A negative result must be  combined with clinical observations, patient history, and epidemiological information. The expected result is Negative.  Fact Sheet for Patients:  EntrepreneurPulse.com.au  Fact Sheet for Healthcare Providers:  IncredibleEmployment.be  This test is no t yet approved or cleared by the [place] FDA and  has been authorized for detection and/or diagnosis of SARS-CoV-2 by FDA under an Emergency Use Authorization (EUA). This EUA will remain  in effect (meaning this test can be used) for the duration of the COVID-19 declaration under Section 564(b)(1) of the Act, 21 U.S.C.section 360bbb-3(b)(1), unless the authorization is terminated  or revoked sooner.       Influenza A by PCR NEGATIVE NEGATIVE Final   Influenza B by PCR NEGATIVE NEGATIVE Final    Comment: (NOTE) The Xpert Xpress SARS-CoV-2/FLU/RSV plus assay is intended as an aid in the diagnosis of influenza from Nasopharyngeal swab specimens and should not be used as a sole basis for treatment. Nasal washings and aspirates are unacceptable for Xpert Xpress SARS-CoV-2/FLU/RSV testing.  Fact Sheet for Patients: EntrepreneurPulse.com.au  Fact Sheet for Healthcare Providers: IncredibleEmployment.be  This test is not yet approved or cleared by the [place] FDA and has been authorized for detection and/or diagnosis of SARS-CoV-2 by FDA under an Emergency Use Authorization (EUA). This EUA will remain in effect (meaning this test can be used) for the duration of the COVID-19 declaration under Section 564(b)(1) of the Act, 21 U.S.C. section 360bbb-3(b)(1), unless the authorization is terminated or revoked.  Performed at [facility], [address] [place], [place] [address]   Blood culture (single)     Status: None (Preliminary result)   Collection Time: 01/23/2021  6:14 PM   Specimen: BLOOD  Result Value Ref Range Status   Specimen  Description BLOOD BLOOD LEFT HAND  Final   Special Requests   Final    BOTTLES DRAWN AEROBIC AND ANAEROBIC Blood Culture adequate volume   Culture   Final    NO GROWTH 3 DAYS Performed at [facility], [address] [place], [place] [address]    Report Status PENDING  Incomplete  MRSA Next Gen by PCR, Nasal     Status: None   Collection Time: 02/12/2021 10:53 PM   Specimen: Nasal Mucosa; Nasal Swab  Result Value Ref Range Status   MRSA by PCR Next Gen NOT DETECTED NOT DETECTED Final    Comment: (NOTE) The GeneXpert MRSA Assay (FDA approved for NASAL specimens only), is one component of a comprehensive MRSA colonization surveillance program. It is not intended to diagnose MRSA infection nor to guide or monitor treatment for MRSA infections. Test performance is not FDA approved in patients less than 66 years old. Performed at [facility], [address] [place], [place] [address]  CULTURE, BLOOD (ROUTINE X 2) w Reflex to ID Panel     Status: None (Preliminary result)   Collection Time: 01/30/21 10:39 AM   Specimen: BLOOD  Result Value Ref Range Status   Specimen Description BLOOD BLOOD LEFT WRIST  Final   Special Requests   Final    BOTTLES DRAWN AEROBIC AND ANAEROBIC Blood Culture adequate volume   Culture   Final    NO GROWTH 2 DAYS Performed at [facility], [address], [place], [place] [address]    Report Status PENDING  Incomplete  CULTURE, BLOOD (ROUTINE X 2) w Reflex to ID Panel     Status: None (Preliminary result)   Collection Time: 01/30/21 11:43 AM   Specimen: BLOOD  Result Value Ref Range Status   Specimen Description   Final    BLOOD BLOOD LEFT WRIST Performed at [facility], [address], [place], [place] [address]    Special Requests   Final    BOTTLES DRAWN AEROBIC AND ANAEROBIC Blood Culture adequate volume Performed at [facility], [address] [place], [place] [address]    Culture  Setup  Time   Final    AEROBIC BOTTLE ONLY GRAM POSITIVE COCCI CRITICAL RESULT CALLED TO, READ BACK BY AND VERIFIED WITH: ALEX CHAPPELL @ 9147 ON 01/31/21.Marland KitchenMarland KitchenGM Performed at [facility], [place] [address] [place], [place] [address]    Culture GRAM POSITIVE COCCI  Final   Report Status PENDING  Incomplete  Blood Culture ID Panel (Reflexed)     Status: Abnormal   Collection Time: 01/30/21 11:43 AM  Result Value Ref Range Status   Enterococcus faecalis NOT DETECTED NOT DETECTED Final   Enterococcus Faecium NOT DETECTED NOT DETECTED Final   Listeria monocytogenes NOT DETECTED NOT DETECTED Final   Staphylococcus species DETECTED (A) NOT DETECTED Final    Comment: CRITICAL RESULT CALLED TO, READ BACK BY AND VERIFIED WITH: ALEX CHAPPELL @ 2130 ON 01/31/21.Marland KitchenMarland KitchenGM    Staphylococcus aureus (BCID) NOT DETECTED NOT DETECTED Final   Staphylococcus epidermidis DETECTED (A) NOT DETECTED Final    Comment: Methicillin (oxacillin) resistant coagulase negative staphylococcus. Possible blood culture contaminant (unless isolated from more than one blood culture draw or clinical case suggests pathogenicity). No antibiotic treatment is indicated for blood  culture contaminants. CRITICAL RESULT CALLED TO, READ BACK BY AND VERIFIED WITH: ALEX CHAPPELL @ 8657 ON 01/31/21.Marland KitchenMarland KitchenGM    Staphylococcus lugdunensis NOT DETECTED NOT DETECTED Final   Streptococcus species NOT DETECTED NOT DETECTED Final   Streptococcus agalactiae NOT DETECTED NOT DETECTED Final   Streptococcus pneumoniae NOT DETECTED NOT DETECTED Final   Streptococcus pyogenes NOT DETECTED NOT DETECTED Final   A.calcoaceticus-baumannii NOT DETECTED NOT DETECTED Final   Bacteroides fragilis NOT DETECTED NOT DETECTED Final   Enterobacterales NOT DETECTED NOT DETECTED Final   Enterobacter cloacae complex NOT DETECTED NOT DETECTED Final   Escherichia coli NOT DETECTED NOT DETECTED Final   Klebsiella aerogenes NOT DETECTED NOT DETECTED Final   Klebsiella  oxytoca NOT DETECTED NOT DETECTED Final   Klebsiella pneumoniae NOT DETECTED NOT DETECTED Final   Proteus species NOT DETECTED NOT DETECTED Final   Salmonella species NOT DETECTED NOT DETECTED Final   Serratia marcescens NOT DETECTED NOT DETECTED Final   Haemophilus influenzae NOT DETECTED NOT DETECTED Final   Neisseria meningitidis NOT DETECTED NOT DETECTED Final   Pseudomonas aeruginosa NOT DETECTED NOT DETECTED Final   Stenotrophomonas maltophilia NOT DETECTED NOT DETECTED Final   Candida albicans NOT DETECTED NOT DETECTED  Final   Candida auris NOT DETECTED NOT DETECTED Final   Candida glabrata NOT DETECTED NOT DETECTED Final   Candida krusei NOT DETECTED NOT DETECTED Final   Candida parapsilosis NOT DETECTED NOT DETECTED Final   Candida tropicalis NOT DETECTED NOT DETECTED Final   Cryptococcus neoformans/gattii NOT DETECTED NOT DETECTED Final   Methicillin resistance mecA/C DETECTED (A) NOT DETECTED Final    Comment: CRITICAL RESULT CALLED TO, READ BACK BY AND VERIFIED WITH: ALEX CHAPPELL @ 0814 ON 01/31/21.Marland KitchenMarland KitchenGM Performed at [facility], [address] [place], [place] [address]   Culture, Respiratory w Gram Stain     Status: None   Collection Time: 01/30/21 12:07 PM   Specimen: Tracheal Aspirate; Respiratory  Result Value Ref Range Status   Specimen Description   Final    TRACHEAL ASPIRATE Performed at [facility], [address], [place], [place] [address]    Special Requests   Final    NONE Performed at [facility], [address] [place], [place] [address]    Gram Stain   Final    FEW SQUAMOUS EPITHELIAL CELLS PRESENT MODERATE WBC SEEN FEW GRAM POSITIVE RODS MODERATE GRAM POSITIVE COCCI    Culture   Final    ABUNDANT Normal respiratory flora-no Staph aureus or Pseudomonas seen Performed at [facility], [place] [address] [place], [place] [address]    Report Status 02-Feb-2021 FINAL  Final    Coagulation Studies: Recent Labs     02/12/2021 1640  LABPROT 13.7  INR 1.1     Urinalysis: Recent Labs    01/26/2021 1810  COLORURINE YELLOW*  LABSPEC 1.022  PHURINE 5.0  GLUCOSEU >=500*  HGBUR NEGATIVE  BILIRUBINUR NEGATIVE  KETONESUR NEGATIVE  PROTEINUR NEGATIVE  NITRITE NEGATIVE  LEUKOCYTESUR NEGATIVE       Imaging: CT HEAD WO CONTRAST (5MM)  Result Date: 01/31/2021 CLINICAL DATA:  Mental status change, unknown cause EXAM: CT HEAD WITHOUT CONTRAST TECHNIQUE: Contiguous axial images were obtained from the base of the skull through the vertex without intravenous contrast. COMPARISON:  January 29, 2021. FINDINGS: Brain: New hypodensities in bilateral cerebellar hemispheres and right parieto-occipital cortex. No evidence of acute hemorrhage, mass lesion, midline shift, hydrocephalus, or extra-axial fluid collection. Moderate patchy white matter hypoattenuation, nonspecific but compatible with chronic microvascular ischemic disease. Vascular: No hyperdense vessel identified. Skull: No acute fracture. Sinuses/Orbits: Visualized sinuses are clear.  Unremarkable orbits. Other: No mastoid effusions. IMPRESSION: 1. New hypodensities in bilateral cerebellar hemispheres and right parieto-occipital cortex, concerning for acute infarcts. Recommend MRI to further evaluate. 2. Moderate chronic microvascular disease. Electronically Signed   By: Margaretha Sheffield M.D.   On: 01/31/2021 12:12   MR ANGIO HEAD WO CONTRAST  Addendum Date: 01/31/2021   ADDENDUM REPORT: 01/31/2021 16:47 Electronically Signed   By: Margaretha Sheffield M.D.   On: 01/31/2021 16:47   Result Date: 01/31/2021 CLINICAL DATA:  Neuro deficit, acute, stroke suspected EXAM: MRI HEAD WITHOUT CONTRAST MRA HEAD WITHOUT CONTRAST MRA NECK WITHOUT CONTRAST TECHNIQUE: Multiplanar, multiecho pulse sequences of the brain and surrounding structures were obtained without intravenous contrast. Angiographic images of the Circle of Willis were obtained using MRA technique  without intravenous contrast. Angiographic images of the neck were obtained using MRA technique without intravenous contrast. Carotid stenosis measurements (when applicable) are obtained utilizing NASCET criteria, using the distal internal carotid diameter as the denominator. COMPARISON:  None. FINDINGS: MRI HEAD FINDINGS Brain: Numerous small acute infarcts in bilateral cerebellar hemispheres, left middle cerebellar peduncle, pons (  punctate), frontal, parietal, and occipital lobes and posterior temporal lobes. Supratentorial infarcts involve cortex and white matter. Also, small acute infarcts in bilateral thalami and basal ganglia. Associated edema without mass effect. Generalized atrophy. Chronic microvascular ischemic disease. No acute hemorrhage, hydrocephalus, mass lesion, midline shift, or extra-axial fluid collection. Vascular: See below. Skull and upper cervical spine: Normal marrow signal. Sinuses/Orbits: Mild paranasal sinus mucosal thickening. Unremarkable orbits. Other: No mastoid effusions. MRA HEAD FINDINGS Motion limited study. Anterior circulation: Atherosclerotic narrowing of the intracranial ICAs without evidence of high-grade stenosis. Bilateral MCAs and ACAs are patent. Probably moderate left M1 MCA stenosis. Posterior circulation: Right dominant. 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 Bilateral PCAs are patent without high-grade stenosis. Left posterior communicating artery. MRA NECK FINDINGS Unfortunately, the neck vasculature was only imaged from at approximate level of the carotid bifurcations superiorly. The common carotid arteries and proximal vertebral arteries were not imaged and the right carotid bifurcation was incompletely imaged. Probably severe stenosis of the left ICA origin with preserved flow related signal in the more distal ICA. The right common carotid bifurcation is incompletely imaged with possible stenosis versus artifact. The visualized  vertebral arteries are patent without visible high-grade stenosis. IMPRESSION: MRI head: Numerous small supratentorial and infratentorial infarcts, detailed above and potentially embolic and/or watershed in etiology given distribution. Associated edema without mass effect. MRA head: 1. Motion limited without large vessel occlusion. 2. 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 3. Probably moderate left M1 MCA stenosis. MRA neck: 1. Unfortunately, the common carotid arteries and vertebral arteries inferior to the carotid bifurcation were not imaged. 2. Probably severe stenosis of the left ICA origin with preserved flow related signal in the more distal ICA. Incompletely imaged right carotid bifurcation with possible stenosis versus artifact. 3. Given the above findings and limitations, recommend CTA of the neck to fully evaluate the neck vasculature and to quantify ICA stenosis. These results will be called to the ordering clinician or representative by the Radiologist Assistant, and communication documented in the PACS or [employer]. Electronically Signed: By: Margaretha Sheffield M.D. On: 01/31/2021 16:33   MR ANGIO NECK WO CONTRAST  Addendum Date: 01/31/2021   ADDENDUM REPORT: 01/31/2021 16:47 Electronically Signed   By: Margaretha Sheffield M.D.   On: 01/31/2021 16:47   Result Date: 01/31/2021 CLINICAL DATA:  Neuro deficit, acute, stroke suspected EXAM: MRI HEAD WITHOUT CONTRAST MRA HEAD WITHOUT CONTRAST MRA NECK WITHOUT CONTRAST TECHNIQUE: Multiplanar, multiecho pulse sequences of the brain and surrounding structures were obtained without intravenous contrast. Angiographic images of the Circle of Willis were obtained using MRA technique without intravenous contrast. Angiographic images of the neck were obtained using MRA technique without intravenous contrast. Carotid stenosis measurements (when applicable) are obtained utilizing NASCET criteria, using the distal internal  carotid diameter as the denominator. COMPARISON:  None. FINDINGS: MRI HEAD FINDINGS Brain: Numerous small acute infarcts in bilateral cerebellar hemispheres, left middle cerebellar peduncle, pons (punctate), frontal, parietal, and occipital lobes and posterior temporal lobes. Supratentorial infarcts involve cortex and white matter. Also, small acute infarcts in bilateral thalami and basal ganglia. Associated edema without mass effect. Generalized atrophy. Chronic microvascular ischemic disease. No acute hemorrhage, hydrocephalus, mass lesion, midline shift, or extra-axial fluid collection. Vascular: See below. Skull and upper cervical spine: Normal marrow signal. Sinuses/Orbits: Mild paranasal sinus mucosal thickening. Unremarkable orbits. Other: No mastoid effusions. MRA HEAD FINDINGS Motion limited study. Anterior circulation: Atherosclerotic narrowing of the intracranial ICAs without evidence of high-grade stenosis.  Bilateral MCAs and ACAs are patent. Probably moderate left M1 MCA stenosis. Posterior circulation: Right dominant. 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 Bilateral PCAs are patent without high-grade stenosis. Left posterior communicating artery. MRA NECK FINDINGS Unfortunately, the neck vasculature was only imaged from at approximate level of the carotid bifurcations superiorly. The common carotid arteries and proximal vertebral arteries were not imaged and the right carotid bifurcation was incompletely imaged. Probably severe stenosis of the left ICA origin with preserved flow related signal in the more distal ICA. The right common carotid bifurcation is incompletely imaged with possible stenosis versus artifact. The visualized vertebral arteries are patent without visible high-grade stenosis. IMPRESSION: MRI head: Numerous small supratentorial and infratentorial infarcts, detailed above and potentially embolic and/or watershed in etiology given distribution.  Associated edema without mass effect. MRA head: 1. Motion limited without large vessel occlusion. 2. 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 3. Probably moderate left M1 MCA stenosis. MRA neck: 1. Unfortunately, the common carotid arteries and vertebral arteries inferior to the carotid bifurcation were not imaged. 2. Probably severe stenosis of the left ICA origin with preserved flow related signal in the more distal ICA. Incompletely imaged right carotid bifurcation with possible stenosis versus artifact. 3. Given the above findings and limitations, recommend CTA of the neck to fully evaluate the neck vasculature and to quantify ICA stenosis. These results will be called to the ordering clinician or representative by the Radiologist Assistant, and communication documented in the PACS or [employer]. Electronically Signed: By: Margaretha Sheffield M.D. On: 01/31/2021 16:33   MR BRAIN WO CONTRAST  Addendum Date: 01/31/2021   ADDENDUM REPORT: 01/31/2021 16:47 Electronically Signed   By: Margaretha Sheffield M.D.   On: 01/31/2021 16:47   Result Date: 01/31/2021 CLINICAL DATA:  Neuro deficit, acute, stroke suspected EXAM: MRI HEAD WITHOUT CONTRAST MRA HEAD WITHOUT CONTRAST MRA NECK WITHOUT CONTRAST TECHNIQUE: Multiplanar, multiecho pulse sequences of the brain and surrounding structures were obtained without intravenous contrast. Angiographic images of the Circle of Willis were obtained using MRA technique without intravenous contrast. Angiographic images of the neck were obtained using MRA technique without intravenous contrast. Carotid stenosis measurements (when applicable) are obtained utilizing NASCET criteria, using the distal internal carotid diameter as the denominator. COMPARISON:  None. FINDINGS: MRI HEAD FINDINGS Brain: Numerous small acute infarcts in bilateral cerebellar hemispheres, left middle cerebellar peduncle, pons (punctate), frontal, parietal, and occipital  lobes and posterior temporal lobes. Supratentorial infarcts involve cortex and white matter. Also, small acute infarcts in bilateral thalami and basal ganglia. Associated edema without mass effect. Generalized atrophy. Chronic microvascular ischemic disease. No acute hemorrhage, hydrocephalus, mass lesion, midline shift, or extra-axial fluid collection. Vascular: See below. Skull and upper cervical spine: Normal marrow signal. Sinuses/Orbits: Mild paranasal sinus mucosal thickening. Unremarkable orbits. Other: No mastoid effusions. MRA HEAD FINDINGS Motion limited study. Anterior circulation: Atherosclerotic narrowing of the intracranial ICAs without evidence of high-grade stenosis. Bilateral MCAs and ACAs are patent. Probably moderate left M1 MCA stenosis. Posterior circulation: Right dominant. 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 Bilateral PCAs are patent without high-grade stenosis. Left posterior communicating artery. MRA NECK FINDINGS Unfortunately, the neck vasculature was only imaged from at approximate level of the carotid bifurcations superiorly. The common carotid arteries and proximal vertebral arteries were not imaged and the right carotid bifurcation was incompletely imaged. Probably severe stenosis of the left ICA origin with preserved flow related signal in the more distal ICA.  The right common carotid bifurcation is incompletely imaged with possible stenosis versus artifact. The visualized vertebral arteries are patent without visible high-grade stenosis. IMPRESSION: MRI head: Numerous small supratentorial and infratentorial infarcts, detailed above and potentially embolic and/or watershed in etiology given distribution. Associated edema without mass effect. MRA head: 1. Motion limited without large vessel occlusion. 2. 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 3. Probably moderate left M1 MCA stenosis. MRA neck: 1.  Unfortunately, the common carotid arteries and vertebral arteries inferior to the carotid bifurcation were not imaged. 2. Probably severe stenosis of the left ICA origin with preserved flow related signal in the more distal ICA. Incompletely imaged right carotid bifurcation with possible stenosis versus artifact. 3. Given the above findings and limitations, recommend CTA of the neck to fully evaluate the neck vasculature and to quantify ICA stenosis. These results will be called to the ordering clinician or representative by the Radiologist Assistant, and communication documented in the PACS or [employer]. Electronically Signed: By: Margaretha Sheffield M.D. On: 01/31/2021 16:33   US RENAL  Result Date: 01/31/2021 CLINICAL DATA:  Acute renal failure. EXAM: RENAL / URINARY TRACT ULTRASOUND COMPLETE COMPARISON:  June 30, 2015 FINDINGS: Right Kidney: Renal measurements: 11.1 cm x 4.3 cm x 5.0 cm = volume: 124 mL. Echogenicity within normal limits. No mass or hydronephrosis visualized. Left Kidney: Renal measurements: 10.7 cm x 4.4 cm x 3.8 cm = volume: 94 mL. Echogenicity within normal limits. No mass or hydronephrosis visualized. Bladder: A Foley catheter is in place. Other: None. IMPRESSION: Normal renal ultrasound. Electronically Signed   By: Virgina Norfolk M.D.   On: 01/31/2021 14:57   DG Hand 2 View Right  Result Date: 01/31/2021 CLINICAL DATA:  MRI clearance EXAM: RIGHT HAND - 2 VIEW COMPARISON:  None. FINDINGS: There is no evidence of fracture or dislocation. Mild degenerative changes of the thumb IP joint. Vascular calcifications. IMPRESSION: No metallic foreign bodies identified. Electronically Signed   By: Yetta Glassman M.D.   On: 01/31/2021 14:34   US Venous Img Lower Bilateral (DVT)  Result Date: 01/30/2021 CLINICAL DATA:  Bilateral lower extremity edema.  Evaluate for DVT. EXAM: BILATERAL LOWER EXTREMITY VENOUS DOPPLER ULTRASOUND TECHNIQUE: Gray-scale sonography with graded  compression, as well as color Doppler and duplex ultrasound were performed to evaluate the lower extremity deep venous systems from the level of the common femoral vein and including the common femoral, femoral, profunda femoral, popliteal and calf veins including the posterior tibial, peroneal and gastrocnemius veins when visible. The superficial great saphenous vein was also interrogated. Spectral Doppler was utilized to evaluate flow at rest and with distal augmentation maneuvers in the common femoral, femoral and popliteal veins. COMPARISON:  None. FINDINGS: RIGHT LOWER EXTREMITY Common Femoral Vein: No evidence of thrombus. Normal compressibility, respiratory phasicity and response to augmentation. Saphenofemoral Junction: No evidence of thrombus. Normal compressibility and flow on color Doppler imaging. Profunda Femoral Vein: No evidence of thrombus. Normal compressibility and flow on color Doppler imaging. Femoral Vein: No evidence of thrombus. Normal compressibility, respiratory phasicity and response to augmentation. Popliteal Vein: No evidence of thrombus. Normal compressibility, respiratory phasicity and response to augmentation. Calf Veins: No evidence of thrombus. Normal compressibility and flow on color Doppler imaging. Superficial Great Saphenous Vein: No evidence of thrombus. Normal compressibility. Venous Reflux:  None. Other Findings:  None. LEFT LOWER EXTREMITY Common Femoral Vein: No evidence of thrombus. Normal compressibility, respiratory phasicity and response to augmentation. Saphenofemoral Junction: No evidence of thrombus. Normal  compressibility and flow on color Doppler imaging. Profunda Femoral Vein: No evidence of thrombus. Normal compressibility and flow on color Doppler imaging. Femoral Vein: No evidence of thrombus. Normal compressibility, respiratory phasicity and response to augmentation. Popliteal Vein: No evidence of thrombus. Normal compressibility, respiratory phasicity and  response to augmentation. Calf Veins: No evidence of thrombus. Normal compressibility and flow on color Doppler imaging. Superficial Great Saphenous Vein: No evidence of thrombus. Normal compressibility. Venous Reflux:  None. Other Findings:  None. IMPRESSION: No evidence of DVT within either lower extremity. Electronically Signed   By: Sandi Mariscal M.D.   On: 01/30/2021 14:26   DG Chest Port 1 View  Result Date: 01/31/2021 CLINICAL DATA:  Acute respiratory failure with hypoxia EXAM: PORTABLE CHEST 1 VIEW COMPARISON:  Chest radiograph dated January 30, 2021 FINDINGS: Stable mild cardiomegaly. Diffuse mild interstitial opacities concerning for pulmonary edema, unchanged. No large pleural effusion. Endotracheal tube, feeding tube and right IJ access central line are unchanged. IMPRESSION: 1.  Stable cardiomegaly with mild pulmonary vascular congestion. 2.  Lines and tubes are unchanged. Electronically Signed   By: Keane Police D.O.   On: 01/31/2021 08:40   ECHOCARDIOGRAM COMPLETE  Result Date: 01/30/2021    ECHOCARDIOGRAM REPORT   Patient Name:   TICO CROTTEAU Date of Exam: 01/30/2021 Medical Rec #:  [id number]          Height:       66.0 in Accession #:    [id number]         Weight:       194.7 lb Date of Birth:  [birth year]          BSA:          1.977 m Patient Age:    1 years           BP:           100/65 mmHg Patient Gender: M                  HR:           82 bpm. Exam Location:  [facility] Procedure: 2D Echo, Color Doppler, Cardiac Doppler and Intracardiac            Opacification Agent Indications:     I24.9 Acute ischemic heart disease, unspecified  History:         Patient has prior history of Echocardiogram examinations, most                  recent 07/15/2019. CAD and Previous Myocardial Infarction, CKD                  and COPD; Risk Factors:Diabetes.  Sonographer:     Charmayne Sheer Referring Phys:  [id number] [address] END Diagnosing Phys: Kate Sable MD  Sonographer Comments: Echo performed with  patient supine and on artificial respirator, suboptimal parasternal window and suboptimal subcostal window. IMPRESSIONS  1. Left ventricular ejection fraction, by estimation, is 25 to 30%. Left ventricular ejection fraction by 2D MOD biplane is 26.0 %. The left ventricle has severely decreased function. The left ventricle demonstrates global hypokinesis. Left ventricular diastolic parameters are consistent with Grade II diastolic dysfunction (pseudonormalization).  2. Right ventricular systolic function is low normal. The right ventricular size is not well visualized.  3. Left atrial size was mildly dilated.  4. The mitral valve is grossly normal. Mild to moderate mitral valve regurgitation.  5. Tricuspid valve regurgitation is mild to moderate.  6. The aortic  valve was not well visualized. Aortic valve regurgitation is not visualized. FINDINGS  Left Ventricle: Left ventricular ejection fraction, by estimation, is 25 to 30%. Left ventricular ejection fraction by 2D MOD biplane is 26.0 %. The left ventricle has severely decreased function. The left ventricle demonstrates global hypokinesis. Definity contrast agent was given IV to delineate the left ventricular endocardial borders. The left ventricular internal cavity size was normal in size. There is no left ventricular hypertrophy. Left ventricular diastolic parameters are consistent with Grade II diastolic dysfunction (pseudonormalization). Right Ventricle: The right ventricular size is not well visualized. No increase in right ventricular wall thickness. Right ventricular systolic function is low normal. Left Atrium: Left atrial size was mildly dilated. Right Atrium: Right atrial size was normal in size. Pericardium: There is no evidence of pericardial effusion. Mitral Valve: The mitral valve is grossly normal. Mild to moderate mitral valve regurgitation. MV peak gradient, 4.4 mmHg. The mean mitral valve gradient is 2.0 mmHg. Tricuspid Valve: The tricuspid valve  is grossly normal. Tricuspid valve regurgitation is mild to moderate. Aortic Valve: The aortic valve was not well visualized. Aortic valve regurgitation is not visualized. Aortic valve mean gradient measures 9.0 mmHg. Aortic valve peak gradient measures 15.7 mmHg. Aortic valve area, by VTI measures 1.90 cm. Pulmonic Valve: The pulmonic valve was not well visualized. Pulmonic valve regurgitation is not visualized. Aorta: The aortic root is normal in size and structure. Venous: IVC assessment for right atrial pressure unable to be performed due to mechanical ventilation. IAS/Shunts: No atrial level shunt detected by color flow Doppler.  LEFT VENTRICLE PLAX 2D                        Biplane EF (MOD) LVOT diam:     2.30 cm         LV Biplane EF:   Left LV SV:         49                               ventricular LV SV Index:   25                               ejection LVOT Area:     4.15 cm                         fraction by                                                 2D MOD                                                 biplane is LV Volumes (MOD)                                26.0 %. LV vol d, MOD    178.0 ml A2C:  Diastology LV vol d, MOD    204.0 ml      LV e' medial:    4.90 cm/s A4C:                           LV E/e' medial:  21.8 LV vol s, MOD    135.0 ml      LV e' lateral:   5.87 cm/s A2C:                           LV E/e' lateral: 18.2 LV vol s, MOD    155.0 ml A4C: LV SV MOD A2C:   43.0 ml LV SV MOD A4C:   204.0 ml LV SV MOD BP:    50.6 ml RIGHT VENTRICLE RV Basal diam:  3.10 cm RV S prime:     9.57 cm/s LEFT ATRIUM             Index        RIGHT ATRIUM           Index LA Vol (A2C):   86.4 ml 43.70 ml/m  RA Area:     20.00 cm LA Vol (A4C):   61.5 ml 31.11 ml/m  RA Volume:   55.00 ml  27.82 ml/m LA Biplane Vol: 73.1 ml 36.98 ml/m  AORTIC VALVE AV Area (Vmax):    1.92 cm AV Area (Vmean):   1.75 cm AV Area (VTI):     1.90 cm AV Vmax:           198.00 cm/s AV Vmean:           136.000 cm/s AV VTI:            0.258 m AV Peak Grad:      15.7 mmHg AV Mean Grad:      9.0 mmHg LVOT Vmax:         91.40 cm/s LVOT Vmean:        57.300 cm/s LVOT VTI:          0.118 m LVOT/AV VTI ratio: 0.46  AORTA Ao Root diam: 3.20 cm MITRAL VALVE MV Area (PHT): 4.26 cm     SHUNTS MV Area VTI:   1.79 cm     Systemic VTI:  0.12 m MV Peak grad:  4.4 mmHg     Systemic Diam: 2.30 cm MV Mean grad:  2.0 mmHg MV Vmax:       1.05 m/s MV Vmean:      58.5 cm/s MV Decel Time: 178 msec MV E velocity: 107.00 cm/s MV A velocity: 75.40 cm/s MV E/A ratio:  1.42 Kate Sable MD Electronically signed by Kate Sable MD Signature Date/Time: 01/30/2021/2:11:13 PM    Final      Medications:    sodium chloride     cefTRIAXone (ROCEPHIN)  IV Stopped (01/31/21 1102)   feeding supplement (VITAL HIGH PROTEIN) 1,000 mL (01/31/21 1704)   norepinephrine (LEVOPHED) Adult infusion 12 mcg/min (06-Feb-2021 0747)   propofol (DIPRIVAN) infusion 20 mcg/kg/min (02-06-21 0747)     stroke: mapping our early stages of recovery book   Does not apply Once   aspirin  81 mg Per Tube Daily   atorvastatin  80 mg Per Tube QHS   azithromycin  500 mg Per Tube Daily   chlorhexidine gluconate (MEDLINE KIT)  15 mL Mouth Rinse BID   Chlorhexidine Gluconate Cloth  6 each Topical Q0600  clopidogrel  75 mg Per Tube Daily   docusate  100 mg Per Tube BID   ezetimibe  10 mg Per Tube Daily   feeding supplement (PROSource TF)  90 mL Per Tube BID   finasteride  5 mg Oral Daily   free water  30 mL Per Tube Q4H   furosemide  40 mg Intravenous Daily   heparin injection (subcutaneous)  5,000 Units Subcutaneous Q8H   insulin aspart  0-15 Units Subcutaneous Q4H   mouth rinse  15 mL Mouth Rinse 10 times per day   pantoprazole (PROTONIX) IV  40 mg Intravenous Q24H   polyethylene glycol  17 g Per Tube Daily   sodium chloride flush  3 mL Intravenous Q12H   sodium chloride, acetaminophen, fentaNYL (SUBLIMAZE) injection, fentaNYL (SUBLIMAZE)  injection, ipratropium-albuterol, midazolam, ondansetron (ZOFRAN) IV, sodium chloride flush  Assessment/ Plan:  [name] is a 75 y.o. black male with hypertension, diabetes mellitus type II, congestive heart failure, coronary artery disease status post CABG, hyperlipidemia and BPH who is admitted to [facility] on 01/21/2021 for Cardiogenic shock ([place]) [R57.0] Acute respiratory failure with hypoxia ([place]) [J96.01] Unresponsive state [R41.89] ST elevation myocardial infarction (STEMI) involving other coronary artery of inferior wall (H[facility]) [I21.19] ST elevation myocardial infarction (STEMI), unspecified artery ([place]) [I21.3]  Acute kidney injury on chronic kidney disease stage IIIB: baseline creatinine of 2.34, GFR of 28 on 11/21/2020. Chronic kidney disease secondary to diabetic nephropathy and hypertension. Acute kidney injury secondary to acute cardiorenal syndrome, IV contrast exposure and possible ATN. Responding to IV furosemide. No obstruction on ultrasound - Holding home empagliflozin, spironolactone, and losartan.  - IV furosemide 87m daily   - No indication for renal replacement therapy.   Hypotension with cardiogenic shock: requiring vasopressors: norepinephrine. Appreciate cardiology and critical care input. Echocardiogram with EF of 25-30% and grade II diastolic dysfunction - holding beta blocker - appreciate cardiology input.   Diabetes mellitus type II with chronic kidney disease: patient should be off metformin as an outpatient. With history of glycosuria. No history of proteinuria.  - Continue glucose control   LOS: 3 Camdan Burdi 1December 17, 20220:57 AM

## 2021-02-14 DEATH — deceased

## 2021-10-25 ENCOUNTER — Ambulatory Visit: Payer: Medicare HMO
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
